# Patient Record
Sex: Female | Born: 1955 | Race: White | Hispanic: No | Smoking: Never smoker
Health system: Southern US, Community
[De-identification: ages and names within clinical notes are randomized; demographics above are authoritative.]

## PROBLEM LIST (undated history)

## (undated) DIAGNOSIS — F32A Depression, unspecified: Secondary | ICD-10-CM

## (undated) DIAGNOSIS — I10 Essential (primary) hypertension: Secondary | ICD-10-CM

## (undated) DIAGNOSIS — E079 Disorder of thyroid, unspecified: Secondary | ICD-10-CM

## (undated) DIAGNOSIS — IMO0002 Reserved for concepts with insufficient information to code with codable children: Secondary | ICD-10-CM

## (undated) DIAGNOSIS — T7840XA Allergy, unspecified, initial encounter: Secondary | ICD-10-CM

## (undated) DIAGNOSIS — F329 Major depressive disorder, single episode, unspecified: Secondary | ICD-10-CM

## (undated) DIAGNOSIS — F419 Anxiety disorder, unspecified: Secondary | ICD-10-CM

## (undated) DIAGNOSIS — E039 Hypothyroidism, unspecified: Secondary | ICD-10-CM

## (undated) DIAGNOSIS — K219 Gastro-esophageal reflux disease without esophagitis: Secondary | ICD-10-CM

## (undated) HISTORY — DX: Allergy, unspecified, initial encounter: T78.40XA

## (undated) HISTORY — DX: Depression, unspecified: F32.A

## (undated) HISTORY — DX: Major depressive disorder, single episode, unspecified: F32.9

## (undated) HISTORY — DX: Anxiety disorder, unspecified: F41.9

## (undated) HISTORY — PX: WRIST SURGERY: SHX841

## (undated) HISTORY — PX: TUBAL LIGATION: SHX77

## (undated) HISTORY — DX: Disorder of thyroid, unspecified: E07.9

## (undated) HISTORY — PX: ABDOMINAL HYSTERECTOMY: SHX81

---

## 2001-07-13 ENCOUNTER — Ambulatory Visit (HOSPITAL_COMMUNITY): Admission: RE | Admit: 2001-07-13 | Discharge: 2001-07-13 | Payer: Self-pay | Admitting: Family Medicine

## 2001-07-13 ENCOUNTER — Encounter: Payer: Self-pay | Admitting: Family Medicine

## 2002-02-04 ENCOUNTER — Emergency Department (HOSPITAL_COMMUNITY): Admission: EM | Admit: 2002-02-04 | Discharge: 2002-02-04 | Payer: Self-pay

## 2003-07-04 ENCOUNTER — Ambulatory Visit (HOSPITAL_COMMUNITY): Admission: RE | Admit: 2003-07-04 | Discharge: 2003-07-04 | Payer: Self-pay | Admitting: Family Medicine

## 2003-07-04 ENCOUNTER — Encounter: Payer: Self-pay | Admitting: Family Medicine

## 2003-11-26 ENCOUNTER — Emergency Department (HOSPITAL_COMMUNITY): Admission: EM | Admit: 2003-11-26 | Discharge: 2003-11-26 | Payer: Self-pay | Admitting: Emergency Medicine

## 2003-12-06 ENCOUNTER — Emergency Department (HOSPITAL_COMMUNITY): Admission: EM | Admit: 2003-12-06 | Discharge: 2003-12-06 | Payer: Self-pay | Admitting: Emergency Medicine

## 2004-07-22 ENCOUNTER — Ambulatory Visit (HOSPITAL_COMMUNITY): Admission: RE | Admit: 2004-07-22 | Discharge: 2004-07-22 | Payer: Self-pay | Admitting: Family Medicine

## 2004-10-04 ENCOUNTER — Emergency Department (HOSPITAL_COMMUNITY): Admission: EM | Admit: 2004-10-04 | Discharge: 2004-10-05 | Payer: Self-pay | Admitting: *Deleted

## 2004-11-11 ENCOUNTER — Ambulatory Visit (HOSPITAL_COMMUNITY): Admission: RE | Admit: 2004-11-11 | Discharge: 2004-11-11 | Payer: Self-pay | Admitting: Family Medicine

## 2005-02-03 ENCOUNTER — Other Ambulatory Visit: Admission: RE | Admit: 2005-02-03 | Discharge: 2005-02-03 | Payer: Self-pay | Admitting: Obstetrics and Gynecology

## 2005-02-19 ENCOUNTER — Ambulatory Visit (HOSPITAL_COMMUNITY): Admission: RE | Admit: 2005-02-19 | Discharge: 2005-02-19 | Payer: Self-pay | Admitting: Obstetrics and Gynecology

## 2005-09-15 ENCOUNTER — Ambulatory Visit (HOSPITAL_COMMUNITY): Admission: RE | Admit: 2005-09-15 | Discharge: 2005-09-15 | Payer: Self-pay | Admitting: Internal Medicine

## 2005-09-15 ENCOUNTER — Ambulatory Visit: Payer: Self-pay | Admitting: Internal Medicine

## 2006-04-03 ENCOUNTER — Emergency Department (HOSPITAL_COMMUNITY): Admission: EM | Admit: 2006-04-03 | Discharge: 2006-04-03 | Payer: Self-pay | Admitting: Emergency Medicine

## 2006-04-12 ENCOUNTER — Encounter (INDEPENDENT_AMBULATORY_CARE_PROVIDER_SITE_OTHER): Payer: Self-pay | Admitting: Specialist

## 2006-04-12 ENCOUNTER — Inpatient Hospital Stay (HOSPITAL_COMMUNITY): Admission: RE | Admit: 2006-04-12 | Discharge: 2006-04-13 | Payer: Self-pay | Admitting: Obstetrics and Gynecology

## 2006-09-13 ENCOUNTER — Ambulatory Visit (HOSPITAL_COMMUNITY): Admission: RE | Admit: 2006-09-13 | Discharge: 2006-09-13 | Payer: Self-pay | Admitting: Family Medicine

## 2007-02-15 ENCOUNTER — Encounter (HOSPITAL_COMMUNITY): Admission: RE | Admit: 2007-02-15 | Discharge: 2007-03-17 | Payer: Self-pay | Admitting: Family Medicine

## 2007-03-01 ENCOUNTER — Ambulatory Visit (HOSPITAL_COMMUNITY): Admission: RE | Admit: 2007-03-01 | Discharge: 2007-03-01 | Payer: Self-pay | Admitting: Family Medicine

## 2007-03-23 ENCOUNTER — Encounter (HOSPITAL_COMMUNITY): Admission: RE | Admit: 2007-03-23 | Discharge: 2007-04-22 | Payer: Self-pay | Admitting: Family Medicine

## 2007-05-05 ENCOUNTER — Ambulatory Visit (HOSPITAL_COMMUNITY): Admission: RE | Admit: 2007-05-05 | Discharge: 2007-05-05 | Payer: Self-pay | Admitting: Family Medicine

## 2007-11-30 ENCOUNTER — Ambulatory Visit (HOSPITAL_COMMUNITY): Admission: RE | Admit: 2007-11-30 | Discharge: 2007-11-30 | Payer: Self-pay | Admitting: Family Medicine

## 2007-12-28 ENCOUNTER — Emergency Department (HOSPITAL_COMMUNITY): Admission: EM | Admit: 2007-12-28 | Discharge: 2007-12-28 | Payer: Self-pay | Admitting: Emergency Medicine

## 2008-01-19 ENCOUNTER — Ambulatory Visit (HOSPITAL_COMMUNITY): Admission: RE | Admit: 2008-01-19 | Discharge: 2008-01-19 | Payer: Self-pay | Admitting: Family Medicine

## 2009-01-31 ENCOUNTER — Ambulatory Visit (HOSPITAL_COMMUNITY): Admission: RE | Admit: 2009-01-31 | Discharge: 2009-01-31 | Payer: Self-pay | Admitting: Obstetrics and Gynecology

## 2009-07-06 ENCOUNTER — Emergency Department (HOSPITAL_COMMUNITY): Admission: EM | Admit: 2009-07-06 | Discharge: 2009-07-07 | Payer: Self-pay | Admitting: Emergency Medicine

## 2009-08-15 ENCOUNTER — Ambulatory Visit (HOSPITAL_COMMUNITY): Admission: RE | Admit: 2009-08-15 | Discharge: 2009-08-15 | Payer: Self-pay | Admitting: Family Medicine

## 2009-11-14 ENCOUNTER — Ambulatory Visit (HOSPITAL_COMMUNITY): Admission: RE | Admit: 2009-11-14 | Discharge: 2009-11-14 | Payer: Self-pay | Admitting: Family Medicine

## 2010-03-04 ENCOUNTER — Emergency Department (HOSPITAL_COMMUNITY): Admission: EM | Admit: 2010-03-04 | Discharge: 2010-03-04 | Payer: Self-pay | Admitting: Emergency Medicine

## 2010-04-10 ENCOUNTER — Ambulatory Visit (HOSPITAL_COMMUNITY)
Admission: RE | Admit: 2010-04-10 | Discharge: 2010-04-10 | Payer: Self-pay | Source: Home / Self Care | Admitting: Family Medicine

## 2010-10-03 ENCOUNTER — Ambulatory Visit (HOSPITAL_COMMUNITY)
Admission: RE | Admit: 2010-10-03 | Discharge: 2010-10-03 | Payer: Self-pay | Source: Home / Self Care | Attending: Family Medicine | Admitting: Family Medicine

## 2010-10-19 ENCOUNTER — Encounter: Payer: Self-pay | Admitting: Family Medicine

## 2010-11-26 ENCOUNTER — Encounter: Payer: Self-pay | Admitting: Orthopedic Surgery

## 2010-12-10 ENCOUNTER — Ambulatory Visit: Payer: Self-pay | Admitting: Orthopedic Surgery

## 2010-12-15 LAB — POCT CARDIAC MARKERS
CKMB, poc: 1.6 ng/mL (ref 1.0–8.0)
Troponin i, poc: <0.05 ng/mL (ref 0.00–0.09)
Troponin i, poc: <0.05 ng/mL (ref 0.00–0.09)

## 2010-12-15 LAB — BASIC METABOLIC PANEL
BUN: 12 mg/dL (ref 6–23)
CO2: 26 mEq/L (ref 19–32)
Chloride: 103 mEq/L (ref 96–112)
Creatinine, Ser: 0.64 mg/dL (ref 0.4–1.2)
Potassium: 3 mEq/L — ABNORMAL LOW (ref 3.5–5.1)

## 2010-12-15 LAB — D-DIMER, QUANTITATIVE: D-Dimer, Quant: 0.42 ug/mL-FEU (ref 0.00–0.48)

## 2010-12-15 LAB — DIFFERENTIAL
Basophils Relative: 1 % (ref 0–1)
Eosinophils Absolute: 0.1 10*3/uL (ref 0.0–0.7)
Eosinophils Relative: 2 % (ref 0–5)
Lymphs Abs: 2.2 10*3/uL (ref 0.7–4.0)
Monocytes Relative: 7 % (ref 3–12)
Neutrophils Relative %: 64 % (ref 43–77)

## 2010-12-15 LAB — CBC
HCT: 38.1 % (ref 36.0–46.0)
MCHC: 34.2 g/dL (ref 30.0–36.0)
MCV: 86.1 fL (ref 78.0–100.0)
Platelets: 285 10*3/uL (ref 150–400)
WBC: 8.3 10*3/uL (ref 4.0–10.5)

## 2011-02-10 NOTE — Procedures (Signed)
NAMEMEGANNE, RITA NO.:  [id number]   MEDICAL RECORD NO.:  [id number]          PATIENT TYPE:  OUT   LOCATION:  DFTL                          FACILITY:  [facility]   PHYSICIAN:  Donna Bernard, M.D.DATE OF BIRTH:  05/04/1956   DATE OF PROCEDURE:  01/19/2008  DATE OF DISCHARGE:  01/19/2008                                  STRESS TEST   INDICATION FOR TEST:  This patient is a 55 year old white female with a  history of hypertension who has had some atypical chest discomfort.  Stress test was done at standard [facility] protocol.  Resting EKG revealed  normal sinus rhythm with no significant ST-T changes.  The patient  tolerated the tests relatively well.  After the first stage, the patient  had a slight shortness of breath.  There were no significant ST-segment  changes at this point.  By the completion of the third stage, the  patient was feeling short of breath.   INTERPRETATION:  The EKG is somewhat challenged by some artifact;  however, utilizing multiple EKGs, we did see enough of the ST-segment to  feel confident of our results.  The patient reached a maximum heart rate  of 150.  This surpassed with sub-max predicted heart rate of 142.  At  0.08 seconds passage from the J-point, there was only nonspecific ST  segments changes noted with depression, generally less than a  millimeter.  The ST-segment remained sharply ascending in these areas  too.   IMPRESSION:  Negative adequate stress test.   PLAN:  The patient is encouraged to get into some regular exercise and  followup in the office for other chronic concerns.      Donna Bernard, M.D.  Electronically Signed     WSL/MEDQ  D:  02/22/2008  T:  02/23/2008  Job:  [id number]

## 2011-02-13 NOTE — H&P (Signed)
Jessica Kirby               ACCOUNT NO.:  [id number]   MEDICAL RECORD NO.:  [id number]          PATIENT TYPE:  AMB   LOCATION:  DAY                           FACILITY:  [facility]   PHYSICIAN:  Tilda Burrow, M.D. DATE OF BIRTH:  1956/05/03   DATE OF ADMISSION:  DATE OF DISCHARGE:  LH                                HISTORY & PHYSICAL   ADMITTING DIAGNOSES:  1.  Heavy and prolonged menses.  2.  First degree uterine descensus, uterine retroversion.  3.  Requesting endometrial ablation.   HISTORY OF PRESENT ILLNESS:  This 55 year old female is still premenopausal  is admitted for endometrial ablation.  Jessica Kirby has been seen in our office.  She was referred courtesy of Eber Jones  _ of [facility] for  evaluation for heavy bleeding and anemia secondary to the heavy bleeding.  She had a uterus that on ultrasound measured 8.3 x 4.7 x 5.3-cm, with a 5-mm  endometrial stripe, no focal uterine lesions.  An endometrial biopsy was  performed that shows benign endometrial tissue, no hyperplasia or  malignancy.  We have seen her, evaluated her, and discussed treatment  options including the option of doing nothing, attempting hormone therapy,  as well as endometrial ablation.  The vaginal hysterectomy was also reviewed  and the patient's preferred method of therapy at this time is endometrial  ablation.  She had been using a CombiPatch in a continuous fashion in the  past without optimal success.  She has a documented hemoglobin of 10.1 while  on iron therapy, currently it is improved to 11.1.   PAST MEDICAL HISTORY:  Benign.   SURGICAL HISTORY:  Tubal ligation.   INJURIES:  Broken wrist, 1999.   ALLERGIES:  1.  SULFA DRUGS.  2.  HCTZ.  3.  FLUID PILL.  4.  Prudy Feeler.   MEDICATIONS:  CombiPatch taken in the past, Actonel, and calcium are  currently being used.   PHYSICAL EXAMINATION:  VITAL SIGNS:  Height 5 foot 2 inches, weight 170.  GENERAL:  Shows a healthy-appearing  Caucasian female, alert, oriented x 3.  HEENT:  Pupils equal, round, reactive.  Extraocular movements intact.  NECK:  Supple.  Trachea midline.  CARDIOVASCULAR:  Unremarkable.  ABDOMEN:  Soft without masses.  EXTERNAL GENITALIA:  Normal.  PELVIC:  Vaginal exam shows normal secretions.  Cervix:  Bulbous,  multiparous.  Uterus is retroverted.  Sounds to 8-cm on the endometrial  biopsy with heavy bleeding noted upon procedure.   IMPRESSION:  1.  Heavy and prolonged menses.  2.  First degree uterine descensus.   PLAN:  1.  Hysteroscopy.  2.  Dilatation and curettage.  3.  Endometrial ablation, Feb 19, 2005.      JVF/MEDQ  D:  02/18/2005  T:  02/18/2005  Job:  [id number]   cc:   Tilda Burrow, M.D.  Fax: [phone]

## 2011-02-13 NOTE — Op Note (Signed)
NAMEKRISLYN, Jessica Kirby               ACCOUNT NO.:  [id number]   MEDICAL RECORD NO.:  [id number]          PATIENT TYPE:  AMB   LOCATION:  DAY                           FACILITY:  [facility]   PHYSICIAN:  R. Roetta Sessions, M.D. DATE OF BIRTH:  [birth year]   DATE OF PROCEDURE:  09/15/2005  DATE OF DISCHARGE:                                 OPERATIVE REPORT   PROCEDURE:  Colonoscopy screening.   INDICATIONS FOR PROCEDURE:  The patient is a 55 year old lady referred out  of courtesy of Dr. Lubertha South for colorectal cancer screening. She is  devoid of any lower GI tract symptoms. She has never had her lower GI tract  imaged. There is no family history of colorectal cancer. Colonoscopy is now  being done as a standard screening maneuver. This approach has been  discussed with the patient at length. Potential risks, benefits, and  alternatives have been reviewed and questions answered. She is agreeable.  Please see documentation in the medical record.   PROCEDURE NOTE:  O2 saturation, blood pressure, pulse, and respirations were  monitored throughout the entire procedure. Conscious sedation with Versed 5  mg IV and Demerol 75 mg IV in divided doses.   INSTRUMENT:  Olympus video chip system.   FINDINGS:  Digital rectal exam revealed no abnormalities.   ENDOSCOPIC FINDINGS:  Prep was good.   Rectum:  Examination of the rectal mucosa including retroflexed view of the  anal verge revealed anal papilla and internal hemorrhoids only.   Colon:  Colonic mucosa was surveyed from the rectosigmoid junction through  the left, transverse, and right colon to the area of the appendiceal  orifice, ileocecal valve, and cecum. These structures were well seen and  photographed for the record. From this level, the scope was slowly  withdrawn, and all previously mentioned mucosal surfaces were again seen.  The colonic mucosa appeared normal. The patient tolerated the procedure well  and was reactive to  endoscopy.   IMPRESSION:  Normal colon. Internal hemorrhoids and anal papilla; otherwise  normal rectum.   RECOMMENDATIONS:  Repeat screening colonoscopy in 10 years.      Jonathon Bellows, M.D.  Electronically Signed     RMR/MEDQ  D:  09/15/2005  T:  09/16/2005  Job:  [id number]   cc:   Donna Bernard, M.D.  Fax: [phone]

## 2011-02-13 NOTE — Op Note (Signed)
Jessica Kirby, Jessica Kirby               ACCOUNT NO.:  [id number]   MEDICAL RECORD NO.:  [id number]          PATIENT TYPE:  AMB   LOCATION:  DAY                           FACILITY:  [facility]   PHYSICIAN:  Tilda Burrow, M.D. DATE OF BIRTH:  1955/11/16   DATE OF PROCEDURE:  02/19/2005  DATE OF DISCHARGE:                                 OPERATIVE REPORT   PREOPERATIVE DIAGNOSES:  1.  Menorrhagia.  2.  First degree uterine descensus.   POSTOPERATIVE DIAGNOSES:  1.  Menorrhagia.  2.  First degree uterine descensus.   OPERATION/PROCEDURE:  1.  Hysteroscopy.  2.  Dilatation and curettage.  3.  Endometrial ablation.   SURGEON:  Tilda Burrow, M.D.   ASSISTANT:  None.   ANESTHESIA:  General with laryngeal mask anesthesia.   COMPLICATIONS:  None.   FINDINGS:  Smooth uterine cavity without visible abnormalities.  Uterus  sounding to 9 cm.   DESCRIPTION OF PROCEDURE:  The patient was taken to the operating room,  prepped and draped in the for vaginal procedure.  Cervix was grasped with a  single-tooth tenaculum and uterus sounded to 9 cm.  Dilated to 23-French  which allowed direct introduction of the HTA hysteroscope.  The endometrial  cavity was visualized and photo was taken.  Both tubal ostia were  identified. Brief curettage was then performed and then we proceeded with  reinsertion of the HTA hysteroscope and identified the upper aspects of the  lower uterine segment.  The endometrial cavity was then ablated with a  thermal 10-minute process at 90 degrees C resulting in satisfactory thermal  changes.  Pre and post procedure photos were taken.  The procedure was  successfully ablated without any fluid loss.  The  patient then received a 20 mL Marcaine paracervical block, went to the  recovery room where IV Toradol 30 mg was administered.   She was sent home with Toradol tablets and Tylox. Follow up in two weeks in  our office.      JVF/MEDQ  D:  02/19/2005  T:  02/20/2005   Job:  [id number]

## 2011-02-13 NOTE — Discharge Summary (Signed)
NAMENALAYSIA, MANGANIELLO NO.:  [id number]   MEDICAL RECORD NO.:  [id number]          PATIENT TYPE:  INP   LOCATION:  A428                          FACILITY:  [facility]   PHYSICIAN:  Tilda Burrow, M.D. DATE OF BIRTH:  [birth year]   DATE OF ADMISSION:  04/12/2006  DATE OF DISCHARGE:  07/17/2007LH                                 DISCHARGE SUMMARY   ADMITTING DIAGNOSES:  1.  Menorrhagia.  2.  Uterine descensus.  3.  Pelvic pain.   DISCHARGE DIAGNOSES:  1.  Menorrhagia.  2.  Uterine descensus.  3.  Pelvic pain.  4.  Adenomyosis.  5.  Uterine fibrosis.  6.  Rectocele.   OPERATION PERFORMED:  1.  Laparoscopically assisted vaginal hysterectomy.  2.  Bilateral salpingo-oophorectomy.  3.  Posterior repair; surgeons were Tilda Burrow, M.D. and Bernerd Limbo.      Destefano, M.D.   SUMMARY:  This 55 year old female was admitted as described in the HPI with  indications specifically significant for the pelvic discomfort persistent  since her endometrial ablation performed Feb 19, 2005.  She had laxity of  the posterior wall status post obstetrical trauma and wished to have this  fixed as well.   The patient underwent LAVH, BSO and posterior repair with a 175-mL blood  loss on April 13, 2006.  Anesthesia time was 2.5 hours with operative time 1  hour 50 minutes.   Postoperatively the patient had an excellent first day in the hospital and  was stable for discharge at that point.  Postoperative hemoglobin was 11.5  and hematocrit 33.0 compared to an admitting hemoglobin of 13.4 and  hematocrit 38.  She did not require transfusions.   DISCHARGE INSTRUCTIONS:  The patient was discharged with some instructions  including no lifting or driving time two weeks.  Pelvic rest times one  month.   FOLLOW UP:  The patient is to follow up in five days and then four weeks at  [facility].      Tilda Burrow, M.D.  Electronically Signed     JVF/MEDQ  D:  04/29/2006   T:  04/30/2006  Job:  [id number]

## 2011-02-13 NOTE — H&P (Signed)
NAMECONNI, Jessica Kirby               ACCOUNT NO.:  [id number]   MEDICAL RECORD NO.:  [id number]          PATIENT TYPE:  AMB   LOCATION:  DAY                           FACILITY:  [facility]   PHYSICIAN:  Tilda Burrow, M.D. DATE OF BIRTH:  [birth year]   DATE OF ADMISSION:  DATE OF DISCHARGE:  LH                                HISTORY & PHYSICAL   PREOPERATIVE DIAGNOSES:  1.  Menorrhagia.  2.  Uterine descensus.  3.  Pelvic pain.   HISTORY OF PRESENT ILLNESS:  This 55 year old female was admitted for  hysterectomy and removal of tubes and ovaries with posterior vaginal repair  due to pelvic pain and discomfort that has persisted status post endometrial  ablation.  We performed endometrial ablation on Jessica Kirby 25, 2006.  The  uterus has remained tender and uncomfortable.  Ultrasound shows no pathology  and a thin endometrial stripe.  There is a mobile uterus with no suspected  pelvic adhesions.  There is a nonpurulent cervix.  First-degree uterine  descensus is present.  Nonetheless,  the patient has persistent discomfort  with uterine contact with vaginal probe reproducing the pain in the pelvis  in the left lower quadrant.  Plans are to remove tubes and ovaries due to  patient age and the adnexal discomfort.  She has no GI history of bowel  problems to suspect colon disease.  Additionally, she does have laxity of  the posterior wall status post obstetric trauma, and this will be repaired  at that time.   PAST MEDICAL HISTORY:  1.  Benign surgical history.  2.  Endometrial ablation in 2006.  3.  Tubal ligation in the past.  4.  Broken wrist in 1999.   ALLERGIES:  SULFA DRUGS, HCTZ, XANAX, ACTONEL.   MEDICATIONS:  1.  __________  2.  Calcium.  3.  Bupropion XL 300 mg daily.   PHYSICAL EXAMINATION:  GENERAL:  A healthy Caucasian female.  VITAL SIGNS:  Temperature 97.8, pulse 86, respirations 20, blood pressure  160/97.  HEENT:  Pupils are equal, round, and reactive.  Extraocular  movements  intact.  NECK:  Supple.  CARDIOVASCULAR:  Unremarkable.  ABDOMEN:  Nontender, without masses.  PELVIC:  External genitalia with multiparous uterus, retroverted with  previously noted thin endometrial stripe status  post ablation.  Uterine tenderness on contact is debilitating to patient and  persistent source of pelvic pain.  Adnexa is mildly tender on the left side  with uterine contact.  Rectocele present intermittently, symptomatic.   PLAN:  LAVH, BSO, and posterior repair on April 12, 2006.      Tilda Burrow, M.D.  Electronically Signed     JVF/MEDQ  D:  04/09/2006  T:  04/09/2006  Job:  [id number]

## 2011-02-13 NOTE — Op Note (Signed)
Jessica Kirby, MARINES NO.:  [id number]   MEDICAL RECORD NO.:  [id number]          PATIENT TYPE:  INP   LOCATION:  A428                          FACILITY:  [facility]   PHYSICIAN:  Tilda Burrow, M.D. DATE OF BIRTH:  1956-05-11   DATE OF PROCEDURE:  04/12/2006  DATE OF DISCHARGE:  04/13/2006                                 OPERATIVE REPORT   PREOPERATIVE DIAGNOSES:  1.  Menorrhagia.  2.  Uterus descensus.  3.  Pelvic pain.   POSTOPERATIVE DIAGNOSES:  1.  Menorrhagia.  2.  Uterus descensus.  3.  Pelvic pain.   PROCEDURES:  1.  Laparoscopically assisted vaginal hysterectomy.  2.  Bilateral salpingo-oophorectomy.  3.  Posterior repair.   SURGEON:  Dr. Emelda Fear.   ASSISTANT:  Dr. Leona Carry.   ANESTHESIA:  General.   SPECIMENS:  Uterus, rectocele, vaginal mucosa to lab.   ESTIMATED BLOOD LOSS:  175 cc.   DETAILS OF PROCEDURE:  Patient was taken to the operating room, prepped and  draped for combined abdominal and vaginal procedure with single toothed  tenaculum used to grasp the cervix long enough to put a Hulka tenaculum in  place.  Foley catheter was in place.  Legs were in low lithotomy yellow  fin leg supports.  Abdomen was prepped and draped.  Infraumbilical vertical  1 cm skin incision was made below the transverse suprapubic incision, and  right and left lower quadrant incisions as well.  Veress needles were used  through the umbilicus to achieve pneumoperitoneum.  Position confirmed with  water droplet test, and insufflated to 2 liters CO2.  Laparoscopic trocar  was introduced, allowing, under direct visualization, and 10-mm camera used  to visualize the abdomen, confirming no evidence of intra-abdominal trauma.  Suprapubic trocar was introduced under direct visualization, a 5-mm trocar.  Left and right adnexal trocars were 5-mm, place directly, being careful to  avoid the inferior epigastric vessels.  Attention was directed to the pelvis  with  the bowel elevated by placing the patient in Trendelenburg position and  manipulating the bowel carefully.  Uterus could be elevated and round  ligaments taken down on either side with harmonic scalpel.  The adnexal  structures on the right were elevated, taken down by clamping with the  harmonic scalpel and taking small bites under low power dissection to allow  for optimal coagulation of the pedicle.  The tube and ovary were released  easily and with minimal blood loss.  Bladder flap was then developed on the  anterior lower uterine segment, using harmonic scalpel as dissector and  transector of the bladder flap.  On the patient's left side a similar  technique was used.   Once we had removed all the broad ligament tissues down to the uterine  vessels, we used low power settings on the harmonic scalpel to coagulate the  ascending uterine arteries on either side of the uterus, but did not  transect them, using 5 second bursts of low wattage coagulation.  At this  time hemostasis was good and the abdominal portion of the procedure was  considered completed.  Posterior portions of the procedure then followed.  The surgeon moved to the  vaginal position, elevated the legs into a high lithotomy position, with Dr.  Leona Carry continuing as assistant.  Lateral retractor allowed access to the  vagina.  Posterior colpotomy incision was performed over a weighted  speculum, and the uterosacral ligaments on either side clamped, cut and  taken down.  The anterior cervical vaginal fornix was entered by Bovie  cautery and elevation of the bladder flap.  The lower cardinal ligaments  were then clamped, cut and suture ligated on either side.  Blunt dissection  was used to complete entry of the anterior vesicouterine peritoneal  reflection, and then we were able to grasp the upper cardinal ligaments  serially in small bites, clamping, cutting and suture ligating on either  side until the inferior  pedicles had been transected where they joined the  upper laparoscopic portion of the case.  Hemostasis was quite good.  Sponge  stick was used to identify pedicles as hemostatic.  Peritoneum was pulled  down over the vaginal cuff, tacked in place, and then two stitches pulling  the uterosacral ligament remnants to the lateral vaginal angle were  performed to improve vaginal apex support.  The remaining cuff was closed in  a transverse running fashion.  Postsurgical results were hemostatically  excellent and appeared to give good smooth uniform support.   POSTERIOR REPAIR:  Posterior perineal repair was performed by grasping the  perineal body at the anal sphincter, with two Allis clamps, then splitting  the vaginal mucosa one-half way up the posterior vagina and laterally  dissecting beneath the vaginal mucosa.  At this time a double gloved right  index finger was placed in the rectum to identify the rectal tissues, and  pararectal tissues grasped from above the rectocele, pulled into the  midline, grasping strong enough tissues on each side that perineal body was  dramatically improved in its reinforcement.  The rectal tissues were  actually sutured upward as part of this procedure.  The subsequent result  was a reinforced perineal body over the lower one-half of the vagina with  good tissue support and rectal diameter.  The suturing was performed while  maintaining the index finger in the rectum and allowing the assistant to tie  the sutures.  At this point the vaginal mucosa was trimmed slightly,  reapproximated in the midline, and hemostasis was sufficiently good that no  vaginal packing was considered necessary.  The patient went to the recovery  room in good condition.  Sponge and needle counts were correct.      Tilda Burrow, M.D.  Electronically Signed     JVF/MEDQ  D:  04/29/2006  T:  04/30/2006  Job:  [id number]

## 2011-04-23 ENCOUNTER — Other Ambulatory Visit: Payer: Self-pay | Admitting: Family Medicine

## 2011-04-23 ENCOUNTER — Ambulatory Visit (HOSPITAL_COMMUNITY)
Admission: RE | Admit: 2011-04-23 | Discharge: 2011-04-23 | Disposition: A | Discharge status: Home / Self Care | Payer: 59 | Source: Ambulatory Visit | Attending: Family Medicine | Admitting: Family Medicine

## 2011-04-23 DIAGNOSIS — R52 Pain, unspecified: Secondary | ICD-10-CM

## 2011-04-23 DIAGNOSIS — S9032XA Contusion of left foot, initial encounter: Secondary | ICD-10-CM

## 2011-04-23 DIAGNOSIS — M79609 Pain in unspecified limb: Secondary | ICD-10-CM | POA: Insufficient documentation

## 2011-04-23 DIAGNOSIS — X58XXXA Exposure to other specified factors, initial encounter: Secondary | ICD-10-CM | POA: Insufficient documentation

## 2011-04-23 DIAGNOSIS — S9030XA Contusion of unspecified foot, initial encounter: Secondary | ICD-10-CM | POA: Insufficient documentation

## 2011-04-23 DIAGNOSIS — S90129A Contusion of unspecified lesser toe(s) without damage to nail, initial encounter: Secondary | ICD-10-CM

## 2011-04-28 ENCOUNTER — Encounter (HOSPITAL_COMMUNITY): Payer: 59

## 2011-06-23 LAB — DIFFERENTIAL
Lymphocytes Relative: 33
Lymphs Abs: 2.8
Monocytes Relative: 7
Neutro Abs: 4.8
Neutrophils Relative %: 57

## 2011-06-23 LAB — CBC
Platelets: 277
RBC: 4.8
WBC: 8.5

## 2011-06-23 LAB — BASIC METABOLIC PANEL
BUN: 6
Creatinine, Ser: 0.75
GFR calc Af Amer: >60
GFR calc non Af Amer: >60

## 2011-06-23 LAB — POCT CARDIAC MARKERS
CKMB, poc: 1.5
Troponin i, poc: <0.05

## 2011-07-03 ENCOUNTER — Other Ambulatory Visit: Payer: Self-pay | Admitting: Family Medicine

## 2011-07-03 DIAGNOSIS — R51 Headache: Secondary | ICD-10-CM

## 2011-07-03 DIAGNOSIS — Z139 Encounter for screening, unspecified: Secondary | ICD-10-CM

## 2011-07-06 ENCOUNTER — Ambulatory Visit (HOSPITAL_COMMUNITY)
Admission: RE | Admit: 2011-07-06 | Discharge: 2011-07-06 | Disposition: A | Discharge status: Home / Self Care | Payer: 59 | Source: Ambulatory Visit | Attending: Family Medicine | Admitting: Family Medicine

## 2011-07-06 DIAGNOSIS — Z139 Encounter for screening, unspecified: Secondary | ICD-10-CM

## 2011-07-06 DIAGNOSIS — Z1231 Encounter for screening mammogram for malignant neoplasm of breast: Secondary | ICD-10-CM | POA: Insufficient documentation

## 2011-07-09 ENCOUNTER — Ambulatory Visit (HOSPITAL_COMMUNITY)
Admission: RE | Admit: 2011-07-09 | Discharge: 2011-07-09 | Disposition: A | Discharge status: Home / Self Care | Payer: 59 | Source: Ambulatory Visit | Attending: Family Medicine | Admitting: Family Medicine

## 2011-07-09 DIAGNOSIS — R51 Headache: Secondary | ICD-10-CM | POA: Insufficient documentation

## 2011-07-15 ENCOUNTER — Other Ambulatory Visit: Payer: Self-pay | Admitting: Family Medicine

## 2011-07-15 ENCOUNTER — Ambulatory Visit (HOSPITAL_COMMUNITY)
Admission: RE | Admit: 2011-07-15 | Discharge: 2011-07-15 | Disposition: A | Discharge status: Home / Self Care | Payer: 59 | Source: Ambulatory Visit | Attending: Family Medicine | Admitting: Family Medicine

## 2011-07-15 DIAGNOSIS — M5137 Other intervertebral disc degeneration, lumbosacral region: Secondary | ICD-10-CM | POA: Insufficient documentation

## 2011-07-15 DIAGNOSIS — M545 Low back pain, unspecified: Secondary | ICD-10-CM

## 2011-07-15 DIAGNOSIS — M51379 Other intervertebral disc degeneration, lumbosacral region without mention of lumbar back pain or lower extremity pain: Secondary | ICD-10-CM | POA: Insufficient documentation

## 2011-07-15 DIAGNOSIS — M25559 Pain in unspecified hip: Secondary | ICD-10-CM | POA: Insufficient documentation

## 2011-07-15 DIAGNOSIS — M25552 Pain in left hip: Secondary | ICD-10-CM

## 2011-08-03 ENCOUNTER — Other Ambulatory Visit: Payer: Self-pay | Admitting: Family Medicine

## 2011-08-03 DIAGNOSIS — M549 Dorsalgia, unspecified: Secondary | ICD-10-CM

## 2011-08-03 DIAGNOSIS — M25552 Pain in left hip: Secondary | ICD-10-CM

## 2011-08-10 ENCOUNTER — Ambulatory Visit (HOSPITAL_COMMUNITY)
Admission: RE | Admit: 2011-08-10 | Discharge: 2011-08-10 | Disposition: A | Discharge status: Home / Self Care | Payer: 59 | Source: Ambulatory Visit | Attending: Family Medicine | Admitting: Family Medicine

## 2011-08-10 DIAGNOSIS — M5126 Other intervertebral disc displacement, lumbar region: Secondary | ICD-10-CM | POA: Insufficient documentation

## 2011-08-10 DIAGNOSIS — M549 Dorsalgia, unspecified: Secondary | ICD-10-CM

## 2011-08-10 DIAGNOSIS — M5137 Other intervertebral disc degeneration, lumbosacral region: Secondary | ICD-10-CM | POA: Insufficient documentation

## 2011-08-10 DIAGNOSIS — M545 Low back pain, unspecified: Secondary | ICD-10-CM | POA: Insufficient documentation

## 2011-08-10 DIAGNOSIS — M25559 Pain in unspecified hip: Secondary | ICD-10-CM | POA: Insufficient documentation

## 2011-08-10 DIAGNOSIS — M25552 Pain in left hip: Secondary | ICD-10-CM

## 2011-08-10 DIAGNOSIS — M51379 Other intervertebral disc degeneration, lumbosacral region without mention of lumbar back pain or lower extremity pain: Secondary | ICD-10-CM | POA: Insufficient documentation

## 2011-09-10 ENCOUNTER — Encounter: Payer: Self-pay | Admitting: Emergency Medicine

## 2011-09-10 ENCOUNTER — Emergency Department (HOSPITAL_COMMUNITY)
Admission: EM | Admit: 2011-09-10 | Discharge: 2011-09-10 | Disposition: A | Discharge status: Home / Self Care | Payer: 59 | Attending: Emergency Medicine | Admitting: Emergency Medicine

## 2011-09-10 DIAGNOSIS — IMO0002 Reserved for concepts with insufficient information to code with codable children: Secondary | ICD-10-CM | POA: Insufficient documentation

## 2011-09-10 DIAGNOSIS — M810 Age-related osteoporosis without current pathological fracture: Secondary | ICD-10-CM | POA: Insufficient documentation

## 2011-09-10 DIAGNOSIS — Z9851 Tubal ligation status: Secondary | ICD-10-CM | POA: Insufficient documentation

## 2011-09-10 DIAGNOSIS — Z9079 Acquired absence of other genital organ(s): Secondary | ICD-10-CM | POA: Insufficient documentation

## 2011-09-10 DIAGNOSIS — I10 Essential (primary) hypertension: Secondary | ICD-10-CM | POA: Insufficient documentation

## 2011-09-10 DIAGNOSIS — R011 Cardiac murmur, unspecified: Secondary | ICD-10-CM | POA: Insufficient documentation

## 2011-09-10 DIAGNOSIS — J111 Influenza due to unidentified influenza virus with other respiratory manifestations: Secondary | ICD-10-CM | POA: Insufficient documentation

## 2011-09-10 HISTORY — DX: Reserved for concepts with insufficient information to code with codable children: IMO0002

## 2011-09-10 HISTORY — DX: Essential (primary) hypertension: I10

## 2011-09-10 MED ORDER — PROMETHAZINE-CODEINE 6.25-10 MG/5ML PO SYRP: 5.0000 mL | ORAL_SOLUTION | Freq: Four times a day (QID) | ORAL | Status: AC | PRN

## 2011-09-10 NOTE — ED Provider Notes (Signed)
Medical screening examination/treatment/procedure(s) were performed by non-physician practitioner and as supervising physician I was immediately available for consultation/collaboration.  Donnetta Hutching, MD 09/10/11 2258

## 2011-09-10 NOTE — ED Notes (Signed)
Pt c/o ha/chills/congestion/sore throat since last night.

## 2011-09-10 NOTE — ED Provider Notes (Signed)
History     CSN: [id number] Arrival date & time: 09/10/2011  4:07 PM   First MD Initiated Contact with Patient 09/10/11 1611      Chief Complaint  Patient presents with  . Headache  . Chills    (Consider location/radiation/quality/duration/timing/severity/associated sxs/prior treatment) Patient is a 55 y.o. female presenting with headaches. The history is provided by the patient.  Headache  This is a new problem. The current episode started yesterday. The problem occurs hourly. Associated with: chills, fever and sore throat. The pain is located in the frontal and temporal region. The quality of the pain is described as sharp. The pain is moderate. Associated symptoms include a fever and malaise/fatigue. Pertinent negatives include no palpitations and no shortness of breath. Associated symptoms comments: Sinus drainage. She has tried nothing for the symptoms. The treatment provided no relief.    Past Medical History  Diagnosis Date  . Hypertension   . Herniated disc   . Osteoporosis     Past Surgical History  Procedure Date  . Tubal ligation   . Wrist surgery   . Abdominal hysterectomy     No family history on file.  History  Substance Use Topics  . Smoking status: Never Smoker   . Smokeless tobacco: Not on file  . Alcohol Use: No    OB History    Grav Para Term Preterm Abortions TAB SAB Ect Mult Living                  Review of Systems  Constitutional: Positive for fever and malaise/fatigue. Negative for activity change.       All ROS Neg except as noted in HPI  HENT: Negative for nosebleeds and neck pain.   Eyes: Negative for photophobia and discharge.  Respiratory: Negative for cough, shortness of breath and wheezing.   Cardiovascular: Negative for chest pain and palpitations.  Gastrointestinal: Negative for abdominal pain and blood in stool.  Genitourinary: Negative for dysuria, frequency and hematuria.  Musculoskeletal: Negative for back pain and  arthralgias.  Skin: Negative.   Neurological: Positive for headaches. Negative for dizziness, seizures and speech difficulty.  Psychiatric/Behavioral: Negative for hallucinations and confusion.    Allergies  Sulfa antibiotics  Home Medications  No current outpatient prescriptions on file.  BP 156/79  Pulse 107  Temp(Src) 98.4 F (36.9 C) (Oral)  Resp 20  Ht 5\' 3"  (1.6 m)  Wt 211 lb (95.709 kg)  BMI 37.38 kg/m2  SpO2 97%  Physical Exam  Nursing note and vitals reviewed. Constitutional: She is oriented to person, place, and time. She appears well-developed and well-nourished.  Non-toxic appearance.  HENT:  Head: Normocephalic.  Right Ear: Tympanic membrane and external ear normal.  Left Ear: Tympanic membrane and external ear normal.       Mild to mod nasal congestion.  Eyes: EOM and lids are normal. Pupils are equal, round, and reactive to light.  Neck: Normal range of motion. Neck supple. Carotid bruit is not present.  Cardiovascular: Normal rate, regular rhythm, intact distal pulses and normal pulses.   Murmur heard. Pulmonary/Chest: Breath sounds normal. No respiratory distress.       Mild cough with deep breathing  Abdominal: Soft. Bowel sounds are normal. There is no tenderness. There is no guarding.  Musculoskeletal: Normal range of motion.  Lymphadenopathy:       Head (right side): No submandibular adenopathy present.       Head (left side): No submandibular adenopathy present.    She  has no cervical adenopathy.  Neurological: She is alert and oriented to person, place, and time. She has normal strength. No cranial nerve deficit or sensory deficit.  Skin: Skin is warm and dry.  Psychiatric: She has a normal mood and affect. Her speech is normal.    ED Course  Procedures (including critical care time)  Labs Reviewed - No data to display No results found.   Dx.  Influenza   MDM  I have reviewed nursing notes, vital signs, and all appropriate lab and  imaging results for this patient.        Kathie Dike, [place] 09/10/11 [phone]

## 2011-09-14 ENCOUNTER — Emergency Department (HOSPITAL_COMMUNITY): Payer: 59

## 2011-09-14 ENCOUNTER — Encounter (HOSPITAL_COMMUNITY): Payer: Self-pay

## 2011-09-14 ENCOUNTER — Emergency Department (HOSPITAL_COMMUNITY)
Admission: EM | Admit: 2011-09-14 | Discharge: 2011-09-14 | Disposition: A | Discharge status: Home / Self Care | Payer: 59 | Attending: Emergency Medicine | Admitting: Emergency Medicine

## 2011-09-14 DIAGNOSIS — M6289 Other specified disorders of muscle: Secondary | ICD-10-CM | POA: Insufficient documentation

## 2011-09-14 DIAGNOSIS — J40 Bronchitis, not specified as acute or chronic: Secondary | ICD-10-CM | POA: Insufficient documentation

## 2011-09-14 DIAGNOSIS — I10 Essential (primary) hypertension: Secondary | ICD-10-CM | POA: Insufficient documentation

## 2011-09-14 DIAGNOSIS — M810 Age-related osteoporosis without current pathological fracture: Secondary | ICD-10-CM | POA: Insufficient documentation

## 2011-09-14 DIAGNOSIS — R059 Cough, unspecified: Secondary | ICD-10-CM | POA: Insufficient documentation

## 2011-09-14 DIAGNOSIS — R0602 Shortness of breath: Secondary | ICD-10-CM | POA: Insufficient documentation

## 2011-09-14 DIAGNOSIS — R070 Pain in throat: Secondary | ICD-10-CM | POA: Insufficient documentation

## 2011-09-14 DIAGNOSIS — Z79899 Other long term (current) drug therapy: Secondary | ICD-10-CM | POA: Insufficient documentation

## 2011-09-14 DIAGNOSIS — J3489 Other specified disorders of nose and nasal sinuses: Secondary | ICD-10-CM | POA: Insufficient documentation

## 2011-09-14 DIAGNOSIS — R05 Cough: Secondary | ICD-10-CM | POA: Insufficient documentation

## 2011-09-14 MED ORDER — AEROCHAMBER Z-STAT PLUS/MEDIUM MISC
1.0000 | Freq: Once | Status: AC
  Administered 2011-09-14: 1

## 2011-09-14 MED ORDER — ALBUTEROL SULFATE HFA 108 (90 BASE) MCG/ACT IN AERS
2.0000 | INHALATION_SPRAY | RESPIRATORY_TRACT | Status: DC | PRN
  Administered 2011-09-14: 2 via RESPIRATORY_TRACT
  Filled 2011-09-14: qty 6.7

## 2011-09-14 NOTE — ED Provider Notes (Addendum)
History     CSN: [id number] Arrival date & time: 09/14/2011 12:53 AM   First MD Initiated Contact with Patient 09/14/11 [phone]      Chief Complaint  Patient presents with  . Cough  . Shortness of Breath  . Nasal Congestion    (Consider location/radiation/quality/duration/timing/severity/associated sxs/prior treatment) Patient is a 55 y.o. female presenting with shortness of breath. The history is provided by the patient.  Shortness of Breath  The current episode started 2 days ago. The problem has been gradually worsening. The problem is mild. The symptoms are relieved by nothing. Associated symptoms include rhinorrhea, sore throat, cough and shortness of breath. Pertinent negatives include no chest pain, no chest pressure, no fever and no wheezing. She has had no prior steroid use. Her past medical history does not include asthma, past wheezing or eczema. Urine output has been normal.   she was here several days ago, diagnosed with influenza. Subsequent to that she saw her PCP and was given an antibiotic prescription and a prednisone shot for "sinusitis". She does not have a history of asthma or recurrent bronchitis.  Past Medical History  Diagnosis Date  . Hypertension   . Herniated disc   . Osteoporosis     Past Surgical History  Procedure Date  . Tubal ligation   . Wrist surgery   . Abdominal hysterectomy     No family history on file.  History  Substance Use Topics  . Smoking status: Never Smoker   . Smokeless tobacco: Not on file  . Alcohol Use: No    OB History    Grav Para Term Preterm Abortions TAB SAB Ect Mult Living                  Review of Systems  Constitutional: Negative for fever.  HENT: Positive for sore throat and rhinorrhea.   Respiratory: Positive for cough and shortness of breath. Negative for wheezing.   Cardiovascular: Negative for chest pain.  All other systems reviewed and are negative.    Allergies  Sulfa antibiotics  Home  Medications   Current Outpatient Rx  Name Route Sig Dispense Refill  . CALCIUM CARB-CHOLECALCIFEROL 1000-800 MG-UNIT PO TABS Oral Take 1 tablet by mouth 2 (two) times daily.      . OMEGA-3 FATTY ACIDS 1000 MG PO CAPS Oral Take 1 g by mouth every morning.      Marland Kitchen POTASSIUM CHLORIDE CRYS CR 20 MEQ PO TBCR Oral Take 20 mEq by mouth every morning.      Marland Kitchen PROMETHAZINE-CODEINE 6.25-10 MG/5ML PO SYRP Oral Take 5 mLs by mouth every 6 (six) hours as needed for cough. 120 mL 0  . TRAMADOL HCL 50 MG PO TABS Oral Take 50-100 mg by mouth every 6 (six) hours as needed. For pain/Maximum dose= 8 tablets per day     . TRIAMTERENE-HCTZ 37.5-25 MG PO TABS Oral Take 1 tablet by mouth every morning.        BP 133/78  Pulse 72  Temp(Src) 97.9 F (36.6 C) (Oral)  Resp 18  Ht 5\' 3"  (1.6 m)  Wt 207 lb (93.895 kg)  BMI 36.67 kg/m2  SpO2 94%  Physical Exam  Nursing note and vitals reviewed. Constitutional: She is oriented to person, place, and time. She appears well-developed and well-nourished.  HENT:  Head: Normocephalic.  Eyes: Conjunctivae are normal. Pupils are equal, round, and reactive to light.  Neck: Normal range of motion. Neck supple.  Cardiovascular: Normal rate and regular rhythm.  Pulmonary/Chest: Effort normal.       Somewhat decreased expiratory air movement with cough on inspiration  Abdominal: Soft. Bowel sounds are normal.  Musculoskeletal: Normal range of motion.  Neurological: She is alert and oriented to person, place, and time. No cranial nerve deficit. She exhibits abnormal muscle tone. Coordination normal.  Skin: Skin is warm and dry.  Psychiatric: She has a normal mood and affect. Her behavior is normal. Thought content normal.    ED Course  Procedures (including critical care time)  Labs Reviewed - No data to display Dg Chest 2 View  09/14/2011  *RADIOLOGY REPORT*  Clinical Data: Shortness of breath, wheezing, congestion.  CHEST - 2 VIEW  Comparison: 03/04/2010  Findings:  Lungs are clear. No pleural effusion or pneumothorax. The cardiomediastinal contours are within normal limits. The visualized bones and soft tissues are without significant appreciable abnormality.  IMPRESSION: No acute cardiopulmonary process.  Original Report Authenticated By: Waneta Martins, M.D.     1. Bronchitis       MDM  Patient with apparent bronchitis, now, secondary to her sinusitis. She has a normal O2 saturation. She will be discharged with an albuterol inhaler.      Flint Melter, MD 09/14/11 1610  Flint Melter, MD 09/14/11 [phone]

## 2011-09-14 NOTE — ED Notes (Signed)
Pt reports being seen here on Thursday and was told she may have the flu.  Pt reports worsening since she's went home.  Pt reports cough, and some sob.  Pt reports "feeling like she couldn't breath" tonight while trying to sleep

## 2011-09-14 NOTE — ED Notes (Signed)
Pt reports being seen here on Thursday and being told that she may have the flu. Pt states that her cough has became worse since going home. Pt reports non productive cough. Breath sounds clear throughout. Pt reports pain in her stomach 8/10 when coughing. Pt reports having difficulty breathing while laying down. Pt denies any nausea or vomiting or other flu symptoms at this time.

## 2012-03-19 ENCOUNTER — Emergency Department (HOSPITAL_COMMUNITY)
Admission: EM | Admit: 2012-03-19 | Discharge: 2012-03-19 | Disposition: A | Discharge status: Home / Self Care | Payer: 59 | Attending: Emergency Medicine | Admitting: Emergency Medicine

## 2012-03-19 ENCOUNTER — Encounter (HOSPITAL_COMMUNITY): Payer: Self-pay

## 2012-03-19 DIAGNOSIS — Z882 Allergy status to sulfonamides status: Secondary | ICD-10-CM | POA: Insufficient documentation

## 2012-03-19 DIAGNOSIS — M810 Age-related osteoporosis without current pathological fracture: Secondary | ICD-10-CM | POA: Insufficient documentation

## 2012-03-19 DIAGNOSIS — R5381 Other malaise: Secondary | ICD-10-CM | POA: Insufficient documentation

## 2012-03-19 DIAGNOSIS — R51 Headache: Secondary | ICD-10-CM

## 2012-03-19 DIAGNOSIS — I10 Essential (primary) hypertension: Secondary | ICD-10-CM | POA: Insufficient documentation

## 2012-03-19 LAB — POCT I-STAT, CHEM 8
Calcium, Ion: 1.18 mmol/L (ref 1.12–1.32)
Chloride: 105 mEq/L (ref 96–112)
HCT: 41 % (ref 36.0–46.0)
Potassium: 3.5 mEq/L (ref 3.5–5.1)

## 2012-03-19 MED ORDER — SODIUM CHLORIDE 0.9 % IV SOLN
INTRAVENOUS | Status: DC
  Administered 2012-03-19: 15:00:00 via INTRAVENOUS

## 2012-03-19 MED ORDER — DIPHENHYDRAMINE HCL 50 MG/ML IJ SOLN
25.0000 mg | Freq: Once | INTRAMUSCULAR | Status: AC
  Administered 2012-03-19: 50 mg via INTRAVENOUS
  Filled 2012-03-19: qty 1

## 2012-03-19 MED ORDER — SODIUM CHLORIDE 0.9 % IV BOLUS (SEPSIS)
1000.0000 mL | Freq: Once | INTRAVENOUS | Status: AC
  Administered 2012-03-19: 1000 mL via INTRAVENOUS

## 2012-03-19 MED ORDER — METOCLOPRAMIDE HCL 5 MG/ML IJ SOLN
10.0000 mg | Freq: Once | INTRAMUSCULAR | Status: AC
  Administered 2012-03-19: 10 mg via INTRAVENOUS
  Filled 2012-03-19: qty 2

## 2012-03-19 MED ORDER — PROMETHAZINE HCL 25 MG RE SUPP: 25.0000 mg | Freq: Four times a day (QID) | RECTAL | Status: DC | PRN

## 2012-03-19 NOTE — Discharge Instructions (Signed)
Go home and rest. Drink plenty of fluids. Use the phenergan suppositories for nausea or headaches. Recheck if you feel worse.

## 2012-03-19 NOTE — ED Provider Notes (Cosign Needed)
History    This chart was scribed for Ward Givens, MD, MD by Smitty Pluck. The patient was seen in room APA03 and the patient's care was started at 2:26PM.   CSN: [id number]  Arrival date & time 03/19/12  1126   First MD Initiated Contact with Patient 03/19/12 1210      Chief Complaint  Patient presents with  . Headache    (Consider location/radiation/quality/duration/timing/severity/associated sxs/prior treatment) The history is provided by the patient.   Jessica Kirby is a 56 y.o. female who presents to the Emergency Department complaining of moderate headache onset this AM. Pt reports waking up with headache. Pt reports taking Wellbutrin for depression at 6AM. She took her potassium and BP medication. She states the pain is located at base of head posteriorly. She reports feeling weak at work. Pt reports taking ibuprofen with minor relief. She has hx of headaches and has had them in this location before. She reports that she had photophobia and sensitivity to sound. Denies nausea and vomiting. Pt reports that she has had trouble seeing certain things. Denies numbness and tingling in arm and legs.Pt states she feels weak and felt like she might pass out at work.  Pt denies drinking alcohol and smoking tobacco. Pt reports that she has been working extra hours lately and long work weeks. Pt states she is working 14 days with 1 day off for a long time.   PCP is Dr. Lubertha South   Past Medical History  Diagnosis Date  . Hypertension   . Herniated disc   . Osteoporosis   depression anxiety  Past Surgical History  Procedure Date  . Tubal ligation   . Wrist surgery   . Abdominal hysterectomy     No family history on file.  History  Substance Use Topics  . Smoking status: Never Smoker   . Smokeless tobacco: Not on file  . Alcohol Use: No  employed  OB History    Grav Para Term Preterm Abortions TAB SAB Ect Mult Living                  Review of Systems  All other  systems reviewed and are negative.   10 Systems reviewed and all are negative for acute change except as noted in the HPI.   Allergies  Sulfa antibiotics  Home Medications   Current Outpatient Rx  Name Route Sig Dispense Refill  . ALPRAZOLAM 0.5 MG PO TABS Oral Take 0.5 mg by mouth at bedtime as needed. FOR SLEEP AND ANXIETY    . BUPROPION HCL ER (XL) 300 MG PO TB24 Oral Take 300 mg by mouth daily.    Marland Kitchen LORATADINE 10 MG PO TABS Oral Take 10 mg by mouth daily as needed. FOR ALLERGIES    . POTASSIUM CHLORIDE CRYS ER 20 MEQ PO TBCR Oral Take 20 mEq by mouth every morning.      . TRIAMTERENE-HCTZ 37.5-25 MG PO TABS Oral Take 1 tablet by mouth every morning.        BP 148/72  Pulse 76  Temp 98.1 F (36.7 C) (Oral)  Resp 18  Ht 5\' 2"  (1.575 m)  Wt 207 lb (93.895 kg)  BMI 37.86 kg/m2  SpO2 100%  Vital signs normal    Physical Exam  Nursing note and vitals reviewed. Constitutional: She is oriented to person, place, and time. She appears well-developed and well-nourished. No distress.  HENT:  Head: Normocephalic and atraumatic.  Right Ear: External ear normal.  Left Ear: External ear normal.  Nose: Nose normal.  Mouth/Throat: Oropharynx is clear and moist.  Eyes: Conjunctivae and EOM are normal. Pupils are equal, round, and reactive to light.  Neck: Normal range of motion. Neck supple.  Cardiovascular: Normal rate, regular rhythm and normal heart sounds.   Pulmonary/Chest: Effort normal and breath sounds normal. No respiratory distress.  Neurological: She is alert and oriented to person, place, and time.  Skin: Skin is warm and dry.       Pt c/o two knots below her left ear, has two small red areas about the size of 2 mm each inf to her left ear c/w insect bite.  Pt denies any tick exposure  Psychiatric: Her behavior is normal.       smiles    ED Course  Procedures (including critical care time)   Medications  0.9 %  sodium chloride infusion (  Intravenous New  Bag/Given 03/19/12 1455)  sodium chloride 0.9 % bolus 1,000 mL (1000 mL Intravenous Given 03/19/12 1454)  metoCLOPramide (REGLAN) injection 10 mg (10 mg Intravenous Given 03/19/12 1455)  diphenhydrAMINE (BENADRYL) injection 25 mg (50 mg Intravenous Given 03/19/12 1454)    DIAGNOSTIC STUDIES: Oxygen Saturation is 97% on room air, normal by my interpretation.    COORDINATION OF CARE: 2:25PM EDP discusses pt ED treatment with pt.   2:45PM EDP ordered medication: 0.9% NaCl infusion, reglan 10 mg, benadryl 25 mg   16:20 states her headache is gone, feels ready to go home. Requests work note.   1. Headache     New Prescriptions   PROMETHAZINE (PHENERGAN) 25 MG SUPPOSITORY    Place 1 suppository (25 mg total) rectally every 6 (six) hours as needed for nausea (or headache).    Plan discharge  Devoria Albe, MD, FACEP   MDM   I personally performed the services described in this documentation, which was scribed in my presence. The recorded information has been reviewed and considered.  Devoria Albe, MD, Armando Gang        Ward Givens, MD 03/19/12 [phone]

## 2012-03-19 NOTE — ED Notes (Signed)
Pt states h/a began when she woke up this morning. Denies nausea and vomiting. Pt states she had muscle weakness and light headedness with nausea earlier but not at present time. NAD.

## 2012-03-19 NOTE — ED Notes (Signed)
Pt states she woke up with a headache  

## 2012-12-21 ENCOUNTER — Encounter: Payer: Self-pay | Admitting: *Deleted

## 2012-12-23 ENCOUNTER — Encounter: Payer: Self-pay | Admitting: Nurse Practitioner

## 2012-12-23 ENCOUNTER — Ambulatory Visit (INDEPENDENT_AMBULATORY_CARE_PROVIDER_SITE_OTHER): Payer: Self-pay | Admitting: Nurse Practitioner

## 2012-12-23 VITALS — BP 146/106 | HR 80 | Wt 208.4 lb

## 2012-12-23 DIAGNOSIS — J300 Vasomotor rhinitis: Secondary | ICD-10-CM

## 2012-12-23 DIAGNOSIS — J309 Allergic rhinitis, unspecified: Secondary | ICD-10-CM

## 2012-12-23 DIAGNOSIS — F341 Dysthymic disorder: Secondary | ICD-10-CM

## 2012-12-23 DIAGNOSIS — H9190 Unspecified hearing loss, unspecified ear: Secondary | ICD-10-CM

## 2012-12-23 DIAGNOSIS — H9193 Unspecified hearing loss, bilateral: Secondary | ICD-10-CM

## 2012-12-23 DIAGNOSIS — F418 Other specified anxiety disorders: Secondary | ICD-10-CM

## 2012-12-23 MED ORDER — METHYLPREDNISOLONE ACETATE 40 MG/ML IJ SUSP
40.0000 mg | Freq: Once | INTRAMUSCULAR | Status: AC
  Administered 2012-12-23: 40 mg via INTRAMUSCULAR

## 2012-12-23 NOTE — Patient Instructions (Addendum)
Recommend massage therapy, ice and/or heat applications.  Also consider chiropracter if insurance will cover. Stop Wellbutrin.  Wait at least 1-2 weeks. Then cut Celexa in half (10 mg) and take for 2 weeks then stop.

## 2012-12-24 ENCOUNTER — Encounter: Payer: Self-pay | Admitting: Nurse Practitioner

## 2012-12-24 DIAGNOSIS — I10 Essential (primary) hypertension: Secondary | ICD-10-CM | POA: Insufficient documentation

## 2012-12-24 DIAGNOSIS — E039 Hypothyroidism, unspecified: Secondary | ICD-10-CM | POA: Insufficient documentation

## 2012-12-24 DIAGNOSIS — F418 Other specified anxiety disorders: Secondary | ICD-10-CM | POA: Insufficient documentation

## 2012-12-24 DIAGNOSIS — M8580 Other specified disorders of bone density and structure, unspecified site: Secondary | ICD-10-CM | POA: Insufficient documentation

## 2012-12-24 DIAGNOSIS — H9190 Unspecified hearing loss, unspecified ear: Secondary | ICD-10-CM | POA: Insufficient documentation

## 2012-12-24 DIAGNOSIS — J300 Vasomotor rhinitis: Secondary | ICD-10-CM | POA: Insufficient documentation

## 2012-12-24 NOTE — Assessment & Plan Note (Signed)
Symptoms are stable. Given refill of Wellbutrin and Celexa. Recommend followup in a few months.

## 2012-12-24 NOTE — Assessment & Plan Note (Signed)
Continue loratadine as directed. Avoid use of decongestants due to elevated blood pressure. Because of report of difficulty hearing in recurrent nature of symptoms, refer to ENT specialist for further evaluation.

## 2012-12-24 NOTE — Progress Notes (Signed)
Subjective:  Presents complaints of trouble hearing that is been going on for several months. Has been told by friends and family that she speaking louder than usual. Some dryness and itching in ears. Pressure at times. No history of ventilation tubes. No history of ear injury. Works at a job loud noise but wears ear plugs. Occasional occipital area headache. No sinus headache at this time. Postnasal drainage. No sore throat. Occasional nonproductive cough. Mild swelling and the lymph nodes at times. No fever. No wheezing. History of recurrent rhinitis and sinusitis, last treated 09/27/12. Has seen Dr. Andrey Campanile, ENT specialist in [place] in the past. Was using a steroid nasal spray at that time which caused a sore in her nostril which had to be treated with antibiotics.  Objective:   BP 146/106  Pulse 80  Wt 208 lb 6.4 oz (94.53 kg)  BMI 38.11 kg/m2 NAD. Alert, oriented. TMs very retracted, no erythema. Pharynx nonerythematous with cloudy PND noted. Neck supple with mild soft nontender adenopathy. Lungs clear. Heart regular rate rhythm.

## 2013-01-11 ENCOUNTER — Other Ambulatory Visit: Payer: Self-pay | Admitting: Family Medicine

## 2013-01-11 DIAGNOSIS — Z139 Encounter for screening, unspecified: Secondary | ICD-10-CM

## 2013-01-12 ENCOUNTER — Ambulatory Visit (INDEPENDENT_AMBULATORY_CARE_PROVIDER_SITE_OTHER): Payer: 59 | Admitting: Otolaryngology

## 2013-01-16 ENCOUNTER — Ambulatory Visit (HOSPITAL_COMMUNITY)
Admission: RE | Admit: 2013-01-16 | Discharge: 2013-01-16 | Disposition: A | Discharge status: Home / Self Care | Payer: 59 | Source: Ambulatory Visit | Attending: Family Medicine | Admitting: Family Medicine

## 2013-01-16 DIAGNOSIS — Z139 Encounter for screening, unspecified: Secondary | ICD-10-CM

## 2013-01-16 DIAGNOSIS — Z1231 Encounter for screening mammogram for malignant neoplasm of breast: Secondary | ICD-10-CM | POA: Insufficient documentation

## 2013-01-27 ENCOUNTER — Other Ambulatory Visit: Payer: Self-pay | Admitting: Family Medicine

## 2013-01-27 ENCOUNTER — Other Ambulatory Visit: Payer: Self-pay | Admitting: Nurse Practitioner

## 2013-01-27 MED ORDER — PERMETHRIN 5 % EX CREA: TOPICAL_CREAM | CUTANEOUS | Status: DC

## 2013-01-27 NOTE — Telephone Encounter (Signed)
Was seen in office 06/30/13, needs refill

## 2013-02-23 ENCOUNTER — Ambulatory Visit (INDEPENDENT_AMBULATORY_CARE_PROVIDER_SITE_OTHER): Payer: 59 | Admitting: Otolaryngology

## 2013-02-23 DIAGNOSIS — H903 Sensorineural hearing loss, bilateral: Secondary | ICD-10-CM

## 2013-02-23 DIAGNOSIS — H6980 Other specified disorders of Eustachian tube, unspecified ear: Secondary | ICD-10-CM

## 2013-04-11 ENCOUNTER — Ambulatory Visit (INDEPENDENT_AMBULATORY_CARE_PROVIDER_SITE_OTHER): Payer: 59 | Admitting: Nurse Practitioner

## 2013-04-11 ENCOUNTER — Encounter: Payer: Self-pay | Admitting: Nurse Practitioner

## 2013-04-11 VITALS — BP 158/90 | HR 80 | Wt 213.4 lb

## 2013-04-11 DIAGNOSIS — K219 Gastro-esophageal reflux disease without esophagitis: Secondary | ICD-10-CM

## 2013-04-11 DIAGNOSIS — R7309 Other abnormal glucose: Secondary | ICD-10-CM

## 2013-04-11 DIAGNOSIS — E8940 Asymptomatic postprocedural ovarian failure: Secondary | ICD-10-CM

## 2013-04-11 DIAGNOSIS — E785 Hyperlipidemia, unspecified: Secondary | ICD-10-CM

## 2013-04-11 DIAGNOSIS — R739 Hyperglycemia, unspecified: Secondary | ICD-10-CM

## 2013-04-11 DIAGNOSIS — E039 Hypothyroidism, unspecified: Secondary | ICD-10-CM

## 2013-04-11 DIAGNOSIS — R5383 Other fatigue: Secondary | ICD-10-CM

## 2013-04-11 DIAGNOSIS — R5381 Other malaise: Secondary | ICD-10-CM

## 2013-04-11 DIAGNOSIS — E8941 Symptomatic postprocedural ovarian failure: Secondary | ICD-10-CM

## 2013-04-11 LAB — CBC WITH DIFFERENTIAL/PLATELET
Lymphocytes Relative: 31 % (ref 12–46)
Lymphs Abs: 2.3 10*3/uL (ref 0.7–4.0)
Neutro Abs: 4.3 10*3/uL (ref 1.7–7.7)
Neutrophils Relative %: 59 % (ref 43–77)
Platelets: 272 10*3/uL (ref 150–400)
RBC: 4.63 MIL/uL (ref 3.87–5.11)
WBC: 7.5 10*3/uL (ref 4.0–10.5)

## 2013-04-11 LAB — HEMOGLOBIN A1C: Hgb A1c MFr Bld: 5.4 % (ref <5.7)

## 2013-04-11 LAB — BASIC METABOLIC PANEL
BUN: 16 mg/dL (ref 6–23)
CO2: 27 mEq/L (ref 19–32)
Calcium: 9.1 mg/dL (ref 8.4–10.5)
Glucose, Bld: 89 mg/dL (ref 70–99)
Potassium: 3.8 mEq/L (ref 3.5–5.3)

## 2013-04-11 LAB — HEPATIC FUNCTION PANEL
ALT: 16 U/L (ref 0–35)
AST: 15 U/L (ref 0–37)
Bilirubin, Direct: 0.1 mg/dL (ref 0.0–0.3)
Indirect Bilirubin: 0.5 mg/dL (ref 0.0–0.9)

## 2013-04-11 LAB — TSH: TSH: 2.02 u[IU]/mL (ref 0.350–4.500)

## 2013-04-11 LAB — LIPID PANEL: Cholesterol: 189 mg/dL (ref 0–200)

## 2013-04-11 MED ORDER — RANITIDINE HCL 300 MG PO TABS: 300.0000 mg | ORAL_TABLET | Freq: Every day | ORAL | Status: DC

## 2013-04-11 NOTE — Assessment & Plan Note (Signed)
TSH pending. 

## 2013-04-11 NOTE — Patient Instructions (Addendum)
Sleeve gastrectomy Progesterone cream ([employer])   Gastroesophageal Reflux Disease, Adult Gastroesophageal reflux disease (GERD) happens when acid from your stomach flows up into the esophagus. When acid comes in contact with the esophagus, the acid causes soreness (inflammation) in the esophagus. Over time, GERD may create small holes (ulcers) in the lining of the esophagus. CAUSES   Increased body weight. This puts pressure on the stomach, making acid rise from the stomach into the esophagus.  Smoking. This increases acid production in the stomach.  Drinking alcohol. This causes decreased pressure in the lower esophageal sphincter (valve or ring of muscle between the esophagus and stomach), allowing acid from the stomach into the esophagus.  Late evening meals and a full stomach. This increases pressure and acid production in the stomach.  A malformed lower esophageal sphincter. Sometimes, no cause is found. SYMPTOMS   Burning pain in the lower part of the mid-chest behind the breastbone and in the mid-stomach area. This may occur twice a week or more often.  Trouble swallowing.  Sore throat.  Dry cough.  Asthma-like symptoms including chest tightness, shortness of breath, or wheezing. DIAGNOSIS  Your caregiver may be able to diagnose GERD based on your symptoms. In some cases, X-rays and other tests may be done to check for complications or to check the condition of your stomach and esophagus. TREATMENT  Your caregiver may recommend over-the-counter or prescription medicines to help decrease acid production. Ask your caregiver before starting or adding any new medicines.  HOME CARE INSTRUCTIONS   Change the factors that you can control. Ask your caregiver for guidance concerning weight loss, quitting smoking, and alcohol consumption.  Avoid foods and drinks that make your symptoms worse, such as:  Caffeine or alcoholic  drinks.  Chocolate.  Peppermint or mint flavorings.  Garlic and onions.  Spicy foods.  Citrus fruits, such as oranges, lemons, or limes.  Tomato-based foods such as sauce, chili, salsa, and pizza.  Fried and fatty foods.  Avoid lying down for the 3 hours prior to your bedtime or prior to taking a nap.  Eat small, frequent meals instead of large meals.  Wear loose-fitting clothing. Do not wear anything tight around your waist that causes pressure on your stomach.  Raise the head of your bed 6 to 8 inches with wood blocks to help you sleep. Extra pillows will not help.  Only take over-the-counter or prescription medicines for pain, discomfort, or fever as directed by your caregiver.  Do not take aspirin, ibuprofen, or other nonsteroidal anti-inflammatory drugs (NSAIDs). SEEK IMMEDIATE MEDICAL CARE IF:   You have pain in your arms, neck, jaw, teeth, or back.  Your pain increases or changes in intensity or duration.  You develop nausea, vomiting, or sweating (diaphoresis).  You develop shortness of breath, or you faint.  Your vomit is green, yellow, black, or looks like coffee grounds or blood.  Your stool is red, bloody, or black. These symptoms could be signs of other problems, such as heart disease, gastric bleeding, or esophageal bleeding. MAKE SURE YOU:   Understand these instructions.  Will watch your condition.  Will get help right away if you are not doing well or get worse. Document Released: 06/24/2005 Document Revised: 12/07/2011 Document Reviewed: 04/03/2011 [facility] Patient Information 2014 [place], [place].

## 2013-04-11 NOTE — Progress Notes (Signed)
Subjective:  Presents for Complaints of fatigue and weight gain. Limited activity. No major changes in her diet. No chest pain shortness of breath or edema. No palpitations. Compliant with medications. Also some slight burning and acid reflux lately more towards the upper end of the esophagus. Nausea but no vomiting. Has been drinking more caffeine lately. No change in her stress level.  Objective:   BP 158/90  Pulse 80  Wt 213 lb 6.4 oz (96.798 kg)  BMI 39.02 kg/m2 NAD. Alert, oriented. Lungs clear. Heart regular rate rhythm. Thyroid normal limit to palpation and nontender. Central obesity noted with large waist circumference. Abdomen soft nondistended nontender. FBS on lab work dated 07/05/12 was 102.   Assessment:Hyperglycemia - Plan: Hemoglobin A1c, Basic metabolic panel, Hemoglobin A1c, Basic metabolic panel  Other malaise and fatigue - Plan: CBC with Differential, Hepatic function panel, Basic metabolic panel, Vitamin D 25 hydroxy, CBC with Differential, Hepatic function panel, Basic metabolic panel, Vitamin D 25 hydroxy  Other and unspecified hyperlipidemia - Plan: Lipid panel, Lipid panel  GERD (gastroesophageal reflux disease)  Hypothyroidism - Plan: TSH, TSH  Postsurgical menopause    Plan: Further followup based on lab work. If sugar remains above 100, plan to add metformin to her regimen. Discussed importance of weight loss, encouraged patient to look into bariatric surgery. Start Zantac 300 mg 1 by mouth daily when necessary. Reviewed lifestyle measures affecting her reflux symptoms. Call back if worsens or persists.

## 2013-04-12 ENCOUNTER — Telehealth: Payer: Self-pay | Admitting: Family Medicine

## 2013-04-12 LAB — VITAMIN D 25 HYDROXY (VIT D DEFICIENCY, FRACTURES): Vit D, 25-Hydroxy: 24 ng/mL — ABNORMAL LOW (ref 30–89)

## 2013-04-12 NOTE — Telephone Encounter (Signed)
Pt wants you to call her on her mobile once her lab results are done thank you

## 2013-04-13 ENCOUNTER — Encounter: Payer: Self-pay | Admitting: *Deleted

## 2013-04-13 ENCOUNTER — Other Ambulatory Visit: Payer: Self-pay | Admitting: *Deleted

## 2013-04-13 MED ORDER — RANITIDINE HCL 300 MG PO TABS: 300.0000 mg | ORAL_TABLET | Freq: Every day | ORAL | Status: DC

## 2013-04-14 ENCOUNTER — Other Ambulatory Visit: Payer: Self-pay | Admitting: Nurse Practitioner

## 2013-04-14 ENCOUNTER — Telehealth: Payer: Self-pay | Admitting: Family Medicine

## 2013-04-14 ENCOUNTER — Telehealth: Payer: Self-pay

## 2013-04-14 MED ORDER — PROGESTERONE 4 % VA GEL: VAGINAL | Status: DC

## 2013-04-14 NOTE — Telephone Encounter (Signed)
[facility]

## 2013-04-14 NOTE — Telephone Encounter (Signed)
Ask patient to check with her insurance to see if they will cover Progesterone vaginal gel.  There are several brands of this.  If not, call back and I will order oral form

## 2013-04-14 NOTE — Telephone Encounter (Signed)
Patient states that she talked to pharmacist at [facility] and she said they told her we can go ahead and send in a rx for the progesterone vaginal gel and they believe they can get it covered through her insurance.

## 2013-04-14 NOTE — Telephone Encounter (Signed)
Patient was notified to check with her insurance to see if they will cover Progesterone vaginal gel. There are several brands of this. If not, call back and I will order oral form. Patient verbalized understanding.

## 2013-04-14 NOTE — Telephone Encounter (Signed)
Patient says insurance wont cover the cream progesterone, so she would like to try the pill form.

## 2013-04-17 ENCOUNTER — Telehealth: Payer: Self-pay | Admitting: Family Medicine

## 2013-04-17 NOTE — Telephone Encounter (Signed)
Left message on voicemail notifying patient rx was sent on 04/14/13 to [facility] according to system. Advised patient to contact [facility] to see if it was filled by them.

## 2013-04-17 NOTE — Telephone Encounter (Signed)
Patient says that her progesterone gel was supposed to be called in to [facility], but it was sent in to [facility]. She would like this corrected.

## 2013-04-27 ENCOUNTER — Ambulatory Visit (INDEPENDENT_AMBULATORY_CARE_PROVIDER_SITE_OTHER): Payer: 59 | Admitting: Nurse Practitioner

## 2013-04-27 ENCOUNTER — Encounter: Payer: Self-pay | Admitting: Nurse Practitioner

## 2013-04-27 VITALS — BP 126/96 | HR 80 | Wt 214.0 lb

## 2013-04-27 DIAGNOSIS — R7309 Other abnormal glucose: Secondary | ICD-10-CM

## 2013-04-27 DIAGNOSIS — R739 Hyperglycemia, unspecified: Secondary | ICD-10-CM

## 2013-04-27 DIAGNOSIS — R3915 Urgency of urination: Secondary | ICD-10-CM

## 2013-04-29 ENCOUNTER — Encounter: Payer: Self-pay | Admitting: Nurse Practitioner

## 2013-04-29 LAB — POCT UA - MICROSCOPIC ONLY
Bacteria, U Microscopic: 0
Crystals, Ur, HPF, POC: 0
Epithelial cells, urine per micros: 0
Mucus, UA: 0
RBC, urine, microscopic: 0

## 2013-04-29 NOTE — Progress Notes (Signed)
Subjective:  Presents for complaints of urinary frequency that is been going on for some time, worse over the past month. Stop her Maxzide this week thinking that this may have contributed to her symptoms. Getting up 2-3 times per night to urinate. No incontinence. No dysuria. Some urgency. No fever. No pelvic pain. No discharge. Same sexual partner. PMH includes hysterectomy and bilateral oophorectomy.  Objective:   BP 126/96  Pulse 80  Wt 214 lb (97.07 kg)  BMI 39.13 kg/m2 NAD. Alert, oriented. Lungs clear. Heart regular rate rhythm. No CVA area tenderness. Abdomen soft nondistended nontender. Random glucose normal. Urine microscopic negative. Hemoglobin A1c on 7/15 was 5.4.  Assessment:Urinary urgency - Plan: POCT urinalysis dipstick, POCT UA - Microscopic Only  Hyperglycemia - Plan: POCT glucose (manual entry)  Plan: Recommend stopping progesterone and Maxzide for one week. If no improvement in symptoms, resume meds. Recommended patient call the office to let us know if this intervention helps. Discussed use of medications for her urinary urgency. Strongly recommend preventive health physical at her next visit.

## 2013-05-01 LAB — POCT URINALYSIS DIPSTICK: Spec Grav, UA: 1.015

## 2013-05-16 ENCOUNTER — Telehealth: Payer: Self-pay | Admitting: Nurse Practitioner

## 2013-05-16 NOTE — Telephone Encounter (Signed)
Patient states her joints in her hand are swelling.  Informed me she needs an appointment after 4pm due to getting points at work.  States Jessica Kirby informed her she would be able to get an appointment after 4pm.  I need prior approval please

## 2013-05-16 NOTE — Telephone Encounter (Signed)
Ok with carolyn

## 2013-05-17 NOTE — Telephone Encounter (Signed)
Patient notified

## 2013-05-18 ENCOUNTER — Encounter: Payer: Self-pay | Admitting: Nurse Practitioner

## 2013-05-18 ENCOUNTER — Ambulatory Visit (INDEPENDENT_AMBULATORY_CARE_PROVIDER_SITE_OTHER): Payer: 59 | Admitting: Nurse Practitioner

## 2013-05-18 VITALS — BP 142/94 | Ht 62.0 in | Wt 213.6 lb

## 2013-05-18 DIAGNOSIS — M1990 Unspecified osteoarthritis, unspecified site: Secondary | ICD-10-CM

## 2013-05-18 DIAGNOSIS — M2550 Pain in unspecified joint: Secondary | ICD-10-CM

## 2013-05-18 DIAGNOSIS — R5381 Other malaise: Secondary | ICD-10-CM

## 2013-05-19 LAB — ANA: Anti Nuclear Antibody(ANA): NEGATIVE

## 2013-05-22 ENCOUNTER — Encounter: Payer: Self-pay | Admitting: Nurse Practitioner

## 2013-05-22 NOTE — Progress Notes (Signed)
Subjective:  Presents for complaints of swelling particularly at the distal joints of her fingers occurring off and on. Lately has noticed more erythema and tenderness. Fingers are stiff in the morning. Has some arthritis changes in other joints, but no erythema or warmth. No fever. No rash. Continued fatigue.  Objective:   BP 142/94  Ht 5\' 2"  (1.575 m)  Wt 213 lb 9.6 oz (96.888 kg)  BMI 39.06 kg/m2 NAD. Alert, oriented. Lungs clear. Heart regular rate rhythm. The distal joints on all of her fingers half nodularity with mild erythema warmth and tenderness. Mild early "swan neck deformity" noted on her fingers.  Assessment:Arthralgia - Plan: Antinuclear Antib (ANA), Rheumatoid Factor, Sed Rate (ESR)  Other malaise and fatigue - Plan: Antinuclear Antib (ANA), Rheumatoid Factor, Sed Rate (ESR)  Osteoarthritis  Plan: Further followup based on test results, patient to call back if new symptoms occur.

## 2013-06-26 ENCOUNTER — Telehealth: Payer: Self-pay | Admitting: Family Medicine

## 2013-06-26 ENCOUNTER — Ambulatory Visit (INDEPENDENT_AMBULATORY_CARE_PROVIDER_SITE_OTHER): Payer: 59 | Admitting: Nurse Practitioner

## 2013-06-26 ENCOUNTER — Encounter: Payer: Self-pay | Admitting: Nurse Practitioner

## 2013-06-26 VITALS — BP 168/98 | Ht 62.0 in | Wt 219.8 lb

## 2013-06-26 DIAGNOSIS — M8580 Other specified disorders of bone density and structure, unspecified site: Secondary | ICD-10-CM

## 2013-06-26 DIAGNOSIS — Z0000 Encounter for general adult medical examination without abnormal findings: Secondary | ICD-10-CM

## 2013-06-26 DIAGNOSIS — M899 Disorder of bone, unspecified: Secondary | ICD-10-CM

## 2013-06-26 DIAGNOSIS — Z01419 Encounter for gynecological examination (general) (routine) without abnormal findings: Secondary | ICD-10-CM

## 2013-06-26 MED ORDER — INDAPAMIDE 2.5 MG PO TABS: 2.5000 mg | ORAL_TABLET | ORAL | Status: DC

## 2013-06-26 NOTE — Telephone Encounter (Signed)
Jessica Kirby said that she did tell her she would see her at 4:20 because work is giving her a hard time about getting off to come in every 3 months.

## 2013-06-26 NOTE — Patient Instructions (Addendum)
Check BP outside office; goal is 140/90 or less Daily mulitvitamin for women over 50

## 2013-06-26 NOTE — Telephone Encounter (Signed)
When patient checked out today, I offered to schedule her 3 month appointment with Jessica Kirby and she told me that it would have to be after 4. I told her that we don't schedule appointments after 4 and she explained to me that Jessica Kirby told her that she can have follow ups from now on after 4 pm. When she calls back to make her appointment, are we supposed to schedule her for a 4:30? Please advise. I was not aware of [place] doing this for anyone.

## 2013-06-29 ENCOUNTER — Encounter: Payer: Self-pay | Admitting: Nurse Practitioner

## 2013-06-29 NOTE — Progress Notes (Signed)
  Subjective:    Patient ID: Jessica Kirby, female    DOB: Feb 06, 1956, 57 y.o.   MRN: [id number]  HPI presents for wellness exam. Has not had her blood pressure pills this morning. Has stopped her Dyazide, when she tries to take it causes a bad taste in her mouth for days. Overall healthy diet. Minimal activity. Gets regular eye and dental exams. Refuses flu vaccine. No new sexual partners.    Review of Systems  Constitutional: Negative for activity change, appetite change and fatigue.  HENT: Negative for hearing loss, ear pain, congestion, sore throat, rhinorrhea and dental problem.   Eyes: Negative for visual disturbance.  Respiratory: Negative for cough, chest tightness, shortness of breath and wheezing.   Cardiovascular: Negative for chest pain.  Gastrointestinal: Negative for nausea, vomiting, abdominal pain, diarrhea, constipation and blood in stool.  Genitourinary: Positive for frequency. Negative for dysuria, urgency, vaginal discharge, difficulty urinating and pelvic pain.  Psychiatric/Behavioral: Negative for sleep disturbance and dysphoric mood. The patient is not nervous/anxious.        Objective:   Physical Exam  Constitutional: She is oriented to person, place, and time. She appears well-developed. No distress.  HENT:  Right Ear: External ear normal.  Left Ear: External ear normal.  Mouth/Throat: Oropharynx is clear and moist.  Neck: Normal range of motion. Neck supple. No tracheal deviation present. No thyromegaly present.  Cardiovascular: Normal rate, regular rhythm and normal heart sounds.  Exam reveals no gallop.   No murmur heard. Pulmonary/Chest: Effort normal and breath sounds normal.  Abdominal: Soft. She exhibits no distension. There is no tenderness.  Genitourinary: Vagina normal and uterus normal. No vaginal discharge found.  Musculoskeletal: She exhibits no edema.  Lymphadenopathy:    She has no cervical adenopathy.  Neurological: She is alert and  oriented to person, place, and time.  Skin: Skin is warm and dry. No rash noted.  Psychiatric: She has a normal mood and affect. Her behavior is normal.   breast exam: No masses noted, excellent no adenopathy. External GU normal. Vagina no discharge. Pale and moist. No cystocele or rectocele noted. Rectal exam normal, no stool for Hemoccult.        Assessment & Plan:  Well woman exam  Routine general medical examination at a health care facility - Plan: POC Hemoccult Bld/Stl (3-Cd Home Screen)  osteopenia Stop Dyazide. Switched Lozol 2.5 mg daily. Call back if any further problems. Recommend patient check BP outside office and call back if remains elevated. Recommend daily multivitamin with extra calcium and vitamin D. Recommend repeat bone density, last one was done in 2010. Recheck in 3-4 months, next physical in one year.

## 2013-06-29 NOTE — Assessment & Plan Note (Signed)
Repeat bone density test ordered, last one was 2010.

## 2013-07-25 ENCOUNTER — Other Ambulatory Visit: Payer: Self-pay | Admitting: Nurse Practitioner

## 2013-08-18 ENCOUNTER — Other Ambulatory Visit: Payer: Self-pay | Admitting: Nurse Practitioner

## 2013-08-18 NOTE — Telephone Encounter (Signed)
Ok plus 5 monthly ref 

## 2013-08-26 ENCOUNTER — Other Ambulatory Visit: Payer: Self-pay | Admitting: Nurse Practitioner

## 2013-09-13 ENCOUNTER — Encounter: Payer: Self-pay | Admitting: Family Medicine

## 2013-09-13 ENCOUNTER — Ambulatory Visit (INDEPENDENT_AMBULATORY_CARE_PROVIDER_SITE_OTHER): Payer: 59 | Admitting: Family Medicine

## 2013-09-13 VITALS — BP 138/96 | Temp 98.3°F | Ht 62.0 in | Wt 196.4 lb

## 2013-09-13 DIAGNOSIS — I10 Essential (primary) hypertension: Secondary | ICD-10-CM

## 2013-09-13 DIAGNOSIS — J019 Acute sinusitis, unspecified: Secondary | ICD-10-CM

## 2013-09-13 MED ORDER — AMOXICILLIN-POT CLAVULANATE 875-125 MG PO TABS: 1.0000 | ORAL_TABLET | Freq: Two times a day (BID) | ORAL | Status: AC

## 2013-09-13 NOTE — Progress Notes (Signed)
   Subjective:    Patient ID: Jessica Kirby, female    DOB: 30-Apr-1956, 57 y.o.   MRN: [id number]  Sinusitis This is a new problem. The current episode started yesterday. There has been no fever. Associated symptoms include congestion, coughing, headaches and sinus pressure. Past treatments include oral decongestants. The treatment provided mild relief.    pmh benign  Review of Systems  HENT: Positive for congestion and sinus pressure.   Respiratory: Positive for cough.   Neurological: Positive for headaches.       Objective:   Physical Exam  Nursing note and vitals reviewed. Constitutional: She appears well-developed.  HENT:  Head: Normocephalic.  Nose: Nose normal.  Mouth/Throat: Oropharynx is clear and moist. No oropharyngeal exudate.  Neck: Neck supple.  Cardiovascular: Normal rate and normal heart sounds.   No murmur heard. Pulmonary/Chest: Effort normal and breath sounds normal. She has no wheezes.  Lymphadenopathy:    She has no cervical adenopathy.  Skin: Skin is warm and dry.          Assessment & Plan:  Sinusitis atx warnings discussed

## 2013-09-20 ENCOUNTER — Ambulatory Visit (INDEPENDENT_AMBULATORY_CARE_PROVIDER_SITE_OTHER): Payer: 59 | Admitting: Nurse Practitioner

## 2013-09-20 ENCOUNTER — Encounter: Payer: Self-pay | Admitting: Nurse Practitioner

## 2013-09-20 VITALS — BP 134/84 | Ht 62.0 in | Wt 196.0 lb

## 2013-09-20 DIAGNOSIS — K219 Gastro-esophageal reflux disease without esophagitis: Secondary | ICD-10-CM

## 2013-09-20 DIAGNOSIS — K5900 Constipation, unspecified: Secondary | ICD-10-CM

## 2013-09-20 DIAGNOSIS — I10 Essential (primary) hypertension: Secondary | ICD-10-CM

## 2013-09-20 NOTE — Patient Instructions (Signed)
High-Fiber Diet Fiber is found in fruits, vegetables, and grains. A high-fiber diet encourages the addition of more whole grains, legumes, fruits, and vegetables in your diet. The recommended amount of fiber for adult males is 38 g per day. For adult females, it is 25 g per day. Pregnant and lactating women should get 28 g of fiber per day. If you have a digestive or bowel problem, ask your caregiver for advice before adding high-fiber foods to your diet. Eat a variety of high-fiber foods instead of only a select few type of foods.  PURPOSE  To increase stool bulk.  To make bowel movements more regular to prevent constipation.  To lower cholesterol.  To prevent overeating. WHEN IS THIS DIET USED?  It may be used if you have constipation and hemorrhoids.  It may be used if you have uncomplicated diverticulosis (intestine condition) and irritable bowel syndrome.  It may be used if you need help with weight management.  It may be used if you want to add it to your diet as a protective measure against atherosclerosis, diabetes, and cancer. SOURCES OF FIBER  Whole-grain breads and cereals.  Fruits, such as apples, oranges, bananas, berries, prunes, and pears.  Vegetables, such as green peas, carrots, sweet potatoes, beets, broccoli, cabbage, spinach, and artichokes.  Legumes, such split peas, soy, lentils.  Almonds. FIBER CONTENT IN FOODS Starches and Grains / Dietary Fiber (g)  Cheerios, 1 cup / 3 g  Corn Flakes cereal, 1 cup / 0.7 g  Rice crispy treat cereal, 1 cup / 0.3 g  Instant oatmeal (cooked),  cup / 2 g  Frosted wheat cereal, 1 cup / 5.1 g  Brown, long-grain rice (cooked), 1 cup / 3.5 g  White, long-grain rice (cooked), 1 cup / 0.6 g  Enriched macaroni (cooked), 1 cup / 2.5 g Legumes / Dietary Fiber (g)  Baked beans (canned, plain, or vegetarian),  cup / 5.2 g  Kidney beans (canned),  cup / 6.8 g  Pinto beans (cooked),  cup / 5.5 g Breads and Crackers  / Dietary Fiber (g)  Plain or honey graham crackers, 2 squares / 0.7 g  Saltine crackers, 3 squares / 0.3 g  Plain, salted pretzels, 10 pieces / 1.8 g  Whole-wheat bread, 1 slice / 1.9 g  White bread, 1 slice / 0.7 g  Raisin bread, 1 slice / 1.2 g  Plain bagel, 3 oz / 2 g  Flour tortilla, 1 oz / 0.9 g  Corn tortilla, 1 small / 1.5 g  Hamburger or hotdog bun, 1 small / 0.9 g Fruits / Dietary Fiber (g)  Apple with skin, 1 medium / 4.4 g  Sweetened applesauce,  cup / 1.5 g  Banana,  medium / 1.5 g  Grapes, 10 grapes / 0.4 g  Orange, 1 small / 2.3 g  Raisin, 1.5 oz / 1.6 g  Melon, 1 cup / 1.4 g Vegetables / Dietary Fiber (g)  Green beans (canned),  cup / 1.3 g  Carrots (cooked),  cup / 2.3 g  Broccoli (cooked),  cup / 2.8 g  Peas (cooked),  cup / 4.4 g  Mashed potatoes,  cup / 1.6 g  Lettuce, 1 cup / 0.5 g  Corn (canned),  cup / 1.6 g  Tomato,  cup / 1.1 g Document Released: 09/14/2005 Document Revised: 03/15/2012 Document Reviewed: 12/17/2011 Exit[employer] Patient Information 2014 [employer]. Fiber Content in Foods Drinking plenty of fluids and consuming foods high in fiber can help   with constipation. See the list below for the fiber content of some common foods. Starches and Grains / Dietary Fiber (g)  Cheerios, 1 cup / 3 g  Kellogg's Corn Flakes, 1 cup / 0.7 g  Rice Krispies, 1  cup / 0.3 g  Quaker Oat Life Cereal,  cup / 2.1 g  Oatmeal, instant (cooked),  cup / 2 g  Kellogg's Frosted Mini Wheats, 1 cup / 5.1 g  Rice, brown, long-grain (cooked), 1 cup / 3.5 g  Rice, white, long-grain (cooked), 1 cup / 0.6 g  Macaroni, cooked, enriched, 1 cup / 2.5 g Legumes / Dietary Fiber (g)  Beans, baked, canned, plain or vegetarian,  cup / 5.2 g  Beans, kidney, canned,  cup / 6.8 g  Beans, pinto, dried (cooked),  cup / 7.7 g  Beans, pinto, canned,  cup / 5.5 g Breads and Crackers / Dietary Fiber (g)  Graham crackers, plain or  honey, 2 squares / 0.7 g  Saltine crackers, 3 squares / 0.3 g  Pretzels, plain, salted, 10 pieces / 1.8 g  Bread, whole-wheat, 1 slice / 1.9 g  Bread, white, 1 slice / 0.7 g  Bread, raisin, 1 slice / 1.2 g  Bagel, plain, 3 oz / 2 g  Tortilla, flour, 1 oz / 0.9 g  Tortilla, corn, 1 small / 1.5 g  Bun, hamburger or hotdog, 1 small / 0.9 g Fruits / Dietary Fiber (g)  Apple, raw with skin, 1 medium / 4.4 g  Applesauce, sweetened,  cup / 1.5 g  Banana,  medium / 1.5 g  Grapes, 10 grapes / 0.4 g  Orange, 1 small / 2.3 g  Raisin, 1.5 oz / 1.6 g  Melon, 1 cup / 1.4 g Vegetables / Dietary Fiber (g)  Green beans, canned,  cup / 1.3 g  Carrots (cooked),  cup / 2.3 g  Broccoli (cooked),  cup / 2.8 g  Peas, frozen (cooked),  cup / 4.4 g  Potatoes, mashed,  cup / 1.6 g  Lettuce, 1 cup / 0.5 g  Corn, canned,  cup / 1.6 g  Tomato,  cup / 1.1 g Document Released: 01/31/2007 Document Revised: 12/07/2011 Document Reviewed: 03/28/2007 Exit[employer] Patient Information 2014 [employer].  

## 2013-09-27 ENCOUNTER — Encounter: Payer: Self-pay | Admitting: Nurse Practitioner

## 2013-09-27 DIAGNOSIS — K219 Gastro-esophageal reflux disease without esophagitis: Secondary | ICD-10-CM | POA: Insufficient documentation

## 2013-09-27 NOTE — Assessment & Plan Note (Signed)
Plan: Continue current medications as directed. Encouraged high fiber diet with plenty of fluids. Reviewed symptomatic care for constipation Such as MiraLAX. Recheck in 6 months, call back sooner if any problems. 

## 2013-09-27 NOTE — Progress Notes (Signed)
Subjective:  Presents for followup on her blood pressure. Running 130/70 outside office. Was 120/73 at her dentist recently. Was seen for sinus symptoms last week, much improved. No chest pain shortness of breath or edema. Acid reflux is stable. Has lost about 28 pounds through TOPS. Does have some mild constipation at times. No blood in her stool. Colonoscopy is up-to-date.  Objective:   BP 134/84  Ht 5\' 2"  (1.575 m)  Wt 196 lb (88.905 kg)  BMI 35.84 kg/m2 NAD. Alert, oriented. Lungs clear. Heart regular rate rhythm. Lower extremities no edema. Abdomen soft nondistended nontender.  Assessment:Hypertension  GERD (gastroesophageal reflux disease)  Unspecified constipation  Plan: Continue current medications as directed. Encouraged high fiber diet with plenty of fluids. Reviewed symptomatic care for constipation Such as MiraLAX. Recheck in 6 months, call back sooner if any problems.

## 2013-09-27 NOTE — Assessment & Plan Note (Signed)
Plan: Continue current medications as directed. Encouraged high fiber diet with plenty of fluids. Reviewed symptomatic care for constipation Such as MiraLAX. Recheck in 6 months, call back sooner if any problems.

## 2013-11-09 ENCOUNTER — Telehealth: Payer: Self-pay | Admitting: Family Medicine

## 2013-11-09 NOTE — Telephone Encounter (Signed)
Pt called late yesterday, wants appointment for after 4:00 this week for dizzy spells that started Tuesday and was worse yesterday, checked BP 114/74, explained to patient that we are not allowed to schedule appointments after 4:00, pt states that she has a point system at work and that Eber JonesCarolyn has in the past approved for pt to have appointment after 4:00, again explained that we are not supposed to do this due to not being able to do for other patients in the same situation, but will ask  Please advise so we may notify pt [phone])

## 2013-11-09 NOTE — Telephone Encounter (Signed)
Pt took appt for 11/10/13 @ 3:00

## 2013-11-09 NOTE — Telephone Encounter (Signed)
Has appt for 3 pm Friday.

## 2013-11-10 ENCOUNTER — Encounter: Payer: Self-pay | Admitting: Nurse Practitioner

## 2013-11-10 ENCOUNTER — Ambulatory Visit (INDEPENDENT_AMBULATORY_CARE_PROVIDER_SITE_OTHER): Payer: 59 | Admitting: Nurse Practitioner

## 2013-11-10 VITALS — BP 116/76 | HR 93 | Temp 98.0°F | Ht 62.0 in | Wt 188.0 lb

## 2013-11-10 DIAGNOSIS — J0100 Acute maxillary sinusitis, unspecified: Secondary | ICD-10-CM

## 2013-11-10 DIAGNOSIS — H8110 Benign paroxysmal vertigo, unspecified ear: Secondary | ICD-10-CM

## 2013-11-10 DIAGNOSIS — I10 Essential (primary) hypertension: Secondary | ICD-10-CM

## 2013-11-10 MED ORDER — INDAPAMIDE 2.5 MG PO TABS: ORAL_TABLET | ORAL | Status: DC

## 2013-11-10 MED ORDER — METHYLPREDNISOLONE ACETATE 40 MG/ML IJ SUSP
40.0000 mg | Freq: Once | INTRAMUSCULAR | Status: AC
  Administered 2013-11-10: 40 mg via INTRAMUSCULAR

## 2013-11-10 MED ORDER — AMOXICILLIN-POT CLAVULANATE 875-125 MG PO TABS: 1.0000 | ORAL_TABLET | Freq: Two times a day (BID) | ORAL | Status: DC

## 2013-11-10 MED ORDER — LORATADINE 10 MG PO TABS: 10.0000 mg | ORAL_TABLET | Freq: Every day | ORAL | Status: DC | PRN

## 2013-11-10 NOTE — Patient Instructions (Addendum)
Loratadine 10 mg each day as needed Nasacort AQ as directed

## 2013-11-16 ENCOUNTER — Encounter: Payer: Self-pay | Admitting: Nurse Practitioner

## 2013-11-16 NOTE — Progress Notes (Signed)
Subjective:  Presents with complaints of off-and-on dizziness for the past few days. Maxillary area sinus pressure. Bilateral ear pressure. No fever. Cough mainly in the morning. Producing slight color at times. No numbness or weakness of the face arms or legs. No difficulty speaking or swallowing. Noticed some dizziness walking to her car from work one day recently. Has lost over 30 pounds on TOPS. Clear drainage from the left eye.  Objective:   BP 116/76  Pulse 93  Temp(Src) 98 F (36.7 C) (Oral)  Ht 5\' 2"  (1.575 m)  Wt 188 lb (85.276 kg)  BMI 34.38 kg/m2 NAD. Alert, oriented. TMs significant clear effusion, no erythema. Pharynx injected with PND noted. Neck supple with mild soft anterior adenopathy. Lungs clear. Heart regular rate rhythm. No murmur or gallop noted. BP on recheck sitting 114/74. Orthostatic BP and pulse stable. Pupils equal and reactive. Conjunctiva clear. Point-to-point localization normal limit. EOMs intact without nystagmus. Muscle strength 5+ bilateral upper extremities. Reflexes normal limit. Romberg negative.  Assessment: Maxillary sinusitis, acute - Plan: methylPREDNISolone acetate (DEPO-MEDROL) injection 40 mg  Benign paroxysmal positional vertigo  Hypertension  Plan: Meds ordered this encounter  Medications  . amoxicillin-clavulanate (AUGMENTIN) 875-125 MG per tablet    Sig: Take 1 tablet by mouth 2 (two) times daily.    Dispense:  20 tablet    Refill:  0    Order Specific Question:  Supervising Provider    Answer:  Merlyn AlbertLUKING, WILLIAM S [[id number]]  . loratadine (CLARITIN) 10 MG tablet    Sig: Take 1 tablet (10 mg total) by mouth daily as needed. FOR ALLERGIES    Dispense:  90 tablet    Refill:  1    Order Specific Question:  Supervising Provider    Answer:  Merlyn AlbertLUKING, WILLIAM S [[id number]]  . methylPREDNISolone acetate (DEPO-MEDROL) injection 40 mg    Sig:   . indapamide (LOZOL) 2.5 MG tablet    Sig: Decrease to 1/2 tab po qd    Dispense:  30 tablet    Refill:  5    Order Specific Question:  Supervising Provider    Answer:  Merlyn AlbertLUKING, WILLIAM S [[id number]]   feel that vertigo is most likely due to inner ear dysfunction related to her sinus infection or possible orthostatic hypotension. Was significant weight loss we'll back off on Lozol. Warning signs reviewed. Recommend OTC meclizine as directed. Call back if worsens or persists.

## 2013-12-22 ENCOUNTER — Other Ambulatory Visit: Payer: Self-pay | Admitting: Nurse Practitioner

## 2013-12-22 ENCOUNTER — Other Ambulatory Visit: Payer: Self-pay | Admitting: Family Medicine

## 2014-01-01 ENCOUNTER — Other Ambulatory Visit (HOSPITAL_COMMUNITY): Payer: Self-pay | Admitting: Hematology and Oncology

## 2014-01-31 ENCOUNTER — Telehealth: Payer: Self-pay | Admitting: Family Medicine

## 2014-01-31 NOTE — Telephone Encounter (Signed)
Pt requesting Jessica Kirby order labs for her, she's been on a diet and wants to make sure her levels are ok Would like Thyroid, Calcium, & Potassium checked, please call pt when orders are done

## 2014-02-01 ENCOUNTER — Telehealth: Payer: Self-pay | Admitting: Family Medicine

## 2014-02-01 NOTE — Telephone Encounter (Signed)
Patient is due for a medication check and said she was told by Eber Jonesarolyn that she could schedule her appointments after 4 pm, which is our last appointment for a chronic visit. I told her that I was not able to schedule her an appointment past 4pm and she asked that I send a message back to [place] asking if we could schedule her appointments past 4. I believe we had discussed this with Beth before and she said we couldn't do this because it was not fair to the other patients, but patient insisted we get [name]'s opinion.

## 2014-02-02 NOTE — Telephone Encounter (Signed)
Jessica MccreedyBarbara, this is the only patient that I have done this for. Her employer is being really strict about missed hours including leaving early for appt. I will speak to her about doing a FMLA to see if this will help. Please go ahead and schedule her for 4:20 slot. You can use another slot for "same day" if needed. Thanks! Eber Jonesarolyn

## 2014-02-05 ENCOUNTER — Other Ambulatory Visit: Payer: Self-pay | Admitting: Nurse Practitioner

## 2014-02-05 ENCOUNTER — Ambulatory Visit: Payer: 59 | Admitting: Orthopedic Surgery

## 2014-02-05 DIAGNOSIS — M8580 Other specified disorders of bone density and structure, unspecified site: Secondary | ICD-10-CM

## 2014-02-05 DIAGNOSIS — E039 Hypothyroidism, unspecified: Secondary | ICD-10-CM

## 2014-02-05 DIAGNOSIS — E876 Hypokalemia: Secondary | ICD-10-CM

## 2014-02-05 NOTE — Telephone Encounter (Signed)
Patient notified via VM that labs were ordered

## 2014-02-05 NOTE — Telephone Encounter (Signed)
Labs ordered.

## 2014-02-06 NOTE — Telephone Encounter (Signed)
appt scheduled as per your request.

## 2014-02-07 ENCOUNTER — Ambulatory Visit (INDEPENDENT_AMBULATORY_CARE_PROVIDER_SITE_OTHER): Payer: 59 | Admitting: Nurse Practitioner

## 2014-02-07 ENCOUNTER — Encounter: Payer: Self-pay | Admitting: Nurse Practitioner

## 2014-02-07 VITALS — BP 140/82 | Ht 62.0 in | Wt 183.0 lb

## 2014-02-07 DIAGNOSIS — F341 Dysthymic disorder: Secondary | ICD-10-CM

## 2014-02-07 DIAGNOSIS — F418 Other specified anxiety disorders: Secondary | ICD-10-CM

## 2014-02-07 DIAGNOSIS — I10 Essential (primary) hypertension: Secondary | ICD-10-CM

## 2014-02-07 DIAGNOSIS — E039 Hypothyroidism, unspecified: Secondary | ICD-10-CM

## 2014-02-08 ENCOUNTER — Encounter: Payer: Self-pay | Admitting: Nurse Practitioner

## 2014-02-08 ENCOUNTER — Telehealth: Payer: Self-pay | Admitting: Family Medicine

## 2014-02-08 LAB — TSH: TSH: 2.235 u[IU]/mL (ref 0.350–4.500)

## 2014-02-08 LAB — POTASSIUM: POTASSIUM: 3.9 meq/L (ref 3.5–5.3)

## 2014-02-08 LAB — CALCIUM: Calcium: 9.4 mg/dL (ref 8.4–10.5)

## 2014-02-08 NOTE — Telephone Encounter (Signed)
Labs were all normal. Sent a letter with results.

## 2014-02-08 NOTE — Telephone Encounter (Signed)
Pt would like you to call her with her lab results.  States that you told her yesterday that you might get to review it today since you're off tomorrow

## 2014-02-09 NOTE — Telephone Encounter (Signed)
Left VM stating labs were all normal. Sent a letter with results.

## 2014-02-15 ENCOUNTER — Ambulatory Visit: Payer: 59 | Admitting: Orthopedic Surgery

## 2014-02-19 ENCOUNTER — Encounter: Payer: Self-pay | Admitting: Nurse Practitioner

## 2014-02-19 NOTE — Progress Notes (Signed)
Subjective:  Presents for recheck. Doing well with diet and weight loss. Compliant with meds. No CP/ischemic type pain or SOB. Rare use of Klonopin for anxiety.   Objective:   BP 140/82  Ht 5\' 2"  (1.575 m)  Wt 183 lb (83.008 kg)  BMI 33.46 kg/m2 NAD. Alert, oriented. Cheerful affect. Thyroid: no masses or goiter noted. Lungs clear. Heart RRR. Lower extremities: no edema.    Assessment:  Problem List Items Addressed This Visit     Cardiovascular and Mediastinum   Hypertension - Primary     Endocrine   Hypothyroidism (Chronic)     Other   Depression with anxiety     Plan: encouraged patient to get labs done. Recommend continued weight loss and regular exercise.  Return in about 3 months (around 05/10/2014).

## 2014-03-29 ENCOUNTER — Other Ambulatory Visit: Payer: Self-pay | Admitting: Family Medicine

## 2014-04-02 ENCOUNTER — Ambulatory Visit: Payer: BC Managed Care – PPO | Admitting: Orthopedic Surgery

## 2014-04-24 ENCOUNTER — Ambulatory Visit (INDEPENDENT_AMBULATORY_CARE_PROVIDER_SITE_OTHER): Payer: BC Managed Care – PPO | Admitting: Family Medicine

## 2014-04-24 ENCOUNTER — Encounter: Payer: Self-pay | Admitting: Family Medicine

## 2014-04-24 VITALS — BP 130/82 | Ht 62.0 in | Wt 186.2 lb

## 2014-04-24 DIAGNOSIS — J329 Chronic sinusitis, unspecified: Secondary | ICD-10-CM

## 2014-04-24 MED ORDER — AMOXICILLIN-POT CLAVULANATE 875-125 MG PO TABS: 1.0000 | ORAL_TABLET | Freq: Two times a day (BID) | ORAL | Status: AC

## 2014-04-24 NOTE — Progress Notes (Signed)
   Subjective:    Patient ID: Jessica Kirby, female    DOB: 1956/02/09, 58 y.o.   MRN: [id number]  Sinus Problem This is a new problem. The current episode started in the past 7 days. Associated symptoms include chills, congestion, sinus pressure and sneezing. Past treatments include oral decongestants.   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  Low-grade fever and achy.   Review of Systems  Constitutional: Positive for chills.  HENT: Positive for congestion, sinus pressure and sneezing.        Objective:   Physical Exam  Alert moderate malaise. Hydration good. Vital stable. HEENT moderate his congestion frontal tenderness. Pharynx normal neck supple. Lungs clear. Heart regular in rhythm.      Assessment & Plan:  Impression 1 rhinosinusitis plan antibiotics prescribed. Symptomatic care discussed. Warning signs discussed. WSL

## 2014-04-26 DIAGNOSIS — Z0289 Encounter for other administrative examinations: Secondary | ICD-10-CM

## 2014-04-27 ENCOUNTER — Telehealth: Payer: Self-pay | Admitting: Family Medicine

## 2014-04-27 MED ORDER — LEVOFLOXACIN 500 MG PO TABS: 500.0000 mg | ORAL_TABLET | Freq: Every day | ORAL | Status: DC

## 2014-04-27 NOTE — Telephone Encounter (Signed)
Med sent to pharm. Pt notified.  

## 2014-04-27 NOTE — Telephone Encounter (Signed)
Patient said that her throat is scratchy and raw. She said the congestion is still there and tight with a dry cough. She was seen earlier this week for a sinus infection. She hasn't finished her antibiotic yet. She is concerned because she isn't getting better. Please advise.   [facility]

## 2014-04-27 NOTE — Telephone Encounter (Signed)
Change to lev 500 qd for ten d

## 2014-04-30 ENCOUNTER — Other Ambulatory Visit: Payer: Self-pay | Admitting: Family Medicine

## 2014-04-30 ENCOUNTER — Telehealth: Payer: Self-pay | Admitting: Family Medicine

## 2014-04-30 ENCOUNTER — Encounter: Payer: Self-pay | Admitting: Family Medicine

## 2014-04-30 NOTE — Telephone Encounter (Signed)
Work note done up front

## 2014-04-30 NOTE — Telephone Encounter (Signed)
ok 

## 2014-04-30 NOTE — Telephone Encounter (Signed)
Pt needs work note.  She left early Saturday 04/28/14, to return to work 05/01/14, note must state with no restrictions, pt states Dr. Brett CanalesSteve told her if she needed note to just call

## 2014-05-07 ENCOUNTER — Ambulatory Visit: Payer: 59 | Admitting: Nurse Practitioner

## 2014-05-09 ENCOUNTER — Encounter: Payer: Self-pay | Admitting: Family Medicine

## 2014-05-09 ENCOUNTER — Ambulatory Visit (INDEPENDENT_AMBULATORY_CARE_PROVIDER_SITE_OTHER): Payer: BC Managed Care – PPO | Admitting: Nurse Practitioner

## 2014-05-09 ENCOUNTER — Other Ambulatory Visit: Payer: Self-pay | Admitting: Family Medicine

## 2014-05-09 ENCOUNTER — Encounter: Payer: Self-pay | Admitting: Nurse Practitioner

## 2014-05-09 VITALS — BP 122/80 | Ht 62.0 in | Wt 182.1 lb

## 2014-05-09 DIAGNOSIS — E039 Hypothyroidism, unspecified: Secondary | ICD-10-CM

## 2014-05-09 DIAGNOSIS — I10 Essential (primary) hypertension: Secondary | ICD-10-CM

## 2014-05-09 DIAGNOSIS — Z79899 Other long term (current) drug therapy: Secondary | ICD-10-CM

## 2014-05-09 MED ORDER — INDAPAMIDE 2.5 MG PO TABS: ORAL_TABLET | ORAL | Status: DC

## 2014-05-11 ENCOUNTER — Encounter: Payer: Self-pay | Admitting: Nurse Practitioner

## 2014-05-11 NOTE — Progress Notes (Signed)
Subjective:  Presents for routine followup. Weight is stable. Low-sodium diet. Job is very active, minimal exercise. Has done fairly well with her diet. No chest pain shortness of breath. No orthopnea. Mild edema especially when she is on her feet a lot.  Objective:   BP 122/80  Ht 5\' 2"  (1.575 m)  Wt 182 lb 2 oz (82.611 kg)  BMI 33.30 kg/m2 NAD. Alert, oriented. Lungs clear. Heart regular rate rhythm. Trace pitting edema lower extremities.  Assessment:  Problem List Items Addressed This Visit     Cardiovascular and Mediastinum   Hypertension   Relevant Medications      indapamide (LOZOL) tablet   Other Relevant Orders      Lipid panel     Endocrine   Hypothyroidism - Primary (Chronic)   Relevant Orders      TSH    Other Visit Diagnoses   Encounter for long-term (current) use of other medications        Relevant Orders       Hepatic function panel       Basic metabolic panel       Plan:  Meds ordered this encounter  Medications  . indapamide (LOZOL) 2.5 MG tablet    Sig: 1/2 - 1 po qd for swelling    Dispense:  30 tablet    Refill:  5    Order Specific Question:  Supervising Provider    Answer:  Merlyn AlbertLUKING, WILLIAM S [[id number]]   Recommend followup at the end of September for routine lab work and physical, orders placed today. Call back sooner if any problems.

## 2014-06-19 ENCOUNTER — Other Ambulatory Visit: Payer: Self-pay | Admitting: Family Medicine

## 2014-06-19 DIAGNOSIS — Z139 Encounter for screening, unspecified: Secondary | ICD-10-CM

## 2014-06-25 ENCOUNTER — Ambulatory Visit (HOSPITAL_COMMUNITY)
Admission: RE | Admit: 2014-06-25 | Discharge: 2014-06-25 | Disposition: A | Discharge status: Home / Self Care | Payer: BC Managed Care – PPO | Source: Ambulatory Visit | Attending: Family Medicine | Admitting: Family Medicine

## 2014-06-25 DIAGNOSIS — Z1231 Encounter for screening mammogram for malignant neoplasm of breast: Secondary | ICD-10-CM | POA: Insufficient documentation

## 2014-06-25 DIAGNOSIS — Z139 Encounter for screening, unspecified: Secondary | ICD-10-CM

## 2014-06-25 LAB — BASIC METABOLIC PANEL
BUN: 11 mg/dL (ref 6–23)
CHLORIDE: 108 meq/L (ref 96–112)
CO2: 29 mEq/L (ref 19–32)
CREATININE: 0.76 mg/dL (ref 0.50–1.10)
Calcium: 9.2 mg/dL (ref 8.4–10.5)
Glucose, Bld: 95 mg/dL (ref 70–99)
Potassium: 4 mEq/L (ref 3.5–5.3)
Sodium: 146 mEq/L — ABNORMAL HIGH (ref 135–145)

## 2014-06-25 LAB — HEPATIC FUNCTION PANEL
ALK PHOS: 58 U/L (ref 39–117)
ALT: 12 U/L (ref 0–35)
AST: 16 U/L (ref 0–37)
Albumin: 4.1 g/dL (ref 3.5–5.2)
Bilirubin, Direct: 0.1 mg/dL (ref 0.0–0.3)
Indirect Bilirubin: 0.8 mg/dL (ref 0.2–1.2)
TOTAL PROTEIN: 6.3 g/dL (ref 6.0–8.3)
Total Bilirubin: 0.9 mg/dL (ref 0.2–1.2)

## 2014-06-25 LAB — LIPID PANEL
Cholesterol: 190 mg/dL (ref 0–200)
HDL: 49 mg/dL (ref >39)
LDL Cholesterol: 124 mg/dL — ABNORMAL HIGH (ref 0–99)
TRIGLYCERIDES: 84 mg/dL (ref <150)
Total CHOL/HDL Ratio: 3.9 Ratio
VLDL: 17 mg/dL (ref 0–40)

## 2014-06-25 LAB — TSH: TSH: 3.541 u[IU]/mL (ref 0.350–4.500)

## 2014-06-27 ENCOUNTER — Ambulatory Visit (INDEPENDENT_AMBULATORY_CARE_PROVIDER_SITE_OTHER): Payer: BC Managed Care – PPO | Admitting: Nurse Practitioner

## 2014-06-27 ENCOUNTER — Encounter: Payer: Self-pay | Admitting: Nurse Practitioner

## 2014-06-27 VITALS — BP 126/84 | Ht 62.0 in | Wt 186.0 lb

## 2014-06-27 DIAGNOSIS — Z0000 Encounter for general adult medical examination without abnormal findings: Secondary | ICD-10-CM

## 2014-06-27 DIAGNOSIS — Z13820 Encounter for screening for osteoporosis: Secondary | ICD-10-CM

## 2014-06-27 DIAGNOSIS — Z01419 Encounter for gynecological examination (general) (routine) without abnormal findings: Secondary | ICD-10-CM

## 2014-06-28 ENCOUNTER — Encounter: Payer: Self-pay | Admitting: Nurse Practitioner

## 2014-06-28 NOTE — Progress Notes (Signed)
   Subjective:    Patient ID: Jessica Kirby, female    DOB: 1956-07-31, 58 y.o.   MRN: [id number]  HPI presents for her wellness physical. Regular dental and vision exams. Regular walking program. Continues to work on weight loss. Married, same sexual partner.     Review of Systems  Constitutional: Negative for fever, activity change, appetite change and fatigue.  HENT: Positive for hearing loss. Negative for dental problem, ear pain, sinus pressure and sore throat.   Respiratory: Negative for cough, chest tightness, shortness of breath and wheezing.   Cardiovascular: Negative for chest pain.  Gastrointestinal: Negative for nausea, vomiting, abdominal pain, diarrhea, constipation and abdominal distention.  Genitourinary: Negative for dysuria, urgency, frequency, vaginal discharge, enuresis, difficulty urinating, genital sores and pelvic pain.       Objective:   Physical Exam  Vitals reviewed. Constitutional: She is oriented to person, place, and time. She appears well-developed. No distress.  HENT:  Right Ear: External ear normal.  Left Ear: External ear normal.  Mouth/Throat: Oropharynx is clear and moist.  Neck: Normal range of motion. Neck supple. No tracheal deviation present. No thyromegaly present.  Cardiovascular: Normal rate, regular rhythm and normal heart sounds.  Exam reveals no gallop.   No murmur heard. Pulmonary/Chest: Effort normal and breath sounds normal.  Abdominal: Soft. She exhibits no distension. There is no tenderness.  Genitourinary: Vagina normal. No vaginal discharge found.  External GU: No rashes or lesions. Vagina slightly pale and moist, no discharge. Rectal exam no masses noted, no stool for Hemoccult.  Musculoskeletal: She exhibits no edema.  Lymphadenopathy:    She has no cervical adenopathy.  Neurological: She is alert and oriented to person, place, and time.  Skin: Skin is warm and dry. No rash noted.  Psychiatric: She has a normal mood and  affect. Her behavior is normal.   Breast exam: No masses noted; axilla no adenopathy.        Assessment & Plan:  Well woman exam - Plan: POC Hemoccult Bld/Stl (3-Cd Home Screen), POC Hemoccult Bld/Stl (3-Cd Home Screen)  Screening for osteoporosis - Plan: DG Bone Density  Recommend healthy diet, continue regular activity, continued weight loss and daily vitamin D and calcium supplementation. Given prescription and information for Zostavax. Return in about 6 months (around 12/26/2014).

## 2014-07-02 ENCOUNTER — Other Ambulatory Visit: Payer: Self-pay | Admitting: Nurse Practitioner

## 2014-08-08 ENCOUNTER — Other Ambulatory Visit: Payer: Self-pay | Admitting: Family Medicine

## 2014-09-27 ENCOUNTER — Encounter: Payer: Self-pay | Admitting: Family Medicine

## 2014-09-27 ENCOUNTER — Ambulatory Visit (INDEPENDENT_AMBULATORY_CARE_PROVIDER_SITE_OTHER): Payer: BC Managed Care – PPO | Admitting: Family Medicine

## 2014-09-27 ENCOUNTER — Ambulatory Visit (HOSPITAL_COMMUNITY)
Admission: RE | Admit: 2014-09-27 | Discharge: 2014-09-27 | Disposition: A | Discharge status: Home / Self Care | Payer: BC Managed Care – PPO | Source: Ambulatory Visit | Attending: Family Medicine | Admitting: Family Medicine

## 2014-09-27 VITALS — BP 138/72 | Ht 62.0 in | Wt 193.0 lb

## 2014-09-27 DIAGNOSIS — R7989 Other specified abnormal findings of blood chemistry: Secondary | ICD-10-CM

## 2014-09-27 DIAGNOSIS — M79605 Pain in left leg: Secondary | ICD-10-CM | POA: Diagnosis not present

## 2014-09-27 DIAGNOSIS — R609 Edema, unspecified: Secondary | ICD-10-CM | POA: Diagnosis not present

## 2014-09-27 DIAGNOSIS — M25562 Pain in left knee: Secondary | ICD-10-CM

## 2014-09-27 DIAGNOSIS — M8580 Other specified disorders of bone density and structure, unspecified site: Secondary | ICD-10-CM

## 2014-09-27 DIAGNOSIS — R791 Abnormal coagulation profile: Secondary | ICD-10-CM

## 2014-09-27 LAB — D-DIMER, QUANTITATIVE: D-Dimer, Quant: 0.56 ug/mL-FEU — ABNORMAL HIGH (ref 0.00–0.48)

## 2014-09-27 MED ORDER — NAPROXEN 500 MG PO TABS: 500.0000 mg | ORAL_TABLET | Freq: Two times a day (BID) | ORAL | Status: DC

## 2014-09-27 NOTE — Addendum Note (Signed)
Addended byOneal Deputy: Chasidy Janak D on: 09/27/2014 01:24 PM   Modules accepted: Orders

## 2014-09-27 NOTE — Progress Notes (Signed)
   Subjective:    Patient ID: Melton AlarKathy S Troeger, female    DOB: 10/12/1955, 58 y.o.   MRN: [id number]  Leg Pain  The incident occurred more than 1 week ago. There was no injury mechanism. The pain is present in the left knee (left lower leg). Associated symptoms include an inability to bear weight. The symptoms are aggravated by weight bearing. She has tried ice and acetaminophen for the symptoms. The treatment provided no relief.   Patient states this started off approximate 2 weeks ago. She states several weeks ago she hit the back of her leg at home near her ankle but over the past couple weeks she is noted left lateral knee pain and calf pain along with some swelling tenderness and pain with walking   Review of Systems    patient denies any sharp chest pain shortness of breath. Objective:   Physical Exam Lungs are clear hearts regular She has tenderness behind the left knee she also has tenderness in the calf. Patient limps when she walks. She has about a quarter inch increase in circumference of the left calf compared to the right calf. She also has tenderness along the lateral collateral ligament region. Ligaments are stable.       Assessment & Plan:  Left leg pain discomfort concerning for the possibility of DVT versus ligamentous strain.  Ultrasound was ordered. Came back negative for DVT  Anti-inflammatories recommended twice daily along with stretching exercises follow-up with our office within the next 2 weeks. If progressive trouble may need orthopedic referral and physical therapy  Patient is also do bone density test so far has not been able to tolerate oral biphosphonate's may have to use intravenous

## 2014-10-03 ENCOUNTER — Other Ambulatory Visit: Payer: Self-pay | Admitting: *Deleted

## 2014-10-03 DIAGNOSIS — R7989 Other specified abnormal findings of blood chemistry: Secondary | ICD-10-CM

## 2014-10-03 DIAGNOSIS — M7989 Other specified soft tissue disorders: Secondary | ICD-10-CM

## 2014-10-04 ENCOUNTER — Ambulatory Visit (HOSPITAL_COMMUNITY)
Admission: RE | Admit: 2014-10-04 | Discharge: 2014-10-04 | Disposition: A | Discharge status: Home / Self Care | Payer: BLUE CROSS/BLUE SHIELD | Source: Ambulatory Visit | Attending: Family Medicine | Admitting: Family Medicine

## 2014-10-04 DIAGNOSIS — M7989 Other specified soft tissue disorders: Secondary | ICD-10-CM | POA: Insufficient documentation

## 2014-10-04 NOTE — Progress Notes (Signed)
Patient notified and verbalized understanding of test results. No further questions.  

## 2014-10-08 ENCOUNTER — Telehealth: Payer: Self-pay | Admitting: Family Medicine

## 2014-10-08 NOTE — Telephone Encounter (Signed)
This is a grey area, ive seen it doen by both the specialists and by the primary  Care docs. Since it is spousal we can do it and need general info from [facility] so you can fill out answeres to questions, of course be sure pt pays for this since sig work on your part

## 2014-10-08 NOTE — Telephone Encounter (Signed)
Patient wants to know if we are suppose to fill out her FMLA papers for her to go with spouse to his different specialist appointments. This started back 12/23/13 with spouse going to his cardiology appointment. She states her job told her to bring paper work here for primary doctor to fill out but if we didn't refer spouse to these didn't specialist are we responsible for filling out paperwork. Spouse has appointment to see  Dr.Hawkins but our our didn't refer him their another specialist did.What do I tell patient.

## 2014-10-11 ENCOUNTER — Other Ambulatory Visit: Payer: Self-pay | Admitting: Family Medicine

## 2014-10-12 NOTE — Telephone Encounter (Signed)
Ok plus three monthly ref 

## 2014-10-12 NOTE — Telephone Encounter (Signed)
Physical was on 06/27/14

## 2014-10-24 ENCOUNTER — Other Ambulatory Visit (HOSPITAL_COMMUNITY): Payer: BC Managed Care – PPO

## 2014-11-21 ENCOUNTER — Telehealth: Payer: Self-pay | Admitting: Family Medicine

## 2014-11-21 MED ORDER — AZITHROMYCIN 250 MG PO TABS: ORAL_TABLET | ORAL | Status: DC

## 2014-11-21 NOTE — Telephone Encounter (Signed)
[facility]

## 2014-11-21 NOTE — Telephone Encounter (Signed)
Pt called stating that she has congestion,cough,and a scratchy throat. Pt stated that husband just had a lung biopsy done and she is afraid  That she is going to give it to him. Pt is scared to come in to the office  To be seen as well. Pt wants to know if something can be called in.

## 2014-11-21 NOTE — Telephone Encounter (Signed)
zpk

## 2014-12-18 ENCOUNTER — Other Ambulatory Visit: Payer: Self-pay | Admitting: Family Medicine

## 2014-12-18 ENCOUNTER — Other Ambulatory Visit: Payer: Self-pay | Admitting: Nurse Practitioner

## 2014-12-26 ENCOUNTER — Ambulatory Visit: Payer: Self-pay | Admitting: Nurse Practitioner

## 2015-01-01 ENCOUNTER — Telehealth (INDEPENDENT_AMBULATORY_CARE_PROVIDER_SITE_OTHER): Payer: Self-pay | Admitting: *Deleted

## 2015-01-01 NOTE — Telephone Encounter (Signed)
error 

## 2015-01-08 ENCOUNTER — Telehealth: Payer: Self-pay | Admitting: Nurse Practitioner

## 2015-01-08 NOTE — Telephone Encounter (Signed)
Pt wants to know if she can go back on the xanax that the other meds Aren't working.

## 2015-01-08 NOTE — Telephone Encounter (Signed)
Pt states the Klonopin is not working as well as the Xanax. Pt would like to d/c the Klonopin and go back to the Xanax.

## 2015-01-08 NOTE — Telephone Encounter (Signed)
LMRC

## 2015-01-09 ENCOUNTER — Other Ambulatory Visit: Payer: Self-pay | Admitting: Nurse Practitioner

## 2015-01-09 MED ORDER — ALPRAZOLAM 0.5 MG PO TABS: 0.5000 mg | ORAL_TABLET | Freq: Every evening | ORAL | Status: DC | PRN

## 2015-02-03 ENCOUNTER — Other Ambulatory Visit: Payer: Self-pay | Admitting: Family Medicine

## 2015-02-14 ENCOUNTER — Ambulatory Visit (INDEPENDENT_AMBULATORY_CARE_PROVIDER_SITE_OTHER): Payer: BLUE CROSS/BLUE SHIELD | Admitting: Nurse Practitioner

## 2015-02-14 ENCOUNTER — Encounter: Payer: Self-pay | Admitting: Nurse Practitioner

## 2015-02-14 VITALS — BP 132/90 | Ht 62.0 in | Wt 203.2 lb

## 2015-02-14 DIAGNOSIS — M7662 Achilles tendinitis, left leg: Secondary | ICD-10-CM

## 2015-02-14 DIAGNOSIS — I10 Essential (primary) hypertension: Secondary | ICD-10-CM

## 2015-02-14 DIAGNOSIS — M7752 Other enthesopathy of left foot: Secondary | ICD-10-CM

## 2015-02-14 DIAGNOSIS — K219 Gastro-esophageal reflux disease without esophagitis: Secondary | ICD-10-CM | POA: Diagnosis not present

## 2015-02-14 MED ORDER — INDAPAMIDE 2.5 MG PO TABS: ORAL_TABLET | ORAL | Status: DC

## 2015-02-14 MED ORDER — NABUMETONE 500 MG PO TABS: 500.0000 mg | ORAL_TABLET | Freq: Two times a day (BID) | ORAL | Status: DC | PRN

## 2015-02-14 MED ORDER — LOSARTAN POTASSIUM 50 MG PO TABS: 50.0000 mg | ORAL_TABLET | Freq: Every day | ORAL | Status: DC

## 2015-02-14 MED ORDER — POTASSIUM CHLORIDE CRYS ER 10 MEQ PO TBCR: EXTENDED_RELEASE_TABLET | ORAL | Status: DC

## 2015-02-14 MED ORDER — RANITIDINE HCL 300 MG PO TABS: 300.0000 mg | ORAL_TABLET | Freq: Every day | ORAL | Status: DC

## 2015-02-14 NOTE — Patient Instructions (Signed)

## 2015-02-15 ENCOUNTER — Encounter: Payer: Self-pay | Admitting: Nurse Practitioner

## 2015-02-15 NOTE — Progress Notes (Signed)
Subjective:  Presents for complaints of left ankle pain that is been going on for about a year worse over the past week. No specific history of injury. Pain is in the back of the left heel. Between her job and her husband's illness has not been able to be seen for this. Needs refill on Zantac, this has worked well for her reflux in the past. No chest pain/ischemic type pain or shortness of breath. Minimal edema.  Objective:   BP 132/90 mmHg  Ht 5\' 2"  (1.575 m)  Wt 203 lb 3.2 oz (92.171 kg)  BMI 37.16 kg/m2 NAD. Alert, oriented. Lungs clear. Heart RRR. Abdomen soft, mild epigastric area tenderness. Lower extremities no edema. Tenderness to palpation along left achilles tendon into calcaneal bursa. Tenderness with ROM of ankle; no joint laxity.  Assessment:  Problem List Items Addressed This Visit      Cardiovascular and Mediastinum   Hypertension - Primary   Relevant Medications   losartan (COZAAR) 50 MG tablet   indapamide (LOZOL) 2.5 MG tablet     Digestive   GERD (gastroesophageal reflux disease)   Relevant Medications   ranitidine (ZANTAC) 300 MG tablet    Other Visit Diagnoses    Achilles tendinitis of left lower extremity        Calcaneal bursitis (heel), left          Plan:  Meds ordered this encounter  Medications  . ranitidine (ZANTAC) 300 MG tablet    Sig: Take 1 tablet (300 mg total) by mouth at bedtime.    Dispense:  90 tablet    Refill:  1    Order Specific Question:  Supervising Provider    Answer:  Merlyn AlbertLUKING, WILLIAM S [[id number]]  . losartan (COZAAR) 50 MG tablet    Sig: Take 1 tablet (50 mg total) by mouth daily. for blood pressure    Dispense:  30 tablet    Refill:  5    Order Specific Question:  Supervising Provider    Answer:  Merlyn AlbertLUKING, WILLIAM S [[id number]]  . potassium chloride (K-DUR,KLOR-CON) 10 MEQ tablet    Sig: TAKE 1 TABLET BY MOUTH EVERY DAY FOR POTASSIUM    Dispense:  90 tablet    Refill:  1    Order Specific Question:  Supervising Provider    Answer:   Merlyn AlbertLUKING, WILLIAM S [[id number]]  . indapamide (LOZOL) 2.5 MG tablet    Sig: TAKE 1/2 TO 1 TABLET BY MOUTH EVERY DAY FOR SWELLING    Dispense:  30 tablet    Refill:  5    *NEEDS OFFICE VISIT*    Order Specific Question:  Supervising Provider    Answer:  Merlyn AlbertLUKING, WILLIAM S [[id number]]  . nabumetone (RELAFEN) 500 MG tablet    Sig: Take 1 tablet (500 mg total) by mouth 2 (two) times daily as needed.    Dispense:  30 tablet    Refill:  0    Order Specific Question:  Supervising Provider    Answer:  Merlyn AlbertLUKING, WILLIAM S [[id number]]   Restart Zantac. Defers podiatry referral. Given Rx for velcro ankle brace. Call back in 2-3 weeks if symptoms persist. Return in about 6 months (around 08/17/2015).

## 2015-02-18 ENCOUNTER — Encounter: Payer: Self-pay | Admitting: Nurse Practitioner

## 2015-02-19 ENCOUNTER — Telehealth: Payer: Self-pay | Admitting: Family Medicine

## 2015-02-19 MED ORDER — DICLOFENAC SODIUM 75 MG PO TBEC: 75.0000 mg | DELAYED_RELEASE_TABLET | Freq: Two times a day (BID) | ORAL | Status: DC

## 2015-02-19 MED ORDER — OMEPRAZOLE 20 MG PO CPDR: 20.0000 mg | DELAYED_RELEASE_CAPSULE | Freq: Every day | ORAL | Status: DC

## 2015-02-19 NOTE — Telephone Encounter (Signed)
Left message on voicemail notifying patient that medications have been sent to pharmacy.

## 2015-02-19 NOTE — Telephone Encounter (Signed)
Was given Nabumetone 500 mg 1 tablet po BID prn.

## 2015-02-19 NOTE — Telephone Encounter (Signed)
voltaren 75 bid 28 plus add omeprazole 20 daily for thirty d to current zantac

## 2015-02-19 NOTE — Telephone Encounter (Signed)
Patient was seen 5/19 by Eber Jonesarolyn and given something for foot pain and the medication is burning her stomach.[facility] [place]

## 2015-02-27 ENCOUNTER — Ambulatory Visit (INDEPENDENT_AMBULATORY_CARE_PROVIDER_SITE_OTHER): Payer: BLUE CROSS/BLUE SHIELD | Admitting: Family Medicine

## 2015-02-27 VITALS — BP 144/86 | Temp 98.1°F | Ht 62.0 in | Wt 206.0 lb

## 2015-02-27 DIAGNOSIS — L0291 Cutaneous abscess, unspecified: Secondary | ICD-10-CM | POA: Diagnosis not present

## 2015-02-27 DIAGNOSIS — L739 Follicular disorder, unspecified: Secondary | ICD-10-CM | POA: Diagnosis not present

## 2015-02-27 NOTE — Progress Notes (Signed)
   Subjective:    Patient ID: Jessica Kirby, female    DOB: Feb 02, 1956, [age] y.o.   MRN: [id number]  HPI Boil on right inner thigh. Taking doxy. I spoke with the patient on Memorial Day she was having this problem been going on for approximately 1-2 days we called in doxycycline.  Scratched spot on left breast. Pt states area looks infected.  There is no fluctuance or drainage from this no underlying mass or tenderness  Review of Systems     Objective:   Physical Exam She has an excoriated area on the left breast that was related to where she scratched off a pimple localized infection nothing severe approximate 2 mm x 4 mm  Upper thigh on the inner portion there is 0.5 cm x 4 mm nodule that appears to be a small abscess that is not fluctuant at this point.       Assessment & Plan:  This patient was taught to 10 told that if this area does not improve by the time she finishes her doxycycline she will need an additional round called and she will notify us she was also told that if she has ongoing trouble despite a segment d[place]ide when she would need to follow-up in 2-3 weeks warning signs were discussed in detail. 15 minutes spent with patient.

## 2015-03-06 ENCOUNTER — Telehealth: Payer: Self-pay | Admitting: Family Medicine

## 2015-03-06 ENCOUNTER — Other Ambulatory Visit: Payer: Self-pay | Admitting: *Deleted

## 2015-03-06 MED ORDER — DOXYCYCLINE HYCLATE 100 MG PO CAPS: 100.0000 mg | ORAL_CAPSULE | Freq: Two times a day (BID) | ORAL | Status: DC

## 2015-03-06 NOTE — Telephone Encounter (Signed)
Pt called requesting a refill on her antibiotic.   [facility]

## 2015-03-06 NOTE — Telephone Encounter (Signed)
[facility] last filled 5/30 doxy 100mg  one bid for 10 days.

## 2015-03-06 NOTE — Telephone Encounter (Signed)
Ok ref times one 

## 2015-03-06 NOTE — Telephone Encounter (Signed)
Please read assessment and plan from 02/27/15 office visit (per Dr. Lorin PicketScott).

## 2015-03-06 NOTE — Telephone Encounter (Signed)
Med sent to pharm. Pt notified. On voicemail.  

## 2015-03-07 ENCOUNTER — Ambulatory Visit (HOSPITAL_COMMUNITY)
Admission: RE | Admit: 2015-03-07 | Discharge: 2015-03-07 | Disposition: A | Discharge status: Home / Self Care | Payer: BLUE CROSS/BLUE SHIELD | Source: Ambulatory Visit | Attending: Nurse Practitioner | Admitting: Nurse Practitioner

## 2015-03-07 ENCOUNTER — Telehealth: Payer: Self-pay | Admitting: Nurse Practitioner

## 2015-03-07 DIAGNOSIS — M79672 Pain in left foot: Secondary | ICD-10-CM | POA: Diagnosis not present

## 2015-03-07 DIAGNOSIS — M25572 Pain in left ankle and joints of left foot: Secondary | ICD-10-CM

## 2015-03-07 NOTE — Telephone Encounter (Signed)
X-Ray orders placed in [facility]. Patient notified.

## 2015-03-07 NOTE — Telephone Encounter (Signed)
Yes. Xray of left ankle/heel.

## 2015-03-07 NOTE — Telephone Encounter (Signed)
Pt seen for her heel pain 6/1 She had tried the meds but not feeling any relief She wants to know if you think she needs an xray?  Please advise

## 2015-03-11 ENCOUNTER — Other Ambulatory Visit: Payer: Self-pay

## 2015-03-11 DIAGNOSIS — T148XXA Other injury of unspecified body region, initial encounter: Secondary | ICD-10-CM

## 2015-03-21 ENCOUNTER — Encounter: Payer: Self-pay | Admitting: Orthopedic Surgery

## 2015-03-21 ENCOUNTER — Ambulatory Visit (INDEPENDENT_AMBULATORY_CARE_PROVIDER_SITE_OTHER): Payer: BLUE CROSS/BLUE SHIELD | Admitting: Orthopedic Surgery

## 2015-03-21 VITALS — BP 136/84 | Ht 62.0 in | Wt 206.0 lb

## 2015-03-21 DIAGNOSIS — M7662 Achilles tendinitis, left leg: Secondary | ICD-10-CM | POA: Diagnosis not present

## 2015-03-21 NOTE — Patient Instructions (Signed)
Start PT 2 x a week for 6 weeks

## 2015-03-21 NOTE — Progress Notes (Signed)
Patient ID: Jessica Kirby, female   DOB: 10-08-1955, 59 y.o.   MRN: [id number] Patient ID: Jessica Kirby, female   DOB: 1955/10/10, 59 y.o.   MRN: [id number] New    Chief Complaint  Patient presents with  . Ankle Pain    Left ankle pain, referred by Dr. Lubertha South.      Jessica Kirby is a 59 y.o. female.   HPI This 59 year old female presents with pain in her left Achilles tendon. Her symptoms started on November 15 when she begins. Although back got better later she developed pain in the same area with swelling painful dorsiflexion of the foot with radiation of pain into the calf which is become constant and is worse after activity and is unrelieved by naproxen, nabumetone and diclofenac. Walking makes the symptoms worse and the pain is 6 out of 10  Review of systems seasonal allergies joint pain stiffness joints swollen joints no back pain no numbness or tingling. She does have some mild depression and some ankle leg edema. Review of Systems See hpi  Past Medical History  Diagnosis Date  . Herniated disc   . Thyroid disease     Hypothyroidism  . Anxiety     Insomnia  . Depression     Insomnia  . Allergy   . Hypertension   . Osteoporosis     Past Surgical History  Procedure Laterality Date  . Tubal ligation    . Wrist surgery    . Abdominal hysterectomy      Family History  Problem Relation Age of Onset  . Diabetes Father     Social History History  Substance Use Topics  . Smoking status: Passive Smoke Exposure - Never Smoker  . Smokeless tobacco: Never Used  . Alcohol Use: No    Allergies  Allergen Reactions  . Fosamax [Alendronate Sodium] Other (See Comments)    GI Headache  . Trazodone And Nefazodone   . Vioxx [Rofecoxib]     Per patient  . Sulfa Antibiotics Hives    Current Outpatient Prescriptions  Medication Sig Dispense Refill  . ALPRAZolam (XANAX) 0.5 MG tablet Take 1 tablet (0.5 mg total) by mouth at bedtime as needed for anxiety. 30  tablet 2  . diclofenac (VOLTAREN) 75 MG EC tablet Take 1 tablet (75 mg total) by mouth 2 (two) times daily. 28 tablet 0  . doxycycline (VIBRAMYCIN) 100 MG capsule Take 1 capsule (100 mg total) by mouth 2 (two) times daily. 20 capsule 0  . indapamide (LOZOL) 2.5 MG tablet TAKE 1/2 TO 1 TABLET BY MOUTH EVERY DAY FOR SWELLING 30 tablet 5  . loratadine (CLARITIN) 10 MG tablet Take 1 tablet (10 mg total) by mouth daily as needed. FOR ALLERGIES 90 tablet 1  . losartan (COZAAR) 50 MG tablet Take 1 tablet (50 mg total) by mouth daily. for blood pressure 30 tablet 5  . potassium chloride (K-DUR,KLOR-CON) 10 MEQ tablet TAKE 1 TABLET BY MOUTH EVERY DAY FOR POTASSIUM 90 tablet 1  . ranitidine (ZANTAC) 300 MG tablet Take 1 tablet (300 mg total) by mouth at bedtime. 90 tablet 1   No current facility-administered medications for this visit.       Physical Exam Blood pressure 136/84, height  (1.575 m), weight 206 lb (93.441 kg). Physical Exam The patient is well developed well nourished and well groomed. Orientation to person place and time is normal  Mood is pleasant. Ambulatory status antalgic gait left leg  The Achilles tendon  is swollen there is a pump bump which is tender there is no nodularity in the tendon she has some mild calf tenderness she has painful but normal dorsiflexion of the foot or ankle is stable she has normal plantar flexion strength skin is intact over read over the pump bump sensation is normal pulses are normal  Opposite foot normal  Data Reviewed X-rays were ordered and those x-rays were ordered by her primary care physician. I have read the x-ray and interpreted the x-ray as calcification in the Achilles tendon consistent with Achilles tendinosis, calcification the plantar aspect of the calcaneal tuberosity area but no symptoms  Ankle x-rays show what is an apparent cyst in the medial aspect of the medial malleolus and is not consistent with fracture with no clinical  tenderness in that area  Assessment Encounter Diagnosis  Name Primary?  . Achilles tendinitis of left lower extremity Yes    Plan Recommend physical therapy, Cam Walker for 6 weeks. Continue anti-inflammatory medication. Come back 6 weeks. Physical therapy twice a week for 6 weeks work 6 weeks

## 2015-03-22 ENCOUNTER — Ambulatory Visit (HOSPITAL_COMMUNITY): Payer: BLUE CROSS/BLUE SHIELD | Attending: Orthopedic Surgery

## 2015-03-22 ENCOUNTER — Encounter (HOSPITAL_COMMUNITY): Payer: Self-pay

## 2015-03-22 DIAGNOSIS — R531 Weakness: Secondary | ICD-10-CM

## 2015-03-22 DIAGNOSIS — M25672 Stiffness of left ankle, not elsewhere classified: Secondary | ICD-10-CM | POA: Insufficient documentation

## 2015-03-22 DIAGNOSIS — M25572 Pain in left ankle and joints of left foot: Secondary | ICD-10-CM

## 2015-03-22 DIAGNOSIS — R2681 Unsteadiness on feet: Secondary | ICD-10-CM | POA: Diagnosis not present

## 2015-03-22 DIAGNOSIS — M7662 Achilles tendinitis, left leg: Secondary | ICD-10-CM

## 2015-03-23 NOTE — Patient Instructions (Signed)
Ankle Pumps   Lie with knees supported on bolster or sit. Straighten one knee. Keep knee straight; alternately press out and pull up heel. Emphasize movement of heel. Repeat _15_ times within a pain-free range.  Copyright  VHI. All rights reserved.

## 2015-03-23 NOTE — Therapy (Signed)
[facility] [address] [place], [place], [address] Phone: [phone]   Fax:  [phone]  Physical Therapy Evaluation  Patient Details  Name: Jessica Kirby MRN: [id number] Date of Birth: [birth year] Referring Provider:  Vickki Hearing, MD  Encounter Date: 03/22/2015      PT End of Session - 03/23/15 0640    Visit Number 1   Number of Visits 12   Date for PT Re-Evaluation 05/03/15   Authorization Type [employer]   Authorization Time Period 03/22/15-05/03/15   Authorization - Visit Number 1   Authorization - Number of Visits 12   PT Start Time 1018   PT Stop Time 1109   PT Time Calculation (min) 51 min   Activity Tolerance Patient limited by pain   Behavior During Therapy [facility] for tasks assessed/performed      Past Medical History  Diagnosis Date  . Herniated disc   . Thyroid disease     Hypothyroidism  . Anxiety     Insomnia  . Depression     Insomnia  . Allergy   . Hypertension   . Osteoporosis     Past Surgical History  Procedure Laterality Date  . Tubal ligation    . Wrist surgery    . Abdominal hysterectomy      There were no vitals filed for this visit.  Visit Diagnosis:  Achilles tendinitis of left lower extremity - Plan: PT plan of care cert/re-cert  Pain in joint, ankle and foot, left - Plan: PT plan of care cert/re-cert  Ankle stiff, left - Plan: PT plan of care cert/re-cert  Weakness generalized - Plan: PT plan of care cert/re-cert  Unsteadiness on feet - Plan: PT plan of care cert/re-cert      Subjective Assessment - 03/22/15 1027    Subjective Pt reports she hit a bed rail c with L lower leg back in Nov 2015, which was without compication for some time. Gradually worsening pain over time, with resultant conservative measures and medications. Progressive functional activity tolerance at work and home led to further followup, at which point physicican diagnosed pt c achilles tendinopathy. Pt has had  poor assistance from meds, secondary to poor GI tolerance of Naproxen, and is now on Diclofenac and icing regularly.    Limitations Standing   How long can you stand comfortably? 3 minutes    How long can you walk comfortably? 3 minutes    Diagnostic tests xrays    Patient Stated Goals decrease pain, walk normally again, return work    Currently in Pain? Yes   Pain Score 4    Pain Location Ankle   Pain Orientation Left   Pain Descriptors / Indicators Aching;Burning;Other (Comment)  stinging    Pain Type Chronic pain   Pain Relieving Factors icing            [facility] PT Assessment - 03/23/15 0617    Assessment   Medical Diagnosis Achilles Tendinopathy   Onset Date/Surgical Date 07/29/14   Prior Therapy NSAIDs, Ice   Precautions   Precautions None   Precaution Comments --   Required Braces or Orthoses Other Brace/Splint   Other Brace/Splint Pt given orthopedic boot c toe adn heel rocker c limited instructions from physician.    Restrictions   Weight Bearing Restrictions No   Prior Function   Level of Independence Independent   Vocation Full time employment;On disability  written out of work for 6 weeks   Cognition  Overall Cognitive Status Within Functional Limits for tasks assessed   Observation/Other Assessments   Focus on Therapeutic Outcomes (FOTO)  40   Observation/Other Assessments-Edema    Edema --  mild edema near calcaneal attachment of achilles   AROM   Overall AROM Comments L ankle limitations due to pain   Right Hip Extension 40   Right Hip Flexion 120   Left Hip Extension 25   Left Hip Flexion 130   Right Ankle Dorsiflexion 5   Right Ankle Plantar Flexion 50   Left Ankle Dorsiflexion -5   Left Ankle Plantar Flexion 42   Strength   Right Hip Flexion 5/5   Right Hip Extension 4+/5   Right Hip External Rotation  5   Right Hip Internal Rotation  5   Right Hip ABduction 5/5   Left Hip Flexion 4/5   Left Hip Extension 3+/5   Left Hip External Rotation   5   Left Hip Internal Rotation  5   Left Hip ABduction 5/5   Right Knee Flexion 5/5   Right Knee Extension 5/5   Left Knee Flexion 4/5   Left Knee Extension 5/5   Right Ankle Dorsiflexion 5/5   Left Ankle Dorsiflexion 4+/5   Left Ankle Plantar Flexion --  poor tolerance to testing   Flexibility   Soft Tissue Assessment /Muscle Length yes  L lateral distal soleus spasm/ tenderness                   [facility] Adult PT Treatment/Exercise - 03/23/15 0001    Manual Therapy   Soft tissue mobilization 5 min Cross-frictional massage   Lateral attachement of achilles at calcaneus   Myofascial Release L distal lateral soleus   myofascial release + mild stretch   Ankle Exercises: Seated   Ankle Circles/Pumps AROM;Left;15 reps;Limitations  Pai   Ankle Circles/Pumps Limitations in pain-free range only                PT Education - 03/23/15 9147    Education provided Yes   Education Details Gave details on following points: 1. utilize ortho boot for >household distance amb only, if non-provocative. 2. acquisition of 0.5" heel life for L shoe to impose plantar flexion, c intent to donn as a means of pain management throughout day. 3. Attempt all tolerable ambulation in normal footwear. 4. ice frequently throughout day, up to every 2-3 hours for no more than 15 minutes per bout.    Person(s) Educated Patient   Methods Explanation   Comprehension Verbalized understanding;Returned demonstration          PT Short Term Goals - 03/23/15 8295    PT SHORT TERM GOAL #1   Title Pt will demonstrate ability to stand in place for >10 minutes without increase in pain to improve ability to perform ADL/IADL.    Baseline Less than 5 minutes   Time 3   Period Weeks   Status New   PT SHORT TERM GOAL #2   Title Pt will demonstrate ability to walk for >10 minutes without increase in pain to improve ability to perform ADL/IADL.    Baseline Less than 5 minutes   Time 3   Period Weeks    Status New   PT SHORT TERM GOAL #3   Title Pt will demonstrate HEP indep and good compliance with performance of all activities at least 5 of 7 days per week without exacerbation of pain.    Baseline unfamiliar with HEP.  Time 2   Period Weeks   Status New           PT Long Term Goals - 03/23/15 2130    PT LONG TERM GOAL #1   Title Pt will demonstrate ability to stand in place for >60 minutes without increase in pain to improve ability to perform ADL/IADL.    Baseline Less than 5 minutes    Time 6   Period Weeks   Status New   PT LONG TERM GOAL #2   Title Pt will demonstrate ability to walk for >60 minutes without increase in pain to improve ability to perform ADL/IADL.    Baseline Less than 5   Time 6   Period Weeks   Status New   PT LONG TERM GOAL #3   Title Pt will demonstrate return of PLOF in L ankle ROM as evidenced by equal bilateral goniometric assessment.    Baseline limited ROM.    Time 6   Period Weeks   Status New               Plan - 03/23/15 8657    Clinical Impression Statement Pt presenting today with tenderness to palpation and chronic pain at the junction of the L achilles tendon and the lateral calcaneus, with muscle tightness and pain into the distal half of the left lateral soleus. Addional involvement mild swelling in the medial adn lateral ankle is noted, which like accounts for poor tolerance to weight bearing and intermittent parestheis with mobility activities. Involvement of the plantar fascia tissue as well as the gastrocnemius, and peroniu longus and brevis are all negative. Pt presenting with impairments of pain, swelling, decreased ROM, decreased activity tolerance, and weakness, contributing to inability to perform work, ADL, and IADL at baseline function. Pt will benefit from skilled PT intervention to resolve the above impairments and to restore to PLOF.    Pt will benefit from skilled therapeutic intervention in order to improve on the  following deficits Decreased strength;Increased muscle spasms;Improper body mechanics;Difficulty walking;Impaired flexibility;Decreased activity tolerance;Obesity;Pain;Increased edema   Rehab Potential Good   Clinical Impairments Affecting Rehab Potential Achilles tendinopathy has best prognosis when localized near POA as in this case. Prognosis is less than ideal with obesity, hypothyroidism, and females aged 81-70.    PT Frequency 2x / week   PT Duration 6 weeks   PT Treatment/Interventions Gait training;Stair training;Therapeutic activities;Cryotherapy;Therapeutic exercise;Balance training;Passive range of motion;Patient/family education   PT Next Visit Plan Add in ankle circles, 4-way TB, gentle stretching protcol for home.    PT Home Exercise Plan Ankle pumps, modified activity.    Consulted and Agree with Plan of Care Patient         Problem List Patient Active Problem List   Diagnosis Date Noted  . GERD (gastroesophageal reflux disease) 09/27/2013  . Hypothyroidism 12/24/2012  . 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 Hearing decreased 12/24/2012  . Depression with anxiety 12/24/2012  . Hypertension   . Osteopenia     Alfredo Spong C 03/23/2015, 7:07 AM  7:07 AM  Rosamaria Lints, PT, DPT [place] License # [id number]       [facility] [address] [place], [place], [address] Phone: [phone]   Fax:  [phone]

## 2015-03-25 ENCOUNTER — Ambulatory Visit (HOSPITAL_COMMUNITY): Payer: BLUE CROSS/BLUE SHIELD | Admitting: Physical Therapy

## 2015-03-25 DIAGNOSIS — M25672 Stiffness of left ankle, not elsewhere classified: Secondary | ICD-10-CM

## 2015-03-25 DIAGNOSIS — R2681 Unsteadiness on feet: Secondary | ICD-10-CM

## 2015-03-25 DIAGNOSIS — M25572 Pain in left ankle and joints of left foot: Secondary | ICD-10-CM | POA: Diagnosis not present

## 2015-03-25 DIAGNOSIS — M7662 Achilles tendinitis, left leg: Secondary | ICD-10-CM

## 2015-03-25 DIAGNOSIS — R531 Weakness: Secondary | ICD-10-CM

## 2015-03-25 NOTE — Therapy (Signed)
[facility] [address] [place], [place], [address] Phone: [phone]   Fax:  [phone]  Physical Therapy Treatment  Patient Details  Name: Jessica Kirby MRN: [id number] Date of Birth: 1956/06/25 Referring Provider:  Vickki Hearing, MD  Encounter Date: 03/25/2015      PT End of Session - 03/25/15 1501    Visit Number 2   Number of Visits 12   Date for PT Re-Evaluation 05/03/15   Authorization Type BCBS   Authorization Time Period 03/22/15-05/03/15   Authorization - Visit Number 2   Authorization - Number of Visits 12   PT Start Time 1350   PT Stop Time 1435   PT Time Calculation (min) 45 min   Activity Tolerance Patient limited by pain;Patient tolerated treatment well   Behavior During Therapy [facility] for tasks assessed/performed      Past Medical History  Diagnosis Date  . Herniated disc   . Thyroid disease     Hypothyroidism  . Anxiety     Insomnia  . Depression     Insomnia  . Allergy   . Hypertension   . Osteoporosis     Past Surgical History  Procedure Laterality Date  . Tubal ligation    . Wrist surgery    . Abdominal hysterectomy      There were no vitals filed for this visit.  Visit Diagnosis:  Achilles tendinitis of left lower extremity  Pain in joint, ankle and foot, left  Ankle stiff, left  Weakness generalized  Unsteadiness on feet      Subjective Assessment - 03/25/15 1459    Subjective Pt states she 's been keeping her boot on but feels it is no better yet. Currently 6/10 pain in Rt ankle   Currently in Pain? Yes   Pain Score 4    Pain Location Ankle   Pain Orientation Left;Posterior   Pain Descriptors / Indicators Aching;Burning                         [facility] Adult PT Treatment/Exercise - 03/25/15 0001    Modalities   Modalities Cryotherapy   Cryotherapy   Number Minutes Cryotherapy 5 Minutes   Cryotherapy Location Ankle   Type of Cryotherapy Ice massage   Manual Therapy   Manual therapy comments prone   Soft tissue mobilization 5 min Cross-frictional massage    Myofascial Release L distal lateral soleus    Ankle Exercises: Stretches   Plantar Fascia Stretch 3 reps;30 seconds   Gastroc Stretch 3 reps;30 seconds   Ankle Exercises: Seated   ABC's 1 rep   Ankle Circles/Pumps AROM;Left;15 reps;Limitations   Ankle Circles/Pumps Limitations in pain-free range only                  PT Short Term Goals - 03/23/15 2500    PT SHORT TERM GOAL #1   Title Pt will demonstrate ability to stand in place for >10 minutes without increase in pain to improve ability to perform ADL/IADL.    Baseline Less than 5 minutes   Time 3   Period Weeks   Status New   PT SHORT TERM GOAL #2   Title Pt will demonstrate ability to walk for >10 minutes without increase in pain to improve ability to perform ADL/IADL.    Baseline Less than 5 minutes   Time 3   Period Weeks   Status New   PT SHORT TERM GOAL #3  Title Pt will demonstrate HEP indep and good compliance with performance of all activities at least 5 of 7 days per week without exacerbation of pain.    Baseline unfamiliar with HEP.    Time 2   Period Weeks   Status New           PT Long Term Goals - 03/23/15 4098    PT LONG TERM GOAL #1   Title Pt will demonstrate ability to stand in place for >60 minutes without increase in pain to improve ability to perform ADL/IADL.    Baseline Less than 5 minutes    Time 6   Period Weeks   Status New   PT LONG TERM GOAL #2   Title Pt will demonstrate ability to walk for >60 minutes without increase in pain to improve ability to perform ADL/IADL.    Baseline Less than 5   Time 6   Period Weeks   Status New   PT LONG TERM GOAL #3   Title Pt will demonstrate return of PLOF in L ankle ROM as evidenced by equal bilateral goniometric assessment.    Baseline limited ROM.    Time 6   Period Weeks   Status New               Plan - 03/25/15  1507    Clinical Impression Statement continued focus on LE strengthening and stability of ankle.  Added ankle alphabet and plantar fascia stretch to POC and given written instructions for HEP.  PT instructed to bring regular shoe with her to therapy.  Added ice massage today at end of session to help decrease inflammation and pain in achilles tendon.     Clinical Impairments Affecting Rehab Potential Achilles tendinopathy has best prognosis when localized near POA as in this case. Prognosis is less than ideal with obesity, hypothyroidism, and females aged 22-70.    PT Treatment/Interventions Gait training;Stair training;Therapeutic activities;Therapeutic exercise;Balance training;Passive range of motion;Patient/family education   PT Next Visit Plan Begin 4-way Theraband and progress ankle stability.    Consulted and Agree with Plan of Care Patient        Problem List Patient Active Problem List   Diagnosis Date Noted  . GERD (gastroesophageal reflux disease) 09/27/2013  . Hypothyroidism 12/24/2012  . 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 Hearing decreased 12/24/2012  . Depression with anxiety 12/24/2012  . Hypertension   . Osteopenia     Lurena Nida, PTA/CLT [id number]  03/25/2015, 6:26 PM  [facility] [address] [place], [place], [address] Phone: [phone]   Fax:  [phone]

## 2015-03-25 NOTE — Patient Instructions (Signed)
Ankle Alphabet   Using left ankle and foot only, trace the letters of the alphabet. Perform A to Z. Repeat __2__ times per set. Do __2__ sessions per day.  http://orth.exer.us/17   Copyright  VHI. All rights reserved.  Plantar Fascia Stretch   Standing with only ball of left foot on stair, push heel down until stretch is felt through arch of foot. Hold __30__ seconds. Relax. Repeat _3___ times per set.  Do __2__ sessions per day.  http://orth.exer.us/23   Copyright  VHI. All rights reserved.

## 2015-03-27 ENCOUNTER — Ambulatory Visit (HOSPITAL_COMMUNITY): Payer: BLUE CROSS/BLUE SHIELD | Admitting: Physical Therapy

## 2015-03-27 DIAGNOSIS — R531 Weakness: Secondary | ICD-10-CM

## 2015-03-27 DIAGNOSIS — M25572 Pain in left ankle and joints of left foot: Secondary | ICD-10-CM

## 2015-03-27 DIAGNOSIS — M25672 Stiffness of left ankle, not elsewhere classified: Secondary | ICD-10-CM

## 2015-03-27 DIAGNOSIS — M7662 Achilles tendinitis, left leg: Secondary | ICD-10-CM

## 2015-03-27 NOTE — Patient Instructions (Signed)
Plantar Flexion: Resisted   Anchor behind, tubing around left foot, press down. Repeat _15_ times per set. Do __1__ sets per session. Do _1-2___ sessions per day.  http://orth.exer.us/10   Copyright  VHI. All rights reserved.  Dorsiflexion: Resisted   Facing anchor, tubing around left foot, pull toward face.  Repeat __15__ times per set. Do __1__ sets per session. Do _1-2___ sessions per day.  http://orth.exer.us/8   Copyright  VHI. All rights reserved.  Inversion: Resisted   Cross legs with right leg underneath, foot in tubing loop. Hold tubing around other foot to resist and TURN FOOT IN. Repeat __15__ times per set. Do _1___ sets per session. Do __1-2__ sessions per day.  http://orth.exer.us/12   Copyright  VHI. All rights reserved.  Eversion: Resisted   With right foot in tubing loop, hold tubing around other foot to resist and TURN FOOT OUT. Repeat _15___ times per set. Do _1___ sets per session. Do _1-2___ sessions per day.  http://orth.exer.us/14   Copyright  VHI. All rights reserved.

## 2015-03-27 NOTE — Therapy (Signed)
[facility] [address] , [place], [address] Phone: [phone]   Fax:  [phone]  Physical Therapy Treatment  Patient Details  Name: Jessica Kirby MRN: [id number] Date of Birth: December 08, 1955 Referring Provider:  Vickki HearingHarrison, Stanley E, MD  Encounter Date: 03/27/2015      PT End of Session - 03/27/15 1300    Visit Number 3   Number of Visits 12   Date for PT Re-Evaluation 05/03/15   Authorization Type BCBS   Authorization Time Period 03/22/15-05/03/15   Authorization - Visit Number 3   Authorization - Number of Visits 12   PT Start Time 0801   PT Stop Time 0844   PT Time Calculation (min) 43 min   Activity Tolerance Patient tolerated treatment well   Behavior During Therapy [facility] for tasks assessed/performed      Past Medical History  Diagnosis Date  . Herniated disc   . Thyroid disease     Hypothyroidism  . Anxiety     Insomnia  . Depression     Insomnia  . Allergy   . Hypertension   . Osteoporosis     Past Surgical History  Procedure Laterality Date  . Tubal ligation    . Wrist surgery    . Abdominal hysterectomy      There were no vitals filed for this visit.  Visit Diagnosis:  Achilles tendinitis of left lower extremity  Pain in joint, ankle and foot, left  Ankle stiff, left  Weakness generalized      Subjective Assessment - 03/27/15 0802    Subjective Pt reports that her ankle feels stiff today, and it has been feeling stiff at night as well. She rates pain as a 5/10 in her L ankle.   Currently in Pain? Yes   Pain Score 5    Pain Location Ankle   Pain Orientation Left;Posterior               [facility] Adult PT Treatment/Exercise - 03/27/15 0001    Modalities   Modalities Cryotherapy   Cryotherapy   Number Minutes Cryotherapy 5 Minutes   Cryotherapy Location Ankle   Type of Cryotherapy Ice massage   Manual Therapy   Manual therapy comments prone   Soft tissue mobilization 5 min  Cross-frictional massage    Myofascial Release L distal lateral soleus    Ankle Exercises: Stretches   Plantar Fascia Stretch 3 reps;30 seconds   Gastroc Stretch 3 reps;30 seconds   Ankle Exercises: Seated   ABC's 2 reps   Ankle Circles/Pumps 15 reps  2x15 reps   Ankle Circles/Pumps Limitations in pain-free range only   BAPS Level 2;10 reps   Ankle Exercises: Supine   T-Band 4 way ankle strengthening with red tband x 15 each   Ankle Exercises: Standing   SLS 3 reps x 5 seconds                PT Education - 03/27/15 1259    Education provided Yes   Education Details Progression of HEP   Person(s) Educated Patient   Methods Explanation;Handout   Comprehension Verbalized understanding;Returned demonstration          PT Short Term Goals - 03/23/15 0652    PT SHORT TERM GOAL #1   Title Pt will demonstrate ability to stand in place for >10 minutes without increase in pain to improve ability to perform ADL/IADL.    Baseline Less than 5 minutes   Time 3  Period Weeks   Status New   PT SHORT TERM GOAL #2   Title Pt will demonstrate ability to walk for >10 minutes without increase in pain to improve ability to perform ADL/IADL.    Baseline Less than 5 minutes   Time 3   Period Weeks   Status New   PT SHORT TERM GOAL #3   Title Pt will demonstrate HEP indep and good compliance with performance of all activities at least 5 of 7 days per week without exacerbation of pain.    Baseline unfamiliar with HEP.    Time 2   Period Weeks   Status New           PT Long Term Goals - 03/23/15 4098    PT LONG TERM GOAL #1   Title Pt will demonstrate ability to stand in place for >60 minutes without increase in pain to improve ability to perform ADL/IADL.    Baseline Less than 5 minutes    Time 6   Period Weeks   Status New   PT LONG TERM GOAL #2   Title Pt will demonstrate ability to walk for >60 minutes without increase in pain to improve ability to perform ADL/IADL.     Baseline Less than 5   Time 6   Period Weeks   Status New   PT LONG TERM GOAL #3   Title Pt will demonstrate return of PLOF in L ankle ROM as evidenced by equal bilateral goniometric assessment.    Baseline limited ROM.    Time 6   Period Weeks   Status New               Plan - 03/27/15 1300    Clinical Impression Statement Pt experienced difficulty with completing full rotation on BAPS board during clockwise and counterclockwise movements. She also required verbal and visual cueing to perform plantar fascia stretching properly.   PT Next Visit Plan Review 4-way theraband, continue with BAPS board        Problem List Patient Active Problem List   Diagnosis Date Noted  . GERD (gastroesophageal reflux disease) 09/27/2013  . Hypothyroidism 12/24/2012  . 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 Hearing decreased 12/24/2012  . Depression with anxiety 12/24/2012  . Hypertension   . Osteopenia     Leona Singleton, PT, DPT [phone] 03/27/2015, 1:02 PM  [facility] [address] [place], [place], [address] Phone: [phone]   Fax:  [phone]

## 2015-04-04 ENCOUNTER — Ambulatory Visit (HOSPITAL_COMMUNITY): Payer: BLUE CROSS/BLUE SHIELD | Attending: Orthopedic Surgery

## 2015-04-04 DIAGNOSIS — R531 Weakness: Secondary | ICD-10-CM

## 2015-04-04 DIAGNOSIS — M7662 Achilles tendinitis, left leg: Secondary | ICD-10-CM | POA: Insufficient documentation

## 2015-04-04 DIAGNOSIS — M25672 Stiffness of left ankle, not elsewhere classified: Secondary | ICD-10-CM | POA: Diagnosis present

## 2015-04-04 DIAGNOSIS — R2681 Unsteadiness on feet: Secondary | ICD-10-CM | POA: Diagnosis present

## 2015-04-04 DIAGNOSIS — M25572 Pain in left ankle and joints of left foot: Secondary | ICD-10-CM | POA: Diagnosis present

## 2015-04-04 NOTE — Therapy (Signed)
[facility] [address] [place], [place], [address] Phone: [phone]   Fax:  [phone]  Physical Therapy Treatment  Patient Details  Name: Jessica Kirby MRN: [id number] Date of Birth: 1955-11-08 Referring Provider:  Vickki Hearing, MD  Encounter Date: 04/04/2015      PT End of Session - 04/04/15 0854    Visit Number 4   Number of Visits 12   Date for PT Re-Evaluation 05/03/15   Authorization Type BCBS   Authorization Time Period 03/22/15-05/03/15   Authorization - Visit Number 4   Authorization - Number of Visits 12   PT Start Time [phone]   PT Stop Time 0930   PT Time Calculation (min) 44 min   Activity Tolerance Patient tolerated treatment well   Behavior During Therapy [facility] for tasks assessed/performed      Past Medical History  Diagnosis Date  . Herniated disc   . Thyroid disease     Hypothyroidism  . Anxiety     Insomnia  . Depression     Insomnia  . Allergy   . Hypertension   . Osteoporosis     Past Surgical History  Procedure Laterality Date  . Tubal ligation    . Wrist surgery    . Abdominal hysterectomy      There were no vitals filed for this visit.  Visit Diagnosis:  Achilles tendinitis of left lower extremity  Pain in joint, ankle and foot, left  Ankle stiff, left  Weakness generalized  Unsteadiness on feet      Subjective Assessment - 04/04/15 0850    Subjective Pt stated ankle feels stiff today, current pain scale 4/10.  Pt reports compliance with HEP 2x daily and iceing for pain control.     Currently in Pain? Yes   Pain Score 4    Pain Location Ankle   Pain Orientation Left;Posterior             [facility] Adult PT Treatment/Exercise - 04/04/15 0001    Exercises   Exercises Ankle   Cryotherapy   Number Minutes Cryotherapy 5 Minutes   Cryotherapy Location Ankle   Type of Cryotherapy Ice massage   Manual Therapy   Manual therapy comments prone   Soft tissue mobilization 8  min Cross-frictional massage    Myofascial Release L distal lateral soleus    Ankle Exercises: Standing   SLS Lt 16" max of 3   Other Standing Ankle Exercises 3D ankle excursion 10 x   Ankle Exercises: Stretches   Plantar Fascia Stretch 3 reps;30 seconds   Slant Board Stretch 3 reps;30 seconds   Ankle Exercises: Seated   Ankle Circles/Pumps 15 reps   Ankle Circles/Pumps Limitations in pain-free range only   BAPS Level 2;10 reps   Ankle Exercises: Supine   T-Band 4 way ankle strengthening with green tband x 15 each             PT Short Term Goals - 04/04/15 0855    PT SHORT TERM GOAL #1   Title Pt will demonstrate ability to stand in place for >10 minutes without increase in pain to improve ability to perform ADL/IADL.    Status On-going   PT SHORT TERM GOAL #2   Title Pt will demonstrate ability to walk for >10 minutes without increase in pain to improve ability to perform ADL/IADL.    Status On-going   PT SHORT TERM GOAL #3   Title Pt will demonstrate HEP indep and  good compliance with performance of all activities at least 5 of 7 days per week without exacerbation of pain.    Status On-going           PT Long Term Goals - 04/04/15 0856    PT LONG TERM GOAL #1   Title Pt will demonstrate ability to stand in place for >60 minutes without increase in pain to improve ability to perform ADL/IADL.    PT LONG TERM GOAL #2   Title Pt will demonstrate ability to walk for >60 minutes without increase in pain to improve ability to perform ADL/IADL.    PT LONG TERM GOAL #3   Title Pt will demonstrate return of PLOF in L ankle ROM as evidenced by equal bilateral goniometric assessment.                Plan - 04/04/15 0954    Clinical Impression Statement Added 3D ankle excursion in frontal and sagital planes to improve ankle mobility to reduce stiffness.  Reviewed theraband exercises with min cueing for form, pt given green theraband following reports of breaking red one.   Pt with difficulty with full rotation clockwise and counterclockwise movements on BAPS board, therapist facilitation to reduce compensation with knee movements to increase ankle ROM.  Ended session with manual soft tissue mobilization to distal achilles and ice massage for pain control.  Pt stated pain scale reduced to 2/10.   PT Next Visit Plan Continue with current PT POC to improve ankle mobility and strengthening, continue L2 seated BAPS board.        Problem List Patient Active Problem List   Diagnosis Date Noted  . GERD (gastroesophageal reflux disease) 09/27/2013  . Hypothyroidism 12/24/2012  . 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 Hearing decreased 12/24/2012  . Depression with anxiety 12/24/2012  . Hypertension   . Osteopenia    Becky SaxCasey Kirby, LPTA; CBIS [phone]  Juel BurrowCockerham, Jessica Jo 04/04/2015, 10:10 AM  [facility] [address] [place], [place], [address] Phone: [phone]   Fax:  [phone]

## 2015-04-05 ENCOUNTER — Ambulatory Visit (HOSPITAL_COMMUNITY): Payer: BLUE CROSS/BLUE SHIELD

## 2015-04-05 DIAGNOSIS — R2681 Unsteadiness on feet: Secondary | ICD-10-CM

## 2015-04-05 DIAGNOSIS — M7662 Achilles tendinitis, left leg: Secondary | ICD-10-CM

## 2015-04-05 DIAGNOSIS — R531 Weakness: Secondary | ICD-10-CM

## 2015-04-05 DIAGNOSIS — M25672 Stiffness of left ankle, not elsewhere classified: Secondary | ICD-10-CM

## 2015-04-05 DIAGNOSIS — M25572 Pain in left ankle and joints of left foot: Secondary | ICD-10-CM

## 2015-04-05 NOTE — Therapy (Signed)
[facility] [address] [place], [place], [address] Phone: [phone]   Fax:  [phone]  Physical Therapy Treatment  Patient Details  Name: Jessica Kirby MRN: [id number] Date of Birth: 28-Aug-1956 Referring Provider:  Merlyn AlbertLuking, William S, MD  Encounter Date: 04/05/2015      PT End of Session - 04/05/15 1542    Visit Number 5   Number of Visits 12   Date for PT Re-Evaluation 05/03/15   Authorization Type BCBS   Authorization Time Period 03/22/15-05/03/15   Authorization - Visit Number 5   Authorization - Number of Visits 12   PT Start Time 1516   PT Stop Time 1558   PT Time Calculation (min) 42 min   Activity Tolerance Patient tolerated treatment well   Behavior During Therapy [facility] for tasks assessed/performed      Past Medical History  Diagnosis Date  . Herniated disc   . Thyroid disease     Hypothyroidism  . Anxiety     Insomnia  . Depression     Insomnia  . Allergy   . Hypertension   . Osteoporosis     Past Surgical History  Procedure Laterality Date  . Tubal ligation    . Wrist surgery    . Abdominal hysterectomy      There were no vitals filed for this visit.  Visit Diagnosis:  Achilles tendinitis of left lower extremity  Pain in joint, ankle and foot, left  Ankle stiff, left  Weakness generalized  Unsteadiness on feet      Subjective Assessment - 04/05/15 1524    Subjective Ankle is feeling better maybe a 1-2/10 pain scale, likes the green theraband.  Continues compliance with HEP 2x daily.   Currently in Pain? Yes   Pain Score 2    Pain Location Ankle   Pain Orientation Left;Posterior             [facility] Adult PT Treatment/Exercise - 04/05/15 0001    Exercises   Exercises Ankle   Modalities   Modalities Cryotherapy   Cryotherapy   Number Minutes Cryotherapy 6 Minutes   Cryotherapy Location Ankle   Type of Cryotherapy Ice massage   Manual Therapy   Manual therapy comments prone   Soft  tissue mobilization 8 min Cross-frictional massage    Myofascial Release L distal lateral soleus    Ankle Exercises: Stretches   Plantar Fascia Stretch 3 reps;30 seconds   Slant Board Stretch 3 reps;30 seconds   Ankle Exercises: Standing   SLS LT 11" max of 3   Rocker Board 2 minutes   Rocker Board Limitations R/L and A/P   Other Standing Ankle Exercises 3D ankle excursion 10 x   Ankle Exercises: Seated   Ankle Circles/Pumps 15 reps   Ankle Circles/Pumps Limitations in pain-free range only   BAPS Level 2;10 reps   Ankle Exercises: Supine   T-Band HEP             PT Short Term Goals - 04/04/15 0855    PT SHORT TERM GOAL #1   Title Pt will demonstrate ability to stand in place for >10 minutes without increase in pain to improve ability to perform ADL/IADL.    Status On-going   PT SHORT TERM GOAL #2   Title Pt will demonstrate ability to walk for >10 minutes without increase in pain to improve ability to perform ADL/IADL.    Status On-going   PT SHORT TERM GOAL #3  Title Pt will demonstrate HEP indep and good compliance with performance of all activities at least 5 of 7 days per week without exacerbation of pain.    Status On-going           PT Long Term Goals - 04/04/15 0856    PT LONG TERM GOAL #1   Title Pt will demonstrate ability to stand in place for >60 minutes without increase in pain to improve ability to perform ADL/IADL.    PT LONG TERM GOAL #2   Title Pt will demonstrate ability to walk for >60 minutes without increase in pain to improve ability to perform ADL/IADL.    PT LONG TERM GOAL #3   Title Pt will demonstrate return of PLOF in L ankle ROM as evidenced by equal bilateral goniometric assessment.                Plan - 04/05/15 1559    Clinical Impression Statement Continued ankle stretches and mobility exercises to improve ankle ROM.  Added rockerboard to improve weight distribution with gait with min facilitation required.  Pt improving  techniques and mobilty wtih seated BAPS board this session, less cueing required with ability to complete full rotation both direciions.  Ended with manual to achillies and gastroc/soleus completex to reduce  tighteness and ice massage for pain control.  Pt stated very minimal pain scale at end of session.   PT Next Visit Plan Continue with current PT POC to improve ankle mobility and strengthening, continue L2 seated BAPS board, may be able to increase to L3 next session.          Problem List Patient Active Problem List   Diagnosis Date Noted  . GERD (gastroesophageal reflux disease) 09/27/2013  . Hypothyroidism 12/24/2012  . 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 Hearing decreased 12/24/2012  . Depression with anxiety 12/24/2012  . Hypertension   . Osteopenia    Becky Sax, LPTA; CBIS [phone]  Juel Burrow 04/05/2015, 5:32 PM  [facility] [address] [place], [place], [address] Phone: [phone]   Fax:  [phone]

## 2015-04-08 ENCOUNTER — Ambulatory Visit (HOSPITAL_COMMUNITY): Payer: BLUE CROSS/BLUE SHIELD | Admitting: Physical Therapy

## 2015-04-08 DIAGNOSIS — M7662 Achilles tendinitis, left leg: Secondary | ICD-10-CM | POA: Diagnosis not present

## 2015-04-08 DIAGNOSIS — R531 Weakness: Secondary | ICD-10-CM

## 2015-04-08 DIAGNOSIS — M25572 Pain in left ankle and joints of left foot: Secondary | ICD-10-CM

## 2015-04-08 DIAGNOSIS — M25672 Stiffness of left ankle, not elsewhere classified: Secondary | ICD-10-CM

## 2015-04-08 NOTE — Therapy (Signed)
[facility] [address] [place], [place], [address] Phone: [phone]   Fax:  [phone]  Physical Therapy Treatment  Patient Details  Name: Jessica Kirby MRN: [id number] Date of Birth: 1956-04-16 Referring Provider:  Vickki Hearing, MD  Encounter Date: 04/08/2015      PT End of Session - 04/08/15 1156    Visit Number 6   Number of Visits 12   Date for PT Re-Evaluation 05/03/15   Authorization Type BCBS   Authorization Time Period 03/22/15-05/03/15   Authorization - Visit Number 6   Authorization - Number of Visits 12   PT Start Time 1016   PT Stop Time 1058   PT Time Calculation (min) 42 min   Activity Tolerance Patient tolerated treatment well   Behavior During Therapy [facility] for tasks assessed/performed      Past Medical History  Diagnosis Date  . Herniated disc   . Thyroid disease     Hypothyroidism  . Anxiety     Insomnia  . Depression     Insomnia  . Allergy   . Hypertension   . Osteoporosis     Past Surgical History  Procedure Laterality Date  . Tubal ligation    . Wrist surgery    . Abdominal hysterectomy      There were no vitals filed for this visit.  Visit Diagnosis:  Achilles tendinitis of left lower extremity  Pain in joint, ankle and foot, left  Weakness generalized  Ankle stiff, left      Subjective Assessment - 04/08/15 1016    Subjective Pt denies having any pain today, but she is experiencing some stiffness today.    Currently in Pain? No/denies   Pain Score 0-No pain            [facility] Adult PT Treatment/Exercise - 04/08/15 0001    Cryotherapy   Number Minutes Cryotherapy 7 Minutes   Cryotherapy Location Ankle   Type of Cryotherapy Ice massage   Manual Therapy   Manual therapy comments prone   Soft tissue mobilization 10 minute cross friction masage to achilles tendon, soft tissue mobilization to distal gastroc/soleus    Myofascial Release L distal lateral soleus    Ankle  Exercises: Stretches   Plantar Fascia Stretch 3 reps;30 seconds   Slant Board Stretch 3 reps;30 seconds   Ankle Exercises: Standing   SLS 14" max L x 3 reps   Rocker Board 2 minutes   Rocker Board Limitations R/L and A/P   Heel Raises 10 reps   Ankle Exercises: Seated   ABC's 2 reps  verbal cueing to isolate ankle motion   BAPS Level 2;10 reps                PT Education - 04/08/15 1156    Education provided No          PT Short Term Goals - 04/04/15 0855    PT SHORT TERM GOAL #1   Title Pt will demonstrate ability to stand in place for >10 minutes without increase in pain to improve ability to perform ADL/IADL.    Status On-going   PT SHORT TERM GOAL #2   Title Pt will demonstrate ability to walk for >10 minutes without increase in pain to improve ability to perform ADL/IADL.    Status On-going   PT SHORT TERM GOAL #3   Title Pt will demonstrate HEP indep and good compliance with performance of all activities at least 5 of  7 days per week without exacerbation of pain.    Status On-going           PT Long Term Goals - 04/04/15 0856    PT LONG TERM GOAL #1   Title Pt will demonstrate ability to stand in place for >60 minutes without increase in pain to improve ability to perform ADL/IADL.    PT LONG TERM GOAL #2   Title Pt will demonstrate ability to walk for >60 minutes without increase in pain to improve ability to perform ADL/IADL.    PT LONG TERM GOAL #3   Title Pt will demonstrate return of PLOF in L ankle ROM as evidenced by equal bilateral goniometric assessment.                Plan - 04/08/15 1156    Clinical Impression Statement Pt demonstrated soft tissue restriction in distal gastroc/soleus complex that improved with manual therapy. She was able to tolerate the addition of heel raises without c/o increased pain, and was able to complete level 2 BAPS board with minimal verbal cueing during clockwise/counterclockwise motion.  Pt reported that  her foot and ankle felt better post treatment.    PT Next Visit Plan Progress to level 3 BAPS        Problem List Patient Active Problem List   Diagnosis Date Noted  . GERD (gastroesophageal reflux disease) 09/27/2013  . Hypothyroidism 12/24/2012  . 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 Hearing decreased 12/24/2012  . Depression with anxiety 12/24/2012  . Hypertension   . Osteopenia     Leona SingletonLauren Virginia Curl, PT, DPT [phone] 04/08/2015, 12:00 PM  [facility] [address] [place], [place], [address] Phone: [phone]   Fax:  [phone]

## 2015-04-10 ENCOUNTER — Ambulatory Visit (HOSPITAL_COMMUNITY): Payer: BLUE CROSS/BLUE SHIELD

## 2015-04-10 DIAGNOSIS — M25672 Stiffness of left ankle, not elsewhere classified: Secondary | ICD-10-CM

## 2015-04-10 DIAGNOSIS — R2681 Unsteadiness on feet: Secondary | ICD-10-CM

## 2015-04-10 DIAGNOSIS — R531 Weakness: Secondary | ICD-10-CM

## 2015-04-10 DIAGNOSIS — M7662 Achilles tendinitis, left leg: Secondary | ICD-10-CM

## 2015-04-10 DIAGNOSIS — M25572 Pain in left ankle and joints of left foot: Secondary | ICD-10-CM

## 2015-04-10 NOTE — Therapy (Signed)
[facility] [address] [place], [place], [address] Phone: [phone]   Fax:  [phone]  Physical Therapy Treatment  Patient Details  Name: Jessica Kirby MRN: [id number] Date of Birth: 1956-05-03 Referring Provider:  Vickki Hearing, MD  Encounter Date: 04/10/2015      PT End of Session - 04/10/15 1026    Visit Number 7   Number of Visits 12   Date for PT Re-Evaluation 05/03/15   Authorization Type BCBS   Authorization Time Period 03/22/15-05/03/15   Authorization - Visit Number 7   Authorization - Number of Visits 12   PT Start Time 1016   PT Stop Time 1058   PT Time Calculation (min) 42 min   Activity Tolerance Patient tolerated treatment well   Behavior During Therapy [facility] for tasks assessed/performed      Past Medical History  Diagnosis Date  . Herniated disc   . Thyroid disease     Hypothyroidism  . Anxiety     Insomnia  . Depression     Insomnia  . Allergy   . Hypertension   . Osteoporosis     Past Surgical History  Procedure Laterality Date  . Tubal ligation    . Wrist surgery    . Abdominal hysterectomy      There were no vitals filed for this visit.  Visit Diagnosis:  Achilles tendinitis of left lower extremity  Pain in joint, ankle and foot, left  Weakness generalized  Ankle stiff, left  Unsteadiness on feet      Subjective Assessment - 04/10/15 1019    Subjective Pt stated main problem today is ankle stiffness, continues to keep it moving to assist   Currently in Pain? Yes   Pain Score 3    Pain Location Ankle   Pain Orientation Left;Posterior   Pain Descriptors / Indicators Aching;Burning;Tightness                         [facility] Adult PT Treatment/Exercise - 04/10/15 0001    Exercises   Exercises Ankle   Cryotherapy   Number Minutes Cryotherapy 6 Minutes   Cryotherapy Location Ankle   Type of Cryotherapy Ice massage   Manual Therapy   Manual therapy comments  prone   Soft tissue mobilization 10 minute cross friction masage to achilles tendon, soft tissue mobilization to distal gastroc/soleus    Myofascial Release L distal lateral soleus    Ankle Exercises: Stretches   Plantar Fascia Stretch 3 reps;30 seconds   Slant Board Stretch 3 reps;30 seconds   Ankle Exercises: Standing   SLS 14" max L x 3 reps   Rocker Board 2 minutes   Rocker Board Limitations R/L and A/P   Heel Raises 10 reps   Other Standing Ankle Exercises 3D ankle excursion 10 x   Ankle Exercises: Seated   ABC's 2 reps  prone to improve isolation with ankle movements   Towel Crunch 2 reps   BAPS Sitting;Level 3;10 reps   BAPS Limitations all directions                  PT Short Term Goals - 04/10/15 1027    PT SHORT TERM GOAL #1   Title Pt will demonstrate ability to stand in place for >10 minutes without increase in pain to improve ability to perform ADL/IADL.    Status On-going   PT SHORT TERM GOAL #2   Title Pt will demonstrate  ability to walk for >10 minutes without increase in pain to improve ability to perform ADL/IADL.    Status On-going   PT SHORT TERM GOAL #3   Title Pt will demonstrate HEP indep and good compliance with performance of all activities at least 5 of 7 days per week without exacerbation of pain.    Baseline 04/10/2015: Reports compliance with HEP 2x daily   Status Achieved           PT Long Term Goals - 04/10/15 1027    PT LONG TERM GOAL #1   Title Pt will demonstrate ability to stand in place for >60 minutes without increase in pain to improve ability to perform ADL/IADL.    PT LONG TERM GOAL #2   Title Pt will demonstrate ability to walk for >60 minutes without increase in pain to improve ability to perform ADL/IADL.    PT LONG TERM GOAL #3   Title Pt will demonstrate return of PLOF in L ankle ROM as evidenced by equal bilateral goniometric assessment.                Plan - 04/10/15 1054    Clinical Impression Statement  Progressed to L3 BAPS board to improve ankle coordination and increase ROM.  Ended session with manual to reduce soft tissue restrictions in distal gastroc/soleus complex.  Pt reported pain reduced and decreased stiffness at end of session.   PT Next Visit Plan Continue stretches and ankle mobility/ROM exercises, progress functional strenghtening within pain free exercises.          Problem List Patient Active Problem List   Diagnosis Date Noted  . GERD (gastroesophageal reflux disease) 09/27/2013  . Hypothyroidism 12/24/2012  . 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 Hearing decreased 12/24/2012  . Depression with anxiety 12/24/2012  . Hypertension   . Osteopenia    Becky Saxasey Virgina Deakins, LPTA; CBIS [phone]  Juel BurrowCockerham, Adorian Gwynne Jo 04/10/2015, 11:02 AM  [facility] [address] , [place], [address] Phone: [phone]   Fax:  [phone]

## 2015-04-12 ENCOUNTER — Other Ambulatory Visit: Payer: Self-pay | Admitting: Family Medicine

## 2015-04-12 ENCOUNTER — Telehealth: Payer: Self-pay | Admitting: Family Medicine

## 2015-04-12 MED ORDER — DICLOFENAC SODIUM 75 MG PO TBEC: 75.0000 mg | DELAYED_RELEASE_TABLET | Freq: Two times a day (BID) | ORAL | Status: DC

## 2015-04-12 NOTE — Telephone Encounter (Signed)
May refill times 2 

## 2015-04-12 NOTE — Telephone Encounter (Signed)
[facility] (refill was sent to pharmacy).

## 2015-04-12 NOTE — Telephone Encounter (Signed)
Last seen 02/27/15 (sick)

## 2015-04-12 NOTE — Telephone Encounter (Signed)
Nurse's-I believe that this was just filled if so then DC this particular request

## 2015-04-12 NOTE — Telephone Encounter (Signed)
Pt is needing a refill on Diclofenac.    [facility]

## 2015-04-15 ENCOUNTER — Ambulatory Visit (HOSPITAL_COMMUNITY): Payer: BLUE CROSS/BLUE SHIELD | Admitting: Physical Therapy

## 2015-04-15 DIAGNOSIS — R531 Weakness: Secondary | ICD-10-CM

## 2015-04-15 DIAGNOSIS — M7662 Achilles tendinitis, left leg: Secondary | ICD-10-CM

## 2015-04-15 DIAGNOSIS — R2681 Unsteadiness on feet: Secondary | ICD-10-CM

## 2015-04-15 DIAGNOSIS — M25672 Stiffness of left ankle, not elsewhere classified: Secondary | ICD-10-CM

## 2015-04-15 DIAGNOSIS — M25572 Pain in left ankle and joints of left foot: Secondary | ICD-10-CM

## 2015-04-15 NOTE — Therapy (Signed)
[facility] [address] [place], [place], [address] Phone: [phone]   Fax:  [phone]  Physical Therapy Treatment  Patient Details  Name: Jessica Kirby MRN: [id number] Date of Birth: 1956-05-05 Referring Provider:  Vickki HearingHarrison, Stanley E, MD  Encounter Date: 04/15/2015      PT End of Session - 04/15/15 1116    Visit Number 8   Number of Visits 12   Date for PT Re-Evaluation 05/03/15   Authorization Type BCBS   Authorization Time Period 03/22/15-05/03/15   Authorization - Visit Number 8   Authorization - Number of Visits 12   PT Start Time 1018   PT Stop Time 1100   PT Time Calculation (min) 42 min   Activity Tolerance Patient tolerated treatment well   Behavior [facility] for tasks assessed/performed      Past Medical History  Diagnosis Date  . Herniated disc   . Thyroid disease     Hypothyroidism  . Anxiety     Insomnia  . Depression     Insomnia  . Allergy   . Hypertension   . Osteoporosis     Past Surgical History  Procedure Laterality Date  . Tubal ligation    . Wrist surgery    . Abdominal hysterectomy      There were no vitals filed for this visit.  Visit Diagnosis:  Achilles tendinitis of left lower extremity  Pain in joint, ankle and foot, left  Weakness generalized  Ankle stiff, left  Unsteadiness on feet      Subjective Assessment - 04/15/15 1020    Subjective Patient reports that she is chiefly experiencing ankle stiffness today, had a good weekend    Currently in Pain? No/denies                         [facility] - 04/15/15 0001    Ambulation/Gait   Gait Comments Focus on heel to toe gait pattern and natural gait sequencing    Manual Therapy   Soft tissue mobilization soft tissue mobilization of tibilais posterior and achilles tendon    Ankle Exercises: Stretches   Slant Board Stretch 3 reps;30 seconds   Other Stretch Hamstring stretch  12 inch box 3x30 seconds each leg; piriformis stretch 3x30 seconds each leg seated    Ankle Exercises: Seated   Other Seated Ankle Exercises 3D ankle excursions, ankle circles, ankle alphabet 1x10   Ankle Exercises: Standing   BAPS Standing;Level 2;10 reps   SLS 5 reps each side   Heel Raises 10 reps   Other Standing Ankle Exercises bilateral heel raise with lower with L only x5    Other Standing Ankle Exercises Rockerboard x20 AP and lateral; star reaches 1x5 on floor                 PT Education - 04/15/15 1116    Education provided Yes   Education Details possible benefits of contrast baths to reduce edema    Person(s) Educated Patient   Methods Explanation   Comprehension Verbalized understanding          PT Short Term Goals - 04/10/15 1027    PT SHORT TERM GOAL #1   Title Pt will demonstrate ability to stand in place for >10 minutes without increase in pain to improve ability to perform ADL/IADL.    Status On-going   PT SHORT TERM GOAL #2   Title Pt will demonstrate  ability to walk for >10 minutes without increase in pain to improve ability to perform ADL/IADL.    Status On-going   PT SHORT TERM GOAL #3   Title Pt will demonstrate HEP indep and good compliance with performance of all activities at least 5 of 7 days per week without exacerbation of pain.    Baseline 04/10/2015: Reports compliance with HEP 2x daily   Status Achieved           PT Long Term Goals - 04/10/15 1027    PT LONG TERM GOAL #1   Title Pt will demonstrate ability to stand in place for >60 minutes without increase in pain to improve ability to perform ADL/IADL.    PT LONG TERM GOAL #2   Title Pt will demonstrate ability to walk for >60 minutes without increase in pain to improve ability to perform ADL/IADL.    PT LONG TERM GOAL #3   Title Pt will demonstrate return of PLOF in L ankle ROM as evidenced by equal bilateral goniometric assessment.                Plan - 04/15/15 1117     Clinical Impression Statement Continued with functional stretches and ankle mobility. Introduced more standing exercises today as well as more SLS tasks with patient reports of ankle weakness and muscle strain throughout, however no increased pain today. Recommend gentle approach towards progressing standing activties. Finished session with gait training and manual to ankle with no pain and reduced stiffness at end of session.    Pt will benefit from skilled therapeutic intervention in order to improve on the following deficits Decreased strength;Increased muscle spasms;Improper body mechanics;Difficulty walking;Impaired flexibility;Decreased activity tolerance;Obesity;Pain;Increased edema   Rehab Potential Good   Clinical Impairments Affecting Rehab Potential Achilles tendinopathy has best prognosis when localized near POA as in this case. Prognosis is less than ideal with obesity, hypothyroidism, and females aged 78-70.    PT Frequency 2x / week   PT Duration 6 weeks   PT Treatment/Interventions Gait training;Stair training;Therapeutic activities;Therapeutic exercise;Balance training;Passive range of motion;Patient/family education   PT Next Visit Plan Continue stretches and ankle mobility/ROM exercises, progress functional strenghtening within pain free exercises.     PT Home Exercise Plan Ankle pumps, modified activity.    Consulted and Agree with Plan of Care Patient        Problem List Patient Active Problem List   Diagnosis Date Noted  . GERD (gastroesophageal reflux disease) 09/27/2013  . Hypothyroidism 12/24/2012  . 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 Hearing decreased 12/24/2012  . Depression with anxiety 12/24/2012  . Hypertension   . Osteopenia     Nedra Hai PT, DPT [phone]  [facility] [address] [place], [place], [address] Phone: [phone]   Fax:  [phone]

## 2015-04-17 ENCOUNTER — Ambulatory Visit (HOSPITAL_COMMUNITY): Payer: BLUE CROSS/BLUE SHIELD | Admitting: Physical Therapy

## 2015-04-17 DIAGNOSIS — M25672 Stiffness of left ankle, not elsewhere classified: Secondary | ICD-10-CM

## 2015-04-17 DIAGNOSIS — M7662 Achilles tendinitis, left leg: Secondary | ICD-10-CM

## 2015-04-17 DIAGNOSIS — R531 Weakness: Secondary | ICD-10-CM

## 2015-04-17 DIAGNOSIS — M25572 Pain in left ankle and joints of left foot: Secondary | ICD-10-CM

## 2015-04-17 NOTE — Therapy (Signed)
[facility] [address] [place], [place], [address] Phone: [phone]   Fax:  [phone]  Physical Therapy Treatment  Patient Details  Name: Jessica Kirby MRN: [id number] Date of Birth: 03/22/1956 Referring Provider:  Vickki HearingHarrison, Stanley E, MD  Encounter Date: 04/17/2015      PT End of Session - 04/17/15 1100    Visit Number 9   Number of Visits 12   Date for PT Re-Evaluation 05/03/15   Authorization Type BCBS   Authorization Time Period 03/22/15-05/03/15   Authorization - Visit Number 9   Authorization - Number of Visits 12   PT Start Time 1022   PT Stop Time 1100   PT Time Calculation (min) 38 min   Activity Tolerance Patient tolerated treatment well      Past Medical History  Diagnosis Date  . Herniated disc   . Thyroid disease     Hypothyroidism  . Anxiety     Insomnia  . Depression     Insomnia  . Allergy   . Hypertension   . Osteoporosis     Past Surgical History  Procedure Laterality Date  . Tubal ligation    . Wrist surgery    . Abdominal hysterectomy      There were no vitals filed for this visit.  Visit Diagnosis:  Achilles tendinitis of left lower extremity  Pain in joint, ankle and foot, left  Weakness generalized  Ankle stiff, left      Subjective Assessment - 04/17/15 1023    Subjective Pt states she is a little stiff today    Pain Score 3    Pain Location Heel   Pain Descriptors / Indicators Aching;Burning            [facility] PT Assessment - 04/17/15 0001    Strength   Left Ankle Inversion 5/5   Left Ankle Eversion 5/5                     [facility] Adult PT Treatment/Exercise - 04/17/15 0001    Manual Therapy   Manual Therapy Soft tissue mobilization   Soft tissue mobilization soft tissue mobilization of tibilais posterior and achilles tendon    Myofascial Release L distal lateral soleus    Ankle Exercises: Stretches   Plantar Fascia Stretch 3 reps;30 seconds   Gastroc Stretch 3 reps;30 seconds   Slant Board Stretch 4 reps;30 seconds   Ankle Exercises: Seated   Toe Raise 10 reps   BAPS Standing;Level 3   Ankle Exercises: Standing   SLS 5 reps    Heel Walk (Round Trip) 2 RT                   PT Short Term Goals - 04/10/15 1027    PT SHORT TERM GOAL #1   Title Pt will demonstrate ability to stand in place for >10 minutes without increase in pain to improve ability to perform ADL/IADL.    Status On-going   PT SHORT TERM GOAL #2   Title Pt will demonstrate ability to walk for >10 minutes without increase in pain to improve ability to perform ADL/IADL.    Status On-going   PT SHORT TERM GOAL #3   Title Pt will demonstrate HEP indep and good compliance with performance of all activities at least 5 of 7 days per week without exacerbation of pain.    Baseline 04/10/2015: Reports compliance with HEP 2x daily   Status Achieved  PT Long Term Goals - 04/10/15 1027    PT LONG TERM GOAL #1   Title Pt will demonstrate ability to stand in place for >60 minutes without increase in pain to improve ability to perform ADL/IADL.    PT LONG TERM GOAL #2   Title Pt will demonstrate ability to walk for >60 minutes without increase in pain to improve ability to perform ADL/IADL.    PT LONG TERM GOAL #3   Title Pt will demonstrate return of PLOF in L ankle ROM as evidenced by equal bilateral goniometric assessment.                Plan - 04/17/15 1101    Clinical Impression Statement Pt has noted increased tension and edema at gastroc/soleus mm/tendonous junction.  Pt introduced to heel walking without discomfort.  Pt needed verbal cuing to complete plantar and gastroc stretches correctly.     PT Next Visit Plan Continue stretches and ankle mobility/ROM exercises, progress functional strenghtening within pain free exercises.          Problem List Patient Active Problem List   Diagnosis Date Noted  . GERD (gastroesophageal  reflux disease) 09/27/2013  . Hypothyroidism 12/24/2012  . 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 Hearing decreased 12/24/2012  . Depression with anxiety 12/24/2012  . Hypertension   . Osteopenia     Virgina Organ, PT CLT [phone] 04/17/2015, 11:04 AM  [facility] [address] [place], [place], [address] Phone: [phone]   Fax:  [phone]

## 2015-04-22 ENCOUNTER — Ambulatory Visit (HOSPITAL_COMMUNITY): Payer: BLUE CROSS/BLUE SHIELD | Admitting: Physical Therapy

## 2015-04-22 DIAGNOSIS — M25672 Stiffness of left ankle, not elsewhere classified: Secondary | ICD-10-CM

## 2015-04-22 DIAGNOSIS — M7662 Achilles tendinitis, left leg: Secondary | ICD-10-CM | POA: Diagnosis not present

## 2015-04-22 DIAGNOSIS — R2681 Unsteadiness on feet: Secondary | ICD-10-CM

## 2015-04-22 DIAGNOSIS — M25572 Pain in left ankle and joints of left foot: Secondary | ICD-10-CM

## 2015-04-22 DIAGNOSIS — R531 Weakness: Secondary | ICD-10-CM

## 2015-04-22 NOTE — Therapy (Signed)
[facility] [address] [place], [place], [address] Phone: [phone]   Fax:  [phone]  Physical Therapy Treatment  Patient Details  Name: Jessica Kirby MRN: [id number] Date of Birth: 06/20/1956 Referring Provider:  Vickki Hearing, MD  Encounter Date: 04/22/2015      PT End of Session - 04/22/15 1129    Visit Number 10   Number of Visits 12   Date for PT Re-Evaluation 05/03/15   Authorization Type BCBS   Authorization Time Period 03/22/15-05/03/15   Authorization - Visit Number 10   Authorization - Number of Visits 12   PT Start Time 1019   PT Stop Time 1058   PT Time Calculation (min) 39 min   Activity Tolerance Patient tolerated treatment well   Behavior During Therapy [facility] for tasks assessed/performed      Past Medical History  Diagnosis Date  . Herniated disc   . Thyroid disease     Hypothyroidism  . Anxiety     Insomnia  . Depression     Insomnia  . Allergy   . Hypertension   . Osteoporosis     Past Surgical History  Procedure Laterality Date  . Tubal ligation    . Wrist surgery    . Abdominal hysterectomy      There were no vitals filed for this visit.  Visit Diagnosis:  Achilles tendinitis of left lower extremity  Pain in joint, ankle and foot, left  Weakness generalized  Ankle stiff, left  Unsteadiness on feet      Subjective Assessment - 04/22/15 1023    Subjective Patient continues to report her ankle is primarily just stiff recently, no reail pain usually and she is trying to stay out of the boot as much as she can.    Currently in Pain? No/denies                         [facility] Adult PT Treatment/Exercise - 04/22/15 0001    Manual Therapy   Manual Therapy Soft tissue mobilization   Manual therapy comments prone   Soft tissue mobilization soft tissue mobilization of tibilais posterior and achilles tendon    Ankle Exercises: Stretches   Gastroc Stretch 3 reps;30  seconds   Other Stretch Hamstrings and piriformis stretches 3x30 seconds each leg    Ankle Exercises: Seated   Other Seated Ankle Exercises 3D ankle excursions, ankle circles, ankle alphabet 1x15   Ankle Exercises: Standing   Heel Raises 10 reps;Other (comment)  eccentric lower with L only    Heel Walk (Round Trip) 2 RT    Other Standing Ankle Exercises Rockerboard x10 AP and lateral with L foot ; star reaches 1x10 on floor                 PT Education - 04/22/15 1129    Education provided Yes   Education Details educated that heel lifts wiill likely not resolve heel pain    Person(s) Educated Patient   Methods Explanation   Comprehension Verbalized understanding          PT Short Term Goals - 04/10/15 1027    PT SHORT TERM GOAL #1   Title Pt will demonstrate ability to stand in place for >10 minutes without increase in pain to improve ability to perform ADL/IADL.    Status On-going   PT SHORT TERM GOAL #2   Title Pt will demonstrate ability to walk for >10  minutes without increase in pain to improve ability to perform ADL/IADL.    Status On-going   PT SHORT TERM GOAL #3   Title Pt will demonstrate HEP indep and good compliance with performance of all activities at least 5 of 7 days per week without exacerbation of pain.    Baseline 04/10/2015: Reports compliance with HEP 2x daily   Status Achieved           PT Long Term Goals - 04/10/15 1027    PT LONG TERM GOAL #1   Title Pt will demonstrate ability to stand in place for >60 minutes without increase in pain to improve ability to perform ADL/IADL.    PT LONG TERM GOAL #2   Title Pt will demonstrate ability to walk for >60 minutes without increase in pain to improve ability to perform ADL/IADL.    PT LONG TERM GOAL #3   Title Pt will demonstrate return of PLOF in L ankle ROM as evidenced by equal bilateral goniometric assessment.                Plan - 04/22/15 1130    Clinical Impression Statement  Patient continues to report increased stiffness in ankle however does not experience a lot of pain right now. Continued focus on ankle and LE stretches, single leg stability and control, and continued manual to ankle to assist in reducing stiffness and pain.    Pt will benefit from skilled therapeutic intervention in order to improve on the following deficits Decreased strength;Increased muscle spasms;Improper body mechanics;Difficulty walking;Impaired flexibility;Decreased activity tolerance;Obesity;Pain;Increased edema   Rehab Potential Good   Clinical Impairments Affecting Rehab Potential Achilles tendinopathy has best prognosis when localized near POA as in this case. Prognosis is less than ideal with obesity, hypothyroidism, and females aged 11-70.    PT Frequency 2x / week   PT Duration 6 weeks   PT Next Visit Plan Continue stretches and ankle mobility/ROM exercises, progress functional strenghtening within pain free exercises.     PT Home Exercise Plan Ankle pumps, modified activity.    Consulted and Agree with Plan of Care Patient        Problem List Patient Active Problem List   Diagnosis Date Noted  . GERD (gastroesophageal reflux disease) 09/27/2013  . Hypothyroidism 12/24/2012  . 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 Hearing decreased 12/24/2012  . Depression with anxiety 12/24/2012  . Hypertension   . Osteopenia     Nedra Hai PT, DPT [phone]  [facility] [address] [place], [place], [address] Phone: [phone]   Fax:  [phone]

## 2015-04-24 ENCOUNTER — Ambulatory Visit (HOSPITAL_COMMUNITY): Payer: BLUE CROSS/BLUE SHIELD

## 2015-04-24 DIAGNOSIS — M7662 Achilles tendinitis, left leg: Secondary | ICD-10-CM | POA: Diagnosis not present

## 2015-04-24 DIAGNOSIS — M25672 Stiffness of left ankle, not elsewhere classified: Secondary | ICD-10-CM

## 2015-04-24 DIAGNOSIS — M25572 Pain in left ankle and joints of left foot: Secondary | ICD-10-CM

## 2015-04-24 DIAGNOSIS — R531 Weakness: Secondary | ICD-10-CM

## 2015-04-24 DIAGNOSIS — R2681 Unsteadiness on feet: Secondary | ICD-10-CM

## 2015-04-24 NOTE — Therapy (Signed)
[facility] [address] [place], [place], [address] Phone: [phone]   Fax:  [phone]  Physical Therapy Treatment  Patient Details  Name: Jessica Kirby MRN: [id number] Date of Birth: 20-Jul-1956 Referring Provider:  Vickki Hearing, MD  Encounter Date: 04/24/2015      PT End of Session - 04/24/15 1027    Visit Number 11   Number of Visits 12   Date for PT Re-Evaluation 05/03/15   Authorization Type BCBS   Authorization Time Period 03/22/15-05/03/15   Authorization - Visit Number 11   Authorization - Number of Visits 12   PT Start Time 1020   PT Stop Time 1105   PT Time Calculation (min) 45 min   Activity Tolerance Patient tolerated treatment well   Behavior During Therapy [facility] for tasks assessed/performed      Past Medical History  Diagnosis Date  . Herniated disc   . Thyroid disease     Hypothyroidism  . Anxiety     Insomnia  . Depression     Insomnia  . Allergy   . Hypertension   . Osteoporosis     Past Surgical History  Procedure Laterality Date  . Tubal ligation    . Wrist surgery    . Abdominal hysterectomy      There were no vitals filed for this visit.  Visit Diagnosis:  Achilles tendinitis of left lower extremity  Pain in joint, ankle and foot, left  Weakness generalized  Ankle stiff, left  Unsteadiness on feet      Subjective Assessment - 04/24/15 1022    Subjective Pt stated heel has been feeling good the last couple of days, reports compliance with HEP couples times daily and has been working towards weening out of boot   Currently in Pain? No/denies           [facility] Adult PT Treatment/Exercise - 04/24/15 0001    Exercises   Exercises Ankle   Cryotherapy   Number Minutes Cryotherapy 5 Minutes   Cryotherapy Location Ankle   Type of Cryotherapy Ice massage   Manual Therapy   Manual Therapy Soft tissue mobilization   Manual therapy comments prone   Soft tissue mobilization soft  tissue mobilization of tibilais posterior and achilles tendon    Ankle Exercises: Stretches   Plantar Fascia Stretch 3 reps;30 seconds   Slant Board Stretch 3 reps;30 seconds   Other Stretch Hamstrings and piriformis stretches 3x30 seconds each leg    Ankle Exercises: Standing   BAPS Standing;Level 2;10 reps   BAPS Limitations A/P, R/L, CW, CCW   SLS BLE SLS 32" max of 3   Rocker Board 2 minutes   Rocker Board Limitations R/L and A/P   Heel Raises 10 reps;Other (comment)  eccentric lowering Lt LE only   Heel Walk (Round Trip) 2 RT    Other Standing Ankle Exercises 3D ankle excursion   Other Standing Ankle Exercises Functional squats 10x with therapist facilitation, BLE forward lunges 10x                  PT Short Term Goals - 04/24/15 1030    PT SHORT TERM GOAL #1   Title Pt will demonstrate ability to stand in place for >10 minutes without increase in pain to improve ability to perform ADL/IADL.    Status On-going   PT SHORT TERM GOAL #2   Title Pt will demonstrate ability to walk for >10 minutes without increase in pain  to improve ability to perform ADL/IADL.    Status On-going   PT SHORT TERM GOAL #3   Title Pt will demonstrate HEP indep and good compliance with performance of all activities at least 5 of 7 days per week without exacerbation of pain.    Status Achieved           PT Long Term Goals - 04/24/15 1030    PT LONG TERM GOAL #1   Title Pt will demonstrate ability to stand in place for >60 minutes without increase in pain to improve ability to perform ADL/IADL.    Status On-going   PT LONG TERM GOAL #2   Title Pt will demonstrate ability to walk for >60 minutes without increase in pain to improve ability to perform ADL/IADL.    Status On-going   PT LONG TERM GOAL #3   Title Pt will demonstrate return of PLOF in L ankle ROM as evidenced by equal bilateral goniometric assessment.    Status On-going               Plan - 04/24/15 1048     Clinical Impression Statement Session focus on improving ankle AROM and functional strengthening within pain free range.  Continued single leg stabiltiy and control exercises with cueing to reduce hip compensation to increased ankle ROM.  Added squats and lunges following reports of RTW on 05/03/2015, therapist facilitation for proper weight bearing to reduce stress anterior knee.  Ended with manual techniques to ankle to reduce tightness and any tenderness.  No reports of pain through session.     PT Next Visit Plan Continue stretches and ankle mobility/ROM exercises, progress functional strenghtening within pain free exercises.   Reaassess next session, begin functional strenghtening exercises with squats, lunges and stair for RTW.  Give advance HEP.        Problem List Patient Active Problem List   Diagnosis Date Noted  . GERD (gastroesophageal reflux disease) 09/27/2013  . Hypothyroidism 12/24/2012  . 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 Hearing decreased 12/24/2012  . Depression with anxiety 12/24/2012  . Hypertension   . Osteopenia    Becky Sax, LPTA; CBIS [phone]  Juel Burrow 04/24/2015, 11:23 AM  [facility] [address] [place], [place], [address] Phone: [phone]   Fax:  [phone]

## 2015-05-01 ENCOUNTER — Ambulatory Visit (HOSPITAL_COMMUNITY): Payer: BLUE CROSS/BLUE SHIELD | Attending: Orthopedic Surgery | Admitting: Physical Therapy

## 2015-05-01 DIAGNOSIS — M25572 Pain in left ankle and joints of left foot: Secondary | ICD-10-CM | POA: Diagnosis present

## 2015-05-01 DIAGNOSIS — R531 Weakness: Secondary | ICD-10-CM | POA: Diagnosis present

## 2015-05-01 DIAGNOSIS — M7662 Achilles tendinitis, left leg: Secondary | ICD-10-CM | POA: Diagnosis not present

## 2015-05-01 DIAGNOSIS — M25672 Stiffness of left ankle, not elsewhere classified: Secondary | ICD-10-CM

## 2015-05-01 DIAGNOSIS — R2681 Unsteadiness on feet: Secondary | ICD-10-CM | POA: Diagnosis present

## 2015-05-01 NOTE — Patient Instructions (Signed)
Ankle Alphabet   Using left ankle and foot only, trace the letters of the alphabet. Perform A to Z. Repeat _2___ times per set. Do _1___ sets per session. Do __2__ sessions per day.  http://orth.exer.us/17   Copyright  VHI. All rights reserved.   Ankle Circles   Slowly rotate right foot and ankle clockwise then counterclockwise. Gradually increase range of motion. Avoid pain. Circle __15__ times each direction per set. Do __2__ sets per session. Do _2___ sessions per day.  http://orth.exer.us/31   Copyright  VHI. All rights reserved.     Heel Raises  Rise up onto toes, then return. Do 15-20 repetitions with both feet, then 5 reps on each leg by itself. As it gets easier to do this exercise on one leg without pain, you can increase the reps.     HEEL WALK  Raise up your toes and walk on your heels.   Walk 10 feet backwards and then 10 feet forwards, 3 times twice a day.

## 2015-05-01 NOTE — Therapy (Signed)
[facility] [place], [place], [address] Phone: [phone]   Fax:  [phone]  Physical Therapy Treatment (Discharge Assessment)  Patient Details  Name: Jessica Kirby MRN: [id number] Date of Birth: 1956/07/05 Referring Provider:  Carole Civil, MD  Encounter Date: 05/01/2015      PT End of Session - 05/01/15 1736    Visit Number 12   Number of Visits 12   Authorization Type BCBS   Authorization Time Period 03/22/15-05/03/15   Authorization - Visit Number 12   Authorization - Number of Visits 12   PT Start Time 7824   PT Stop Time 1227   PT Time Calculation (min) 40 min   Activity Tolerance Patient tolerated treatment well   Behavior During Therapy [facility] for tasks assessed/performed      Past Medical History  Diagnosis Date  . Herniated disc   . Thyroid disease     Hypothyroidism  . Anxiety     Insomnia  . Depression     Insomnia  . Allergy   . Hypertension   . Osteoporosis     Past Surgical History  Procedure Laterality Date  . Tubal ligation    . Wrist surgery    . Abdominal hysterectomy      There were no vitals filed for this visit.  Visit Diagnosis:  Achilles tendinitis of left lower extremity  Pain in joint, ankle and foot, left  Weakness generalized  Ankle stiff, left  Unsteadiness on feet      Subjective Assessment - 05/01/15 1149    Subjective Patient reports that she is doing well, no pain this morning and has still been soaking and icing her ankle.    How long can you stand comfortably? 8/3- no limits   How long can you walk comfortably? 8/3- no limits    Patient Stated Goals decrease pain, walk normally again, return work    Currently in Pain? No/denies            [facility] PT Assessment - 05/01/15 0001    Observation/Other Assessments   Focus on Therapeutic Outcomes (FOTO)  23% limited    AROM   Right Ankle Dorsiflexion 14   Right Ankle Plantar Flexion 52   Left Ankle  Dorsiflexion 17   Left Ankle Plantar Flexion 45   Strength   Right Hip Flexion 4+/5   Right Hip Extension 4+/5   Right Hip ABduction 4+/5   Left Hip Flexion 4+/5   Left Hip Extension 4+/5   Left Hip ABduction 5/5   Right Knee Flexion 5/5   Right Knee Extension 5/5   Left Knee Flexion 4+/5   Left Knee Extension 4+/5   Right Ankle Dorsiflexion 5/5   Right Ankle Plantar Flexion 4-/5   Right Ankle Inversion 5/5   Right Ankle Eversion 5/5   Left Ankle Dorsiflexion 5/5   Left Ankle Plantar Flexion 3-/5   Left Ankle Inversion 5/5   Left Ankle Eversion 5/5                     [facility] Adult PT Treatment/Exercise - 05/01/15 0001    Ankle Exercises: Stretches   Slant Board Stretch 3 reps;30 seconds   Other Stretch Hamstrings and piriformis stretches 3x30 seconds each leg                 PT Education - 05/01/15 1735    Education provided Yes   Education Details DC today, discharge  HEP    Person(s) Educated Patient   Methods Explanation;Handout   Comprehension Verbalized understanding          PT Short Term Goals - 05/01/15 1214    PT SHORT TERM GOAL #1   Title Pt will demonstrate ability to stand in place for >10 minutes without increase in pain to improve ability to perform ADL/IADL.    Time 3   Period Weeks   Status Achieved   PT SHORT TERM GOAL #2   Title Pt will demonstrate ability to walk for >10 minutes without increase in pain to improve ability to perform ADL/IADL.    Time 3   Period Weeks   Status Achieved   PT SHORT TERM GOAL #3   Title Pt will demonstrate HEP indep and good compliance with performance of all activities at least 5 of 7 days per week without exacerbation of pain.    Time 2   Period Weeks   Status Achieved           PT Long Term Goals - 05/01/15 1215    PT LONG TERM GOAL #1   Title Pt will demonstrate ability to stand in place for >60 minutes without increase in pain to improve ability to perform ADL/IADL.    Time 6    Period Weeks   Status Achieved   PT LONG TERM GOAL #2   Title Pt will demonstrate ability to walk for >60 minutes without increase in pain to improve ability to perform ADL/IADL.    Time 6   Period Weeks   Status Achieved   PT LONG TERM GOAL #3   Title Pt will demonstrate return of PLOF in L ankle ROM as evidenced by equal bilateral goniometric assessment.    Time 6   Period Weeks   Status Partially Met               Plan - 05/01/15 1736    Clinical Impression Statement Re-assessment performed today. Patient shows significant improvement and is confident in her ability to manage her condition and perform appropriate HEP at home. At this time the patient does demonstrate some ankle stiffness and plantar flexor weakness but  is otherwise able to perform all functional tasks and activiteis without pain or difficulty. Gave patient a waiver for the [facility] and encouraged regular physical activity. Patient is no longer in need of skilled PT services, DC to home program today.    Pt will benefit from skilled therapeutic intervention in order to improve on the following deficits Decreased strength;Increased muscle spasms;Improper body mechanics;Difficulty walking;Impaired flexibility;Decreased activity tolerance;Obesity;Pain;Increased edema   Rehab Potential Good   Clinical Impairments Affecting Rehab Potential Achilles tendinopathy has best prognosis when localized near POA as in this case. Prognosis is less than ideal with obesity, hypothyroidism, and females aged 48-70.    PT Frequency 2x / week   PT Duration 6 weeks   PT Treatment/Interventions Gait training;Stair training;Therapeutic activities;Therapeutic exercise;Balance training;Passive range of motion;Patient/family education   PT Next Visit Plan DC today    PT Home Exercise Plan Ankle pumps, modified activity.    Consulted and Agree with Plan of Care Patient        Problem List Patient Active Problem List   Diagnosis Date Noted   . GERD (gastroesophageal reflux disease) 09/27/2013  . Hypothyroidism 12/24/2012  . 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 Hearing decreased 12/24/2012  . Depression with anxiety 12/24/2012  . Hypertension   . Osteopenia    PHYSICAL THERAPY DISCHARGE  SUMMARY  Visits from Start of Care: 12  Current functional level related to goals / functional outcomes: Patient is able to perform all functional tasks she needs and wants to do with just some ankle stiffness, pain free    Remaining deficits: Ankle stiffness, plantar flexor weakness    Education / Equipment: HEP for home management, [employer], importance of regular activity  Plan: Patient agrees to discharge.  Patient goals were met. Patient is being discharged due to meeting the stated rehab goals.  ?????       Deniece Ree PT, DPT [place] [address] [place], [place], [address] Phone: [phone]   Fax:  [phone]

## 2015-05-02 ENCOUNTER — Ambulatory Visit (INDEPENDENT_AMBULATORY_CARE_PROVIDER_SITE_OTHER): Payer: BLUE CROSS/BLUE SHIELD | Admitting: Orthopedic Surgery

## 2015-05-02 ENCOUNTER — Encounter: Payer: Self-pay | Admitting: Orthopedic Surgery

## 2015-05-02 VITALS — BP 144/80 | Ht 62.0 in | Wt 206.0 lb

## 2015-05-02 DIAGNOSIS — M7662 Achilles tendinitis, left leg: Secondary | ICD-10-CM | POA: Diagnosis not present

## 2015-05-02 NOTE — Progress Notes (Signed)
Patient ID: Jessica Kirby, female   DOB: 11-12-1955, 59 y.o.   MRN: [id number]  Follow up visit  Chief Complaint  Patient presents with  . Follow-up    6 week follow up left achilles s/p therapy    BP 144/80 mmHg  Ht  (1.575 m)  Wt 206 lb (93.441 kg)  BMI 37.67 kg/m2  Encounter Diagnosis  Name Primary?  . Achilles tendinitis of left lower extremity Yes    The patient has made excellent progress in a [employer] and with physical therapy. She is doing well she is ready to return to work she has regained motion decreased swelling no tenderness  Follow-up as needed all [place] if the symptoms return may return to work full duty no restrictions

## 2015-05-02 NOTE — Patient Instructions (Addendum)
Return to work note with no restrictions 05/03/15

## 2015-05-06 DIAGNOSIS — Z029 Encounter for administrative examinations, unspecified: Secondary | ICD-10-CM

## 2015-05-09 ENCOUNTER — Ambulatory Visit (INDEPENDENT_AMBULATORY_CARE_PROVIDER_SITE_OTHER): Payer: BLUE CROSS/BLUE SHIELD | Admitting: Family Medicine

## 2015-05-09 ENCOUNTER — Encounter: Payer: Self-pay | Admitting: Family Medicine

## 2015-05-09 VITALS — BP 134/84 | Temp 98.0°F | Ht 62.0 in | Wt 215.0 lb

## 2015-05-09 DIAGNOSIS — B9689 Other specified bacterial agents as the cause of diseases classified elsewhere: Secondary | ICD-10-CM

## 2015-05-09 DIAGNOSIS — R062 Wheezing: Secondary | ICD-10-CM | POA: Diagnosis not present

## 2015-05-09 DIAGNOSIS — J019 Acute sinusitis, unspecified: Secondary | ICD-10-CM | POA: Diagnosis not present

## 2015-05-09 MED ORDER — ALBUTEROL SULFATE HFA 108 (90 BASE) MCG/ACT IN AERS: 2.0000 | INHALATION_SPRAY | Freq: Four times a day (QID) | RESPIRATORY_TRACT | Status: DC | PRN

## 2015-05-09 MED ORDER — DICLOFENAC SODIUM 75 MG PO TBEC: 75.0000 mg | DELAYED_RELEASE_TABLET | Freq: Two times a day (BID) | ORAL | Status: DC

## 2015-05-09 MED ORDER — AMOXICILLIN-POT CLAVULANATE 875-125 MG PO TABS: 1.0000 | ORAL_TABLET | Freq: Two times a day (BID) | ORAL | Status: DC

## 2015-05-09 NOTE — Progress Notes (Signed)
   Subjective:    Patient ID: Jessica Kirby, female    DOB: [birth year], 59 y.o.   MRN: [id number]  Cough This is a new problem. The current episode started in the past 7 days. The cough is productive of sputum. Associated symptoms include chest pain, headaches, rhinorrhea and wheezing. Pertinent negatives include no ear pain, fever or shortness of breath. Associated symptoms comments: Back Pain. Treatments tried: Claritin. The treatment provided no relief.   Patient states no other concerns this visit.   Review of Systems  Constitutional: Negative for fever and activity change.  HENT: Positive for congestion and rhinorrhea. Negative for ear pain.   Eyes: Negative for discharge.  Respiratory: Positive for cough and wheezing. Negative for shortness of breath.   Cardiovascular: Positive for chest pain.  Neurological: Positive for headaches.       Objective:   Physical Exam  Constitutional: She appears well-developed.  HENT:  Head: Normocephalic.  Nose: Nose normal.  Mouth/Throat: Oropharynx is clear and moist. No oropharyngeal exudate.  Neck: Neck supple.  Cardiovascular: Normal rate and normal heart sounds.   No murmur heard. Pulmonary/Chest: Effort normal and breath sounds normal. She has no wheezes.  Lymphadenopathy:    She has no cervical adenopathy.  Skin: Skin is warm and dry.  Nursing note and vitals reviewed.   Patient with significant sinus tenderness There are some scattered wheezes     Assessment & Plan:  Viral syndrome with secondary sinusitis antibiotics prescribed warning signs discussed no sign of pneumonia no need for x-ray  Use albuterol for wheezes if progressive pulmonary issues then will need prednisone call back if problems  Patient under significant stress husband has lung cancer

## 2015-05-24 ENCOUNTER — Telehealth: Payer: Self-pay | Admitting: Family Medicine

## 2015-05-24 MED ORDER — CEFPROZIL 500 MG PO TABS: ORAL_TABLET | ORAL | Status: DC

## 2015-05-24 NOTE — Telephone Encounter (Signed)
[facility] 05/24/15 

## 2015-05-24 NOTE — Telephone Encounter (Signed)
Spoke with patient and informed her per Dr.Scott-Probable persistence of sinus infection although cough with sinus infection can often take a few weeks to go away even after the infection is gone. Cefzil 500 mg twice a day 10 days if ongoing troubles I recommend follow-up. Patient verbalized understanding.

## 2015-05-24 NOTE — Telephone Encounter (Signed)
Pt still has a hacking cough. Pt has finished the augmentin she was prescribed but cant seem to get rid of the cough. Pt would like something called in for it.   [facility]

## 2015-05-24 NOTE — Telephone Encounter (Signed)
Probable persistence of sinus infection although cough with sinus infection can often take a few weeks to go away even after the infection is gone. Cefzil 500 mg twice a day 10 days if ongoing troubles I recommend follow-up

## 2015-06-11 ENCOUNTER — Telehealth: Payer: Self-pay | Admitting: Family Medicine

## 2015-06-11 NOTE — Telephone Encounter (Signed)
LMRC

## 2015-06-11 NOTE — Telephone Encounter (Signed)
Please get more information. Did the cough start after taking this med? Any other factors that could contribute to cough? Other symptoms such as fever, chest pain, SOB?

## 2015-06-11 NOTE — Telephone Encounter (Signed)
Patient is unsure if this started after taking the medication. She is having a dry cough, no fever, slight shortness of breath and slight chest pain.

## 2015-06-11 NOTE — Telephone Encounter (Signed)
Message for Jessica Kirby) patient states that blood pressure medication losartan 50 mg is making her cough. So she call the pharmacist and they told her that one of the side affects was coughing. She wanted appointment after 4pm with you and we didn't have one so she schedule one Oct. 6 when she out on vacation. She wants to know if she should continue her medication until she seen.  She states her is so bad it makes her chest hurt.

## 2015-06-13 ENCOUNTER — Other Ambulatory Visit: Payer: Self-pay | Admitting: Nurse Practitioner

## 2015-06-13 MED ORDER — AMLODIPINE BESYLATE 5 MG PO TABS: 5.0000 mg | ORAL_TABLET | Freq: Every day | ORAL | Status: DC

## 2015-06-13 NOTE — Telephone Encounter (Signed)
LMRC

## 2015-06-13 NOTE — Telephone Encounter (Signed)
Although Losartan may cause cough, this sounds like something more is going on. I recommend office visit here or urgent care for evaluation. Stop Losartan. I will send in new bp med. Let me know if any problems.

## 2015-06-13 NOTE — Telephone Encounter (Signed)
Notified patient although Losartan may cause cough, this sounds like something more is going on. Eber Jones recommend office visit here or urgent care for evaluation. Stop Losartan. Eber Jones will send in new bp med. Let me know if any problems. Patient verbalized understanding.

## 2015-06-20 ENCOUNTER — Telehealth: Payer: Self-pay | Admitting: Family Medicine

## 2015-06-20 DIAGNOSIS — R05 Cough: Secondary | ICD-10-CM

## 2015-06-20 DIAGNOSIS — R059 Cough, unspecified: Secondary | ICD-10-CM

## 2015-06-20 NOTE — Telephone Encounter (Signed)
BP med was changed, but still has a cough, with morning wheezing and tightness.  Bothersome when she tries to speak throughout the day Suggestions or NTBS? Appt 07/04/15 @ 11:00 or NTBS before then

## 2015-06-20 NOTE — Telephone Encounter (Signed)
#  1: is she taking Zantac? How is her reflux doing? If she is still having symptoms, I recommend stopping Zantac and switching to a different med.  #2: if reflux is under control, recommend pre and post pulmonary function test and a chest xray if we can fit this around her schedule. We will review this at her visit if not before.

## 2015-06-21 NOTE — Telephone Encounter (Signed)
Called patient and informed her that order for chest xray and pre and post pulmonary function test was ordered and that she can have xray done at anytime and we be contacted with appointment for pulmonary function test. Patient verbalized understanding.

## 2015-06-21 NOTE — Telephone Encounter (Signed)
[facility] 06/21/15 

## 2015-06-21 NOTE — Telephone Encounter (Signed)
Spoke with patient and patient stated that she is taking Zantac and her acid reflux is under control. I informed her per Nathaneil Canary that she recommends Pre post function test and chest Xray. Patient verbalized understanding and chest x-ray and PFT ordered in epic.

## 2015-06-25 ENCOUNTER — Other Ambulatory Visit: Payer: Self-pay | Admitting: Nurse Practitioner

## 2015-06-25 DIAGNOSIS — Z1231 Encounter for screening mammogram for malignant neoplasm of breast: Secondary | ICD-10-CM

## 2015-07-01 ENCOUNTER — Ambulatory Visit (HOSPITAL_COMMUNITY)
Admission: RE | Admit: 2015-07-01 | Discharge: 2015-07-01 | Disposition: A | Discharge status: Home / Self Care | Payer: BLUE CROSS/BLUE SHIELD | Source: Ambulatory Visit | Attending: Nurse Practitioner | Admitting: Nurse Practitioner

## 2015-07-01 ENCOUNTER — Encounter (HOSPITAL_COMMUNITY): Payer: Self-pay

## 2015-07-01 DIAGNOSIS — R062 Wheezing: Secondary | ICD-10-CM | POA: Diagnosis not present

## 2015-07-01 DIAGNOSIS — Z1231 Encounter for screening mammogram for malignant neoplasm of breast: Secondary | ICD-10-CM | POA: Insufficient documentation

## 2015-07-01 DIAGNOSIS — R05 Cough: Secondary | ICD-10-CM | POA: Diagnosis not present

## 2015-07-02 ENCOUNTER — Other Ambulatory Visit: Payer: Self-pay | Admitting: *Deleted

## 2015-07-02 DIAGNOSIS — R9389 Abnormal findings on diagnostic imaging of other specified body structures: Secondary | ICD-10-CM

## 2015-07-03 ENCOUNTER — Ambulatory Visit (HOSPITAL_COMMUNITY)
Admission: RE | Admit: 2015-07-03 | Discharge: 2015-07-03 | Disposition: A | Discharge status: Home / Self Care | Payer: BLUE CROSS/BLUE SHIELD | Source: Ambulatory Visit | Attending: Nurse Practitioner | Admitting: Nurse Practitioner

## 2015-07-03 DIAGNOSIS — R05 Cough: Secondary | ICD-10-CM | POA: Diagnosis present

## 2015-07-03 LAB — PULMONARY FUNCTION TEST
FEF 25-75 PRE: 2.24 L/s
FEF 25-75 Post: 2.43 L/sec
FEF2575-%Change-Post: 8 %
FEF2575-%PRED-POST: 111 %
FEF2575-%PRED-PRE: 102 %
FEV1-%CHANGE-POST: 0 %
FEV1-%PRED-POST: 79 %
FEV1-%Pred-Pre: 79 %
FEV1-POST: 1.86 L
FEV1-PRE: 1.85 L
FEV1FVC-%Change-Post: -2 %
FEV1FVC-%PRED-PRE: 110 %
FEV6-%CHANGE-POST: 3 %
FEV6-%PRED-POST: 76 %
FEV6-%Pred-Pre: 73 %
FEV6-POST: 2.24 L
FEV6-Pre: 2.16 L
FEV6FVC-%PRED-POST: 104 %
FEV6FVC-%PRED-PRE: 104 %
FVC-%CHANGE-POST: 3 %
FVC-%Pred-Post: 73 %
FVC-%Pred-Pre: 71 %
FVC-Post: 2.24 L
FVC-Pre: 2.16 L
PRE FEV6/FVC RATIO: 100 %
Post FEV1/FVC ratio: 83 %
Post FEV6/FVC ratio: 100 %
Pre FEV1/FVC ratio: 86 %

## 2015-07-03 MED ORDER — ALBUTEROL SULFATE (2.5 MG/3ML) 0.083% IN NEBU
2.5000 mg | INHALATION_SOLUTION | Freq: Once | RESPIRATORY_TRACT | Status: AC
  Administered 2015-07-03: 2.5 mg via RESPIRATORY_TRACT

## 2015-07-04 ENCOUNTER — Encounter: Payer: Self-pay | Admitting: Nurse Practitioner

## 2015-07-04 ENCOUNTER — Ambulatory Visit (INDEPENDENT_AMBULATORY_CARE_PROVIDER_SITE_OTHER): Payer: BLUE CROSS/BLUE SHIELD | Admitting: Nurse Practitioner

## 2015-07-04 VITALS — BP 144/90 | Ht 62.0 in | Wt 213.0 lb

## 2015-07-04 DIAGNOSIS — I10 Essential (primary) hypertension: Secondary | ICD-10-CM

## 2015-07-04 DIAGNOSIS — J300 Vasomotor rhinitis: Secondary | ICD-10-CM

## 2015-07-04 DIAGNOSIS — K219 Gastro-esophageal reflux disease without esophagitis: Secondary | ICD-10-CM

## 2015-07-04 DIAGNOSIS — R053 Chronic cough: Secondary | ICD-10-CM | POA: Insufficient documentation

## 2015-07-04 DIAGNOSIS — R05 Cough: Secondary | ICD-10-CM | POA: Diagnosis not present

## 2015-07-04 MED ORDER — RANITIDINE HCL 300 MG PO TABS: 300.0000 mg | ORAL_TABLET | Freq: Every day | ORAL | Status: DC

## 2015-07-04 NOTE — Progress Notes (Signed)
Subjective:  Presents for recheck. Cough is slightly improved. Mainly notices it when she is lying down and first thing in the morning. Slightly productive at times. No fever. Is scheduled for CT scan of her chest tomorrow-see recent x-ray report. Having some reflux symptoms, better with Zantac. Exposed to powder on the floors at work which she states is probably flour. Wears a mask at work. Minimal caffeine intake. No alcohol or tobacco use. Has failed her hearing screen in her right ear. Occasional sinus pressure. Some hoarseness, no sore throat. Has seen slight improvement in the cough since stopping lisinopril.  Objective:   BP 144/90 mmHg  Ht  (1.575 m)  Wt 213 lb (96.616 kg)  BMI 38.95 kg/m2 NAD. Alert, oriented. TMs clear effusion more on the right, no erythema. Pharynx mildly injected with clear PND noted. Neck supple with mild soft anterior adenopathy. Lungs clear. Occasional nonproductive cough noted with deep breath. Heart regular rate rhythm. Abdomen soft nondistended with mild epigastric area discomfort.  Assessment:  Problem List Items Addressed This Visit      Cardiovascular and Mediastinum   Hypertension - Primary     Respiratory   Vasomotor rhinitis     Digestive   GERD (gastroesophageal reflux disease)   Relevant Medications   ranitidine (ZANTAC) 300 MG tablet     Other   Persistent cough     Plan:  Meds ordered this encounter  Medications  . ranitidine (ZANTAC) 300 MG tablet    Sig: Take 1 tablet (300 mg total) by mouth at bedtime.    Dispense:  90 tablet    Refill:  1    Order Specific Question:  Supervising Provider    Answer:  Merlyn Albert [[id number]]   Further follow-up based on CT scan report and final report from pulmonary function tests. Continue amlodipine as directed. Refilled Zantac, encouraged to take this every day. Identified 3 potential sources of her cough including rhinitis, GERD and exposure to air intense at work. Will be getting flu  vaccine and lab work through her employer, to get a copy to our office. Return in about 4 months (around 11/04/2015) for recheck.

## 2015-07-05 ENCOUNTER — Ambulatory Visit (HOSPITAL_COMMUNITY)
Admission: RE | Admit: 2015-07-05 | Discharge: 2015-07-05 | Disposition: A | Discharge status: Home / Self Care | Payer: BLUE CROSS/BLUE SHIELD | Source: Ambulatory Visit | Attending: Nurse Practitioner | Admitting: Nurse Practitioner

## 2015-07-05 DIAGNOSIS — E279 Disorder of adrenal gland, unspecified: Secondary | ICD-10-CM | POA: Diagnosis not present

## 2015-07-05 DIAGNOSIS — R918 Other nonspecific abnormal finding of lung field: Secondary | ICD-10-CM | POA: Diagnosis not present

## 2015-07-05 DIAGNOSIS — R05 Cough: Secondary | ICD-10-CM | POA: Insufficient documentation

## 2015-07-05 LAB — POCT I-STAT CREATININE: CREATININE: 0.8 mg/dL (ref 0.44–1.00)

## 2015-07-05 MED ORDER — IOHEXOL 300 MG/ML  SOLN: 100.0000 mL | Freq: Once | INTRAMUSCULAR | Status: DC | PRN

## 2015-07-05 MED ORDER — IOHEXOL 300 MG/ML  SOLN
75.0000 mL | Freq: Once | INTRAMUSCULAR | Status: AC | PRN
  Administered 2015-07-05: 100 mL via INTRAVENOUS

## 2015-07-10 ENCOUNTER — Telehealth: Payer: Self-pay | Admitting: Family Medicine

## 2015-07-10 NOTE — Telephone Encounter (Signed)
Pt called wanting to know the results to her pulmonary function test.

## 2015-07-10 NOTE — Telephone Encounter (Signed)
Ordered by [name] 

## 2015-07-12 NOTE — Telephone Encounter (Signed)
See report

## 2015-07-19 ENCOUNTER — Emergency Department (HOSPITAL_COMMUNITY): Payer: BLUE CROSS/BLUE SHIELD

## 2015-07-19 ENCOUNTER — Observation Stay (HOSPITAL_COMMUNITY)
Admission: EM | Admit: 2015-07-19 | Discharge: 2015-07-20 | Disposition: A | Discharge status: Home / Self Care | Payer: BLUE CROSS/BLUE SHIELD | Attending: Internal Medicine | Admitting: Internal Medicine

## 2015-07-19 ENCOUNTER — Encounter (HOSPITAL_COMMUNITY): Payer: Self-pay

## 2015-07-19 DIAGNOSIS — R079 Chest pain, unspecified: Secondary | ICD-10-CM | POA: Diagnosis not present

## 2015-07-19 DIAGNOSIS — M810 Age-related osteoporosis without current pathological fracture: Secondary | ICD-10-CM | POA: Insufficient documentation

## 2015-07-19 DIAGNOSIS — Z79899 Other long term (current) drug therapy: Secondary | ICD-10-CM | POA: Diagnosis not present

## 2015-07-19 DIAGNOSIS — E079 Disorder of thyroid, unspecified: Secondary | ICD-10-CM | POA: Insufficient documentation

## 2015-07-19 DIAGNOSIS — I10 Essential (primary) hypertension: Secondary | ICD-10-CM | POA: Diagnosis present

## 2015-07-19 DIAGNOSIS — E876 Hypokalemia: Secondary | ICD-10-CM | POA: Diagnosis present

## 2015-07-19 DIAGNOSIS — F329 Major depressive disorder, single episode, unspecified: Secondary | ICD-10-CM | POA: Insufficient documentation

## 2015-07-19 DIAGNOSIS — K219 Gastro-esophageal reflux disease without esophagitis: Secondary | ICD-10-CM | POA: Insufficient documentation

## 2015-07-19 DIAGNOSIS — F419 Anxiety disorder, unspecified: Secondary | ICD-10-CM | POA: Insufficient documentation

## 2015-07-19 LAB — CBC WITH DIFFERENTIAL/PLATELET
BASOS ABS: 0.1 10*3/uL (ref 0.0–0.1)
BASOS PCT: 1 %
EOS ABS: 0.3 10*3/uL (ref 0.0–0.7)
EOS PCT: 4 %
HCT: 36.8 % (ref 36.0–46.0)
HEMOGLOBIN: 12.2 g/dL (ref 12.0–15.0)
LYMPHS PCT: 38 %
Lymphs Abs: 3.5 10*3/uL (ref 0.7–4.0)
MCH: 28.5 pg (ref 26.0–34.0)
MCHC: 33.2 g/dL (ref 30.0–36.0)
MCV: 86 fL (ref 78.0–100.0)
MONO ABS: 0.6 10*3/uL (ref 0.1–1.0)
Monocytes Relative: 7 %
Neutro Abs: 4.6 10*3/uL (ref 1.7–7.7)
Neutrophils Relative %: 50 %
Platelets: 254 10*3/uL (ref 150–400)
RBC: 4.28 MIL/uL (ref 3.87–5.11)
RDW: 12.3 % (ref 11.5–15.5)
WBC: 9.1 10*3/uL (ref 4.0–10.5)

## 2015-07-19 NOTE — ED Notes (Signed)
Pt reports mid sternal chest pressure that started approx 1 hour ago.  Pt states she had just got up from a nap and felt dizzy and sweaty.

## 2015-07-20 DIAGNOSIS — K219 Gastro-esophageal reflux disease without esophagitis: Secondary | ICD-10-CM | POA: Insufficient documentation

## 2015-07-20 DIAGNOSIS — E876 Hypokalemia: Secondary | ICD-10-CM | POA: Diagnosis not present

## 2015-07-20 DIAGNOSIS — R079 Chest pain, unspecified: Secondary | ICD-10-CM | POA: Diagnosis present

## 2015-07-20 DIAGNOSIS — I10 Essential (primary) hypertension: Secondary | ICD-10-CM

## 2015-07-20 LAB — COMPREHENSIVE METABOLIC PANEL
ALT: 24 U/L (ref 14–54)
AST: 22 U/L (ref 15–41)
Albumin: 3.6 g/dL (ref 3.5–5.0)
Alkaline Phosphatase: 56 U/L (ref 38–126)
Anion gap: 9 (ref 5–15)
BUN: 10 mg/dL (ref 6–20)
CHLORIDE: 108 mmol/L (ref 101–111)
CO2: 25 mmol/L (ref 22–32)
CREATININE: 0.67 mg/dL (ref 0.44–1.00)
Calcium: 8.6 mg/dL — ABNORMAL LOW (ref 8.9–10.3)
GFR calc non Af Amer: >60 mL/min (ref >60)
Glucose, Bld: 143 mg/dL — ABNORMAL HIGH (ref 65–99)
POTASSIUM: 3.2 mmol/L — AB (ref 3.5–5.1)
SODIUM: 142 mmol/L (ref 135–145)
Total Bilirubin: 0.8 mg/dL (ref 0.3–1.2)
Total Protein: 6.4 g/dL — ABNORMAL LOW (ref 6.5–8.1)

## 2015-07-20 LAB — TROPONIN I
Troponin I: <0.03 ng/mL (ref <0.031)
Troponin I: <0.03 ng/mL (ref <0.031)
Troponin I: <0.03 ng/mL (ref <0.031)

## 2015-07-20 LAB — CBC
HCT: 36.1 % (ref 36.0–46.0)
Hemoglobin: 12.1 g/dL (ref 12.0–15.0)
MCH: 28.8 pg (ref 26.0–34.0)
MCHC: 33.5 g/dL (ref 30.0–36.0)
MCV: 86 fL (ref 78.0–100.0)
PLATELETS: 253 10*3/uL (ref 150–400)
RBC: 4.2 MIL/uL (ref 3.87–5.11)
RDW: 12.3 % (ref 11.5–15.5)
WBC: 7.8 10*3/uL (ref 4.0–10.5)

## 2015-07-20 LAB — BASIC METABOLIC PANEL
ANION GAP: 9 (ref 5–15)
BUN: 13 mg/dL (ref 6–20)
CALCIUM: 8.3 mg/dL — AB (ref 8.9–10.3)
CO2: 28 mmol/L (ref 22–32)
Chloride: 104 mmol/L (ref 101–111)
Creatinine, Ser: 0.72 mg/dL (ref 0.44–1.00)
GFR calc Af Amer: >60 mL/min (ref >60)
GLUCOSE: 140 mg/dL — AB (ref 65–99)
Potassium: 2.6 mmol/L — CL (ref 3.5–5.1)
SODIUM: 141 mmol/L (ref 135–145)

## 2015-07-20 LAB — MAGNESIUM: MAGNESIUM: 2 mg/dL (ref 1.7–2.4)

## 2015-07-20 MED ORDER — INDAPAMIDE 2.5 MG PO TABS
2.5000 mg | ORAL_TABLET | Freq: Every day | ORAL | Status: DC
Start: 2015-07-20 — End: 2015-07-20
  Administered 2015-07-20: 2.5 mg via ORAL
  Filled 2015-07-20 (×3): qty 1

## 2015-07-20 MED ORDER — POTASSIUM CHLORIDE CRYS ER 20 MEQ PO TBCR: 40.0000 meq | EXTENDED_RELEASE_TABLET | Freq: Every day | ORAL | Status: DC

## 2015-07-20 MED ORDER — PANTOPRAZOLE SODIUM 40 MG PO TBEC: 40.0000 mg | DELAYED_RELEASE_TABLET | Freq: Every day | ORAL | Status: DC

## 2015-07-20 MED ORDER — POTASSIUM CHLORIDE 10 MEQ/100ML IV SOLN
10.0000 meq | INTRAVENOUS | Status: AC
  Administered 2015-07-20 (×4): 10 meq via INTRAVENOUS
  Filled 2015-07-20 (×4): qty 100

## 2015-07-20 MED ORDER — LORATADINE 10 MG PO TABS
10.0000 mg | ORAL_TABLET | Freq: Every day | ORAL | Status: DC
  Administered 2015-07-20: 10 mg via ORAL
  Filled 2015-07-20 (×2): qty 1

## 2015-07-20 MED ORDER — PANTOPRAZOLE SODIUM 40 MG IV SOLR
40.0000 mg | INTRAVENOUS | Status: DC
  Administered 2015-07-20: 40 mg via INTRAVENOUS
  Filled 2015-07-20: qty 40

## 2015-07-20 MED ORDER — FAMOTIDINE 20 MG PO TABS
20.0000 mg | ORAL_TABLET | Freq: Every day | ORAL | Status: DC
  Filled 2015-07-20: qty 1

## 2015-07-20 MED ORDER — SODIUM CHLORIDE 0.9 % IV SOLN
INTRAVENOUS | Status: DC
  Administered 2015-07-20: 02:00:00 via INTRAVENOUS

## 2015-07-20 MED ORDER — AMLODIPINE BESYLATE 5 MG PO TABS
5.0000 mg | ORAL_TABLET | Freq: Every day | ORAL | Status: DC
  Administered 2015-07-20: 5 mg via ORAL
  Filled 2015-07-20 (×2): qty 1

## 2015-07-20 MED ORDER — ENOXAPARIN SODIUM 40 MG/0.4ML ~~LOC~~ SOLN
40.0000 mg | SUBCUTANEOUS | Status: DC
  Administered 2015-07-20: 40 mg via SUBCUTANEOUS
  Filled 2015-07-20 (×2): qty 0.4

## 2015-07-20 MED ORDER — INDAPAMIDE 2.5 MG PO TABS
2.5000 mg | ORAL_TABLET | Freq: Every day | ORAL | Status: DC
Start: 2015-07-20 — End: 2015-11-04

## 2015-07-20 MED ORDER — ALPRAZOLAM 0.5 MG PO TABS: 0.5000 mg | ORAL_TABLET | Freq: Every evening | ORAL | Status: DC | PRN

## 2015-07-20 MED ORDER — SODIUM CHLORIDE 0.9 % IV SOLN
INTRAVENOUS | Status: AC
  Administered 2015-07-20: 04:00:00 via INTRAVENOUS

## 2015-07-20 MED ORDER — POTASSIUM CHLORIDE CRYS ER 20 MEQ PO TBCR
40.0000 meq | EXTENDED_RELEASE_TABLET | Freq: Every day | ORAL | Status: DC
  Administered 2015-07-20: 40 meq via ORAL
  Filled 2015-07-20: qty 2

## 2015-07-20 MED ORDER — HYDROCODONE-ACETAMINOPHEN 5-325 MG PO TABS: 1.0000 | ORAL_TABLET | ORAL | Status: DC | PRN

## 2015-07-20 MED ORDER — ASPIRIN 81 MG PO CHEW
324.0000 mg | CHEWABLE_TABLET | Freq: Once | ORAL | Status: AC
  Administered 2015-07-20: 324 mg via ORAL
  Filled 2015-07-20: qty 4

## 2015-07-20 NOTE — H&P (Signed)
PCP:   Lubertha SouthSteve Luking, MD   Chief Complaint:  Chest pain  HPI: 59 year old female who   has a past medical history of Herniated disc; Thyroid disease; Anxiety; Depression; Allergy; Hypertension; and Osteoporosis. Today presents to the hospital with chest pain. Patient says that she woke up with some chest tightness and dizziness around 10 PM, she did not have shortness of breath, did not pass out. Denies nausea vomiting or diarrhea. Patient takes indapamide for hypertension which she started taking a month ago. Patient was supposed to fill the prescription for potassium which she did not. She does not have history of CAD. The chest pain resolved. Cardiac enzymes in the ED is negative. Labs revealed hypokalemia with potassium 2.6. Which is being replaced in the ED.  Allergies:   Allergies  Allergen Reactions  . Fosamax [Alendronate Sodium] Other (See Comments)    GI Headache  . Trazodone And Nefazodone   . Vioxx [Rofecoxib]     Per patient  . Losartan Cough  . Sulfa Antibiotics Hives      Past Medical History  Diagnosis Date  . Herniated disc   . Thyroid disease     Hypothyroidism  . Anxiety     Insomnia  . Depression     Insomnia  . Allergy   . Hypertension   . Osteoporosis     Past Surgical History  Procedure Laterality Date  . Tubal ligation    . Wrist surgery    . Abdominal hysterectomy      Prior to Admission medications   Medication Sig Start Date End Date Taking? Authorizing Provider  albuterol (PROVENTIL HFA;VENTOLIN HFA) 108 (90 BASE) MCG/ACT inhaler Inhale 2 puffs into the lungs every 6 (six) hours as needed for wheezing. 05/09/15  Yes Babs SciaraScott A Luking, MD  ALPRAZolam (XANAX) 0.5 MG tablet Take 1 tablet (0.5 mg total) by mouth at bedtime as needed for anxiety. 01/09/15  Yes Campbell Richesarolyn C Hoskins, NP  amLODipine (NORVASC) 5 MG tablet Take 1 tablet (5 mg total) by mouth daily. 06/13/15  Yes Campbell Richesarolyn C Hoskins, NP  diclofenac (VOLTAREN) 75 MG EC tablet Take 1 tablet  (75 mg total) by mouth 2 (two) times daily. 05/09/15  Yes Babs SciaraScott A Luking, MD  indapamide (LOZOL) 2.5 MG tablet TAKE 1/2 TO 1 TABLET BY MOUTH EVERY DAY FOR SWELLING 02/14/15  Yes Campbell Richesarolyn C Hoskins, NP  loratadine (CLARITIN) 10 MG tablet Take 1 tablet (10 mg total) by mouth daily as needed. FOR ALLERGIES 11/10/13  Yes Campbell Richesarolyn C Hoskins, NP  potassium chloride (K-DUR,KLOR-CON) 10 MEQ tablet TAKE 1 TABLET BY MOUTH EVERY DAY FOR POTASSIUM 02/14/15  Yes Campbell Richesarolyn C Hoskins, NP  ranitidine (ZANTAC) 300 MG tablet Take 1 tablet (300 mg total) by mouth at bedtime. 07/04/15  Yes Campbell Richesarolyn C Hoskins, NP  amoxicillin-clavulanate (AUGMENTIN) 875-125 MG per tablet Take 1 tablet by mouth 2 (two) times daily. Patient not taking: Reported on 07/04/2015 05/09/15   Babs SciaraScott A Luking, MD  cefPROZIL (CEFZIL) 500 MG tablet Take 1 tablet twice a day for 10 days. Patient not taking: Reported on 07/04/2015 05/24/15   Babs SciaraScott A Luking, MD  doxycycline (VIBRAMYCIN) 100 MG capsule Take 1 capsule (100 mg total) by mouth 2 (two) times daily. Patient not taking: Reported on 05/02/2015 03/06/15   Merlyn AlbertWilliam S Luking, MD    Social History:  reports that she has been passively smoking.  She has never used smokeless tobacco. She reports that she does not drink alcohol or use illicit drugs.  Family History  Problem Relation Age of Onset  . Diabetes Father     Ceasar Mons Weights   07/19/15 2258  Weight: 97.07 kg (214 lb)    All the positives are listed in BOLD  Review of Systems:  HEENT: Headache, blurred vision, runny nose, sore throat Neck: Hypothyroidism, hyperthyroidism,,lymphadenopathy Chest : Shortness of breath, history of COPD, Asthma Heart : Chest pain, history of coronary arterey disease GI:  Nausea, vomiting, diarrhea, constipation, GERD GU: Dysuria, urgency, frequency of urination, hematuria Neuro: Stroke, seizures, syncope Psych: Depression, anxiety, hallucinations   Physical Exam: Blood pressure 133/77, pulse 74, temperature 97.6  F (36.4 C), temperature source Oral, resp. rate 16, height  (1.575 m), weight 97.07 kg (214 lb), SpO2 93 %. Constitutional:   Patient is a well-developed and well-nourished  Female in no acute distress and cooperative with exam. Head: Normocephalic and atraumatic Mouth: Mucus membranes moist Eyes: PERRL, EOMI, conjunctivae normal Neck: Supple, No Thyromegaly Cardiovascular: RRR, S1 normal, S2 normal Pulmonary/Chest: CTAB, no wheezes, rales, or rhonchi Abdominal: Soft. Non-tender, non-distended, bowel sounds are normal, no masses, organomegaly, or guarding present.  Neurological: A&O x3, Strength is normal and symmetric bilaterally, cranial nerve II-XII are grossly intact, no focal motor deficit, sensory intact to light touch bilaterally.  Extremities : No Cyanosis, Clubbing or Edema  Labs on Admission:  Basic Metabolic Panel:  Recent Labs Lab 07/19/15 2315  NA 141  K 2.6*  CL 104  CO2 28  GLUCOSE 140*  BUN 13  CREATININE 0.72  CALCIUM 8.3*   CBC:  Recent Labs Lab 07/19/15 2315  WBC 9.1  NEUTROABS 4.6  HGB 12.2  HCT 36.8  MCV 86.0  PLT 254   Cardiac Enzymes:  Recent Labs Lab 07/19/15 2315  TROPONINI <0.03    No results for input(s): GLUCAP in the last 168 hours.  Radiological Exams on Admission: Dg Chest 2 View  07/19/2015  CLINICAL DATA:  Acute onset of mid sternal chest pressure. Dizziness and diaphoresis. Initial encounter. EXAM: CHEST  2 VIEW COMPARISON:  Chest radiograph performed 07/01/2015, and CT of the chest performed 07/05/2015 FINDINGS: The lungs are well-aerated and clear. There is no evidence of focal opacification, pleural effusion or pneumothorax. The heart is borderline normal in size. No acute osseous abnormalities are seen. IMPRESSION: No acute cardiopulmonary process seen. Electronically Signed   By: Roanna Raider M.D.   On: 07/19/2015 23:59    EKG: Independently reviewed. Normal sinus rhythm   Assessment/Plan Active Problems:    Hypertension   Chest pain   Hypokalemia  Chest pain Will admit the patient to rule out ACS. Will obtain serial cardiac enzymes. At this time patient is chest pain-free, will continue with Vicodin when necessary for pain.   Hypokalemia Replace potassium and check BMP in a.m.   Hypertension Continue indapamide and amlodipine  DVT prophylaxis   Lovenox   Code status: Full code  Family discussion: No family at bedside   Time Spent on Admission: 55 min  Quaneisha Hanisch S [facility] Pager: [phone] 07/20/2015, 1:09 AM  If 7PM-7AM, please contact night-coverage  [email or web address]  Password [email or web address]

## 2015-07-20 NOTE — Discharge Instructions (Signed)

## 2015-07-20 NOTE — Discharge Summary (Signed)
Physician Discharge Summary  Jessica Kirby WUJ:[id number] DOB: 05-02-1956 DOA: 07/19/2015  PCP: Lubertha SouthSteve Luking, MD  Admit date: 07/19/2015 Discharge date: 07/20/2015  Time spent: 35 minutes  Recommendations for Outpatient Follow-up:  1. Repeat BMET to follow electrolytes and renal function 2. Reassess BP and adjust further as needed  Discharge Diagnoses:  Active Problems:   Hypertension   Chest pain   Hypokalemia anxiety Obesity  GERD Allergic rhinitis   Discharge Condition: stable and improved. No CP at discharge. Patient will be discharge home and has instructions to follow with PCP in 10 days.  Diet recommendation: low calorie diet and heart healthy   Filed Weights   07/19/15 2258  Weight: 97.07 kg (214 lb)    History of present illness:  [age] year old female who  has a past medical history of Herniated disc; Thyroid disease; Anxiety; Depression; Allergy; Hypertension; and Osteoporosis. Today presents to the hospital with chest pain. Patient says that she woke up with some chest tightness and dizziness around 10 PM, she did not have shortness of breath, did not pass out. Denies nausea vomiting or diarrhea. Patient takes indapamide for hypertension which she started taking a month ago. Patient was supposed to fill the prescription for potassium which she did not. She does not have history of CAD.  Hospital Course:  1-CP: non cardiac in origin. Appears to be secondary to GERD -patient without ischemic changes on EKG -neg troponin X 3 -no abnormalities on telemetry  -good response to PPI and has remained pain free  2-GERD:  -will discharge on protonix 40 mg daily and zantac QHS for better control -discussed lifestyle changes with patient and importance of weight loss  3-obesity: -Body mass index is 39.13 kg/(m^2). -low calorie diet and increase exercise discussed with patient  4-HTN: fair control -advise to continue current antihypertensive regimen -advise to follow  low sodium diet   5- hypokalemia: due to diuretic use -normal Mg level -patient potassium repleted and daily supplementation increased to 40meq daily  6-allergic rhinitis: continue use of allegra and nasacort  7-anxiety/depression: stable -will continue home medications regimen   Procedures:  See below for x-ray reports   Consultations:  None   Discharge Exam: Filed Vitals:   07/20/15 0544  BP: 148/77  Pulse: 74  Temp: 97.6 F (36.4 C)  Resp: 20    General: afebrile, no CP, no SOB. Patient stable and asking to be discharge  Cardiovascular: S1 and S2, no rubs, no gallops, no JVD Respiratory: CTA bilaterally Abd: soft, NT, ND, positive BS  Discharge Instructions   Discharge Instructions    Diet - low sodium heart healthy    Complete by:  As directed      Discharge instructions    Complete by:  As directed   Take medications as prescribed Follow a low calorie and heart healthy diet Follow instructions about lifestyle changes for reflux Keep yourself well hydrated Arrange follow up with PCP in 10 days          Current Discharge Medication List    START taking these medications   Details  pantoprazole (PROTONIX) 40 MG tablet Take 1 tablet (40 mg total) by mouth daily. Qty: 40 tablet, Refills: 1      CONTINUE these medications which have CHANGED   Details  indapamide (LOZOL) 2.5 MG tablet Take 1 tablet (2.5 mg total) by mouth daily. TAKE 1/2 TO 1 TABLET BY MOUTH EVERY DAY FOR SWELLING    potassium chloride SA (K-DUR,KLOR-CON) 20 MEQ tablet Take  2 tablets (40 mEq total) by mouth daily. Qty: 60 tablet, Refills: 1      CONTINUE these medications which have NOT CHANGED   Details  albuterol (PROVENTIL HFA;VENTOLIN HFA) 108 (90 BASE) MCG/ACT inhaler Inhale 2 puffs into the lungs every 6 (six) hours as needed for wheezing. Qty: 1 Inhaler, Refills: 2    ALPRAZolam (XANAX) 0.5 MG tablet Take 1 tablet (0.5 mg total) by mouth at bedtime as needed for  anxiety. Qty: 30 tablet, Refills: 2    amLODipine (NORVASC) 5 MG tablet Take 1 tablet (5 mg total) by mouth daily. Qty: 30 tablet, Refills: 2    diclofenac (VOLTAREN) 75 MG EC tablet Take 1 tablet (75 mg total) by mouth 2 (two) times daily. Qty: 28 tablet, Refills: 2    fexofenadine (ALLEGRA) 180 MG tablet Take 180 mg by mouth daily as needed for allergies or rhinitis.    ranitidine (ZANTAC) 300 MG tablet Take 1 tablet (300 mg total) by mouth at bedtime. Qty: 90 tablet, Refills: 1    Triamcinolone Acetonide (NASACORT ALLERGY 24HR NA) Place 1 spray into the nose daily as needed (allergies).      STOP taking these medications     amoxicillin-clavulanate (AUGMENTIN) 875-125 MG per tablet      cefPROZIL (CEFZIL) 500 MG tablet      doxycycline (VIBRAMYCIN) 100 MG capsule      loratadine (CLARITIN) 10 MG tablet        Allergies  Allergen Reactions  . Fosamax [Alendronate Sodium] Other (See Comments)    GI Headache  . Vioxx [Rofecoxib]     Per patient  . Losartan Cough  . Sulfa Antibiotics Hives   Follow-up Information    Follow up with [name], Lubertha Southule an appointment as soon as possible for a visit in 10 days.   Specialty:  Family Medicine   Contact information:   [address] [place] [address] [phone]       The results of significant diagnostics from this hospitalization (including imaging, microbiology, ancillary and laboratory) are listed below for reference.    Significant Diagnostic Studies: Dg Chest 2 View  07/19/2015  CLINICAL DATA:  Acute onset of mid sternal chest pressure. Dizziness and diaphoresis. Initial encounter. EXAM: CHEST  2 VIEW COMPARISON:  Chest radiograph performed 07/01/2015, and CT of the chest performed 07/05/2015 FINDINGS: The lungs are well-aerated and clear. There is no evidence of focal opacification, pleural effusion or pneumothorax. The heart is borderline normal in size. No acute osseous abnormalities are  seen. IMPRESSION: No acute cardiopulmonary process seen. Electronically Signed   By: Roanna Raider M.D.   On: 07/19/2015 23:59   Dg Chest 2 View  07/01/2015  CLINICAL DATA:  Cough, wheezing for a few months EXAM: CHEST  2 VIEW COMPARISON:  09/14/2011 FINDINGS: Heart is normal size. Right lung is clear. Somewhat nodular opacity projecting lateral to the left heart border. Cannot exclude pulmonary nodule. No effusions. No acute bony abnormality. IMPRESSION: Nodular density projecting adjacent to the left heart border in the left lower lung. Cannot exclude pulmonary nodule. Recommend further evaluation with chest CT with IV contrast. Electronically Signed   By: Charlett Nose M.D.   On: 07/01/2015 09:29   Ct Chest W Contrast  07/05/2015  CLINICAL DATA:  Cough over the last 2 months. Abnormal chest x-ray with left lower lobe density. Further evaluation. EXAM: CT CHEST WITH CONTRAST TECHNIQUE: Multidetector CT imaging of the chest was performed during intravenous contrast administration.  CONTRAST:  OMNIPAQUE IOHEXOL 300 MG/ML  SOLN COMPARISON:  07/01/2015.  09/14/2011. FINDINGS: Heart size is normal. Mediastinal structures are normal. No mediastinal mass or lymphadenopathy. No pleural or pericardial fluid. The lungs are entirely clear. No abnormal density is seen on the left where density was questioned at radiography. This must have represented overlapping shadows. Ordinary minimal degenerative changes affect the thoracic spine. Scans in the upper abdomen show a well-circumscribed rounded 2.2 cm mass of the left adrenal gland. Density measurements are approximately 30-35 Hounsfield units. IMPRESSION: Normal appearance of the chest. No left lung lesion. The chest radiographic findings were secondary to overlapping shadows. 2.2 cm well-circumscribed left adrenal mass. Density characteristics of 30-35 Hounsfield units. 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  Because of the low suspicion, I cannot recommend a need for follow-up. If absolute certainty is desired, formal adrenal nodule washout CT could be performed. Electronically Signed   By: Paulina Fusi M.D.   On: 07/05/2015 14:22   Mm Digital Screening Bilateral  07/01/2015  CLINICAL DATA:  Screening. EXAM: DIGITAL SCREENING BILATERAL MAMMOGRAM WITH CAD COMPARISON:  Previous exam(s). ACR Breast Density Category b: There are scattered areas of fibroglandular density. FINDINGS: There are no findings suspicious for malignancy. Images were processed with CAD. IMPRESSION: No mammographic evidence of malignancy. A result letter of this screening mammogram will be mailed directly to the patient. RECOMMENDATION: Screening mammogram in one year. (Code:SM-B-01Y) BI-RADS CATEGORY  1: Negative. Electronically Signed   By: Dalphine Handing M.D.   On: 07/01/2015 18:28   Labs: Basic Metabolic Panel:  Recent Labs Lab 07/19/15 2315 07/20/15 0855  NA 141 142  K 2.6* 3.2*  CL 104 108  CO2 28 25  GLUCOSE 140* 143*  BUN 13 10  CREATININE 0.72 0.67  CALCIUM 8.3* 8.6*  MG  --  2.0   Liver Function Tests:  Recent Labs Lab 07/20/15 0855  AST 22  ALT 24  ALKPHOS 56  BILITOT 0.8  PROT 6.4*  ALBUMIN 3.6   CBC:  Recent Labs Lab 07/19/15 2315 07/20/15 0855  WBC 9.1 7.8  NEUTROABS 4.6  --   HGB 12.2 12.1  HCT 36.8 36.1  MCV 86.0 86.0  PLT 254 253   Cardiac Enzymes:  Recent Labs Lab 07/19/15 2315 07/20/15 0217 07/20/15 0855  TROPONINI <0.03 <0.03 <0.03    Signed:  Vassie Loll  [facility] 07/20/2015, 10:34 AM

## 2015-07-20 NOTE — ED Notes (Signed)
CRITICAL VALUE ALERT  Critical value received:  Potassium 2.6  Date of notification:  10/21  Time of notification:  1217  Critical value read back:Yes.    Nurse who received alert:  Florene Route Hershel Corkery  MD notified :  K. Ward

## 2015-07-20 NOTE — Progress Notes (Signed)
Discharged PT per MD order and protocol. Reviewed discharge teaching and handouts given. Pt verbalized understanding and left with all belongings. VSS. IV catheter D/C. Skyllar Notarianni J Everett, RN  

## 2015-07-20 NOTE — ED Provider Notes (Signed)
TIME SEEN: 12:10 AM  CHIEF COMPLAINT: Chest pain, weakness  HPI: Pt is a 59 y.o. female with history of hypertension, hypothyroidism who presents to the emergency department with complaints of an episode of central chest tightness that started at 10 PM while at rest. She states she had associated soreness of breath, nausea, dizziness and diaphoresis. Also reports she felt very clammy and weak. Pain did not radiate. No aggravating or relieving factors. States pain has completely resolved without intervention. Husband drove her to the hospital. She is in a family history of a mother who had coronary artery disease and died of a heart attack in her 68s. She has not had any provocative testing in many years. No prior history of cardiac disease. No history of PE or DVT. No palpitations. She states she's concerned that her potassium may be low she states she has felt very weak and feels that this is similar to when she has had hypokalemia before. Reports that she thinks she is on diuretics - hydrochlorothiazide. It is unclear if she is taking potassium at home. Denies recent vomiting or diarrhea. No fever or cough.  ROS: See HPI Constitutional: no fever  Eyes: no drainage  ENT: no runny nose   Cardiovascular:   chest pain  Resp:  SOB  GI: no vomiting GU: no dysuria Integumentary: no rash  Allergy: no hives  Musculoskeletal: no leg swelling  Neurological: no slurred speech ROS otherwise negative  PAST MEDICAL HISTORY/PAST SURGICAL HISTORY:  Past Medical History  Diagnosis Date  . Herniated disc   . Thyroid disease     Hypothyroidism  . Anxiety     Insomnia  . Depression     Insomnia  . Allergy   . Hypertension   . Osteoporosis     MEDICATIONS:  Prior to Admission medications   Medication Sig Start Date End Date Taking? Authorizing Provider  albuterol (PROVENTIL HFA;VENTOLIN HFA) 108 (90 BASE) MCG/ACT inhaler Inhale 2 puffs into the lungs every 6 (six) hours as needed for wheezing.  05/09/15  Yes Babs Sciara, MD  ALPRAZolam (XANAX) 0.5 MG tablet Take 1 tablet (0.5 mg total) by mouth at bedtime as needed for anxiety. 01/09/15  Yes Campbell Riches, NP  amLODipine (NORVASC) 5 MG tablet Take 1 tablet (5 mg total) by mouth daily. 06/13/15  Yes Campbell Riches, NP  diclofenac (VOLTAREN) 75 MG EC tablet Take 1 tablet (75 mg total) by mouth 2 (two) times daily. 05/09/15  Yes Babs Sciara, MD  indapamide (LOZOL) 2.5 MG tablet TAKE 1/2 TO 1 TABLET BY MOUTH EVERY DAY FOR SWELLING 02/14/15  Yes Campbell Riches, NP  loratadine (CLARITIN) 10 MG tablet Take 1 tablet (10 mg total) by mouth daily as needed. FOR ALLERGIES 11/10/13  Yes Campbell Riches, NP  potassium chloride (K-DUR,KLOR-CON) 10 MEQ tablet TAKE 1 TABLET BY MOUTH EVERY DAY FOR POTASSIUM 02/14/15  Yes Campbell Riches, NP  ranitidine (ZANTAC) 300 MG tablet Take 1 tablet (300 mg total) by mouth at bedtime. 07/04/15  Yes Campbell Riches, NP  amoxicillin-clavulanate (AUGMENTIN) 875-125 MG per tablet Take 1 tablet by mouth 2 (two) times daily. Patient not taking: Reported on 07/04/2015 05/09/15   Babs Sciara, MD  cefPROZIL (CEFZIL) 500 MG tablet Take 1 tablet twice a day for 10 days. Patient not taking: Reported on 07/04/2015 05/24/15   Babs Sciara, MD  doxycycline (VIBRAMYCIN) 100 MG capsule Take 1 capsule (100 mg total) by mouth 2 (two) times  daily. Patient not taking: Reported on 05/02/2015 03/06/15   Merlyn AlbertWilliam S Luking, MD    ALLERGIES:  Allergies  Allergen Reactions  . Fosamax [Alendronate Sodium] Other (See Comments)    GI Headache  . Trazodone And Nefazodone   . Vioxx [Rofecoxib]     Per patient  . Losartan Cough  . Sulfa Antibiotics Hives    SOCIAL HISTORY:  Social History  Substance Use Topics  . Smoking status: Passive Smoke Exposure - Never Smoker  . Smokeless tobacco: Never Used  . Alcohol Use: No    FAMILY HISTORY: Family History  Problem Relation Age of Onset  . Diabetes Father     EXAM: BP  144/92 mmHg  Pulse 74  Temp(Src) 97.6 F (36.4 C) (Oral)  Resp 16  Ht 5\' 2"  (1.575 m)  Wt 214 lb (97.07 kg)  BMI 39.13 kg/m2  SpO2 93% CONSTITUTIONAL: Alert and oriented and responds appropriately to questions. Well-appearing; well-nourished HEAD: Normocephalic EYES: Conjunctivae clear, PERRL ENT: normal nose; no rhinorrhea; moist mucous membranes; pharynx without lesions noted NECK: Supple, no meningismus, no LAD  CARD: RRR; S1 and S2 appreciated; no murmurs, no clicks, no rubs, no gallops RESP: Normal chest excursion without splinting or tachypnea; breath sounds clear and equal bilaterally; no wheezes, no rhonchi, no rales, no hypoxia or respiratory distress, speaking full sentences ABD/GI: Normal bowel sounds; non-distended; soft, non-tender, no rebound, no guarding, no peritoneal signs BACK:  The back appears normal and is non-tender to palpation, there is no CVA tenderness EXT: Normal ROM in all joints; non-tender to palpation; no edema; normal capillary refill; no cyanosis, no calf tenderness or swelling    SKIN: Normal color for age and race; warm NEURO: Moves all extremities equally, sensation to light touch intact diffusely, cranial nerves II through XII intact PSYCH: The patient's mood and manner are appropriate. Grooming and personal hygiene are appropriate.  MEDICAL DECISION MAKING: Patient here with episode of chest pain. She does have risk factors for ACS including age, family history and history of hypertension. No recent provocative testing. EKG shows no ischemic changes. First troponin is negative. She does have a low potassium of 2.6 with no interval changes. Given her low potassium and concerning story for chest pain, will admit for replacement of her potassium and chest pain rule out. Discussed with Dr. Sharl MaLama with hospitalist service who agrees with admission to telemetry, observation. Patient is currently asymptomatic other than feeling weak. No current chest pain. Patient  and husband at bedside comfortable with this plan.     EKG Interpretation  Date/Time:  Friday July 19 2015 23:05:03 EDT Ventricular Rate:  76 PR Interval:  189 QRS Duration: 108 QT Interval:  426 QTC Calculation: 479 R Axis:   59 Text Interpretation:  Sinus rhythm No significant change since last tracing Confirmed by Barnie Sopko,  DO, Griselda Tosh [phone]([id number]) on 07/20/2015 12:14:15 AM        Layla MawKristen N Jaimey Franchini, DO 07/20/15 [id number]

## 2015-07-25 ENCOUNTER — Encounter: Payer: Self-pay | Admitting: Nurse Practitioner

## 2015-07-25 ENCOUNTER — Ambulatory Visit (INDEPENDENT_AMBULATORY_CARE_PROVIDER_SITE_OTHER): Payer: BLUE CROSS/BLUE SHIELD | Admitting: Nurse Practitioner

## 2015-07-25 VITALS — BP 130/80 | Ht 62.0 in | Wt 214.5 lb

## 2015-07-25 DIAGNOSIS — K219 Gastro-esophageal reflux disease without esophagitis: Secondary | ICD-10-CM | POA: Diagnosis not present

## 2015-07-25 DIAGNOSIS — E876 Hypokalemia: Secondary | ICD-10-CM

## 2015-07-26 ENCOUNTER — Encounter: Payer: Self-pay | Admitting: Nurse Practitioner

## 2015-07-26 NOTE — Progress Notes (Signed)
Subjective:  Presents following admission to the hospital on 10/21 4 hypokalemia. Had sudden onset generalized fatigue and muscle weakness.Patient had her blood work done through her employer on 10/13 and her potassium was close to normal at 3.4. Has a history of hypokalemia, was not taken her potassium. No changes in medications or new OTC supplements during the week her potassium dropped. The only thing that changed was urinary frequency. Is currently on K. Dur 40 mEq daily. Also reflux was worse while she was in the hospital, the doctor told her to continue Zantac in the evening and start pantoprazole in the morning. Has seen some improvement.  Objective:   BP 130/80 mmHg  Ht 5\' 2"  (1.575 m)  Wt 214 lb 8 oz (97.297 kg)  BMI 39.22 kg/m2 NAD. Alert, oriented.lungs clear. Heart regular rate rhythm. Abdominal exam no epigastric area tenderness noted.hemoglobin A1c on labs dated 10/13 was 6.0.significant central obesity noted.  Assessment:  Problem List Items Addressed This Visit      Digestive   GERD (gastroesophageal reflux disease)     Other   Hypokalemia - Primary   Relevant Orders   Potassium     Plan: continue potassium supplement daily as directed. Recheck potassium in 2-3 weeks. Hold on Zantac. Continue pantoprazole daily at bedtime. Encourage weight loss particularly around the abdominal area. Return in about 6 months (around 01/23/2016) for recheck.

## 2015-08-02 LAB — POTASSIUM: POTASSIUM: 3.8 mmol/L (ref 3.5–5.2)

## 2015-08-13 ENCOUNTER — Telehealth: Payer: Self-pay | Admitting: Internal Medicine

## 2015-08-13 NOTE — Telephone Encounter (Signed)
Recall for tcs °

## 2015-08-13 NOTE — Telephone Encounter (Signed)
Letter mailed to pt.  

## 2015-08-20 ENCOUNTER — Encounter: Payer: Self-pay | Admitting: Family Medicine

## 2015-08-20 ENCOUNTER — Ambulatory Visit (INDEPENDENT_AMBULATORY_CARE_PROVIDER_SITE_OTHER): Payer: BLUE CROSS/BLUE SHIELD | Admitting: Nurse Practitioner

## 2015-08-20 ENCOUNTER — Encounter: Payer: Self-pay | Admitting: Nurse Practitioner

## 2015-08-20 VITALS — BP 122/80 | Temp 98.2°F | Ht 62.0 in | Wt 208.0 lb

## 2015-08-20 DIAGNOSIS — K529 Noninfective gastroenteritis and colitis, unspecified: Secondary | ICD-10-CM

## 2015-08-20 MED ORDER — ONDANSETRON 8 MG PO TBDP: 8.0000 mg | ORAL_TABLET | Freq: Three times a day (TID) | ORAL | Status: DC | PRN

## 2015-08-20 NOTE — Progress Notes (Signed)
Subjective:  Presents for complaints of vomiting and diarrhea that began yesterday evening. Occurred multiple times. Diarrhea has stopped. Last vomiting episode was over 8 hours ago. Has been taking small amounts of water and Sprite. Has urinated some. No dysuria. No fever. Mild soreness in the abdominal area. No known contacts.  Objective:   BP 122/80 mmHg  Temp(Src) 98.2 F (36.8 C) (Oral)  Ht 5\' 2"  (1.575 m)  Wt 208 lb (94.348 kg)  BMI 38.03 kg/m2 NAD. Alert, oriented. Lungs clear. Heart regular rate rhythm. Abdomen obese soft nondistended with active bowel sounds 4; mild epigastric area tenderness noted, otherwise benign.  Assessment: Gastroenteritis, acute  Plan:  Meds ordered this encounter  Medications  . ondansetron (ZOFRAN-ODT) 8 MG disintegrating tablet    Sig: Take 1 tablet (8 mg total) by mouth every 8 (eight) hours as needed for nausea or vomiting.    Dispense:  30 tablet    Refill:  0    Order Specific Question:  Supervising Provider    Answer:  Riccardo DubinLUKING, WILLIAM S [[id number]]   Encourage patient to drink Gatorade to replenish her potassium since she has a tendency to become hypokalemic. Increase clear fluid intake. Gradually resume regular diet. Warning signs reviewed. Call back if symptoms worsen or persist.

## 2015-09-20 ENCOUNTER — Other Ambulatory Visit: Payer: Self-pay | Admitting: Nurse Practitioner

## 2015-09-20 ENCOUNTER — Telehealth: Payer: Self-pay | Admitting: Family Medicine

## 2015-09-20 MED ORDER — POTASSIUM CHLORIDE CRYS ER 20 MEQ PO TBCR
40.0000 meq | EXTENDED_RELEASE_TABLET | Freq: Every day | ORAL | Status: DC
Start: 2015-09-20 — End: 2015-11-04

## 2015-09-20 NOTE — Telephone Encounter (Signed)
(  Message for [facility]) patient needs new prescription for potassium chloride 20 mg,she takes 2 daily called into [facility] [place]. She states took last pills this morning.

## 2015-09-21 ENCOUNTER — Other Ambulatory Visit: Payer: Self-pay | Admitting: Nurse Practitioner

## 2015-10-09 ENCOUNTER — Telehealth: Payer: Self-pay | Admitting: Nurse Practitioner

## 2015-10-09 NOTE — Telephone Encounter (Signed)
Jessica Kirby  Pt seen 11/22 for sinus issues, was not given anything at the time Is calling today to say she has not gotten any better at this point an  Wants to know if you will call in something  [facility]  Advised its been longer than a month an may need to be seen

## 2015-10-10 ENCOUNTER — Other Ambulatory Visit: Payer: Self-pay | Admitting: Nurse Practitioner

## 2015-10-10 MED ORDER — AMOXICILLIN-POT CLAVULANATE 875-125 MG PO TABS: 1.0000 | ORAL_TABLET | Freq: Two times a day (BID) | ORAL | Status: DC

## 2015-10-10 NOTE — Telephone Encounter (Signed)
Patient notified

## 2015-10-10 NOTE — Telephone Encounter (Signed)
Sent in antibiotics. Please remind patient to use allergy meds daily. Part of this is the crazy weather! Call back if persists.

## 2015-11-04 ENCOUNTER — Ambulatory Visit (INDEPENDENT_AMBULATORY_CARE_PROVIDER_SITE_OTHER): Payer: BLUE CROSS/BLUE SHIELD | Admitting: Nurse Practitioner

## 2015-11-04 ENCOUNTER — Encounter: Payer: Self-pay | Admitting: Nurse Practitioner

## 2015-11-04 VITALS — BP 138/86 | Temp 98.1°F | Ht 62.0 in | Wt 208.0 lb

## 2015-11-04 DIAGNOSIS — K219 Gastro-esophageal reflux disease without esophagitis: Secondary | ICD-10-CM | POA: Diagnosis not present

## 2015-11-04 DIAGNOSIS — I10 Essential (primary) hypertension: Secondary | ICD-10-CM

## 2015-11-04 DIAGNOSIS — F419 Anxiety disorder, unspecified: Secondary | ICD-10-CM

## 2015-11-04 DIAGNOSIS — M8580 Other specified disorders of bone density and structure, unspecified site: Secondary | ICD-10-CM

## 2015-11-04 DIAGNOSIS — E876 Hypokalemia: Secondary | ICD-10-CM

## 2015-11-04 DIAGNOSIS — M19042 Primary osteoarthritis, left hand: Secondary | ICD-10-CM | POA: Diagnosis not present

## 2015-11-04 DIAGNOSIS — M19041 Primary osteoarthritis, right hand: Secondary | ICD-10-CM

## 2015-11-04 DIAGNOSIS — E039 Hypothyroidism, unspecified: Secondary | ICD-10-CM

## 2015-11-04 DIAGNOSIS — J029 Acute pharyngitis, unspecified: Secondary | ICD-10-CM | POA: Diagnosis not present

## 2015-11-04 DIAGNOSIS — Z13220 Encounter for screening for lipoid disorders: Secondary | ICD-10-CM | POA: Diagnosis not present

## 2015-11-04 MED ORDER — AMLODIPINE BESYLATE 5 MG PO TABS: ORAL_TABLET | ORAL | Status: DC

## 2015-11-04 MED ORDER — POTASSIUM CHLORIDE CRYS ER 20 MEQ PO TBCR: 20.0000 meq | EXTENDED_RELEASE_TABLET | Freq: Every day | ORAL | Status: DC

## 2015-11-04 MED ORDER — PANTOPRAZOLE SODIUM 40 MG PO TBEC: 40.0000 mg | DELAYED_RELEASE_TABLET | Freq: Every day | ORAL | Status: DC

## 2015-11-04 MED ORDER — ALPRAZOLAM 0.5 MG PO TABS: 0.5000 mg | ORAL_TABLET | Freq: Every evening | ORAL | Status: DC | PRN

## 2015-11-04 MED ORDER — INDAPAMIDE 2.5 MG PO TABS: ORAL_TABLET | ORAL | Status: DC

## 2015-11-04 NOTE — Patient Instructions (Signed)
paraffin wax treatments Glucosamine chondroitin

## 2015-11-05 LAB — STREP A DNA PROBE: STREP GP A DIRECT, DNA PROBE: NEGATIVE

## 2015-11-06 ENCOUNTER — Encounter: Payer: Self-pay | Admitting: Nurse Practitioner

## 2015-11-06 NOTE — Progress Notes (Signed)
Subjective:  Presents for routine follow-up. Continues to be under tremendous amounts of stress. Working 56 hours per week or more, mostly 7 days per week. Averaging 2 days off per month. Takes occasional Xanax only as needed for extreme stress. No chest pain/ischemic type pain or shortness of breath. Having some generalized cramping and pain in both lower legs. Occasional generalized headache. Some fatigue. In addition her husband has been very sick and patient is helping with his care. History of hypokalemia. Compliant with medications-see list. Struggles with diet due to very busy work schedule. Having a mild sore throat for the past couple of days. No fever. Mild head congestion. Minimal cough and runny nose.  Objective:   BP 138/86 mmHg  Temp(Src) 98.1 F (36.7 C) (Oral)  Ht  (1.575 m)  Wt 208 lb (94.348 kg)  BMI 38.03 kg/m2 NAD. Alert, oriented. Calm affect. TMs mild clear effusion, no erythema. Pharynx minimally erythematous, RST negative. Neck supple with mild soft anterior adenopathy. Lungs clear. Heart regular rate rhythm. Abdomen soft nondistended with minimal epigastric area tenderness. Of note patient has severe nodularity in the fingers of both hands worse on the right with slight curvature of the fingers.  Assessment:  Problem List Items Addressed This Visit      Cardiovascular and Mediastinum   Hypertension - Primary   Relevant Medications   amLODipine (NORVASC) 5 MG tablet   indapamide (LOZOL) 2.5 MG tablet   Other Relevant Orders   Lipid panel   TSH   VITAMIN D 25 Hydroxy (Vit-D Deficiency, Fractures)   Potassium     Digestive   GERD (gastroesophageal reflux disease)   Relevant Medications   pantoprazole (PROTONIX) 40 MG tablet   Other Relevant Orders   Lipid panel   TSH   VITAMIN D 25 Hydroxy (Vit-D Deficiency, Fractures)   Potassium     Endocrine   Hypothyroidism (Chronic)   Relevant Orders   Lipid panel   TSH   VITAMIN D 25 Hydroxy (Vit-D Deficiency,  Fractures)   Potassium     Musculoskeletal and Integument   Osteopenia   Relevant Orders   Lipid panel   TSH   VITAMIN D 25 Hydroxy (Vit-D Deficiency, Fractures)   Potassium   Primary osteoarthritis of both hands   Relevant Orders   Lipid panel   TSH   VITAMIN D 25 Hydroxy (Vit-D Deficiency, Fractures)   Potassium     Other   Anxiety   Relevant Medications   ALPRAZolam (XANAX) 0.5 MG tablet   Other Relevant Orders   Lipid panel   TSH   VITAMIN D 25 Hydroxy (Vit-D Deficiency, Fractures)   Potassium   Hypokalemia   Relevant Orders   Lipid panel   TSH   VITAMIN D 25 Hydroxy (Vit-D Deficiency, Fractures)   Potassium    Other Visit Diagnoses    Acute pharyngitis, unspecified etiology        Relevant Orders    POCT rapid strep A    Strep A DNA probe (Completed)    Screening for lipid disorders         \  Discussed importance of stress reduction. Recommend patient take daily vitamin D supplement. Continue current medications. Lab work and throat culture pending. Return in about 6 months (around 05/03/2016) for physical.

## 2015-11-18 ENCOUNTER — Telehealth: Payer: Self-pay | Admitting: Nurse Practitioner

## 2015-11-18 LAB — VITAMIN D 25 HYDROXY (VIT D DEFICIENCY, FRACTURES): VIT D 25 HYDROXY: 19.5 ng/mL — AB (ref 30.0–100.0)

## 2015-11-18 LAB — TSH: TSH: 2.25 u[IU]/mL (ref 0.450–4.500)

## 2015-11-18 LAB — POTASSIUM: Potassium: 3.4 mmol/L — ABNORMAL LOW (ref 3.5–5.2)

## 2015-11-18 LAB — LIPID PANEL
CHOLESTEROL TOTAL: 204 mg/dL — AB (ref 100–199)
Chol/HDL Ratio: 4.2 ratio units (ref 0.0–4.4)
HDL: 49 mg/dL (ref >39)
LDL Calculated: 130 mg/dL — ABNORMAL HIGH (ref 0–99)
Triglycerides: 126 mg/dL (ref 0–149)
VLDL CHOLESTEROL CAL: 25 mg/dL (ref 5–40)

## 2015-11-18 NOTE — Telephone Encounter (Signed)
Patient calling to get results to recent lab work. °

## 2015-11-19 ENCOUNTER — Other Ambulatory Visit: Payer: Self-pay | Admitting: Nurse Practitioner

## 2015-11-19 MED ORDER — POTASSIUM CHLORIDE CRYS ER 20 MEQ PO TBCR: 40.0000 meq | EXTENDED_RELEASE_TABLET | Freq: Every day | ORAL | Status: DC

## 2015-11-19 MED ORDER — VITAMIN D (ERGOCALCIFEROL) 1.25 MG (50000 UNIT) PO CAPS: 50000.0000 [IU] | ORAL_CAPSULE | ORAL | Status: DC

## 2015-11-19 NOTE — Telephone Encounter (Signed)
See result note on labs.

## 2015-11-29 ENCOUNTER — Other Ambulatory Visit: Payer: Self-pay | Admitting: Nurse Practitioner

## 2015-11-29 ENCOUNTER — Telehealth: Payer: Self-pay | Admitting: Nurse Practitioner

## 2015-11-29 MED ORDER — AZITHROMYCIN 250 MG PO TABS: ORAL_TABLET | ORAL | Status: DC

## 2015-11-29 NOTE — Telephone Encounter (Signed)
[facility] (need to get more information)

## 2015-11-29 NOTE — Telephone Encounter (Signed)
Patient states that she is having a scratchy throat, congestion is pale in color, drainage, left eye burning. No fever, sob or wheezing.

## 2015-11-29 NOTE — Telephone Encounter (Signed)
Pt is wanting to know if something can be called in for a scratchy throat and stuffed up nose.      [facility]

## 2015-11-29 NOTE — Telephone Encounter (Signed)
Antibiotic sent in. Office visit next week if no better.

## 2015-11-29 NOTE — Telephone Encounter (Signed)
Left message on voicemail notifying patient  

## 2015-12-02 ENCOUNTER — Telehealth: Payer: Self-pay | Admitting: Nurse Practitioner

## 2015-12-02 NOTE — Telephone Encounter (Signed)
error 

## 2015-12-03 ENCOUNTER — Ambulatory Visit (INDEPENDENT_AMBULATORY_CARE_PROVIDER_SITE_OTHER): Payer: BLUE CROSS/BLUE SHIELD | Admitting: Family Medicine

## 2015-12-03 ENCOUNTER — Encounter: Payer: Self-pay | Admitting: Family Medicine

## 2015-12-03 VITALS — Temp 98.4°F | Ht 62.0 in | Wt 207.1 lb

## 2015-12-03 DIAGNOSIS — B9689 Other specified bacterial agents as the cause of diseases classified elsewhere: Secondary | ICD-10-CM

## 2015-12-03 DIAGNOSIS — J019 Acute sinusitis, unspecified: Secondary | ICD-10-CM

## 2015-12-03 MED ORDER — LEVOFLOXACIN 500 MG PO TABS: 500.0000 mg | ORAL_TABLET | Freq: Every day | ORAL | Status: DC

## 2015-12-03 NOTE — Progress Notes (Signed)
   Subjective:    Patient ID: Jessica Kirby, female    DOB: 1955/11/10, 60 y.o.   MRN: [id number]  Sinusitis This is a new problem. The current episode started in the past 7 days. The problem is unchanged. There has been no fever. Associated symptoms include coughing, ear pain and headaches. (Wheezing) Treatments tried: zpak. The treatment provided no relief.   PMH benign   Review of Systems  HENT: Positive for ear pain.   Respiratory: Positive for cough.   Neurological: Positive for headaches.       Objective:   Physical Exam Lungs clear hearts regular HEENT is benign sinus moderate tenderness       Assessment & Plan:  Viral syndrome secondary rhinosinusitis Antibodies prescribed warning signs discussed follow-up if problems

## 2015-12-05 ENCOUNTER — Encounter: Payer: Self-pay | Admitting: Family Medicine

## 2015-12-05 ENCOUNTER — Telehealth: Payer: Self-pay | Admitting: Family Medicine

## 2015-12-05 MED ORDER — BENZONATATE 100 MG PO CAPS: 100.0000 mg | ORAL_CAPSULE | Freq: Three times a day (TID) | ORAL | Status: DC | PRN

## 2015-12-05 NOTE — Telephone Encounter (Signed)
Discussed with patient. Patient would like to try [employer]. Rx sent electronically to pharmacy. Patient notified and would like to pick up work note when ready.

## 2015-12-05 NOTE — Telephone Encounter (Signed)
[facility] 12/05/15 

## 2015-12-05 NOTE — Telephone Encounter (Signed)
Work excuse done

## 2015-12-05 NOTE — Telephone Encounter (Signed)
Left message to return call 

## 2015-12-05 NOTE — Telephone Encounter (Signed)
Patient was seen by doctor Lorin PicketScott on 3/7 and given Z-pak and now has a real bad cough and wanting something called into [facility] [place].

## 2015-12-05 NOTE — Telephone Encounter (Signed)
She may have a work note as requested return to work on the 13th. As for the cough there are only 2 choices other then what is available over-the-counter. Tessalon 100 mg 1 3 times a day when necessary cough, #21, 1 refill or Hycodan 1 teaspoon every 6 hours when necessary caution drowsiness not for long-term use 4 ounces no refill-if her illness gets worse she may need to be rechecked

## 2015-12-05 NOTE — Telephone Encounter (Signed)
Spoke with patient to get more information. Patient stated that she is still experiencing hacking productive cough, and hoarse voice. States cough gets worst with movement and at bed time. Patient would like something else called in for cough. Also patient is trying to get FMLA so she would like a work note extended out for her to return to work on 12/09/15. Please advise?

## 2016-01-01 ENCOUNTER — Encounter (HOSPITAL_COMMUNITY): Payer: Self-pay

## 2016-01-27 ENCOUNTER — Telehealth: Payer: Self-pay | Admitting: Internal Medicine

## 2016-01-27 NOTE — Telephone Encounter (Signed)
Pt received triage letter from November and would like to schedule her colonoscopy the week of July 14th, She gets off work at [employer]4pm and can be reached on her cell [phone]

## 2016-01-30 ENCOUNTER — Telehealth: Payer: Self-pay

## 2016-01-30 NOTE — Telephone Encounter (Signed)
Pt is aware I do not have the July schedule yet. She will call me back in early June.

## 2016-01-30 NOTE — Telephone Encounter (Signed)
See separate note. Pt wants July appt.

## 2016-01-30 NOTE — Telephone Encounter (Signed)
Patient received letter to schedule tcs   Please call after 4pm [phone]

## 2016-02-25 ENCOUNTER — Telehealth: Payer: Self-pay | Admitting: Nurse Practitioner

## 2016-02-25 NOTE — Telephone Encounter (Signed)
Pt wants to know if Jessica Kirby will re run her Labs to check her Potassium and Vit D levels? States she has been taking her supplements and feels its better.   Please advise

## 2016-02-27 ENCOUNTER — Other Ambulatory Visit: Payer: Self-pay | Admitting: Nurse Practitioner

## 2016-02-27 DIAGNOSIS — E559 Vitamin D deficiency, unspecified: Secondary | ICD-10-CM

## 2016-02-27 DIAGNOSIS — E876 Hypokalemia: Secondary | ICD-10-CM

## 2016-02-27 NOTE — Telephone Encounter (Signed)
Done. Please notify patient.

## 2016-02-27 NOTE — Telephone Encounter (Signed)
[facility] 02/27/16 

## 2016-02-27 NOTE — Telephone Encounter (Signed)
Spoke with patient and informed her per Nathaneil Canaryarolyn Hoskins,NP- labs ordered. Patient verbalized understanding.

## 2016-03-02 ENCOUNTER — Telehealth: Payer: Self-pay

## 2016-03-02 NOTE — Telephone Encounter (Signed)
Pt received letter from [facility] in November and she wanted to wait to schedule her colonoscopy until July. I told her that DS does her triages between 130-430. Pt said that she doesn't get off work until 4 and she would call [facility] when she got home this afternoon. I told patient that I would let [facility] be aware that she had called.

## 2016-03-03 ENCOUNTER — Telehealth: Payer: Self-pay

## 2016-03-03 LAB — POTASSIUM: POTASSIUM: 3.2 mmol/L — AB (ref 3.5–5.2)

## 2016-03-03 LAB — VITAMIN D 25 HYDROXY (VIT D DEFICIENCY, FRACTURES): Vit D, 25-Hydroxy: 41.6 ng/mL (ref 30.0–100.0)

## 2016-03-03 NOTE — Telephone Encounter (Signed)
I recevied a VM from pt. I called and told her that I will be here until 5:00 today. Please call me back when she can and I will try to speak with her.

## 2016-03-04 NOTE — Telephone Encounter (Signed)
Triaged pt for 04/07/2016. Will need to update triage.

## 2016-03-05 ENCOUNTER — Other Ambulatory Visit: Payer: Self-pay | Admitting: Nurse Practitioner

## 2016-03-05 MED ORDER — POTASSIUM CHLORIDE CRYS ER 20 MEQ PO TBCR: EXTENDED_RELEASE_TABLET | ORAL | Status: DC

## 2016-03-16 NOTE — Telephone Encounter (Addendum)
Gastroenterology Pre-Procedure Review  Request Date: 03/03/2016 Requesting Physician:   PATIENT REVIEW QUESTIONS: The patient responded to the following health history questions as indicated:    1. Diabetes Melitis: no 2. Joint replacements in the past 12 months: no 3. Major health problems in the past 3 months: no 4. Has an artificial valve or MVP: no 5. Has a defibrillator: no 6. Has been advised in past to take antibiotics in advance of a procedure like teeth cleaning: no 7. Family history of colon cancer: no  8. Alcohol Use: no 9. History of sleep apnea: no     MEDICATIONS & ALLERGIES:    Patient reports the following regarding taking any blood thinners:   Plavix? no Aspirin? no Coumadin? no  Patient confirms/reports the following medications:  Current Outpatient Prescriptions  Medication Sig Dispense Refill  . albuterol (PROVENTIL HFA;VENTOLIN HFA) 108 (90 BASE) MCG/ACT inhaler Inhale 2 puffs into the lungs every 6 (six) hours as needed for wheezing. 1 Inhaler 2  . ALPRAZolam (XANAX) 0.5 MG tablet Take 1 tablet (0.5 mg total) by mouth at bedtime as needed for anxiety. (Patient taking differently: Take 0.5 mg by mouth at bedtime as needed for anxiety. Pt said she takes the Xanax very seldom now) 30 tablet 2  . amLODipine (NORVASC) 5 MG tablet TAKE 1 TABLET(5 MG) BY MOUTH DAILY 30 tablet 5  . diclofenac (VOLTAREN) 75 MG EC tablet Take 1 tablet (75 mg total) by mouth 2 (two) times daily. (Patient taking differently: Take 75 mg by mouth 2 (two) times daily as needed (arthritis). ) 28 tablet 2  . fexofenadine (ALLEGRA) 180 MG tablet Take 180 mg by mouth daily as needed for allergies or rhinitis.    . indapamide (LOZOL) 2.5 MG tablet TAKE 1/2 TO 1 TABLET BY MOUTH EVERY DAY FOR SWELLING 30 tablet 5  . NON FORMULARY Vitamin D  OTC  1000 IU   daily    . pantoprazole (PROTONIX) 40 MG tablet Take 1 tablet (40 mg total) by mouth daily. Prn acid reflux 30 tablet 5  . potassium chloride SA  (K-DUR,KLOR-CON) 20 MEQ tablet Take 3 tablets (total 60 meq) per day. 90 tablet 5   No current facility-administered medications for this visit.    Patient confirms/reports the following allergies:  Allergies  Allergen Reactions  . Fosamax [Alendronate Sodium] Other (See Comments)    GI Headache  . Vioxx [Rofecoxib]     Per patient  . Losartan Cough  . Sulfa Antibiotics Hives    No orders of the defined types were placed in this encounter.    AUTHORIZATION INFORMATION Primary Insurance:  ID #:   Group #:  Pre-Cert / Auth required:  Pre-Cert / Auth #:   Secondary Insurance:   ID #:   Group #:  Pre-Cert / Auth required: Pre-Cert / Auth #:   SCHEDULE INFORMATION: Procedure has been scheduled as follows:  Date:  04/07/2016                Time:  8:30 AM Location: [facility] Stay  This Gastroenterology Pre-Precedure Review Form is being routed to the following provider(s): R. Roetta SessionsMichael Rourk, MD

## 2016-03-16 NOTE — Telephone Encounter (Signed)
OK to schedule

## 2016-03-18 MED ORDER — PEG 3350-KCL-NA BICARB-NACL 420 G PO SOLR: 4000.0000 mL | ORAL | Status: DC

## 2016-03-18 NOTE — Telephone Encounter (Signed)
Rx sent to the pharmacy and instructions mailed to pt.  

## 2016-03-26 NOTE — Telephone Encounter (Signed)
NO PA NEEDED REF# [id number]

## 2016-03-30 NOTE — Telephone Encounter (Signed)
Patient called to confirm her appt for her tcs on next week.  Routing to [place] to make sure patient is on the schedule for Endo

## 2016-04-01 ENCOUNTER — Other Ambulatory Visit: Payer: Self-pay

## 2016-04-01 DIAGNOSIS — Z1211 Encounter for screening for malignant neoplasm of colon: Secondary | ICD-10-CM

## 2016-04-01 NOTE — Telephone Encounter (Signed)
Thanks Cambal[place]ille, confirmed.

## 2016-04-02 ENCOUNTER — Telehealth: Payer: Self-pay | Admitting: Nurse Practitioner

## 2016-04-02 NOTE — Telephone Encounter (Signed)
Patient states she takes potassium every day.  She is getting ready to do a colonoscopy 04/07/16 and the prep for that has potassium in it.  She wants to know if her taking her daily dose of potassium and this prep will be too much of a dose of potassium for her? Please advise.

## 2016-04-02 NOTE — Telephone Encounter (Signed)
This is very unlikely to be a problem as low as your potassium usually runs. I would check with specialist doing colonoscopy about how much potassium is in your prep.

## 2016-04-02 NOTE — Telephone Encounter (Signed)
Discussed with patient. Patient advised This is very unlikely to be a problem as low as your potassium usually runs. Jessica JonesCarolyn  would check with specialist doing colonoscopy about how much potassium is in your prep. Patient verbalized understanding.

## 2016-04-02 NOTE — Telephone Encounter (Signed)
Left message to return call 

## 2016-04-07 ENCOUNTER — Encounter (HOSPITAL_COMMUNITY): Payer: Self-pay | Admitting: *Deleted

## 2016-04-07 ENCOUNTER — Encounter (HOSPITAL_COMMUNITY)
Admission: RE | Disposition: A | Discharge status: Home / Self Care | Payer: Self-pay | Source: Ambulatory Visit | Attending: Internal Medicine

## 2016-04-07 ENCOUNTER — Ambulatory Visit (HOSPITAL_COMMUNITY)
Admission: RE | Admit: 2016-04-07 | Discharge: 2016-04-07 | Disposition: A | Discharge status: Home / Self Care | Payer: BLUE CROSS/BLUE SHIELD | Source: Ambulatory Visit | Attending: Internal Medicine | Admitting: Internal Medicine

## 2016-04-07 DIAGNOSIS — F329 Major depressive disorder, single episode, unspecified: Secondary | ICD-10-CM | POA: Diagnosis not present

## 2016-04-07 DIAGNOSIS — F419 Anxiety disorder, unspecified: Secondary | ICD-10-CM | POA: Diagnosis not present

## 2016-04-07 DIAGNOSIS — M810 Age-related osteoporosis without current pathological fracture: Secondary | ICD-10-CM | POA: Insufficient documentation

## 2016-04-07 DIAGNOSIS — Z79899 Other long term (current) drug therapy: Secondary | ICD-10-CM | POA: Diagnosis not present

## 2016-04-07 DIAGNOSIS — Z833 Family history of diabetes mellitus: Secondary | ICD-10-CM | POA: Insufficient documentation

## 2016-04-07 DIAGNOSIS — E039 Hypothyroidism, unspecified: Secondary | ICD-10-CM | POA: Diagnosis not present

## 2016-04-07 DIAGNOSIS — Z882 Allergy status to sulfonamides status: Secondary | ICD-10-CM | POA: Insufficient documentation

## 2016-04-07 DIAGNOSIS — K5730 Diverticulosis of large intestine without perforation or abscess without bleeding: Secondary | ICD-10-CM | POA: Insufficient documentation

## 2016-04-07 DIAGNOSIS — Z7722 Contact with and (suspected) exposure to environmental tobacco smoke (acute) (chronic): Secondary | ICD-10-CM | POA: Insufficient documentation

## 2016-04-07 DIAGNOSIS — I10 Essential (primary) hypertension: Secondary | ICD-10-CM | POA: Insufficient documentation

## 2016-04-07 DIAGNOSIS — G4700 Insomnia, unspecified: Secondary | ICD-10-CM | POA: Insufficient documentation

## 2016-04-07 DIAGNOSIS — Z888 Allergy status to other drugs, medicaments and biological substances status: Secondary | ICD-10-CM | POA: Insufficient documentation

## 2016-04-07 DIAGNOSIS — Z90710 Acquired absence of both cervix and uterus: Secondary | ICD-10-CM | POA: Diagnosis not present

## 2016-04-07 DIAGNOSIS — Z1211 Encounter for screening for malignant neoplasm of colon: Secondary | ICD-10-CM | POA: Diagnosis present

## 2016-04-07 DIAGNOSIS — K219 Gastro-esophageal reflux disease without esophagitis: Secondary | ICD-10-CM | POA: Insufficient documentation

## 2016-04-07 HISTORY — DX: Hypothyroidism, unspecified: E03.9

## 2016-04-07 HISTORY — DX: Gastro-esophageal reflux disease without esophagitis: K21.9

## 2016-04-07 HISTORY — PX: COLONOSCOPY: SHX5424

## 2016-04-07 SURGERY — COLONOSCOPY
Anesthesia: Moderate Sedation

## 2016-04-07 MED ORDER — MIDAZOLAM HCL 5 MG/5ML IJ SOLN
INTRAMUSCULAR | Status: DC | PRN
  Administered 2016-04-07: 2 mg via INTRAVENOUS
  Administered 2016-04-07: 1 mg via INTRAVENOUS
  Administered 2016-04-07: 2 mg via INTRAVENOUS
  Administered 2016-04-07 (×2): 1 mg via INTRAVENOUS

## 2016-04-07 MED ORDER — ONDANSETRON HCL 4 MG/2ML IJ SOLN
INTRAMUSCULAR | Status: DC | PRN
  Administered 2016-04-07: 4 mg via INTRAVENOUS

## 2016-04-07 MED ORDER — SODIUM CHLORIDE 0.9 % IV SOLN
INTRAVENOUS | Status: DC
  Administered 2016-04-07: 08:00:00 via INTRAVENOUS

## 2016-04-07 MED ORDER — MEPERIDINE HCL 100 MG/ML IJ SOLN
INTRAMUSCULAR | Status: AC
  Filled 2016-04-07: qty 2

## 2016-04-07 MED ORDER — STERILE WATER FOR IRRIGATION IR SOLN
Status: DC | PRN
  Administered 2016-04-07: 08:00:00

## 2016-04-07 MED ORDER — MEPERIDINE HCL 100 MG/ML IJ SOLN
INTRAMUSCULAR | Status: DC | PRN
  Administered 2016-04-07: 50 mg via INTRAVENOUS
  Administered 2016-04-07: 25 mg via INTRAVENOUS
  Administered 2016-04-07: 50 mg via INTRAVENOUS

## 2016-04-07 MED ORDER — ONDANSETRON HCL 4 MG/2ML IJ SOLN
INTRAMUSCULAR | Status: AC
  Filled 2016-04-07: qty 2

## 2016-04-07 MED ORDER — MIDAZOLAM HCL 5 MG/5ML IJ SOLN
INTRAMUSCULAR | Status: AC
  Filled 2016-04-07: qty 10

## 2016-04-07 NOTE — H&P (Signed)
 @   Primary Care Physician:  Lubertha South, MD Primary Gastroenterologist:  Dr. Jena Gauss  Pre-Procedure History & Physical: HPI:  Jessica Kirby is a 60 y.o. female is here for a screening colonoscopy.   No bowel symptoms. Negative colonoscopy a little over 10 years ago per report. No family history of colon cancer.  Past Medical History  Diagnosis Date  . Herniated disc   . Thyroid disease     Hypothyroidism  . Anxiety     Insomnia  . Depression     Insomnia  . Allergy   . Hypertension   . Osteoporosis   . GERD (gastroesophageal reflux disease)   . Hypothyroidism     not medicated at this time    Past Surgical History  Procedure Laterality Date  . Tubal ligation    . Wrist surgery    . Abdominal hysterectomy      Prior to Admission medications   Medication Sig Start Date End Date Taking? Authorizing Provider  albuterol (PROVENTIL HFA;VENTOLIN HFA) 108 (90 BASE) MCG/ACT inhaler Inhale 2 puffs into the lungs every 6 (six) hours as needed for wheezing. 05/09/15  Yes Babs Sciara, MD  amLODipine (NORVASC) 5 MG tablet TAKE 1 TABLET(5 MG) BY MOUTH DAILY 11/04/15  Yes Campbell Riches, NP  indapamide (LOZOL) 2.5 MG tablet TAKE 1/2 TO 1 TABLET BY MOUTH EVERY DAY FOR SWELLING 11/04/15  Yes Campbell Riches, NP  NON FORMULARY Vitamin D  OTC  1000 IU   daily   Yes Historical Provider, MD  pantoprazole (PROTONIX) 40 MG tablet Take 1 tablet (40 mg total) by mouth daily. Prn acid reflux 11/04/15  Yes Campbell Riches, NP  polyethylene glycol-electrolytes (TRILYTE) 420 g solution Take 4,000 mLs by mouth as directed. 03/18/16  Yes Corbin Ade, MD  potassium chloride SA (K-DUR,KLOR-CON) 20 MEQ tablet Take 3 tablets (total 60 meq) per day. Patient taking differently: Take 2 tablets (total 40 meq) per day. 03/05/16  Yes Campbell Riches, NP  ranitidine (ZANTAC) 300 MG tablet Take 300 mg by mouth daily.  03/05/16  Yes Historical Provider, MD  ALPRAZolam Prudy Feeler) 0.5 MG tablet Take 1 tablet  (0.5 mg total) by mouth at bedtime as needed for anxiety. Patient taking differently: Take 0.5 mg by mouth at bedtime as needed for anxiety. Pt said she takes the Xanax very seldom now 11/04/15   Campbell Riches, NP  diclofenac (VOLTAREN) 75 MG EC tablet Take 1 tablet (75 mg total) by mouth 2 (two) times daily. Patient taking differently: Take 75 mg by mouth 2 (two) times daily as needed (arthritis).  05/09/15   Babs Sciara, MD    Allergies as of 04/01/2016 - Review Complete 03/03/2016  Allergen Reaction Noted  . Fosamax [alendronate sodium] Other (See Comments) 12/21/2012  . Vioxx [rofecoxib]  12/21/2012  . Losartan Cough 06/13/2015  . Sulfa antibiotics Hives 09/10/2011    Family History  Problem Relation Age of Onset  . Diabetes Father   . Colon cancer Neg Hx     Social History   Social History  . Marital Status: Married    Spouse Name: N/A  . Number of Children: N/A  . Years of Education: N/A   Occupational History  . Not on file.   Social History Main Topics  . Smoking status: Passive Smoke Exposure - Never Smoker  . Smokeless tobacco: Never Used  . Alcohol Use: No  . Drug Use: No  . Sexual Activity: Yes  Other Topics Concern  . Not on file   Social History Narrative    Review of Systems: See HPI, otherwise negative ROS  Physical Exam: BP 119/75 mmHg  Pulse 79  Temp(Src) 98.8 F (37.1 C) (Oral)  Resp 16  Ht 5\' 2"  (1.575 m)  Wt 207 lb (93.895 kg)  BMI 37.85 kg/m2  SpO2 95% General:   Alert,  Well-developed, well-nourished, pleasant and cooperative in NAD Head:  Normocephalic and atraumatic. Eyes:  Sclera clear, no icterus.   Conjunctiva pink. Neck:  Supple; no masses or thyromegaly. Lungs:  Clear throughout to auscultation.   No wheezes, crackles, or rhonchi. No acute distress. Heart:  Regular rate and rhythm; no murmurs, clicks, rubs,  or gallops. Abdomen:  Soft, nontender and nondistended. No masses, hepatosplenomegaly or hernias noted. Normal  bowel sounds, without guarding, and without rebound.    Impression/Plan: [name] is now here to undergo a screening colonoscopy.  Average risk screening examination.  Risks, benefits, limitations, imponderables and alternatives regarding colonoscopy have been reviewed with the patient. Questions have been answered. All parties agreeable.     Notice:  This dictation was prepared with Dragon dictation along with smaller phrase technology. Any transcriptional errors that result from this process are unintentional and may not be corrected upon review.

## 2016-04-07 NOTE — Op Note (Signed)
[facility] Patient Name: Jessica JuneKathy Cumberland Procedure Date: [facility]7/07/2016 8:15 AM MRN: [id number] Date of Birth: 02/01/1956 Attending MD: Gennette Pacobert Michael Montanna Mcbain , MD CSN: [id number] Age: 60 Admit Type: Outpatient Procedure:                Colonoscopy - average risk screening Indications:              Screening for colorectal malignant neoplasm Providers:                Gennette Pacobert Michael Shalon Salado, MD, Nena PolioLisa Moore, RN, Tammy                            Vaught, RN, Lollie Marrowaylor B. Lemons, Technician Referring MD:              Medicines:                Midazolam 7 mg IV, Meperidine 125 mg IV Complications:            No immediate complications. Estimated Blood Loss:     Estimated blood loss: none. Procedure:                Pre-Anesthesia Assessment:                           - Prior to the procedure, a History and Physical                            was performed, and patient medications and                            allergies were reviewed. The patient's tolerance of                            previous anesthesia was also reviewed. The risks                            and benefits of the procedure and the sedation                            options and risks were discussed with the patient.                            All questions were answered, and informed consent                            was obtained. Prior Anticoagulants: The patient has                            taken no previous anticoagulant or antiplatelet                            agents. ASA Grade Assessment: II - A patient with                            mild systemic disease. After reviewing the risks  and benefits, the patient was deemed in                            satisfactory condition to undergo the procedure.                           After obtaining informed consent, the colonoscope                            was passed under direct vision. Throughout the                            procedure, the  patient's blood pressure, pulse, and                            oxygen saturations were monitored continuously. The                            [id number]) was introduced through the anus                            and advanced to the the cecum, identified by                            appendiceal orifice and ileocecal valve. The                            colonoscopy was performed with ease. The patient                            tolerated the procedure well. The quality of the                            bowel preparation was adequate. The ileocecal                            valve, appendiceal orifice, and rectum were                            photographed. The entire colon was well visualized. Scope In: 8:32:58 AM Scope Out: 8:47:18 AM Scope Withdrawal Time: 0 hours 6 minutes 47 seconds  Total Procedure Duration: 0 hours 14 minutes 20 seconds  Findings:      The perianal and digital rectal examinations were normal.      Multiple medium-mouthed diverticula were found in the sigmoid colon and       descending colon. Otherwise, the remainder of the colonic mucosa       appeared normal. Rectal mucosa appeared normal including retroflexion. Impression:               - Diverticulosis in the sigmoid colon and in the                            descending colon.                           -  No specimens collected. Moderate Sedation:      Moderate (conscious) sedation was administered by the endoscopy nurse       and supervised by the endoscopist. The following parameters were       monitored: oxygen saturation, heart rate, blood pressure, respiratory       rate, EKG, adequacy of pulmonary ventilation, and response to care.       Total physician intraservice time was 24 minutes. Recommendation:           - Patient has a contact number available for                            emergencies. The signs and symptoms of potential                            delayed complications were discussed  with the                            patient. Return to normal activities tomorrow.                            Written discharge instructions were provided to the                            patient.                           - Advance diet as tolerated.                           - Continue present medications.                           - Repeat colonoscopy in 10 years for screening                            purposes.                           - Return to GI office PRN. Procedure Code(s):        --- Professional ---                           [phone], Colonoscopy, flexible; diagnostic, including                            collection of specimen(s) by brushing or washing,                            when performed (separate procedure)                           99152, Moderate sedation services provided by the                            same physician or other qualified health care  professional performing the diagnostic or                            therapeutic service that the sedation supports,                            requiring the presence of an independent trained                            observer to assist in the monitoring of the                            patient's level of consciousness and physiological                            status; initial 15 minutes of intraservice time,                            patient age 42 years or older                           [phone], Moderate sedation services; each additional                            15 minutes intraservice time Diagnosis Code(s):        --- Professional ---                           Z12.11, Encounter for screening for malignant                            neoplasm of colon                           K57.30, Diverticulosis of large intestine without                            perforation or abscess without bleeding CPT copyright 2016 American Medical Association. All rights reserved. The codes documented  in this report are preliminary and upon coder review may  be revised to meet current compliance requirements. Gerrit Friends. Eleaner Dibartolo, MD Gennette Pac, MD 04/07/2016 8:55:22 AM This report has been signed electronically. Number of Addenda: 0

## 2016-04-07 NOTE — Discharge Instructions (Signed)
°  Colonoscopy Discharge Instructions  Read the instructions outlined below and refer to this sheet in the next few weeks. These discharge instructions provide you with general information on caring for yourself after you leave the hospital. Your doctor may also give you specific instructions. While your treatment has been planned according to the most current medical practices available, unavoidable complications occasionally occur. If you have any problems or questions after discharge, call Dr. Jena Gaussourk at [phone]. ACTIVITY  You may resume your regular activity, but move at a slower pace for the next 24 hours.   Take frequent rest periods for the next 24 hours.   Walking will help get rid of the air and reduce the bloated feeling in your belly (abdomen).   No driving for 24 hours (because of the medicine (anesthesia) used during the test).    Do not sign any important legal documents or operate any machinery for 24 hours (because of the anesthesia used during the test).  NUTRITION  Drink plenty of fluids.   You may resume your normal diet as instructed by your doctor.   Begin with a light meal and progress to your normal diet. Heavy or fried foods are harder to digest and may make you feel sick to your stomach (nauseated).   Avoid alcoholic beverages for 24 hours or as instructed.  MEDICATIONS  You may resume your normal medications unless your doctor tells you otherwise.  WHAT YOU CAN EXPECT TODAY  Some feelings of bloating in the abdomen.   Passage of more gas than usual.   Spotting of blood in your stool or on the toilet paper.  IF YOU HAD POLYPS REMOVED DURING THE COLONOSCOPY:  No aspirin products for 7 days or as instructed.   No alcohol for 7 days or as instructed.   Eat a soft diet for the next 24 hours.  FINDING OUT THE RESULTS OF YOUR TEST Not all test results are available during your visit. If your test results are not back during the visit, make an appointment  with your caregiver to find out the results. Do not assume everything is normal if you have not heard from your caregiver or the medical facility. It is important for you to follow up on all of your test results.  SEEK IMMEDIATE MEDICAL ATTENTION IF:  You have more than a spotting of blood in your stool.   Your belly is swollen (abdominal distention).   You are nauseated or vomiting.   You have a temperature over 101.   You have abdominal pain or discomfort that is severe or gets worse throughout the day.    Diverticulosis information provided  Repeat screening colonoscopy in 10 years

## 2016-04-09 ENCOUNTER — Telehealth: Payer: Self-pay | Admitting: Family Medicine

## 2016-04-09 NOTE — Telephone Encounter (Signed)
Discussed with pt. Pt verbalized understanding.  °

## 2016-04-09 NOTE — Telephone Encounter (Signed)
Likely temporary fluid retention, may take an extra fluid pill ea morn next three d then go back to reg dose, if persists rec ov

## 2016-04-09 NOTE — Telephone Encounter (Signed)
Having bilateral swelling in both legs and feet from calf down. No pain. No shortness of breath. Taking indapamide 2.5mg  one daily. Started 2 days ago

## 2016-04-09 NOTE — Telephone Encounter (Signed)
Pt is stating that since she had a colonoscopy on 7/11 that her ankles  And feet are swelling. If she stands for any great length of time she  Gets leg swelling. She is taking her fluid pills and wonders if this is  Normal, should she be concerned or is should she increase her fluid Pill by 1 more a day?   Please advise  [place]

## 2016-04-10 ENCOUNTER — Encounter (HOSPITAL_COMMUNITY): Payer: Self-pay | Admitting: Internal Medicine

## 2016-04-28 ENCOUNTER — Encounter: Payer: BLUE CROSS/BLUE SHIELD | Admitting: Nurse Practitioner

## 2016-05-31 ENCOUNTER — Other Ambulatory Visit: Payer: Self-pay | Admitting: Nurse Practitioner

## 2016-06-15 ENCOUNTER — Ambulatory Visit (INDEPENDENT_AMBULATORY_CARE_PROVIDER_SITE_OTHER): Payer: BLUE CROSS/BLUE SHIELD | Admitting: Family Medicine

## 2016-06-15 ENCOUNTER — Ambulatory Visit (HOSPITAL_COMMUNITY)
Admission: RE | Admit: 2016-06-15 | Discharge: 2016-06-15 | Disposition: A | Discharge status: Home / Self Care | Payer: BLUE CROSS/BLUE SHIELD | Source: Ambulatory Visit | Attending: Family Medicine | Admitting: Family Medicine

## 2016-06-15 ENCOUNTER — Encounter: Payer: Self-pay | Admitting: Family Medicine

## 2016-06-15 VITALS — BP 128/84 | Ht 62.0 in | Wt 214.8 lb

## 2016-06-15 DIAGNOSIS — M25561 Pain in right knee: Secondary | ICD-10-CM

## 2016-06-15 MED ORDER — ETODOLAC 400 MG PO TABS: 400.0000 mg | ORAL_TABLET | Freq: Two times a day (BID) | ORAL | 0 refills | Status: DC

## 2016-06-15 NOTE — Progress Notes (Signed)
   Subjective:    Patient ID: Jessica Kirby, female    DOB: 09/05/1956, 60 y.o.   MRN: [id number]  HPI Patient arrives with c/o right knee pain for 3 weeks - gave out on her yesterday when trying to get on boat. Recalls no sudden injury Seven d per wk , working 56 hrs per wk,    Has noted some swelling. Mild in nature. No history of remote injury  Tried a few over-the-counter tablets without much help Osteoporosis in the family   Review of Systems    No headache, no major weight loss or weight gain, no chest pain no back pain abdominal pain no change in bowel habits complete ROS otherwise negative  Objective:   Physical Exam Alert vital stable HEENT normal lungs clear heart rare rhythm right knee slight crepitations some medial joint line tenderness no joint laxity.       Assessment & Plan:  Impression knee strain potential medial meniscus injury plan knee x-ray. Local measures discussed anti-inflammatory medicine persists return in will workup further

## 2016-06-26 ENCOUNTER — Telehealth: Payer: Self-pay | Admitting: Family Medicine

## 2016-06-26 DIAGNOSIS — M25561 Pain in right knee: Secondary | ICD-10-CM

## 2016-06-26 NOTE — Telephone Encounter (Signed)
Left message return call 06/26/16 (referral ordered in epic

## 2016-06-26 NOTE — Telephone Encounter (Signed)
Patient seen for right knee pain on 06/15/16.  She said that her leg is steadily getting worse and would like referral to orthopedic.

## 2016-06-26 NOTE — Telephone Encounter (Signed)
Lets do, let pt know we will work on, actually I though  I got  arequest on this a while bk

## 2016-06-29 NOTE — Telephone Encounter (Signed)
Pt.notified

## 2016-07-01 ENCOUNTER — Encounter: Payer: Self-pay | Admitting: Nurse Practitioner

## 2016-07-01 ENCOUNTER — Ambulatory Visit (INDEPENDENT_AMBULATORY_CARE_PROVIDER_SITE_OTHER): Payer: BLUE CROSS/BLUE SHIELD | Admitting: Nurse Practitioner

## 2016-07-01 ENCOUNTER — Other Ambulatory Visit: Payer: Self-pay | Admitting: Nurse Practitioner

## 2016-07-01 VITALS — BP 130/82 | Ht 63.0 in | Wt 214.0 lb

## 2016-07-01 DIAGNOSIS — Z1231 Encounter for screening mammogram for malignant neoplasm of breast: Secondary | ICD-10-CM

## 2016-07-01 DIAGNOSIS — Z780 Asymptomatic menopausal state: Secondary | ICD-10-CM | POA: Diagnosis not present

## 2016-07-01 DIAGNOSIS — E876 Hypokalemia: Secondary | ICD-10-CM

## 2016-07-01 DIAGNOSIS — Z01419 Encounter for gynecological examination (general) (routine) without abnormal findings: Secondary | ICD-10-CM | POA: Diagnosis not present

## 2016-07-01 NOTE — Progress Notes (Addendum)
   Subjective:    Patient ID: Jessica Kirby, female    DOB: 07/26/1956, 60 y.o.   MRN: [id number]  HPI Presents for her wellness exam. Very active job. According to patient, has had an abd hyst with BSO. Same sexual partner. Regular vision and dental exams.     Review of Systems  Constitutional: Negative for activity change, appetite change and fatigue.  HENT: Negative for dental problem, ear pain, sinus pressure and sore throat.   Respiratory: Negative for cough, chest tightness, shortness of breath and wheezing.   Cardiovascular: Negative for chest pain.  Gastrointestinal: Negative for abdominal distention, abdominal pain, blood in stool, constipation, diarrhea, nausea and vomiting.  Genitourinary: Negative for difficulty urinating, dysuria, enuresis, frequency, genital sores, pelvic pain, urgency and vaginal discharge.       Objective:   Physical Exam  Constitutional: She is oriented to person, place, and time. She appears well-developed. No distress.  HENT:  Right Ear: External ear normal.  Left Ear: External ear normal.  Mouth/Throat: Oropharynx is clear and moist.  Neck: Normal range of motion. Neck supple. No tracheal deviation present. No thyromegaly present.  Cardiovascular: Normal rate, regular rhythm and normal heart sounds.  Exam reveals no gallop.   No murmur heard. Pulmonary/Chest: Effort normal and breath sounds normal.  Abdominal: Soft. She exhibits no distension. There is no tenderness.  Genitourinary: Vagina normal. No vaginal discharge found.  Genitourinary Comments: External GU: no rashes or lesions; vagina no discharge; no CMT; bimanual exam: no tenderness or masses; limited due to abd girth. Rectal exam: no masses or stool for hemoccult.   Musculoskeletal: She exhibits no edema.  Lymphadenopathy:    She has no cervical adenopathy.  Neurological: She is alert and oriented to person, place, and time.  Skin: Skin is warm and dry. No rash noted.  Psychiatric:  She has a normal mood and affect. Her behavior is normal.  Vitals reviewed. breast exam: no masses noted; axillae no adenopathy.         Assessment & Plan:   Problem List Items Addressed This Visit      Other   Hypokalemia   Relevant Orders   Potassium    Other Visit Diagnoses    Well woman exam    -  Primary   Post-menopausal       Relevant Orders   DG Bone Density   Visit for screening mammogram       Relevant Orders   MM DIGITAL SCREENING BILATERAL     Given Rx for Zostavax. Also recommend Dt vaccine. Plans to get flu vaccine through work. Recommend daily vitamin D and calcium.  Return in about 6 months (around 12/30/2016) for office visit with routine labs.

## 2016-07-02 ENCOUNTER — Ambulatory Visit (INDEPENDENT_AMBULATORY_CARE_PROVIDER_SITE_OTHER): Payer: BLUE CROSS/BLUE SHIELD | Admitting: Orthopaedic Surgery

## 2016-07-02 DIAGNOSIS — M7051 Other bursitis of knee, right knee: Secondary | ICD-10-CM | POA: Diagnosis not present

## 2016-07-03 ENCOUNTER — Ambulatory Visit (HOSPITAL_COMMUNITY)
Admission: RE | Admit: 2016-07-03 | Discharge: 2016-07-03 | Disposition: A | Discharge status: Home / Self Care | Payer: BLUE CROSS/BLUE SHIELD | Source: Ambulatory Visit | Attending: Family Medicine | Admitting: Family Medicine

## 2016-07-03 DIAGNOSIS — M8580 Other specified disorders of bone density and structure, unspecified site: Secondary | ICD-10-CM | POA: Diagnosis not present

## 2016-07-03 DIAGNOSIS — Z780 Asymptomatic menopausal state: Secondary | ICD-10-CM | POA: Diagnosis not present

## 2016-07-04 LAB — POTASSIUM: POTASSIUM: 4 mmol/L (ref 3.5–5.2)

## 2016-07-06 ENCOUNTER — Ambulatory Visit (HOSPITAL_COMMUNITY): Payer: BLUE CROSS/BLUE SHIELD

## 2016-07-06 ENCOUNTER — Ambulatory Visit (HOSPITAL_COMMUNITY)
Admission: RE | Admit: 2016-07-06 | Discharge: 2016-07-06 | Disposition: A | Discharge status: Home / Self Care | Payer: BLUE CROSS/BLUE SHIELD | Source: Ambulatory Visit | Attending: Family Medicine | Admitting: Family Medicine

## 2016-07-06 DIAGNOSIS — Z1231 Encounter for screening mammogram for malignant neoplasm of breast: Secondary | ICD-10-CM | POA: Insufficient documentation

## 2016-07-07 ENCOUNTER — Other Ambulatory Visit (HOSPITAL_COMMUNITY): Payer: BLUE CROSS/BLUE SHIELD

## 2016-07-12 ENCOUNTER — Encounter: Payer: Self-pay | Admitting: Family Medicine

## 2016-07-15 ENCOUNTER — Telehealth: Payer: Self-pay | Admitting: Family Medicine

## 2016-07-15 NOTE — Telephone Encounter (Signed)
Sorry her labs did not drop into my results basket. Continue 40 meq per day of potassium for now. Level is back to normal.

## 2016-07-15 NOTE — Telephone Encounter (Signed)
Left message return call 07/15/16 

## 2016-07-15 NOTE — Telephone Encounter (Signed)
Patient notified continue 40 meq per day of potassium for now. Level is back to normal. Patient verbalized understanding.

## 2016-07-15 NOTE — Telephone Encounter (Signed)
Patient would like to discuss her recent lab results with the nurse.

## 2016-07-15 NOTE — Telephone Encounter (Signed)
Patient calling to get the results to her recent lab work and recommendations.

## 2016-08-05 ENCOUNTER — Other Ambulatory Visit: Payer: Self-pay | Admitting: Nurse Practitioner

## 2016-08-14 ENCOUNTER — Other Ambulatory Visit: Payer: Self-pay | Admitting: Nurse Practitioner

## 2016-08-18 ENCOUNTER — Ambulatory Visit (INDEPENDENT_AMBULATORY_CARE_PROVIDER_SITE_OTHER): Payer: BLUE CROSS/BLUE SHIELD | Admitting: Nurse Practitioner

## 2016-08-18 ENCOUNTER — Encounter: Payer: Self-pay | Admitting: Nurse Practitioner

## 2016-08-18 VITALS — BP 132/88 | Temp 97.8°F | Wt 215.2 lb

## 2016-08-18 DIAGNOSIS — B9689 Other specified bacterial agents as the cause of diseases classified elsewhere: Secondary | ICD-10-CM

## 2016-08-18 DIAGNOSIS — J019 Acute sinusitis, unspecified: Secondary | ICD-10-CM | POA: Diagnosis not present

## 2016-08-18 DIAGNOSIS — J0100 Acute maxillary sinusitis, unspecified: Secondary | ICD-10-CM | POA: Diagnosis not present

## 2016-08-18 MED ORDER — LEVOFLOXACIN 500 MG PO TABS: 500.0000 mg | ORAL_TABLET | Freq: Every day | ORAL | 0 refills | Status: DC

## 2016-08-20 ENCOUNTER — Encounter: Payer: Self-pay | Admitting: Nurse Practitioner

## 2016-08-20 NOTE — Progress Notes (Signed)
Subjective:  Presents for c/o possible sinus infection that began about a week ago. No fever. Irritated, scratchy throat. Left sided maxillary headache and pressure. Cough mainly in the am with slight color. No wheezing. Left sided ear pain.   Objective:   BP 132/88   Temp 97.8 F (36.6 C)   Wt 215 lb 3.2 oz (97.6 kg)   BMI 38.12 kg/m  NAD. Alert, oriented. TMs retracted bilat; more on the left; no erythema. Pharynx very erythematous with PND noted. Neck supple with mild anterior adenopathy. Lungs clear. Heart RRR   Assessment: Acute bacterial rhinosinusitis  Acute non-recurrent maxillary sinusitis  Plan:  Meds ordered this encounter  Medications  . levofloxacin (LEVAQUIN) 500 MG tablet    Sig: Take 1 tablet (500 mg total) by mouth daily.    Dispense:  10 tablet    Refill:  0    Order Specific Question:   Supervising Provider    Answer:   Merlyn AlbertLUKING, WILLIAM S [[id number]]   Continue OTC meds as directed. Defer steroids at this time. Call back if worsens or persists.

## 2016-10-24 ENCOUNTER — Other Ambulatory Visit: Payer: Self-pay | Admitting: Nurse Practitioner

## 2016-10-24 ENCOUNTER — Other Ambulatory Visit: Payer: Self-pay | Admitting: Family Medicine

## 2016-12-10 ENCOUNTER — Telehealth: Payer: Self-pay | Admitting: Family Medicine

## 2016-12-10 ENCOUNTER — Other Ambulatory Visit: Payer: Self-pay | Admitting: Nurse Practitioner

## 2016-12-10 DIAGNOSIS — Z13220 Encounter for screening for lipoid disorders: Secondary | ICD-10-CM

## 2016-12-10 DIAGNOSIS — E039 Hypothyroidism, unspecified: Secondary | ICD-10-CM

## 2016-12-10 DIAGNOSIS — R5383 Other fatigue: Secondary | ICD-10-CM

## 2016-12-10 DIAGNOSIS — I10 Essential (primary) hypertension: Secondary | ICD-10-CM

## 2016-12-10 DIAGNOSIS — E876 Hypokalemia: Secondary | ICD-10-CM

## 2016-12-10 NOTE — Telephone Encounter (Signed)
Labs ordered. Print out at nurses desk.

## 2016-12-10 NOTE — Telephone Encounter (Signed)
Spoke with patient and informed her per Nathaneil Canaryarolyn Hoskins,NP- Labs ordered for your upcoming appointment. Advised patient to fast. Patient verbalized understanding.

## 2016-12-10 NOTE — Telephone Encounter (Signed)
Left message return call 12/10/2016

## 2016-12-10 NOTE — Telephone Encounter (Signed)
Patient is requesting labs to be ordered for her appointment on 01/14/17 with Eber Jonesarolyn.

## 2016-12-15 ENCOUNTER — Encounter: Payer: Self-pay | Admitting: Family Medicine

## 2016-12-15 ENCOUNTER — Ambulatory Visit (INDEPENDENT_AMBULATORY_CARE_PROVIDER_SITE_OTHER): Payer: BLUE CROSS/BLUE SHIELD | Admitting: Family Medicine

## 2016-12-15 VITALS — BP 128/88 | Temp 97.8°F | Ht 62.0 in | Wt 219.1 lb

## 2016-12-15 DIAGNOSIS — J019 Acute sinusitis, unspecified: Secondary | ICD-10-CM

## 2016-12-15 DIAGNOSIS — B9689 Other specified bacterial agents as the cause of diseases classified elsewhere: Secondary | ICD-10-CM | POA: Diagnosis not present

## 2016-12-15 MED ORDER — AMOXICILLIN-POT CLAVULANATE 875-125 MG PO TABS: 1.0000 | ORAL_TABLET | Freq: Two times a day (BID) | ORAL | 0 refills | Status: DC

## 2016-12-15 NOTE — Progress Notes (Signed)
   Subjective:    Patient ID: Jessica AlarKathy S Crochet, female    DOB: 01/01/1956, 61 y.o.   MRN: [id number]  Sinusitis  This is a new problem. The current episode started in the past 7 days. Associated symptoms include congestion, ear pain, headaches and a sore throat. (Runny nose, ) Treatments tried: Nasacort, Xyzal    Patient states no other concerns this visit. PMH benign  Review of Systems  HENT: Positive for congestion, ear pain and sore throat.   Neurological: Positive for headaches.   Denies wheezing difficulty breathing    Objective:   Physical Exam Lungs clear heart regular HEENT benign moderate sinus tenderness       Assessment & Plan:  Viral syndrome Secondary rhinosinusitis Antibiotics prescribed warnings discussed Follow-up if problems

## 2016-12-24 ENCOUNTER — Encounter: Payer: Self-pay | Admitting: Nurse Practitioner

## 2016-12-24 ENCOUNTER — Ambulatory Visit (INDEPENDENT_AMBULATORY_CARE_PROVIDER_SITE_OTHER): Payer: BLUE CROSS/BLUE SHIELD | Admitting: Nurse Practitioner

## 2016-12-24 VITALS — BP 128/82 | Temp 98.0°F | Ht 62.0 in | Wt 220.2 lb

## 2016-12-24 DIAGNOSIS — J0100 Acute maxillary sinusitis, unspecified: Secondary | ICD-10-CM

## 2016-12-24 MED ORDER — PREDNISONE 20 MG PO TABS: ORAL_TABLET | ORAL | 0 refills | Status: DC

## 2016-12-24 MED ORDER — METHYLPREDNISOLONE ACETATE 40 MG/ML IJ SUSP
40.0000 mg | Freq: Once | INTRAMUSCULAR | Status: AC
  Administered 2016-12-24: 40 mg via INTRAMUSCULAR

## 2016-12-25 ENCOUNTER — Encounter: Payer: Self-pay | Admitting: Nurse Practitioner

## 2016-12-25 NOTE — Progress Notes (Signed)
Subjective:  Presents for c/o bilat ear pain and pressure. No fever. No sore throat. Maxillary area headache and pressure. Mild cough mainly in am; some color to sputum. Began about a week ago. Completing a course of Augmentin. Using Nasacort.    Objective:   BP 128/82   Temp 98 F (36.7 C) (Oral)   Ht  (1.575 m)   Wt 220 lb 3.2 oz (99.9 kg)   BMI 40.28 kg/m  NAD. Alert, oriented. TMs retracted, no erythema. Pharynx injected with PND noted. Neck supple with mild adenopathy. Lungs clear. Heart RRR.   Assessment:  Acute non-recurrent maxillary sinusitis - Plan: methylPREDNISolone acetate (DEPO-MEDROL) injection 40 mg    Plan:   Meds ordered this encounter  Medications  . DISCONTD: predniSONE (DELTASONE) 20 MG tablet    Sig: 3 po qd x 3 d then 2 po qd x 3 d then 1 po qd x 2 d    Dispense:  17 tablet    Refill:  0    Order Specific Question:   Supervising Provider    Answer:   Merlyn Albert [[id number]]  . methylPREDNISolone acetate (DEPO-MEDROL) injection 40 mg  . predniSONE (DELTASONE) 20 MG tablet    Sig: 3 po qd x 3 d then 2 po qd x 3 d then 1 po qd x 2 d    Dispense:  17 tablet    Refill:  0    Hold until patient calls for medication. Thanks.    Order Specific Question:   Supervising Provider    Answer:   Merlyn Albert [[id number]]   Complete Augmentin as directed. If no improvement in next 48 hours, start Prednisone. Call back if worsens or persists.

## 2017-01-07 LAB — LIPID PANEL
Chol/HDL Ratio: 4 ratio (ref 0.0–4.4)
Cholesterol, Total: 206 mg/dL — ABNORMAL HIGH (ref 100–199)
HDL: 52 mg/dL (ref >39)
LDL Calculated: 131 mg/dL — ABNORMAL HIGH (ref 0–99)
TRIGLYCERIDES: 117 mg/dL (ref 0–149)
VLDL Cholesterol Cal: 23 mg/dL (ref 5–40)

## 2017-01-07 LAB — CBC WITH DIFFERENTIAL/PLATELET
BASOS ABS: 0.1 10*3/uL (ref 0.0–0.2)
BASOS: 1 %
EOS (ABSOLUTE): 0.3 10*3/uL (ref 0.0–0.4)
Eos: 3 %
Hematocrit: 42.1 % (ref 34.0–46.6)
Hemoglobin: 13.9 g/dL (ref 11.1–15.9)
IMMATURE GRANULOCYTES: 0 %
Immature Grans (Abs): 0 10*3/uL (ref 0.0–0.1)
Lymphocytes Absolute: 3.2 10*3/uL — ABNORMAL HIGH (ref 0.7–3.1)
Lymphs: 34 %
MCH: 27.9 pg (ref 26.6–33.0)
MCHC: 33 g/dL (ref 31.5–35.7)
MCV: 84 fL (ref 79–97)
MONOS ABS: 0.5 10*3/uL (ref 0.1–0.9)
Monocytes: 5 %
NEUTROS PCT: 57 %
Neutrophils Absolute: 5.3 10*3/uL (ref 1.4–7.0)
PLATELETS: 318 10*3/uL (ref 150–379)
RBC: 4.99 x10E6/uL (ref 3.77–5.28)
RDW: 12.8 % (ref 12.3–15.4)
WBC: 9.3 10*3/uL (ref 3.4–10.8)

## 2017-01-07 LAB — TSH: TSH: 3.86 u[IU]/mL (ref 0.450–4.500)

## 2017-01-07 LAB — BASIC METABOLIC PANEL
BUN/Creatinine Ratio: 19 (ref 12–28)
BUN: 13 mg/dL (ref 8–27)
CALCIUM: 9.2 mg/dL (ref 8.7–10.3)
CHLORIDE: 106 mmol/L (ref 96–106)
CO2: 28 mmol/L (ref 18–29)
Creatinine, Ser: 0.7 mg/dL (ref 0.57–1.00)
GFR calc Af Amer: 107 mL/min/{1.73_m2} (ref >59)
GFR, EST NON AFRICAN AMERICAN: 93 mL/min/{1.73_m2} (ref >59)
GLUCOSE: 100 mg/dL — AB (ref 65–99)
Potassium: 4.4 mmol/L (ref 3.5–5.2)
SODIUM: 146 mmol/L — AB (ref 134–144)

## 2017-01-07 LAB — HEPATIC FUNCTION PANEL
ALT: 15 IU/L (ref 0–32)
AST: 16 IU/L (ref 0–40)
Albumin: 4.3 g/dL (ref 3.6–4.8)
Alkaline Phosphatase: 84 IU/L (ref 39–117)
BILIRUBIN TOTAL: 0.6 mg/dL (ref 0.0–1.2)
Bilirubin, Direct: 0.12 mg/dL (ref 0.00–0.40)
TOTAL PROTEIN: 6.7 g/dL (ref 6.0–8.5)

## 2017-01-11 ENCOUNTER — Telehealth: Payer: Self-pay | Admitting: Nurse Practitioner

## 2017-01-11 NOTE — Telephone Encounter (Signed)
Patient wanted to know if Jessica Kirby had her bloodwork results yet.  Said she has been feeling kinda "funny" and was wondering if her potassium was off.  She is at work and said to call her cell and leave her a message and she would call back.

## 2017-01-14 ENCOUNTER — Encounter: Payer: Self-pay | Admitting: Nurse Practitioner

## 2017-01-14 ENCOUNTER — Ambulatory Visit (INDEPENDENT_AMBULATORY_CARE_PROVIDER_SITE_OTHER): Payer: BLUE CROSS/BLUE SHIELD | Admitting: Nurse Practitioner

## 2017-01-14 VITALS — BP 136/82 | Temp 98.2°F | Ht 62.0 in | Wt 217.0 lb

## 2017-01-14 DIAGNOSIS — R0609 Other forms of dyspnea: Secondary | ICD-10-CM

## 2017-01-14 DIAGNOSIS — I10 Essential (primary) hypertension: Secondary | ICD-10-CM | POA: Diagnosis not present

## 2017-01-14 DIAGNOSIS — F419 Anxiety disorder, unspecified: Secondary | ICD-10-CM

## 2017-01-14 DIAGNOSIS — K219 Gastro-esophageal reflux disease without esophagitis: Secondary | ICD-10-CM

## 2017-01-14 DIAGNOSIS — R079 Chest pain, unspecified: Secondary | ICD-10-CM

## 2017-01-14 DIAGNOSIS — E876 Hypokalemia: Secondary | ICD-10-CM | POA: Diagnosis not present

## 2017-01-14 DIAGNOSIS — R0600 Dyspnea, unspecified: Secondary | ICD-10-CM

## 2017-01-14 MED ORDER — POTASSIUM CHLORIDE CRYS ER 20 MEQ PO TBCR: 60.0000 meq | EXTENDED_RELEASE_TABLET | Freq: Every day | ORAL | 5 refills | Status: DC

## 2017-01-14 MED ORDER — INDAPAMIDE 2.5 MG PO TABS: ORAL_TABLET | ORAL | 5 refills | Status: DC

## 2017-01-14 MED ORDER — ALPRAZOLAM 0.5 MG PO TABS: 0.5000 mg | ORAL_TABLET | Freq: Every evening | ORAL | 2 refills | Status: DC | PRN

## 2017-01-14 MED ORDER — AMLODIPINE BESYLATE 5 MG PO TABS: ORAL_TABLET | ORAL | 5 refills | Status: DC

## 2017-01-14 NOTE — Telephone Encounter (Signed)
Results discussed with patient. Patient advised Blood sugar is borderline at 100. Very important to lose a little weight and cut back on sugar and simple carbs. Potassium is good. No anemia. Liver and kidney function normal. Cholesterol has changed very little. Recommend daily ASA 81 mg coated per day. Continue current meds. Recheck if her symptoms persist.  Patient verbalized understanding and stated she has a follow up office visit today with Eber Jones to discuss.

## 2017-01-14 NOTE — Telephone Encounter (Signed)
Left message to return call 

## 2017-01-14 NOTE — Telephone Encounter (Signed)
Please see lab report and comments.

## 2017-01-15 ENCOUNTER — Encounter: Payer: Self-pay | Admitting: Nurse Practitioner

## 2017-01-15 NOTE — Progress Notes (Signed)
Subjective:  Presents for recheck on HTN. Occasional mild chest brief chest discomfort lasting less than 5 minutes. Describes as a "cramping" sensation. Unassociated with activity. Has noticed increased dyspnea on exertion, more than usual. Some edema of LE but no more than usual. History of recurrent hypokalemia. Taking 3 potassium pills per day. Takes Xanax at bedtime for sleep. No unusual cough or orthopnea. Non smoker. Has noticed increased GERD lately. Does not take Zantac on a regular basis. No abdominal pain. No N/V.     Objective:   BP 136/82   Temp 98.2 F (36.8 C)   Ht  (1.575 m)   Wt 217 lb (98.4 kg)   BMI 39.69 kg/m  NAD. Alert, oriented. Lungs clear. Heart RRR. No murmur or gallop noted. Carotids no bruits or thrills. EKG normal. Lower extremities 1+ pitting edema. Abdomen soft, obese, non distended, non tender.  Reviewed labs with patient from 01/06/17.   Assessment:   Problem List Items Addressed This Visit      Cardiovascular and Mediastinum   Hypertension - Primary   Relevant Medications   amLODipine (NORVASC) 5 MG tablet   indapamide (LOZOL) 2.5 MG tablet     Digestive   GERD (gastroesophageal reflux disease)     Other   Anxiety   Relevant Medications   ALPRAZolam (XANAX) 0.5 MG tablet   Chest pain   Hypokalemia    Other Visit Diagnoses    Dyspnea on exertion           Plan:   Meds ordered this encounter  Medications  . ALPRAZolam (XANAX) 0.5 MG tablet    Sig: Take 1 tablet (0.5 mg total) by mouth at bedtime as needed for anxiety.    Dispense:  30 tablet    Refill:  2    [facility]    Order Specific Question:   Supervising Provider    Answer:   Merlyn Albert [[id number]]  . amLODipine (NORVASC) 5 MG tablet    Sig: TAKE 1 TABLET(5 MG) BY MOUTH DAILY    Dispense:  30 tablet    Refill:  5    Order Specific Question:   Supervising Provider    Answer:   Merlyn Albert [[id number]]  . indapamide (LOZOL) 2.5 MG tablet    Sig: TAKE 1/2 TO 1  TABLET BY MOUTH EVERY DAY FOR SWELLING    Dispense:  30 tablet    Refill:  5    Order Specific Question:   Supervising Provider    Answer:   Merlyn Albert [[id number]]  . potassium chloride SA (K-DUR,KLOR-CON) 20 MEQ tablet    Sig: Take 3 tablets (60 mEq total) by mouth daily.    Dispense:  90 tablet    Refill:  5    Order Specific Question:   Supervising Provider    Answer:   Riccardo Dubin   Will refer to cardiology for further evaluation. Recommend low dose EC ASA 81 mg qd. Discussed importance of weight loss. Restart Zantac daily for the next few weeks. Warning signs reviewed. Call back or go to ED sooner if any problems.  Return in about 6 months (around 07/16/2017) for recheck.

## 2017-01-18 ENCOUNTER — Encounter: Payer: Self-pay | Admitting: Family Medicine

## 2017-03-15 ENCOUNTER — Other Ambulatory Visit: Payer: Self-pay | Admitting: *Deleted

## 2017-03-15 ENCOUNTER — Telehealth: Payer: Self-pay | Admitting: Family Medicine

## 2017-03-15 MED ORDER — AMOXICILLIN-POT CLAVULANATE 875-125 MG PO TABS: 1.0000 | ORAL_TABLET | Freq: Two times a day (BID) | ORAL | 0 refills | Status: DC

## 2017-03-15 NOTE — Telephone Encounter (Signed)
Patient is experiencing cough, scratchy throat and colored phlegm that started over the weekend.  She is dealing with her husbands medical issues at the moment so she is unable to come in the office.  She is wanting to know if we can send in Rx for antibiotic.  [facility]

## 2017-03-15 NOTE — Telephone Encounter (Signed)
Aug 875 bid ten d 

## 2017-03-15 NOTE — Telephone Encounter (Signed)
Pt notified on voicemail that rx sent to pharm.

## 2017-03-15 NOTE — Telephone Encounter (Signed)
No sob, no fever,

## 2017-03-30 ENCOUNTER — Other Ambulatory Visit: Payer: Self-pay | Admitting: Nurse Practitioner

## 2017-05-06 ENCOUNTER — Encounter: Payer: Self-pay | Admitting: Nurse Practitioner

## 2017-05-06 ENCOUNTER — Ambulatory Visit (INDEPENDENT_AMBULATORY_CARE_PROVIDER_SITE_OTHER): Payer: BLUE CROSS/BLUE SHIELD | Admitting: Nurse Practitioner

## 2017-05-06 VITALS — BP 162/88 | Ht 62.0 in | Wt 217.0 lb

## 2017-05-06 DIAGNOSIS — S92504A Nondisplaced unspecified fracture of right lesser toe(s), initial encounter for closed fracture: Secondary | ICD-10-CM | POA: Diagnosis not present

## 2017-05-06 MED ORDER — MELOXICAM 15 MG PO TABS: 15.0000 mg | ORAL_TABLET | Freq: Every day | ORAL | 0 refills | Status: DC

## 2017-05-06 NOTE — Progress Notes (Signed)
Subjective:  Presents for c/o possible fracture of right great toe after accidentally hitting it on a wooden box yesterday. Compliant with BP meds.   Objective:   BP (!) 162/88   Ht 5\' 2"  (1.575 m)   Wt 217 lb 0.2 oz (98.4 kg)   BMI 39.69 kg/m  NAD. Alert, oriented. Faint edema and ecchymosis noted small toe right foot. Tender to palpation. No pain or edema in the foot.   Assessment:  Closed nondisplaced fracture of phalanx of lesser toe of right foot, unspecified phalanx, initial encounter     Plan:   Meds ordered this encounter  Medications  . meloxicam (MOBIC) 15 MG tablet    Sig: Take 1 tablet (15 mg total) by mouth daily. Prn foot pain    Dispense:  30 tablet    Refill:  0    Order Specific Question:   Supervising Provider    Answer:   Merlyn AlbertLUKING, WILLIAM S [[id number]]   Buddy tape to next toe. Take Mobic with food. Stop if any reflux. Monitor BP outside of the office and send results. Discussed lifestyle factors affecting BP.  Return if symptoms worsen or fail to improve.

## 2017-05-06 NOTE — Patient Instructions (Signed)
Toe Fracture A toe fracture is a break in one of the toe bones (phalanges). What are the causes? This condition may be caused by:  Dropping a heavy object on your toe.  Stubbing your toe.  Overusing your toe or doing repetitive exercise.  Twisting or stretching your toe out of place.  What increases the risk? This condition is more likely to develop in people who:  Play contact sports.  Have a bone disease.  Have a low calcium level.  What are the signs or symptoms? The main symptoms of this condition are swelling and pain in the toe. The pain may get worse with standing or walking. Other symptoms include:  Bruising.  Stiffness.  Numbness.  A change in the way the toe looks.  Broken bones that poke through the skin.  Blood beneath the toenail.  How is this diagnosed? This condition is diagnosed with a physical exam. You may also have X-rays. How is this treated? Treatment for this condition depends on the type of fracture and its severity. Treatment may involve:  Taping the broken toe to a toe that is next to it (buddy taping). This is the most common treatment for fractures in which the bone has not moved out of place (nondisplaced fracture).  Wearing a shoe that has a wide, rigid sole to protect the toe and to limit its movement.  Wearing a walking cast.  Having a procedure to move the toe back into place.  Surgery. This may be needed: ? If there are many pieces of broken bone that are out of place (displaced). ? If the toe joint breaks. ? If the bone breaks through the skin.  Physical therapy. This is done to help regain movement and strength in the toe.  You may need follow-up X-rays to make sure that the bone is healing well and staying in position. Follow these instructions at home: If you have a cast:  Do not stick anything inside the cast to scratch your skin. Doing that increases your risk of infection.  Check the skin around the cast every day.  Report any concerns to your health care provider. You may put lotion on dry skin around the edges of the cast. Do not apply lotion to the skin underneath the cast.  Do not put pressure on any part of the cast until it is fully hardened. This may take several hours.  Keep the cast clean and dry. Bathing  Do not take baths, swim, or use a hot tub until your health care provider approves. Ask your health care provider if you can take showers. You may only be allowed to take sponge baths for bathing.  If your health care provider approves bathing and showering, cover the cast or bandage (dressing) with a watertight plastic bag to protect it from water. Do not let the cast or dressing get wet. Managing pain, stiffness, and swelling  If you do not have a cast, apply ice to the injured area, if directed. ? Put ice in a plastic bag. ? Place a towel between your skin and the bag. ? Leave the ice on for 20 minutes, 2-3 times per day.  Move your toes often to avoid stiffness and to lessen swelling.  Raise (elevate) the injured area above the level of your heart while you are sitting or lying down. Driving  Do not drive or operate heavy machinery while taking pain medicine.  Do not drive while wearing a cast on a foot that you   use for driving. Activity  Return to your normal activities as directed by your health care provider. Ask your health care provider what activities are safe for you.  Perform exercises daily as directed by your health care provider or physical therapist. Safety  Do not use the injured limb to support your body weight until your health care provider says that you can. Use crutches or other assistive devices as directed by your health care provider. General instructions  If your toe was treated with buddy taping, follow your health care provider's instructions for changing the gauze and tape. Change it more often: ? The gauze and tape get wet. If this happens, dry the  space between the toes. ? The gauze and tape are too tight and cause your toe to become pale or numb.  Wear a protective shoe as directed by your health care provider. If you were not given a protective shoe, wear sturdy, supportive shoes. Your shoes should not pinch your toes and should not fit tightly against your toes.  Do not use any tobacco products, including cigarettes, chewing tobacco, or e-cigarettes. Tobacco can delay bone healing. If you need help quitting, ask your health care provider.  Take medicines only as directed by your health care provider.  Keep all follow-up visits as directed by your health care provider. This is important. Contact a health care provider if:  You have a fever.  Your pain medicine is not helping.  Your toe is cold.  Your toe is numb.  You still have pain after one week of rest and treatment.  You still have pain after your health care provider has said that you can start walking again.  You have pain, tingling, or numbness in your foot that is not going away. Get help right away if:  You have severe pain.  You have redness or inflammation in your toe that is getting worse.  You have pain or numbness in your toe that is getting worse.  Your toe turns blue. This information is not intended to replace advice given to you by your health care provider. Make sure you discuss any questions you have with your health care provider. Document Released: 09/11/2000 Document Revised: 05/18/2016 Document Reviewed: 07/11/2014 Elsevier Interactive Patient Education  2018 [employer]  

## 2017-06-28 ENCOUNTER — Other Ambulatory Visit: Payer: Self-pay | Admitting: Family Medicine

## 2017-06-28 DIAGNOSIS — Z1231 Encounter for screening mammogram for malignant neoplasm of breast: Secondary | ICD-10-CM

## 2017-07-07 ENCOUNTER — Ambulatory Visit: Payer: BLUE CROSS/BLUE SHIELD | Admitting: Nurse Practitioner

## 2017-07-12 ENCOUNTER — Encounter: Payer: Self-pay | Admitting: Nurse Practitioner

## 2017-07-12 ENCOUNTER — Ambulatory Visit (INDEPENDENT_AMBULATORY_CARE_PROVIDER_SITE_OTHER): Payer: BLUE CROSS/BLUE SHIELD | Admitting: Nurse Practitioner

## 2017-07-12 VITALS — BP 132/90 | Ht 62.0 in | Wt 215.0 lb

## 2017-07-12 DIAGNOSIS — K219 Gastro-esophageal reflux disease without esophagitis: Secondary | ICD-10-CM

## 2017-07-12 DIAGNOSIS — F419 Anxiety disorder, unspecified: Secondary | ICD-10-CM | POA: Diagnosis not present

## 2017-07-12 DIAGNOSIS — E876 Hypokalemia: Secondary | ICD-10-CM | POA: Diagnosis not present

## 2017-07-12 DIAGNOSIS — I10 Essential (primary) hypertension: Secondary | ICD-10-CM | POA: Diagnosis not present

## 2017-07-12 MED ORDER — RANITIDINE HCL 150 MG PO CAPS: 150.0000 mg | ORAL_CAPSULE | Freq: Two times a day (BID) | ORAL | 5 refills | Status: DC

## 2017-07-12 MED ORDER — ALPRAZOLAM 0.5 MG PO TABS: 0.5000 mg | ORAL_TABLET | Freq: Every evening | ORAL | 2 refills | Status: DC | PRN

## 2017-07-12 NOTE — Progress Notes (Signed)
Subjective:  Presents for routine follow-up on her blood pressure. 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 This has greatly helped her anxiety level. Has not taken her blood pressure pill this morning. No chest pain/ischemic type pain or shortness of breath. No TIA symptoms. Anxiety stable on occasional Xanax. Has a history of hypokalemia, taking daily potassium as directed. Taking Zantac 300 mg at nighttime, would like to try to separate this into 2 doses to help more during the day.  Objective:   BP 132/90 Comment: has not had BP med this am  Ht  (1.575 m)   Wt 215 lb (97.5 kg)   BMI 39.32 kg/m  NAD. Alert, oriented. Lungs clear. Heart regular rate rhythm. Abdomen soft nondistended nontender. Lower extremities trace-1+ pitting edema. Thoughts logical coherent and relevant. Much calmer affect than previous visit.  Assessment:   Problem List Items Addressed This Visit      Cardiovascular and Mediastinum   Hypertension - Primary     Digestive   GERD (gastroesophageal reflux disease)   Relevant Medications   ranitidine (ZANTAC) 150 MG capsule     Other   Anxiety   Relevant Medications   ALPRAZolam (XANAX) 0.5 MG tablet   Hypokalemia   Relevant Orders   Potassium       Plan:   Meds ordered this encounter  Medications  . ALPRAZolam (XANAX) 0.5 MG tablet    Sig: Take 1 tablet (0.5 mg total) by mouth at bedtime as needed for anxiety.    Dispense:  30 tablet    Refill:  2    [facility]    Order Specific Question:   Supervising Provider    Answer:   Merlyn Albert [[id number]]  . ranitidine (ZANTAC) 150 MG capsule    Sig: Take 1 capsule (150 mg total) by mouth 2 (two) times daily.    Dispense:  60 capsule    Refill:  5    Order Specific Question:   Supervising Provider    Answer:   Merlyn Albert [[id number]]   Continue current medications as directed. Switch to Zantac 150 mg twice a day dosing.  Reminded patient about preventive health physical. Has her mammogram scheduled for 10/19. Otherwise routine follow-up in 6 months. Routine labs due at that time.

## 2017-07-13 ENCOUNTER — Telehealth: Payer: Self-pay | Admitting: Family Medicine

## 2017-07-13 LAB — POTASSIUM: Potassium: 4 mmol/L (ref 3.5–5.2)

## 2017-07-13 NOTE — Telephone Encounter (Signed)
Normal at four, if pt has questions re this send to carolyn

## 2017-07-13 NOTE — Telephone Encounter (Signed)
See labs. Jessica Kirby answered today. And shannon notiifed. Pt.

## 2017-07-13 NOTE — Telephone Encounter (Signed)
Requesting results to potassium labs.

## 2017-07-16 ENCOUNTER — Ambulatory Visit (HOSPITAL_COMMUNITY)
Admission: RE | Admit: 2017-07-16 | Discharge: 2017-07-16 | Disposition: A | Discharge status: Home / Self Care | Payer: BLUE CROSS/BLUE SHIELD | Source: Ambulatory Visit | Attending: Family Medicine | Admitting: Family Medicine

## 2017-07-16 DIAGNOSIS — Z1231 Encounter for screening mammogram for malignant neoplasm of breast: Secondary | ICD-10-CM | POA: Insufficient documentation

## 2017-08-13 ENCOUNTER — Other Ambulatory Visit: Payer: Self-pay | Admitting: Nurse Practitioner

## 2017-08-16 ENCOUNTER — Ambulatory Visit (INDEPENDENT_AMBULATORY_CARE_PROVIDER_SITE_OTHER): Payer: BLUE CROSS/BLUE SHIELD | Admitting: Nurse Practitioner

## 2017-08-16 ENCOUNTER — Encounter: Payer: Self-pay | Admitting: Nurse Practitioner

## 2017-08-16 VITALS — BP 154/88 | Ht 62.0 in | Wt 218.0 lb

## 2017-08-16 DIAGNOSIS — Z0000 Encounter for general adult medical examination without abnormal findings: Secondary | ICD-10-CM

## 2017-08-19 ENCOUNTER — Encounter: Payer: Self-pay | Admitting: Nurse Practitioner

## 2017-08-19 NOTE — Progress Notes (Addendum)
   Subjective:    Patient ID: Jessica Kirby, female    DOB: Oct 10, 1955, 61 y.o.   MRN: [id number]  HPI presents for her wellness exam.  Has had a total hysterectomy.  Same sexual partner.  Regular vision and dental exams.  No regular activity.  Compliant with blood pressure medications.    Review of Systems  Constitutional: Negative for activity change, appetite change and fatigue.  HENT: Negative for dental problem, ear pain, sinus pressure and sore throat.   Respiratory: Negative for cough, chest tightness, shortness of breath and wheezing.   Cardiovascular: Negative for chest pain and leg swelling.  Gastrointestinal: Negative for abdominal distention, abdominal pain, blood in stool, constipation, diarrhea, nausea and vomiting.  Genitourinary: Negative for difficulty urinating, dysuria, enuresis, frequency, genital sores, pelvic pain, urgency and vaginal discharge.   Depression screen [facility] 2/9 08/16/2017  Decreased Interest 0  Down, Depressed, Hopeless 0  PHQ - 2 Score 0  Altered sleeping 0  Tired, decreased energy 1  Change in appetite 1  Feeling bad or failure about yourself  0  Trouble concentrating 0  Moving slowly or fidgety/restless 0  Suicidal thoughts 0  PHQ-9 Score 2  Difficult doing work/chores Not difficult at all        Objective:   Physical Exam  Constitutional: She is oriented to person, place, and time. She appears well-developed. No distress.  HENT:  Right Ear: External ear normal.  Left Ear: External ear normal.  Mouth/Throat: Oropharynx is clear and moist.  Neck: Normal range of motion. Neck supple. No tracheal deviation present. No thyromegaly present.  Cardiovascular: Normal rate, regular rhythm and normal heart sounds. Exam reveals no gallop.  No murmur heard. Pulmonary/Chest: Effort normal and breath sounds normal. Right breast exhibits no inverted nipple, no mass, no skin change and no tenderness. Left breast exhibits no inverted nipple, no mass, no  skin change and no tenderness. Breasts are symmetrical.  Axillae no adenopathy.  Abdominal: Soft. She exhibits no distension. There is no tenderness.  Genitourinary: Vagina normal. No vaginal discharge found.  Genitourinary Comments: External GU: No rashes or lesions.  Vagina no discharge.  Bimanual exam no tenderness or masses.  Musculoskeletal: She exhibits no edema.  Lymphadenopathy:    She has no cervical adenopathy.  Neurological: She is alert and oriented to person, place, and time.  Skin: Skin is warm and dry. No rash noted.  Psychiatric: She has a normal mood and affect. Her behavior is normal.  Vitals reviewed.         Assessment & Plan:  Annual physical exam  Encouraged regular activity healthy diet and weight loss.  Recommend daily vitamin D and calcium supplementation.  Monitor BP outside of the office and call back if it remains above 140/90. Return in about 6 months (around 02/13/2018) for routine follow up with labs.

## 2017-08-23 ENCOUNTER — Other Ambulatory Visit: Payer: Self-pay | Admitting: Nurse Practitioner

## 2017-09-22 ENCOUNTER — Emergency Department (HOSPITAL_COMMUNITY)
Admission: EM | Admit: 2017-09-22 | Discharge: 2017-09-22 | Disposition: A | Discharge status: Home / Self Care | Payer: BLUE CROSS/BLUE SHIELD | Attending: Emergency Medicine | Admitting: Emergency Medicine

## 2017-09-22 ENCOUNTER — Other Ambulatory Visit: Payer: Self-pay

## 2017-09-22 ENCOUNTER — Encounter (HOSPITAL_COMMUNITY): Payer: Self-pay

## 2017-09-22 DIAGNOSIS — Z79899 Other long term (current) drug therapy: Secondary | ICD-10-CM | POA: Insufficient documentation

## 2017-09-22 DIAGNOSIS — E039 Hypothyroidism, unspecified: Secondary | ICD-10-CM | POA: Diagnosis not present

## 2017-09-22 DIAGNOSIS — I10 Essential (primary) hypertension: Secondary | ICD-10-CM | POA: Diagnosis not present

## 2017-09-22 DIAGNOSIS — Z7982 Long term (current) use of aspirin: Secondary | ICD-10-CM | POA: Diagnosis not present

## 2017-09-22 DIAGNOSIS — Z7722 Contact with and (suspected) exposure to environmental tobacco smoke (acute) (chronic): Secondary | ICD-10-CM | POA: Insufficient documentation

## 2017-09-22 MED ORDER — ACETAMINOPHEN 500 MG PO TABS
1000.0000 mg | ORAL_TABLET | Freq: Once | ORAL | Status: AC
Start: 2017-09-22 — End: 2017-09-22
  Administered 2017-09-22: 1000 mg via ORAL
  Filled 2017-09-22: qty 2

## 2017-09-22 MED ORDER — AMLODIPINE BESYLATE 10 MG PO TABS: 10.0000 mg | ORAL_TABLET | Freq: Every day | ORAL | 0 refills | Status: DC

## 2017-09-22 NOTE — ED Provider Notes (Signed)
[facility]Y [facility]PARTMENT Provider Note   CSN: [id number] Arrival date & time: 09/22/17  2139     History   Chief Complaint Chief Complaint  Patient presents with  . Hypertension    HPI Jessica Kirby is a 61 y.o. female.  Patient presents with headache and elevated blood pressure.  Her blood pressure was 149/90 at home.  Here in the emergency department, BP was 165/85.  On second check it was 146/73.  She took nothing for headache.  Her blood pressure has been running on the high side and her doctor discussed increasing her blood pressure medicine which currently is Norvasc 5 mg daily.  No neurological deficits, chest pain, dyspnea, stiff neck.      Past Medical History:  Diagnosis Date  . Allergy   . Anxiety    Insomnia  . Depression    Insomnia  . GERD (gastroesophageal reflux disease)   . Herniated disc   . Hypertension   . Hypothyroidism    not medicated at this time  . Osteoporosis   . Thyroid disease    Hypothyroidism    Patient Active Problem List   Diagnosis Date Noted  . Diverticulosis of colon without hemorrhage   . Anxiety 11/04/2015  . Primary osteoarthritis of both hands 11/04/2015  . Chest pain 07/20/2015  . Hypokalemia 07/20/2015  . Pain in the chest   . Esophageal reflux   . Morbid obesity due to excess calories (HCC)   . Persistent cough 07/04/2015  . GERD (gastroesophageal reflux disease) 09/27/2013  . Hypothyroidism 12/24/2012  . 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 Hearing decreased 12/24/2012  . Depression with anxiety 12/24/2012  . Hypertension   . Osteopenia     Past Surgical History:  Procedure Laterality Date  . ABDOMINAL HYSTERECTOMY    . COLONOSCOPY N/A 04/07/2016   Procedure: COLONOSCOPY;  Surgeon: Corbin Adeobert M Rourk, MD;  Location: [facility];  Service: Endoscopy;  Laterality: N/A;  8:30 AM  . TUBAL LIGATION    . WRIST SURGERY      OB History    No data available       Home Medications    Prior to  Admission medications   Medication Sig Start Date End Date Taking? Authorizing Provider  ALPRAZolam Prudy Feeler(XANAX) 0.5 MG tablet Take 1 tablet (0.5 mg total) by mouth at bedtime as needed for anxiety. 07/12/17  Yes Campbell RichesHoskins, [name] C, NP  aspirin EC 81 MG tablet Take 81 mg by mouth 2 (two) times a week.   Yes [provider]  indapamide (LOZOL) 2.5 MG tablet TAKE 1/2 TO 1 TABLET BY MOUTH EVERY DAY FOR SWELLING 01/14/17  Yes Campbell RichesHoskins, [name] C, NP  potassium chloride SA (K-DUR,KLOR-CON) 20 MEQ tablet TAKE 3 TABLETS(60 MEQ) BY MOUTH DAILY 08/23/17  Yes Campbell RichesHoskins, [name] C, NP  ranitidine (ZANTAC) 150 MG capsule Take 1 capsule (150 mg total) by mouth 2 (two) times daily. Patient taking differently: Take 150 mg by mouth daily as needed for heartburn.  07/12/17  Yes Campbell RichesHoskins, [name] C, NP  triamcinolone (NASACORT ALLERGY 24HR) 55 MCG/ACT AERO nasal inhaler Place 2 sprays into the nose every morning.   Yes [provider]  amLODipine (NORVASC) 10 MG tablet Take 1 tablet (10 mg total) by mouth daily. 09/22/17   Donnetta Hutchingook, Ryin Schillo, MD    Family History Family History  Problem Relation Age of Onset  . Diabetes Father   . Colon cancer Neg Hx     Social History Social History  Tobacco Use  . Smoking status: Passive Smoke Exposure - Never Smoker  . Smokeless tobacco: Never Used  Substance Use Topics  . Alcohol use: No    Alcohol/week: 0.0 oz  . Drug use: No     Allergies   Fosamax [alendronate sodium]; Vioxx [rofecoxib]; Losartan; and Sulfa antibiotics   Review of Systems Review of Systems  All other systems reviewed and are negative.    Physical Exam Updated Vital Signs BP 118/74   Pulse 73   Temp 98 F (36.7 C) (Oral)   Resp 15   Ht 5\' 2"  (1.575 m)   Wt 98.9 kg (218 lb)   SpO2 95%   BMI 39.87 kg/m   Physical Exam  Constitutional: She is oriented to person, place, and time. She appears well-developed and well-nourished.  HENT:  Head: Normocephalic and atraumatic.    Eyes: Conjunctivae are normal.  Neck: Neck supple.  Cardiovascular: Normal rate and regular rhythm.  Pulmonary/Chest: Effort normal and breath sounds normal.  Abdominal: Soft. Bowel sounds are normal.  Musculoskeletal: Normal range of motion.  Neurological: She is alert and oriented to person, place, and time.  Skin: Skin is warm and dry.  Psychiatric: She has a normal mood and affect. Her behavior is normal.  Nursing note and vitals reviewed.    ED Treatments / Results  Labs (all labs ordered are listed, but only abnormal results are displayed) Labs Reviewed - No data to display  EKG  EKG Interpretation None       Radiology No results found.  Procedures Procedures (including critical care time)  Medications Ordered in ED Medications  acetaminophen (TYLENOL) tablet 1,000 mg (1,000 mg Oral Given 09/22/17 2302)     Initial Impression / Assessment and Plan / ED Course  I have reviewed the triage vital signs and the nursing notes.  Pertinent labs & imaging results that were available during my care of the patient were reviewed by me and considered in my medical decision making (see chart for details).     Patient is hemodynamically stable.  Will increase blood pressure medicine to Norvasc 10 mg daily.  Patient has primary care follow-up.  Final Clinical Impressions(s) / ED Diagnoses   Final diagnoses:  Hypertension, unspecified type    ED Discharge Orders        Ordered    amLODipine (NORVASC) 10 MG tablet  Daily     09/22/17 2303       Donnetta Hutchingook, Oscar Forman, MD 09/22/17 2308

## 2017-09-22 NOTE — ED Triage Notes (Signed)
Patient states that her blood pressure is up and she has a headache.  Started today.

## 2017-09-22 NOTE — Discharge Instructions (Signed)
Increase Norvasc to 10 mg daily.  Prescription given.  Tylenol and/or ibuprofen for headache.  Follow-up with your primary care doctor.

## 2017-09-23 ENCOUNTER — Other Ambulatory Visit: Payer: Self-pay | Admitting: Nurse Practitioner

## 2017-10-21 ENCOUNTER — Ambulatory Visit (INDEPENDENT_AMBULATORY_CARE_PROVIDER_SITE_OTHER): Payer: BLUE CROSS/BLUE SHIELD | Admitting: Family Medicine

## 2017-10-21 ENCOUNTER — Encounter: Payer: Self-pay | Admitting: Family Medicine

## 2017-10-21 VITALS — BP 132/82 | Temp 98.5°F | Ht 62.0 in | Wt 227.8 lb

## 2017-10-21 DIAGNOSIS — J019 Acute sinusitis, unspecified: Secondary | ICD-10-CM

## 2017-10-21 DIAGNOSIS — B9689 Other specified bacterial agents as the cause of diseases classified elsewhere: Secondary | ICD-10-CM

## 2017-10-21 MED ORDER — AMOXICILLIN-POT CLAVULANATE 875-125 MG PO TABS: 1.0000 | ORAL_TABLET | Freq: Two times a day (BID) | ORAL | 0 refills | Status: DC

## 2017-10-21 MED ORDER — HYDROCODONE-HOMATROPINE 5-1.5 MG/5ML PO SYRP: 5.0000 mL | ORAL_SOLUTION | Freq: Four times a day (QID) | ORAL | 0 refills | Status: DC | PRN

## 2017-10-21 NOTE — Progress Notes (Signed)
   Subjective:    Patient ID: Jessica Kirby, female    DOB: [birth year], 62 y.o.   MRN: [id number]  Sinus Problem  This is a new problem. The current episode started in the past 7 days. Associated symptoms include congestion, coughing and headaches. Pertinent negatives include no ear pain or shortness of breath. Treatments tried: otc meds.   Viral-like illness several days now with sinus pressure pain discomfort denies high fever chills wheezing difficulty breathing   Review of Systems  Constitutional: Negative for activity change and fever.  HENT: Positive for congestion and rhinorrhea. Negative for ear pain.   Eyes: Negative for discharge.  Respiratory: Positive for cough. Negative for shortness of breath and wheezing.   Cardiovascular: Negative for chest pain.  Neurological: Positive for headaches.       Objective:   Physical Exam  Constitutional: She appears well-developed.  HENT:  Head: Normocephalic.  Right Ear: External ear normal.  Left Ear: External ear normal.  Nose: Nose normal.  Mouth/Throat: Oropharynx is clear and moist. No oropharyngeal exudate.  Eyes: Right eye exhibits no discharge. Left eye exhibits no discharge.  Neck: Neck supple. No tracheal deviation present.  Cardiovascular: Normal rate and normal heart sounds.  No murmur heard. Pulmonary/Chest: Effort normal and breath sounds normal. She has no wheezes. She has no rales.  Lymphadenopathy:    She has no cervical adenopathy.  Skin: Skin is warm and dry.  Nursing note and vitals reviewed.         Assessment & Plan:  Viral syndrome Patient was seen today for upper respiratory illness. It is felt that the patient is dealing with sinusitis. Antibiotics were prescribed today. Importance of compliance with medication was discussed. Symptoms should gradually resolve over the course of the next several days. If high fevers, progressive illness, difficulty breathing, worsening condition or failure for  symptoms to improve over the next several days then the patient is to follow-up. If any emergent conditions the patient is to follow-up in the emergency department otherwise to follow-up in the office. Antibiotics prescribed warnings discussed

## 2017-10-25 ENCOUNTER — Encounter: Payer: Self-pay | Admitting: Nurse Practitioner

## 2017-10-25 ENCOUNTER — Ambulatory Visit: Payer: BLUE CROSS/BLUE SHIELD | Admitting: Nurse Practitioner

## 2017-10-25 VITALS — BP 130/84 | Ht 62.0 in | Wt 225.2 lb

## 2017-10-25 DIAGNOSIS — M5442 Lumbago with sciatica, left side: Secondary | ICD-10-CM

## 2017-10-25 DIAGNOSIS — I10 Essential (primary) hypertension: Secondary | ICD-10-CM

## 2017-10-25 MED ORDER — GABAPENTIN 300 MG PO CAPS: ORAL_CAPSULE | ORAL | 2 refills | Status: DC

## 2017-10-28 ENCOUNTER — Encounter: Payer: Self-pay | Admitting: Nurse Practitioner

## 2017-10-28 NOTE — Progress Notes (Signed)
Subjective: Presents for complaints of left-sided low back pain which radiates down the leg occasionally to the foot.  Has having a great deal of difficulty going up and down steps.  Has to manually lift her leg at times to use stairs.  Pain radiates down her left low back area to the buttock front of the leg down to the toes.  Her last MRI was 2012.  Has had injections in her back about 6-7 years ago.  No change in her bowel or bladder habits.  Progressive worsening back pain over the past few weeks.  Describes pain as 8-9/10.  Slight relief with Aleve.  Also was seen by local ED on 09/22/17.  Her amlodipine was increased to 10 mg at that time.  This triggered heartburn as well as slight dizziness.  Patient went back to 5 mg daily, side effects have resolved.  Blood pressure is much improved.  Continues to be under tremendous amounts of stress.  Objective:   BP 130/84   Ht 5\' 2"  (1.575 m)   Wt 225 lb 3.2 oz (102.2 kg)   BMI 41.19 kg/m  NAD.  Alert, oriented.  Lungs clear.  Heart regular rate and rhythm.  Carotids no bruits or thrills.  Lower extremities no edema.  Distinct left lumbar tenderness noted on exam.  Radiates into the left buttock.  SLR positive on the left negative on the right.  Reflexes normal limit lower extremities.  Has a great deal of difficulty getting onto the table, physically has to raise her left leg. Several abnormalities noted on her previous MRI C results dated 08/10/2011.  Assessment:   Problem List Items Addressed This Visit      Cardiovascular and Mediastinum   Hypertension    Other Visit Diagnoses    Acute left-sided low back pain with left-sided sciatica    -  Primary       Plan:   Meds ordered this encounter  Medications  . gabapentin (NEURONTIN) 300 MG capsule    Sig: Take one cap at night x 3 nights then one po BID    Dispense:  60 capsule    Refill:  2    Order Specific Question:   Supervising Provider    Answer:   Merlyn AlbertLUKING, WILLIAM S [[id number]]   Start  gabapentin as directed.  Patient understands this is an initial measure to allow us time to either get a referral or another MRI.  Warning signs reviewed.  Call back in the meantime if worsening or new symptoms develop.  Continue amlodipine 5 mg for now.  Will send in referral to see if MRI is required.  25 minutes was spent with the patient.  This statement verifies that 25 minutes was indeed spent with the patient. Greater than half the time was spent in discussion, counseling and answering questions  regarding the issues that the patient came in for today as reflected in the diagnosis (s) please refer to documentation for further details.

## 2017-10-29 ENCOUNTER — Telehealth: Payer: Self-pay | Admitting: Nurse Practitioner

## 2017-10-29 DIAGNOSIS — G8911 Acute pain due to trauma: Secondary | ICD-10-CM

## 2017-10-29 NOTE — Telephone Encounter (Signed)
Pt called stating that she would like to find out if the MRI was going to be scheduled that Eber JonesCarolyn had mentioned. The pt was seen on 10/25/2017.

## 2017-10-29 NOTE — Telephone Encounter (Signed)
Please advise 

## 2017-11-03 NOTE — Telephone Encounter (Signed)
Order placed

## 2017-11-03 NOTE — Telephone Encounter (Signed)
Spoke with Eber Jonesarolyn about this, pt will need new MRI for neurosurgery referral  Eber JonesCarolyn asked that I send message to clinical pool for nurse to put in order for MRI L-spine without contrast

## 2017-11-04 ENCOUNTER — Telehealth: Payer: Self-pay | Admitting: Family Medicine

## 2017-11-04 NOTE — Telephone Encounter (Signed)
Need to schedule MRI L-spine without contrast  [facility] day/time preference for MRI

## 2017-11-05 NOTE — Telephone Encounter (Signed)
Pt called requesting to cancel MRI order & neurosurgery referral Her husband is really sick at the point and she will call us when she's ready to proceed

## 2018-01-24 ENCOUNTER — Ambulatory Visit: Payer: BLUE CROSS/BLUE SHIELD | Admitting: Nurse Practitioner

## 2018-01-27 ENCOUNTER — Ambulatory Visit: Payer: BLUE CROSS/BLUE SHIELD | Admitting: Nurse Practitioner

## 2018-01-27 ENCOUNTER — Encounter: Payer: Self-pay | Admitting: Nurse Practitioner

## 2018-01-27 VITALS — BP 118/76 | Ht 62.0 in | Wt 212.0 lb

## 2018-01-27 DIAGNOSIS — F418 Other specified anxiety disorders: Secondary | ICD-10-CM

## 2018-01-27 DIAGNOSIS — E876 Hypokalemia: Secondary | ICD-10-CM | POA: Diagnosis not present

## 2018-01-27 DIAGNOSIS — I10 Essential (primary) hypertension: Secondary | ICD-10-CM

## 2018-01-27 DIAGNOSIS — E039 Hypothyroidism, unspecified: Secondary | ICD-10-CM | POA: Diagnosis not present

## 2018-01-27 MED ORDER — ALPRAZOLAM 0.5 MG PO TABS: 0.5000 mg | ORAL_TABLET | Freq: Every evening | ORAL | 2 refills | Status: DC | PRN

## 2018-01-27 MED ORDER — AMLODIPINE BESYLATE 5 MG PO TABS: 5.0000 mg | ORAL_TABLET | Freq: Every day | ORAL | 5 refills | Status: DC

## 2018-01-27 MED ORDER — INDAPAMIDE 2.5 MG PO TABS: ORAL_TABLET | ORAL | 5 refills | Status: DC

## 2018-01-27 MED ORDER — POTASSIUM CHLORIDE CRYS ER 20 MEQ PO TBCR: EXTENDED_RELEASE_TABLET | ORAL | 5 refills | Status: DC

## 2018-01-28 ENCOUNTER — Encounter: Payer: Self-pay | Admitting: Nurse Practitioner

## 2018-01-28 NOTE — Progress Notes (Signed)
Subjective: Presents for routine follow-up of her hypertension.  Recently lost her husband.  Has retired from her job which is greatly improved her stress level.  Currently on Norvasc 5 mg.  Reflux has been stable, no abdominal pain or nausea and vomiting.  Take Zantac occasionally.  Has a history of recurrent hypokalemia, taking her potassium daily.  Takes Xanax as needed for sleep or anxiety.  Defers need for daily medication at this point. Denies CP/ischemic type pain or SOB. No visual changes. No difficulty speaking or swallowing. No numbness or weakness of the face, arms or legs.    Objective:   BP 118/76   Ht  (1.575 m)   Wt 212 lb (96.2 kg)   BMI 38.78 kg/m  NAD.  Alert, oriented.  Tearful when talking about losing her husband.  Thoughts logical coherent and relevant.  Dressed appropriately.  Making good eye contact.  Lungs clear.  Heart regular rate and rhythm.  Carotids no bruits or thrills.  Lower extremities no edema.  Abdomen soft nontender.  Assessment:   Problem List Items Addressed This Visit      Cardiovascular and Mediastinum   Hypertension - Primary   Relevant Medications   indapamide (LOZOL) 2.5 MG tablet   amLODipine (NORVASC) 5 MG tablet   Other Relevant Orders   Lipid panel   Hepatic function panel   TSH   Basic metabolic panel     Endocrine   Hypothyroidism (Chronic)   Relevant Orders   Lipid panel   Hepatic function panel   TSH   Basic metabolic panel     Other   Depression with anxiety   Relevant Medications   ALPRAZolam (XANAX) 0.5 MG tablet   Other Relevant Orders   Lipid panel   Hepatic function panel   TSH   Basic metabolic panel   Hypokalemia   Relevant Orders   Lipid panel   Hepatic function panel   TSH   Basic metabolic panel       Plan:   Meds ordered this encounter  Medications  . DISCONTD: ALPRAZolam (XANAX) 0.5 MG tablet    Sig: Take 1 tablet (0.5 mg total) by mouth at bedtime as needed for anxiety.    Dispense:  30  tablet    Refill:  2    Order Specific Question:   Supervising Provider    Answer:   Merlyn Albert [[id number]]  . indapamide (LOZOL) 2.5 MG tablet    Sig: Take one po q am    Dispense:  30 tablet    Refill:  5    Order Specific Question:   Supervising Provider    Answer:   Merlyn Albert [[id number]]  . potassium chloride SA (K-DUR,KLOR-CON) 20 MEQ tablet    Sig: TAKE 3 TABLETS(60 MEQ) BY MOUTH DAILY    Dispense:  90 tablet    Refill:  5    Order Specific Question:   Supervising Provider    Answer:   Merlyn Albert [[id number]]  . amLODipine (NORVASC) 5 MG tablet    Sig: Take 1 tablet (5 mg total) by mouth daily.    Dispense:  30 tablet    Refill:  5    Order Specific Question:   Supervising Provider    Answer:   Merlyn Albert [[id number]]  . ALPRAZolam (XANAX) 0.5 MG tablet    Sig: Take 1 tablet (0.5 mg total) by mouth at bedtime as needed for anxiety.    Dispense:  30  tablet    Refill:  2    Order Specific Question:   Supervising Provider    Answer:   Merlyn Albert [[id number]]   Routine labs pending.  Encouraged regular activity healthy diet and continued weight loss efforts.  Contact office if we can be of assistance during her bereavement. Return in about 6 months (around 07/30/2018) for physical and BP check up.

## 2018-02-01 DIAGNOSIS — E876 Hypokalemia: Secondary | ICD-10-CM | POA: Diagnosis not present

## 2018-02-01 DIAGNOSIS — I10 Essential (primary) hypertension: Secondary | ICD-10-CM | POA: Diagnosis not present

## 2018-02-01 DIAGNOSIS — F418 Other specified anxiety disorders: Secondary | ICD-10-CM | POA: Diagnosis not present

## 2018-02-01 DIAGNOSIS — E039 Hypothyroidism, unspecified: Secondary | ICD-10-CM | POA: Diagnosis not present

## 2018-02-02 LAB — HEPATIC FUNCTION PANEL
ALBUMIN: 4.2 g/dL (ref 3.6–4.8)
ALT: 18 IU/L (ref 0–32)
AST: 17 IU/L (ref 0–40)
Alkaline Phosphatase: 76 IU/L (ref 39–117)
BILIRUBIN, DIRECT: 0.11 mg/dL (ref 0.00–0.40)
Bilirubin Total: 0.5 mg/dL (ref 0.0–1.2)
Total Protein: 6.5 g/dL (ref 6.0–8.5)

## 2018-02-02 LAB — BASIC METABOLIC PANEL
BUN/Creatinine Ratio: 10 — ABNORMAL LOW (ref 12–28)
BUN: 8 mg/dL (ref 8–27)
CO2: 26 mmol/L (ref 20–29)
Calcium: 9.3 mg/dL (ref 8.7–10.3)
Chloride: 104 mmol/L (ref 96–106)
Creatinine, Ser: 0.77 mg/dL (ref 0.57–1.00)
GFR calc Af Amer: 95 mL/min/{1.73_m2} (ref >59)
GFR calc non Af Amer: 82 mL/min/{1.73_m2} (ref >59)
Glucose: 100 mg/dL — ABNORMAL HIGH (ref 65–99)
Potassium: 3.7 mmol/L (ref 3.5–5.2)
Sodium: 144 mmol/L (ref 134–144)

## 2018-02-02 LAB — LIPID PANEL
CHOL/HDL RATIO: 4.8 ratio — AB (ref 0.0–4.4)
CHOLESTEROL TOTAL: 203 mg/dL — AB (ref 100–199)
HDL: 42 mg/dL (ref >39)
LDL CALC: 134 mg/dL — AB (ref 0–99)
Triglycerides: 136 mg/dL (ref 0–149)
VLDL Cholesterol Cal: 27 mg/dL (ref 5–40)

## 2018-02-02 LAB — TSH: TSH: 2.76 u[IU]/mL (ref 0.450–4.500)

## 2018-02-03 ENCOUNTER — Telehealth: Payer: Self-pay | Admitting: Family Medicine

## 2018-02-03 NOTE — Telephone Encounter (Signed)
Contacted pt to let her know we have not received results from labs at this time but we will call back as soon as labs resulted

## 2018-02-03 NOTE — Telephone Encounter (Signed)
Requesting results to labwork. °

## 2018-02-04 ENCOUNTER — Telehealth: Payer: Self-pay | Admitting: Family Medicine

## 2018-02-04 NOTE — Telephone Encounter (Signed)
Pt informed that we will call her when lab test have been read by provider;pt informed understanding

## 2018-02-04 NOTE — Telephone Encounter (Signed)
Patient is calling to request results to lab work.  She says she is concerned that she has not heard anything back yet.

## 2018-02-08 NOTE — Telephone Encounter (Signed)
See lab results.  

## 2018-02-08 NOTE — Telephone Encounter (Signed)
Patient called requesting her lab work [phone]

## 2018-03-29 ENCOUNTER — Ambulatory Visit: Payer: BLUE CROSS/BLUE SHIELD | Admitting: Family Medicine

## 2018-03-29 ENCOUNTER — Encounter: Payer: Self-pay | Admitting: Family Medicine

## 2018-03-29 VITALS — BP 134/82 | Temp 98.0°F | Ht 62.0 in | Wt 212.0 lb

## 2018-03-29 DIAGNOSIS — N3000 Acute cystitis without hematuria: Secondary | ICD-10-CM | POA: Diagnosis not present

## 2018-03-29 DIAGNOSIS — F4321 Adjustment disorder with depressed mood: Secondary | ICD-10-CM | POA: Diagnosis not present

## 2018-03-29 DIAGNOSIS — R300 Dysuria: Secondary | ICD-10-CM | POA: Diagnosis not present

## 2018-03-29 LAB — POCT URINALYSIS DIPSTICK: pH, UA: 5 (ref 5.0–8.0)

## 2018-03-29 MED ORDER — NITROFURANTOIN MONOHYD MACRO 100 MG PO CAPS: ORAL_CAPSULE | ORAL | 0 refills | Status: DC

## 2018-03-29 MED ORDER — ONDANSETRON 4 MG PO TBDP: 4.0000 mg | ORAL_TABLET | Freq: Three times a day (TID) | ORAL | 0 refills | Status: DC | PRN

## 2018-03-29 NOTE — Progress Notes (Signed)
   Subjective:    Patient ID: Jessica Kirby, female    DOB: 1956-03-08, 62 y.o.   MRN: [id number]  HPIFrequent urination and burning for 2 weeks.   Frequent urination thru the night  Results for orders placed or performed in visit on 03/29/18  POCT urinalysis dipstick  Result Value Ref Range   Color, UA     Clarity, UA     Glucose, UA  Negative   Bilirubin, UA +    Ketones, UA     Spec Grav, UA >=1.030 (A) 1.010 - 1.025   Blood, UA     pH, UA 5.0 5.0 - 8.0   Protein, UA  Negative   Urobilinogen, UA  0.2 or 1.0 E.U./dL   Nitrite, UA     Leukocytes, UA Trace (A) Negative   Appearance     Odor     Last uti for not any reent  n feve or chills  Just not feeling the   Slight nausea   Feeling dim engy    Patient also having some challenges with grief understandably due to the loss of her husband long cancer/they were married 48 years   Review of Systems No headache, no major weight loss or weight gain, no chest pain no back pain abdominal pain no change in bowel habits complete ROS otherwise negative     Objective:   Physical Exam  Alert vitals stable, NAD. Blood pressure good on repeat. HEENT normal. Lungs clear. Heart regular rate and rhythm. No CVA tenderness  Urinalysis 4-6 white blood cells per high-power field      Assessment & Plan:  Impression urinary tract infection plan antibiotics prescribed.  Zofran as needed for nausea symptom care discussed.  Grief also discussed

## 2018-04-18 ENCOUNTER — Telehealth: Payer: Self-pay | Admitting: Family Medicine

## 2018-04-18 ENCOUNTER — Other Ambulatory Visit: Payer: Self-pay | Admitting: *Deleted

## 2018-04-18 MED ORDER — CIPROFLOXACIN HCL 250 MG PO TABS: 250.0000 mg | ORAL_TABLET | Freq: Two times a day (BID) | ORAL | 0 refills | Status: DC

## 2018-04-18 NOTE — Telephone Encounter (Signed)
Med sent to pharm. Left message to return call  

## 2018-04-18 NOTE — Telephone Encounter (Signed)
cipro 250 bid 7 d 

## 2018-04-18 NOTE — Telephone Encounter (Signed)
Pt was seen on 03/29/18 for UTI. She has completed the antibiotic and has started with frequent urination again and feeling sick like last time. She is wanting to know if the same antibiotic or another one could be called in [phone] - [place] [place] - [address] [phone].

## 2018-04-18 NOTE — Telephone Encounter (Signed)
Pt.notified

## 2018-06-15 ENCOUNTER — Other Ambulatory Visit: Payer: Self-pay | Admitting: Family Medicine

## 2018-06-15 DIAGNOSIS — Z1231 Encounter for screening mammogram for malignant neoplasm of breast: Secondary | ICD-10-CM

## 2018-06-30 ENCOUNTER — Ambulatory Visit: Payer: BLUE CROSS/BLUE SHIELD | Admitting: Family Medicine

## 2018-06-30 ENCOUNTER — Encounter: Payer: Self-pay | Admitting: Family Medicine

## 2018-06-30 VITALS — BP 140/82 | Temp 98.4°F | Ht 62.0 in | Wt 218.0 lb

## 2018-06-30 DIAGNOSIS — L0291 Cutaneous abscess, unspecified: Secondary | ICD-10-CM

## 2018-06-30 MED ORDER — DOXYCYCLINE HYCLATE 100 MG PO TABS: 100.0000 mg | ORAL_TABLET | Freq: Two times a day (BID) | ORAL | 0 refills | Status: AC

## 2018-06-30 NOTE — Progress Notes (Signed)
   Subjective:    Patient ID: Jessica Kirby, female    DOB: [birth year], 62 y.o.   MRN: [id number]  HPI Patient is here today with complaints of a knot above her private area that is getting larger and is sore to touch.She states she has noticed it in the last two - three days. She has been using hot compresses on it.  Review of Systems  Constitutional: Negative for fever.  Skin: Positive for color change (erythema).       Negative for drainage       Objective:   Physical Exam  Constitutional: She is oriented to person, place, and time. She appears well-developed and well-nourished.  HENT:  Head: Normocephalic and atraumatic.  Pulmonary/Chest: Effort normal.  Neurological: She is alert and oriented to person, place, and time.  Skin: Skin is warm and dry. There is erythema.  Small area of redness with hardened nodule under the skin palpated superior to mons pubis, midline, about 1 inch in diameter.  Nursing note and vitals reviewed.     Assessment & Plan:  Abscess  I&D performed by Dr. Lorin Picket, small amount of pus drained, abscess packed and dressing applied. Instructed patient on keeping area clean and dry and applying warm compresses tonight. Doxycycline ordered. She will f/u with Dr. Lorin Picket tomorrow to reassess abscess.   As attending physician to this patient visit, this patient was seen in conjunction with the nurse practitioner.  The history,physical and treatment plan was reviewed with the nurse practitioner and pertinent findings were verified with the patient.  Also the treatment plan was reviewed with the patient while they were present. SAL

## 2018-06-30 NOTE — Patient Instructions (Signed)
Skin Abscess A skin abscess is an infected area on or under your skin that contains pus and other material. An abscess can happen almost anywhere on your body. Some abscesses break open (rupture) on their own. Most continue to get worse unless they are treated. The infection can spread deeper into the body and into your blood, which can make you feel sick. Treatment usually involves draining the abscess. Follow these instructions at home: Abscess Care  If you have an abscess that has not drained, place a warm, clean, wet washcloth over the abscess several times a day. Do this as told by your doctor.  Follow instructions from your doctor about how to take care of your abscess. Make sure you: ? Cover the abscess with a bandage (dressing). ? Change your bandage or gauze as told by your doctor. ? Wash your hands with soap and water before you change the bandage or gauze. If you cannot use soap and water, use hand sanitizer.  Check your abscess every day for signs that the infection is getting worse. Check for: ? More redness, swelling, or pain. ? More fluid or blood. ? Warmth. ? More pus or a bad smell. Medicines   Take over-the-counter and prescription medicines only as told by your doctor.  If you were prescribed an antibiotic medicine, take it as told by your doctor. Do not stop taking the antibiotic even if you start to feel better. General instructions  To avoid spreading the infection: ? Do not share personal care items, towels, or hot tubs with others. ? Avoid making skin-to-skin contact with other people.  Keep all follow-up visits as told by your doctor. This is important. Contact a doctor if:  You have more redness, swelling, or pain around your abscess.  You have more fluid or blood coming from your abscess.  Your abscess feels warm when you touch it.  You have more pus or a bad smell coming from your abscess.  You have a fever.  Your muscles ache.  You have  chills.  You feel sick. Get help right away if:  You have very bad (severe) pain.  You see red streaks on your skin spreading away from the abscess. This information is not intended to replace advice given to you by your health care provider. Make sure you discuss any questions you have with your health care provider. Document Released: 03/02/2008 Document Revised: 05/10/2016 Document Reviewed: 07/24/2015 Elsevier Interactive Patient Education  2018 [employer]  

## 2018-07-01 ENCOUNTER — Encounter: Payer: Self-pay | Admitting: Family Medicine

## 2018-07-01 ENCOUNTER — Ambulatory Visit (INDEPENDENT_AMBULATORY_CARE_PROVIDER_SITE_OTHER): Payer: BLUE CROSS/BLUE SHIELD | Admitting: Family Medicine

## 2018-07-01 VITALS — BP 128/86 | Temp 98.3°F | Ht 62.0 in | Wt 217.8 lb

## 2018-07-01 DIAGNOSIS — L0291 Cutaneous abscess, unspecified: Secondary | ICD-10-CM | POA: Diagnosis not present

## 2018-07-01 NOTE — Progress Notes (Signed)
   Subjective:    Patient ID: Jessica Kirby, female    DOB: 13-Feb-1956, 62 y.o.   MRN: [id number]  HPI Pt here today for recheck on abscess.  She was treated in a few days ago with incision for an abscess Now this area is no longer draining.  She states she is doing better.  She states no high fever chills or sweats Review of Systems     Objective:   Physical Exam  On exam there is a small abscess that has adequately drained very good packing was removed without difficulty      Assessment & Plan:  Abscess Doing much much better Finish out antibiotics over the next 5 to 7 days no need to take longer than that Follow-up if ongoing troubles

## 2018-07-01 NOTE — Patient Instructions (Signed)
Warm compresses 20 minutes at a time I recommend doing the compresses every 2-3 hours when awake Do that for the next few days By mid next week things should be very good if any problems let us know

## 2018-07-18 ENCOUNTER — Ambulatory Visit (HOSPITAL_COMMUNITY)
Admission: RE | Admit: 2018-07-18 | Discharge: 2018-07-18 | Disposition: A | Discharge status: Home / Self Care | Payer: BLUE CROSS/BLUE SHIELD | Source: Ambulatory Visit | Attending: Family Medicine | Admitting: Family Medicine

## 2018-07-18 DIAGNOSIS — Z1231 Encounter for screening mammogram for malignant neoplasm of breast: Secondary | ICD-10-CM | POA: Insufficient documentation

## 2018-07-19 ENCOUNTER — Ambulatory Visit: Payer: BLUE CROSS/BLUE SHIELD | Admitting: Family Medicine

## 2018-07-19 ENCOUNTER — Encounter: Payer: Self-pay | Admitting: Family Medicine

## 2018-07-19 VITALS — BP 130/90 | Ht 62.0 in | Wt 220.4 lb

## 2018-07-19 DIAGNOSIS — F4321 Adjustment disorder with depressed mood: Secondary | ICD-10-CM | POA: Diagnosis not present

## 2018-07-19 DIAGNOSIS — F418 Other specified anxiety disorders: Secondary | ICD-10-CM

## 2018-07-19 MED ORDER — ESCITALOPRAM OXALATE 10 MG PO TABS: ORAL_TABLET | ORAL | 5 refills | Status: DC

## 2018-07-19 NOTE — Progress Notes (Signed)
   Subjective:    Patient ID: Jessica Kirby, female    DOB: 25-Aug-1956, 62 y.o.   MRN: [id number]  Depression         This is a chronic problem.  Associated symptoms include insomnia and headaches.( Weight gain, crying spells)  Pt husband passed in April.   Trouble sleeping  Dim energy   nnot exercising   Feeling   Retired from work for husband sake     Half sister passed this wk, and bro in Social worker has stage for lung ca  Review of Systems  Neurological: Positive for headaches.  Psychiatric/Behavioral: Positive for depression. The patient has insomnia.        Objective:   Physical Exam  Alert and oriented, vitals reviewed and stable, NAD ENT-TM's and ext canals WNL bilat via otoscopic exam Soft palate, tonsils and post pharynx WNL via oropharyngeal exam Neck-symmetric, no masses; thyroid nonpalpable and nontender Pulmonary-no tachypnea or accessory muscle use; Clear without wheezes via auscultation Card--no abnrml murmurs, rhythm reg and rate WNL Carotid pulses symmetric, without bruits       Assessment & Plan:  Impression grief with substantial depression.  Discussed the great length.  PHQ 9 reveals no suicidal thoughts thankfully.  Lost husband of 48 years of this spring.  Very difficult passage.  He died from lung cancer.  A lot of stress on the patient and family.  Still having daily crying spells.  Has a history the past with depression.  Feels she needs to get back on medication.  Lexapro discussed.  Pros and cons discussed will initiate.  Counseling discussed and offered.  Patient will think about it follow-up in several months in this regard.  Greater than 50% of this 25 minute face to face visit was spent in counseling and discussion and coordination of care regarding the above diagnosis/diagnosies

## 2018-08-01 ENCOUNTER — Encounter: Payer: BLUE CROSS/BLUE SHIELD | Admitting: Family Medicine

## 2018-08-01 ENCOUNTER — Encounter: Payer: BLUE CROSS/BLUE SHIELD | Admitting: Nurse Practitioner

## 2018-08-05 ENCOUNTER — Ambulatory Visit (INDEPENDENT_AMBULATORY_CARE_PROVIDER_SITE_OTHER): Payer: BLUE CROSS/BLUE SHIELD | Admitting: Family Medicine

## 2018-08-05 ENCOUNTER — Encounter: Payer: Self-pay | Admitting: Family Medicine

## 2018-08-05 VITALS — BP 130/80 | Ht 62.0 in | Wt 222.2 lb

## 2018-08-05 DIAGNOSIS — Z23 Encounter for immunization: Secondary | ICD-10-CM | POA: Diagnosis not present

## 2018-08-05 DIAGNOSIS — Z0000 Encounter for general adult medical examination without abnormal findings: Secondary | ICD-10-CM

## 2018-08-05 DIAGNOSIS — I10 Essential (primary) hypertension: Secondary | ICD-10-CM | POA: Diagnosis not present

## 2018-08-05 DIAGNOSIS — K219 Gastro-esophageal reflux disease without esophagitis: Secondary | ICD-10-CM | POA: Diagnosis not present

## 2018-08-05 MED ORDER — INDAPAMIDE 2.5 MG PO TABS: ORAL_TABLET | ORAL | 5 refills | Status: DC

## 2018-08-05 MED ORDER — ZOSTER VAC RECOMB ADJUVANTED 50 MCG/0.5ML IM SUSR: 0.5000 mL | Freq: Once | INTRAMUSCULAR | 1 refills | Status: AC

## 2018-08-05 MED ORDER — POTASSIUM CHLORIDE CRYS ER 20 MEQ PO TBCR: EXTENDED_RELEASE_TABLET | ORAL | 5 refills | Status: DC

## 2018-08-05 MED ORDER — FAMOTIDINE 20 MG PO TABS: 20.0000 mg | ORAL_TABLET | Freq: Two times a day (BID) | ORAL | 5 refills | Status: DC

## 2018-08-05 MED ORDER — AMLODIPINE BESYLATE 5 MG PO TABS: 5.0000 mg | ORAL_TABLET | Freq: Every day | ORAL | 5 refills | Status: DC

## 2018-08-05 NOTE — Patient Instructions (Signed)
DASH Eating Plan DASH stands for "Dietary Approaches to Stop Hypertension." The DASH eating plan is a healthy eating plan that has been shown to reduce high blood pressure (hypertension). It may also reduce your risk for type 2 diabetes, heart disease, and stroke. The DASH eating plan may also help with weight loss. What are tips for following this plan? General guidelines  Avoid eating more than 2,300 mg (milligrams) of salt (sodium) a day. If you have hypertension, you may need to reduce your sodium intake to 1,500 mg a day.  Limit alcohol intake to no more than 1 drink a day for nonpregnant women and 2 drinks a day for men. One drink equals 12 oz of beer, 5 oz of wine, or 1 oz of hard liquor.  Work with your health care provider to maintain a healthy body weight or to lose weight. Ask what an ideal weight is for you.  Get at least 30 minutes of exercise that causes your heart to beat faster (aerobic exercise) most days of the week. Activities may include walking, swimming, or biking.  Work with your health care provider or diet and nutrition specialist (dietitian) to adjust your eating plan to your individual calorie needs. Reading food labels  Check food labels for the amount of sodium per serving. Choose foods with less than 5 percent of the Daily Value of sodium. Generally, foods with less than 300 mg of sodium per serving fit into this eating plan.  To find whole grains, look for the word "whole" as the first word in the ingredient list. Shopping  Buy products labeled as "low-sodium" or "no salt added."  Buy fresh foods. Avoid canned foods and premade or frozen meals. Cooking  Avoid adding salt when cooking. Use salt-free seasonings or herbs instead of table salt or sea salt. Check with your health care provider or pharmacist before using salt substitutes.  Do not fry foods. Cook foods using healthy methods such as baking, boiling, grilling, and broiling instead.  Cook with  heart-healthy oils, such as olive, canola, soybean, or sunflower oil. Meal planning   Eat a balanced diet that includes: ? 5 or more servings of fruits and vegetables each day. At each meal, try to fill half of your plate with fruits and vegetables. ? Up to 6-8 servings of whole grains each day. ? Less than 6 oz of lean meat, poultry, or fish each day. A 3-oz serving of meat is about the same size as a deck of cards. One egg equals 1 oz. ? 2 servings of low-fat dairy each day. ? A serving of nuts, seeds, or beans 5 times each week. ? Heart-healthy fats. Healthy fats called Omega-3 fatty acids are found in foods such as flaxseeds and coldwater fish, like sardines, salmon, and mackerel.  Limit how much you eat of the following: ? Canned or prepackaged foods. ? Food that is high in trans fat, such as fried foods. ? Food that is high in saturated fat, such as fatty meat. ? Sweets, desserts, sugary drinks, and other foods with added sugar. ? Full-fat dairy products.  Do not salt foods before eating.  Try to eat at least 2 vegetarian meals each week.  Eat more home-cooked food and less restaurant, buffet, and fast food.  When eating at a restaurant, ask that your food be prepared with less salt or no salt, if possible. What foods are recommended? The items listed may not be a complete list. Talk with your dietitian about what   dietary choices are best for you. Grains Whole-grain or whole-wheat bread. Whole-grain or whole-wheat pasta. Brown rice. Oatmeal. Quinoa. Bulgur. Whole-grain and low-sodium cereals. Pita bread. Low-fat, low-sodium crackers. Whole-wheat flour tortillas. Vegetables Fresh or frozen vegetables (raw, steamed, roasted, or grilled). Low-sodium or reduced-sodium tomato and vegetable juice. Low-sodium or reduced-sodium tomato sauce and tomato paste. Low-sodium or reduced-sodium canned vegetables. Fruits All fresh, dried, or frozen fruit. Canned fruit in natural juice (without  added sugar). Meat and other protein foods Skinless chicken or turkey. Ground chicken or turkey. Pork with fat trimmed off. Fish and seafood. Egg whites. Dried beans, peas, or lentils. Unsalted nuts, nut butters, and seeds. Unsalted canned beans. Lean cuts of beef with fat trimmed off. Low-sodium, lean deli meat. Dairy Low-fat (1%) or fat-free (skim) milk. Fat-free, low-fat, or reduced-fat cheeses. Nonfat, low-sodium ricotta or cottage cheese. Low-fat or nonfat yogurt. Low-fat, low-sodium cheese. Fats and oils Soft margarine without trans fats. Vegetable oil. Low-fat, reduced-fat, or light mayonnaise and salad dressings (reduced-sodium). Canola, safflower, olive, soybean, and sunflower oils. Avocado. Seasoning and other foods Herbs. Spices. Seasoning mixes without salt. Unsalted popcorn and pretzels. Fat-free sweets. What foods are not recommended? The items listed may not be a complete list. Talk with your dietitian about what dietary choices are best for you. Grains Baked goods made with fat, such as croissants, muffins, or some breads. Dry pasta or rice meal packs. Vegetables Creamed or fried vegetables. Vegetables in a cheese sauce. Regular canned vegetables (not low-sodium or reduced-sodium). Regular canned tomato sauce and paste (not low-sodium or reduced-sodium). Regular tomato and vegetable juice (not low-sodium or reduced-sodium). Pickles. Olives. Fruits Canned fruit in a light or heavy syrup. Fried fruit. Fruit in cream or butter sauce. Meat and other protein foods Fatty cuts of meat. Ribs. Fried meat. Bacon. Sausage. Bologna and other processed lunch meats. Salami. Fatback. Hotdogs. Bratwurst. Salted nuts and seeds. Canned beans with added salt. Canned or smoked fish. Whole eggs or egg yolks. Chicken or turkey with skin. Dairy Whole or 2% milk, cream, and half-and-half. Whole or full-fat cream cheese. Whole-fat or sweetened yogurt. Full-fat cheese. Nondairy creamers. Whipped toppings.  Processed cheese and cheese spreads. Fats and oils Butter. Stick margarine. Lard. Shortening. Ghee. Bacon fat. Tropical oils, such as coconut, palm kernel, or palm oil. Seasoning and other foods Salted popcorn and pretzels. Onion salt, garlic salt, seasoned salt, table salt, and sea salt. Worcestershire sauce. Tartar sauce. Barbecue sauce. Teriyaki sauce. Soy sauce, including reduced-sodium. Steak sauce. Canned and packaged gravies. Fish sauce. Oyster sauce. Cocktail sauce. Horseradish that you find on the shelf. Ketchup. Mustard. Meat flavorings and tenderizers. Bouillon cubes. Hot sauce and Tabasco sauce. Premade or packaged marinades. Premade or packaged taco seasonings. Relishes. Regular salad dressings. Where to find more information:  National Heart, Lung, and Blood Institute: www.nhlbi.nih.gov  American Heart Association: www.heart.org Summary  The DASH eating plan is a healthy eating plan that has been shown to reduce high blood pressure (hypertension). It may also reduce your risk for type 2 diabetes, heart disease, and stroke.  With the DASH eating plan, you should limit salt (sodium) intake to 2,300 mg a day. If you have hypertension, you may need to reduce your sodium intake to 1,500 mg a day.  When on the DASH eating plan, aim to eat more fresh fruits and vegetables, whole grains, lean proteins, low-fat dairy, and heart-healthy fats.  Work with your health care provider or diet and nutrition specialist (dietitian) to adjust your eating plan to your individual   calorie needs. This information is not intended to replace advice given to you by your health care provider. Make sure you discuss any questions you have with your health care provider. Document Released: 09/03/2011 Document Revised: 09/07/2016 Document Reviewed: 09/07/2016 Elsevier Interactive Patient Education  2018 Elsevier Inc.  

## 2018-08-05 NOTE — Progress Notes (Signed)
Subjective:    Patient ID: Jessica Kirby, female    DOB: 15-Nov-1955, 62 y.o.   MRN: [id number]  HPI The patient comes in today for a wellness visit.  A review of their health history was completed. A review of medications was also completed.  Any needed refills; amlodipine and indapamide, potassium   Eating habits: needs to try to lose weight but no motivation  Falls/  MVA accidents in past few months: none  Regular exercise: no  Specialist pt sees on regular basis:  none  Preventative health issues were discussed.   Additional concerns: pt has concerns of ranitidine 150 mg.   Pt states that since she has started taking lexapro she has began to develop headaches at the back on head, around ears, started 2 days ago. States she began taking xyzal allergy medication and h/a resolved.   Started lexapro a couple weeks ago and feels like she is noticing some improvement and would like   HTN: Compliant with medications. Fluid pill just prn maybe 4-5 times per week. Taking potassium tid. Denies adverse effects.   Review of Systems  Constitutional: Negative for chills, fatigue, fever and unexpected weight change.  HENT: Negative for congestion, sinus pressure, sinus pain and sore throat.   Eyes: Negative for discharge and visual disturbance.  Respiratory: Negative for cough, shortness of breath and wheezing.   Cardiovascular: Negative for chest pain and leg swelling.  Gastrointestinal: Negative for abdominal pain, blood in stool, constipation, diarrhea, nausea and vomiting.  Genitourinary: Negative for difficulty urinating, hematuria, pelvic pain and vaginal discharge.  Neurological: Negative for dizziness, weakness and light-headedness.  Psychiatric/Behavioral: Negative for suicidal ideas.  All other systems reviewed and are negative.      Objective:   Physical Exam  Constitutional: She is oriented to person, place, and time. She appears well-developed and well-nourished. No  distress.  HENT:  Head: Normocephalic and atraumatic.  Right Ear: Tympanic membrane normal.  Left Ear: Tympanic membrane normal.  Nose: Nose normal.  Mouth/Throat: Uvula is midline and oropharynx is clear and moist.  Eyes: Pupils are equal, round, and reactive to light. Conjunctivae and EOM are normal. Right eye exhibits no discharge. Left eye exhibits no discharge.  Neck: Neck supple. No thyromegaly present.  Cardiovascular: Normal rate, regular rhythm and normal heart sounds.  No murmur heard. Pulmonary/Chest: Effort normal and breath sounds normal. No respiratory distress. She has no wheezes.  Abdominal: Soft. Bowel sounds are normal. She exhibits no distension and no mass. There is no tenderness.  Genitourinary:  Genitourinary Comments: Declines breast and GU exams, denies problems.  Musculoskeletal: She exhibits no edema or deformity.  Lymphadenopathy:    She has no cervical adenopathy.  Neurological: She is alert and oriented to person, place, and time. Coordination normal.  Skin: Skin is warm and dry.  Psychiatric: She has a normal mood and affect.  Nursing note and vitals reviewed.         Assessment & Plan:  1. Well adult exam Adult wellness-complete.wellness physical was conducted today. Importance of diet and exercise were discussed in detail.  In addition to this a discussion regarding safety was also covered. We also reviewed over immunizations and gave recommendations regarding current immunization needed for age.   -Flu and Tdap today In addition to this additional areas were also touched on including: Preventative health exams needed:  Colonoscopy UTD due in 2027 Mammogram UTD, yearly Pap Smear: N/A, hysterectomy  Patient was advised yearly wellness exam.  2. Essential  hypertension HTN- Patient was seen today as part of a visit regarding hypertension. Discussed the importance of a healthy diet and regular physical activity, as well as the importance of  compliance with medications.  Ideal goal is to keep blood pressure low, elevated levels certainly below 140/90 when possible.  The patient was counseled that keeping blood pressure under control will lessen the risk of complications. Low-salt diet such as DASH recommended, as well as regular physical activity. Patient was advised to keep regular follow-ups.  Will refill medications. No need for labwork today.   3. Gastroesophageal reflux disease without esophagitis Will change ranitidine to equivalent dose of pepcid.   Dr. Lubertha South was consulted on this case and is in agreement with the above treatment plan.

## 2018-09-06 ENCOUNTER — Telehealth: Payer: Self-pay | Admitting: Family Medicine

## 2018-09-06 MED ORDER — AMOXICILLIN 500 MG PO CAPS: 500.0000 mg | ORAL_CAPSULE | Freq: Three times a day (TID) | ORAL | 0 refills | Status: DC

## 2018-09-06 NOTE — Telephone Encounter (Signed)
Pt was seen on 08/05/18 for her physical. She had mentioned to [place] her ears felt a little pressure. Now her ears still have the pressure, her nose is stuffy with yellow greenish mucus. She has been taking her allergy medication and her inhaler. She is hoping [facility] can [id number] - [place], [place] - [address]

## 2018-09-06 NOTE — Telephone Encounter (Signed)
Prescription sent electronically to pharmacy. Patient notified. 

## 2018-09-06 NOTE — Telephone Encounter (Signed)
amox 500 tid ten d 

## 2018-10-14 ENCOUNTER — Encounter: Payer: Self-pay | Admitting: Family Medicine

## 2018-10-14 ENCOUNTER — Ambulatory Visit (INDEPENDENT_AMBULATORY_CARE_PROVIDER_SITE_OTHER): Payer: BLUE CROSS/BLUE SHIELD | Admitting: Family Medicine

## 2018-10-14 VITALS — BP 128/86 | Temp 97.9°F | Ht 62.0 in | Wt 223.6 lb

## 2018-10-14 DIAGNOSIS — H6991 Unspecified Eustachian tube disorder, right ear: Secondary | ICD-10-CM

## 2018-10-14 MED ORDER — FLUTICASONE PROPIONATE 50 MCG/ACT NA SUSP: 2.0000 | Freq: Every day | NASAL | 5 refills | Status: DC

## 2018-10-14 NOTE — Progress Notes (Signed)
   Subjective:    Patient ID: Jessica Kirby, female    DOB: December 30, 1955, 63 y.o.   MRN: [id number]  Otalgia   There is pain in the right ear. This is a new problem. The current episode started in the past 7 days. Associated symptoms comments: Trouble hearing. She has tried nothing for the symptoms.   Patient with mild right ear pain and discomfort radiates into the sinus and behind the ear denies double vision denies nausea vomiting with it states the pain comes and goes sometimes more intense than others   Review of Systems  HENT: Positive for ear pain.   Denies headache sore throat wheezing difficulty breathing nausea vomiting diarrhea     Objective:   Physical Exam Left eardrum normal throat is normal neck no masses no adenopathy sinuses nontender right eardrum retracted but normal  Lungs are clear heart is regular no murmur no respiratory distress    Assessment & Plan:  Right otalgia No sign of ear infection Eustachian tube dysfunction Warning signs discussed follow-up if progressive troubles If not doing better over the next 2 weeks notify us we will set her up for ENT evaluation Flonase for the next 2 weeks

## 2018-10-24 ENCOUNTER — Encounter: Payer: Self-pay | Admitting: Family Medicine

## 2018-10-24 ENCOUNTER — Ambulatory Visit: Payer: BLUE CROSS/BLUE SHIELD | Admitting: Family Medicine

## 2018-10-24 VITALS — BP 130/80 | Ht 62.0 in | Wt 223.0 lb

## 2018-10-24 DIAGNOSIS — H9201 Otalgia, right ear: Secondary | ICD-10-CM

## 2018-10-24 DIAGNOSIS — F418 Other specified anxiety disorders: Secondary | ICD-10-CM | POA: Diagnosis not present

## 2018-10-24 DIAGNOSIS — F4321 Adjustment disorder with depressed mood: Secondary | ICD-10-CM | POA: Diagnosis not present

## 2018-10-24 DIAGNOSIS — I10 Essential (primary) hypertension: Secondary | ICD-10-CM

## 2018-10-24 MED ORDER — POTASSIUM CHLORIDE CRYS ER 20 MEQ PO TBCR: EXTENDED_RELEASE_TABLET | ORAL | 5 refills | Status: DC

## 2018-10-24 MED ORDER — AMLODIPINE BESYLATE 5 MG PO TABS: 5.0000 mg | ORAL_TABLET | Freq: Every day | ORAL | 5 refills | Status: DC

## 2018-10-24 MED ORDER — FAMOTIDINE 20 MG PO TABS: 20.0000 mg | ORAL_TABLET | Freq: Two times a day (BID) | ORAL | 5 refills | Status: DC

## 2018-10-24 MED ORDER — INDAPAMIDE 2.5 MG PO TABS: ORAL_TABLET | ORAL | 5 refills | Status: DC

## 2018-10-24 NOTE — Progress Notes (Signed)
   Subjective:    Patient ID: Jessica Kirby, female    DOB: Feb 16, 1956, 63 y.o.   MRN: [id number] Patient arrives with numerous issues HPI  Patient arrives for a follow up on depression.     Patient also stated that she is ready to proceed with an appointment with Dr Suszanne Conners- the flonase has not helped a lot. Pt has had fairly sig problem with pain and discomfort   Feeling full and chroically  Tinnitus that bothers the pt  No majr hx of ear problems  Pt trying to get more active nd getting out of the house  cked out the senior center and working on getting to the y  Pt has had grief and an element of depr, and noting mild headaches, wonders if lexapro contributin   Blood pressure medicine and blood pressure levels reviewed today with patient. Compliant with blood pressure medicine. States does not miss a dose. No obvious side effects. Blood pressure generally good when checked elsewhere. Watching salt intake.    Review of Systems No headache, no major weight loss or weight gain, no chest pain no back pain abdominal pain no change in bowel habits complete ROS otherwise negative     Objective:   Physical Exam  Alert and oriented, vitals reviewed and stable, NAD ENT-TM's and ext canals right TM retraction otherwise WNL bilat via otoscopic exam Soft palate, tonsils and post pharynx WNL via oropharyngeal exam Neck-symmetric, no masses; thyroid nonpalpable and nontender Pulmonary-no tachypnea or accessory muscle use; Clear without wheezes via auscultation Card--no abnrml murmurs, rhythm reg and rate WNL Carotid pulses symmetric, without bruits       Assessment & Plan:  Impression grief/depression and anxiety patient would like to get off the Lexapro.  We discussed the wound.  States doing better and would prefer not to take medication  2.  Persistent right middle ear symptomatology.  Effusion via exam.  Patient frustrated and irritated by the sensation.  Not resolving with  steroid nasal spray.  Would like to see an ENT  3.  Hypertension good control discussed maintain same meds.  Exercise encouraged  Follow-up in 6 months

## 2018-10-27 ENCOUNTER — Encounter: Payer: Self-pay | Admitting: Family Medicine

## 2018-11-14 ENCOUNTER — Telehealth: Payer: Self-pay | Admitting: Family Medicine

## 2018-11-14 MED ORDER — AMOXICILLIN-POT CLAVULANATE 875-125 MG PO TABS: 1.0000 | ORAL_TABLET | Freq: Two times a day (BID) | ORAL | 0 refills | Status: AC

## 2018-11-14 NOTE — Telephone Encounter (Signed)
Aug 875 bid 10d

## 2018-11-14 NOTE — Telephone Encounter (Signed)
Pt was seen 1/17 by Dr. Lorin Picket and given flonase. She has an appt set with Dr. Suszanne Conners but not until March. She is hoping something could be called in for a sinus infection. She states her face is sore and she has pressure at her eyes and they are itchy and she is congested. No cough, sore throat, fever or runny nose.   If able to call something in please send to [id number] - [place], [place] - [address]

## 2018-11-14 NOTE — Telephone Encounter (Signed)
Prescription sent electronically to pharmacy. Patient notified. 

## 2018-12-01 ENCOUNTER — Ambulatory Visit (INDEPENDENT_AMBULATORY_CARE_PROVIDER_SITE_OTHER): Payer: BLUE CROSS/BLUE SHIELD | Admitting: Otolaryngology

## 2018-12-01 DIAGNOSIS — H903 Sensorineural hearing loss, bilateral: Secondary | ICD-10-CM

## 2018-12-01 DIAGNOSIS — J310 Chronic rhinitis: Secondary | ICD-10-CM

## 2018-12-01 DIAGNOSIS — J343 Hypertrophy of nasal turbinates: Secondary | ICD-10-CM | POA: Diagnosis not present

## 2018-12-01 DIAGNOSIS — H6983 Other specified disorders of Eustachian tube, bilateral: Secondary | ICD-10-CM

## 2018-12-19 ENCOUNTER — Other Ambulatory Visit: Payer: Self-pay

## 2018-12-19 MED ORDER — ALPRAZOLAM 0.5 MG PO TABS: 0.5000 mg | ORAL_TABLET | Freq: Every evening | ORAL | 2 refills | Status: DC | PRN

## 2018-12-19 NOTE — Telephone Encounter (Signed)
Three mo 

## 2019-01-17 ENCOUNTER — Ambulatory Visit (INDEPENDENT_AMBULATORY_CARE_PROVIDER_SITE_OTHER): Payer: BLUE CROSS/BLUE SHIELD | Admitting: Family Medicine

## 2019-01-17 ENCOUNTER — Other Ambulatory Visit: Payer: Self-pay

## 2019-01-17 ENCOUNTER — Encounter: Payer: Self-pay | Admitting: Family Medicine

## 2019-01-17 DIAGNOSIS — J301 Allergic rhinitis due to pollen: Secondary | ICD-10-CM

## 2019-01-17 NOTE — Progress Notes (Signed)
   Subjective:    Patient ID: Jessica Kirby, female    DOB: Jun 14, 1956, 63 y.o.   MRN: [id number] Audio plus visual HPI Pt is having sinus drainage. Pt states the drainage is going down back of throat. Started earlier this week. Pt has been taking Xyzal and Flonase. Mild relief. Pt is having dry cough at night, no headache, no runny nose.  Virtual Visit via Video Note  I connected with Jessica Kirby on 01/17/19 at 11:30 AM EDT by a video enabled telemedicine application and verified that I am speaking with the correct person using two identifiers.   I discussed the limitations of evaluation and management by telemedicine and the availability of in person appointments. The patient expressed understanding and agreed to proceed.  History of Present Illness:    Observations/Objective:   Assessment and Plan:   Follow Up Instructions:    I discussed the assessment and treatment plan with the patient. The patient was provided an opportunity to ask questions and all were answered. The patient agreed with the plan and demonstrated an understanding of the instructions.   The patient was advised to call back or seek an in-person evaluation if the symptoms worsen or if the condition fails to improve as anticipated.  I provided 20 minutes of non-face-to-face time during this encounter.  Nose all stuffy and congested  Pt saw dr Dallie Piles  Was given pred   xysal Not helping much   Pt was on claritin didn't do much  [name], LPN     Review of Systems     Objective:   Physical Exam        Assessment & Plan:  Impression.  Probable allergic rhinitis.  Discussed.  Options discussed.  Patient to maintain antihistamine plus steroid nasal spray.  Warning signs discussed carefully

## 2019-03-06 ENCOUNTER — Telehealth: Payer: Self-pay | Admitting: Family Medicine

## 2019-03-06 DIAGNOSIS — Z79899 Other long term (current) drug therapy: Secondary | ICD-10-CM

## 2019-03-06 DIAGNOSIS — I10 Essential (primary) hypertension: Secondary | ICD-10-CM

## 2019-03-06 DIAGNOSIS — Z13220 Encounter for screening for lipoid disorders: Secondary | ICD-10-CM

## 2019-03-06 DIAGNOSIS — E039 Hypothyroidism, unspecified: Secondary | ICD-10-CM

## 2019-03-06 NOTE — Telephone Encounter (Signed)
Last labs 02/01/18 lipid, liver, bmp, tsh

## 2019-03-06 NOTE — Telephone Encounter (Signed)
Sure repeat

## 2019-03-06 NOTE — Telephone Encounter (Signed)
Pt has med check on July 8th. She would like to know if Dr. Richardson Landry would like her to have lab work done before appt.

## 2019-03-06 NOTE — Telephone Encounter (Signed)
Orders put in. Pt notified.  

## 2019-03-22 DIAGNOSIS — Z13220 Encounter for screening for lipoid disorders: Secondary | ICD-10-CM | POA: Diagnosis not present

## 2019-03-22 DIAGNOSIS — E039 Hypothyroidism, unspecified: Secondary | ICD-10-CM | POA: Diagnosis not present

## 2019-03-22 DIAGNOSIS — I10 Essential (primary) hypertension: Secondary | ICD-10-CM | POA: Diagnosis not present

## 2019-03-22 DIAGNOSIS — Z79899 Other long term (current) drug therapy: Secondary | ICD-10-CM | POA: Diagnosis not present

## 2019-03-23 LAB — LIPID PANEL
Chol/HDL Ratio: 4.3 ratio (ref 0.0–4.4)
Cholesterol, Total: 200 mg/dL — ABNORMAL HIGH (ref 100–199)
HDL: 47 mg/dL (ref >39)
LDL Calculated: 125 mg/dL — ABNORMAL HIGH (ref 0–99)
Triglycerides: 139 mg/dL (ref 0–149)
VLDL Cholesterol Cal: 28 mg/dL (ref 5–40)

## 2019-03-23 LAB — BASIC METABOLIC PANEL
BUN/Creatinine Ratio: 13 (ref 12–28)
BUN: 9 mg/dL (ref 8–27)
CO2: 23 mmol/L (ref 20–29)
Calcium: 9.1 mg/dL (ref 8.7–10.3)
Chloride: 105 mmol/L (ref 96–106)
Creatinine, Ser: 0.71 mg/dL (ref 0.57–1.00)
GFR calc Af Amer: 104 mL/min/{1.73_m2} (ref >59)
GFR calc non Af Amer: 90 mL/min/{1.73_m2} (ref >59)
Glucose: 132 mg/dL — ABNORMAL HIGH (ref 65–99)
Potassium: 3.9 mmol/L (ref 3.5–5.2)
Sodium: 143 mmol/L (ref 134–144)

## 2019-03-23 LAB — HEPATIC FUNCTION PANEL
ALT: 30 IU/L (ref 0–32)
AST: 22 IU/L (ref 0–40)
Albumin: 4.6 g/dL (ref 3.8–4.8)
Alkaline Phosphatase: 74 IU/L (ref 39–117)
Bilirubin Total: 0.7 mg/dL (ref 0.0–1.2)
Bilirubin, Direct: 0.14 mg/dL (ref 0.00–0.40)
Total Protein: 6.9 g/dL (ref 6.0–8.5)

## 2019-03-23 LAB — TSH: TSH: 4.24 u[IU]/mL (ref 0.450–4.500)

## 2019-03-24 ENCOUNTER — Telehealth: Payer: Self-pay | Admitting: Family Medicine

## 2019-03-24 NOTE — Telephone Encounter (Signed)
Pt called to see if lab results were back  Explained that the results were here however Dr. Richardson Landry has been out of the office this week and once he signs off on them that we would notify the patient  Pt verbalized understanding

## 2019-03-27 NOTE — Telephone Encounter (Signed)
b w overall good glucose up a bit will disc fiurther at the office visit, that's why we do the b w b efore the visit

## 2019-03-27 NOTE — Telephone Encounter (Signed)
Contacted patient. Pt informed that blood work was overall good but glucose was up a lil. Informed pt that results will be discussed at visit. Pt verbalized understanding.

## 2019-03-27 NOTE — Telephone Encounter (Signed)
Is pt face to face or virtual??

## 2019-03-27 NOTE — Telephone Encounter (Signed)
Pt has phone visit scheduled for 04/05/2019. Please advise. Thank you

## 2019-04-05 ENCOUNTER — Other Ambulatory Visit: Payer: Self-pay

## 2019-04-05 ENCOUNTER — Ambulatory Visit (INDEPENDENT_AMBULATORY_CARE_PROVIDER_SITE_OTHER): Payer: Medicare Other | Admitting: Family Medicine

## 2019-04-05 DIAGNOSIS — I10 Essential (primary) hypertension: Secondary | ICD-10-CM

## 2019-04-05 DIAGNOSIS — J301 Allergic rhinitis due to pollen: Secondary | ICD-10-CM | POA: Diagnosis not present

## 2019-04-05 DIAGNOSIS — F4321 Adjustment disorder with depressed mood: Secondary | ICD-10-CM

## 2019-04-05 DIAGNOSIS — E039 Hypothyroidism, unspecified: Secondary | ICD-10-CM | POA: Diagnosis not present

## 2019-04-05 MED ORDER — FLUTICASONE PROPIONATE 50 MCG/ACT NA SUSP: 2.0000 | Freq: Every day | NASAL | 5 refills | Status: DC

## 2019-04-05 MED ORDER — POTASSIUM CHLORIDE CRYS ER 20 MEQ PO TBCR: EXTENDED_RELEASE_TABLET | ORAL | 5 refills | Status: DC

## 2019-04-05 MED ORDER — ALPRAZOLAM 0.5 MG PO TABS: 0.5000 mg | ORAL_TABLET | Freq: Every evening | ORAL | 5 refills | Status: DC | PRN

## 2019-04-05 MED ORDER — FAMOTIDINE 20 MG PO TABS: 20.0000 mg | ORAL_TABLET | Freq: Two times a day (BID) | ORAL | 5 refills | Status: DC

## 2019-04-05 MED ORDER — AMLODIPINE BESYLATE 5 MG PO TABS: 5.0000 mg | ORAL_TABLET | Freq: Every day | ORAL | 5 refills | Status: DC

## 2019-04-05 MED ORDER — INDAPAMIDE 2.5 MG PO TABS: ORAL_TABLET | ORAL | 5 refills | Status: DC

## 2019-04-05 NOTE — Progress Notes (Signed)
Subjective:  Audio only  Patient ID: Jessica Kirby, female    DOB: 1956-05-17, 63 y.o.   MRN: [id number] audio [phone] Hypertension This is a chronic problem. Risk factors for coronary artery disease include post-menopausal state. Treatments tried: norvasc. There are no compliance problems.     Discuss recent lab work  Virtual Visit via Video Note  I connected with Dustin Flock on 04/05/19 at  9:00 AM EDT by a video enabled telemedicine application and verified that I am speaking with the correct person using two identifiers.  Location: Patient: home Provider: office   I discussed the limitations of evaluation and management by telemedicine and the availability of in person appointments. The patient expressed understanding and agreed to proceed.  History of Present Illness:    Observations/Objective:   Assessment and Plan:   Follow Up Instructions:    I discussed the assessment and treatment plan with the patient. The patient was provided an opportunity to ask questions and all were answered. The patient agreed with the plan and demonstrated an understanding of the instructions.   The patient was advised to call back or seek an in-person evaluation if the symptoms worsen or if the condition fails to improve as anticipated.  I provided 25 minutes of non-face-to-face time during this encounter.   prediab.  Ongoing issues.  Working on his diet.  Sugar discussed.  Labs reviewed  Results for orders placed or performed in visit on 03/06/19  Lipid panel  Result Value Ref Range   Cholesterol, Total 200 (H) 100 - 199 mg/dL   Triglycerides 139 0 - 149 mg/dL   HDL 47 >39 mg/dL   VLDL Cholesterol Cal 28 5 - 40 mg/dL   LDL Calculated 125 (H) 0 - 99 mg/dL   Chol/HDL Ratio 4.3 0.0 - 4.4 ratio  Hepatic function panel  Result Value Ref Range   Total Protein 6.9 6.0 - 8.5 g/dL   Albumin 4.6 3.8 - 4.8 g/dL   Bilirubin Total 0.7 0.0 - 1.2 mg/dL   Bilirubin, Direct 0.14 0.00 -  0.40 mg/dL   Alkaline Phosphatase 74 39 - 117 IU/L   AST 22 0 - 40 IU/L   ALT 30 0 - 32 IU/L  Basic metabolic panel  Result Value Ref Range   Glucose 132 (H) 65 - 99 mg/dL   BUN 9 8 - 27 mg/dL   Creatinine, Ser 0.71 0.57 - 1.00 mg/dL   GFR calc non Af Amer 90 >59 mL/min/1.73   GFR calc Af Amer 104 >59 mL/min/1.73   BUN/Creatinine Ratio 13 12 - 28   Sodium 143 134 - 144 mmol/L   Potassium 3.9 3.5 - 5.2 mmol/L   Chloride 105 96 - 106 mmol/L   CO2 23 20 - 29 mmol/L   Calcium 9.1 8.7 - 10.3 mg/dL  TSH  Result Value Ref Range   TSH 4.240 0.450 - 4.500 uIU/mL     Xanax prn Patient compliant with insomnia medication. Generally takes most nights. No obvious morning drowsiness. Definitely helps patient sleep. Without it patient states would not get a good nights rest.  Stress is ongoing and substantial.  Lost spouse in the past year.  Was fairly difficult for the patient  History of reflux.  On Pepcid clinically stable  Review of Systems No headache, no major weight loss or weight gain, no chest pain no back pain abdominal pain no change in bowel habits complete ROS otherwise negative     Objective:  Physical Exam Virtual       Assessment & Plan:  Impression hypertension.  Apparent good control discussed to maintain same meds  2.  Insomnia ongoing need for Xanax Xanax  3.  Chronic stress discussed exercise encouraged meds  4.  Reflux clinically stable  Follow-up in 6 months diet exercise discussed medication

## 2019-04-14 ENCOUNTER — Encounter: Payer: Self-pay | Admitting: Family Medicine

## 2019-04-14 ENCOUNTER — Other Ambulatory Visit: Payer: Self-pay

## 2019-04-14 ENCOUNTER — Telehealth: Payer: Self-pay | Admitting: Family Medicine

## 2019-04-14 ENCOUNTER — Ambulatory Visit (INDEPENDENT_AMBULATORY_CARE_PROVIDER_SITE_OTHER): Payer: Medicare Other | Admitting: Family Medicine

## 2019-04-14 DIAGNOSIS — B9689 Other specified bacterial agents as the cause of diseases classified elsewhere: Secondary | ICD-10-CM | POA: Diagnosis not present

## 2019-04-14 DIAGNOSIS — J019 Acute sinusitis, unspecified: Secondary | ICD-10-CM | POA: Diagnosis not present

## 2019-04-14 MED ORDER — AMOXICILLIN-POT CLAVULANATE 875-125 MG PO TABS: ORAL_TABLET | ORAL | 0 refills | Status: DC

## 2019-04-14 MED ORDER — FLUTICASONE PROPIONATE 50 MCG/ACT NA SUSP: NASAL | 5 refills | Status: DC

## 2019-04-14 NOTE — Telephone Encounter (Signed)
Patient states having some sinus issues and wanting antibiotic called into [facility]-scales had virtual visit on 7/8

## 2019-04-14 NOTE — Progress Notes (Signed)
   Subjective:  Audio only  Patient ID: Jessica Kirby, female    DOB: 17-Jun-1956, 63 y.o.   MRN: [id number]  Sinusitis This is a new problem. There has been no fever (temp per pt was 97.4 this morning). (Stuffy nose, soreness in face) Treatments tried: flonase.   needs refill on flonase.   Virtual Visit via Telephone Note  I connected with Dustin Flock on 04/14/19 at 11:30 AM EDT by telephone and verified that I am speaking with the correct person using two identifiers.  Location: Patient: home Provider: office   I discussed the limitations, risks, security and privacy concerns of performing an evaluation and management service by telephone and the availability of in person appointments. I also discussed with the patient that there may be a patient responsible charge related to this service. The patient expressed understanding and agreed to proceed.   History of Present Illness:    Observations/Objective:   Assessment and Plan:   Follow Up Instructions:    I discussed the assessment and treatment plan with the patient. The patient was provided an opportunity to ask questions and all were answered. The patient agreed with the plan and demonstrated an understanding of the instructions.   The patient was advised to call back or seek an in-person evaluation if the symptoms worsen or if the condition fails to improve as anticipated.  I provided 15 minutes of non-face-to-face time during this encounter.  Progressive symptoms.  Past week.  Frontal congestion.  Frontal headache.  Worse with positional change achy times sharp at times stuffy nose.    Review of Systems .rs No headache, no major weight loss or weight gain, no chest pain no back pain abdominal pain no change in bowel habits complete ROS otherwise negative     Objective:   Physical Exam  Virtual      Assessment & Plan:  Impression probable rhinosinusitis.  Patient has a tendency.  Antibiotics prescribed.   Add Flonase.  No lower respiratory symptoms no fever no sore throat.  If these were to develop call us back but doubt coronavirus discussed

## 2019-04-14 NOTE — Telephone Encounter (Signed)
o v add on at 11 30

## 2019-04-14 NOTE — Telephone Encounter (Signed)
Patient scheduled virtual visit today with Dr Steve 

## 2019-04-17 ENCOUNTER — Ambulatory Visit (INDEPENDENT_AMBULATORY_CARE_PROVIDER_SITE_OTHER): Payer: BLUE CROSS/BLUE SHIELD | Admitting: Otolaryngology

## 2019-04-17 ENCOUNTER — Other Ambulatory Visit: Payer: Self-pay

## 2019-04-17 DIAGNOSIS — J343 Hypertrophy of nasal turbinates: Secondary | ICD-10-CM

## 2019-04-17 DIAGNOSIS — J310 Chronic rhinitis: Secondary | ICD-10-CM | POA: Diagnosis not present

## 2019-04-17 DIAGNOSIS — J342 Deviated nasal septum: Secondary | ICD-10-CM | POA: Diagnosis not present

## 2019-04-17 DIAGNOSIS — H6983 Other specified disorders of Eustachian tube, bilateral: Secondary | ICD-10-CM | POA: Diagnosis not present

## 2019-04-24 ENCOUNTER — Ambulatory Visit: Payer: BLUE CROSS/BLUE SHIELD | Admitting: Family Medicine

## 2019-05-02 DIAGNOSIS — M79672 Pain in left foot: Secondary | ICD-10-CM | POA: Diagnosis not present

## 2019-05-02 DIAGNOSIS — L600 Ingrowing nail: Secondary | ICD-10-CM | POA: Diagnosis not present

## 2019-05-02 DIAGNOSIS — M79675 Pain in left toe(s): Secondary | ICD-10-CM | POA: Diagnosis not present

## 2019-05-02 DIAGNOSIS — L03032 Cellulitis of left toe: Secondary | ICD-10-CM | POA: Diagnosis not present

## 2019-05-16 DIAGNOSIS — L600 Ingrowing nail: Secondary | ICD-10-CM | POA: Diagnosis not present

## 2019-05-16 DIAGNOSIS — M79675 Pain in left toe(s): Secondary | ICD-10-CM | POA: Diagnosis not present

## 2019-05-16 DIAGNOSIS — L03032 Cellulitis of left toe: Secondary | ICD-10-CM | POA: Diagnosis not present

## 2019-05-16 DIAGNOSIS — M79672 Pain in left foot: Secondary | ICD-10-CM | POA: Diagnosis not present

## 2019-05-23 DIAGNOSIS — L600 Ingrowing nail: Secondary | ICD-10-CM | POA: Diagnosis not present

## 2019-05-23 DIAGNOSIS — M79672 Pain in left foot: Secondary | ICD-10-CM | POA: Diagnosis not present

## 2019-05-23 DIAGNOSIS — L03032 Cellulitis of left toe: Secondary | ICD-10-CM | POA: Diagnosis not present

## 2019-05-23 DIAGNOSIS — M79675 Pain in left toe(s): Secondary | ICD-10-CM | POA: Diagnosis not present

## 2019-06-24 ENCOUNTER — Other Ambulatory Visit: Payer: Medicare Other

## 2019-06-30 ENCOUNTER — Other Ambulatory Visit: Payer: Self-pay

## 2019-06-30 MED ORDER — AMLODIPINE BESYLATE 5 MG PO TABS: 5.0000 mg | ORAL_TABLET | Freq: Every day | ORAL | 1 refills | Status: DC

## 2019-06-30 MED ORDER — FAMOTIDINE 20 MG PO TABS: 20.0000 mg | ORAL_TABLET | Freq: Two times a day (BID) | ORAL | 1 refills | Status: DC

## 2019-06-30 MED ORDER — INDAPAMIDE 2.5 MG PO TABS: ORAL_TABLET | ORAL | 1 refills | Status: DC

## 2019-07-19 ENCOUNTER — Other Ambulatory Visit (HOSPITAL_COMMUNITY): Payer: Self-pay | Admitting: Family Medicine

## 2019-07-19 DIAGNOSIS — Z1231 Encounter for screening mammogram for malignant neoplasm of breast: Secondary | ICD-10-CM

## 2019-07-28 ENCOUNTER — Other Ambulatory Visit: Payer: Self-pay

## 2019-07-28 ENCOUNTER — Encounter: Payer: Self-pay | Admitting: Nurse Practitioner

## 2019-07-28 ENCOUNTER — Ambulatory Visit (INDEPENDENT_AMBULATORY_CARE_PROVIDER_SITE_OTHER): Payer: Medicare Other | Admitting: Nurse Practitioner

## 2019-07-28 VITALS — BP 132/88 | Temp 97.4°F | Ht 62.0 in | Wt 206.0 lb

## 2019-07-28 DIAGNOSIS — Z780 Asymptomatic menopausal state: Secondary | ICD-10-CM

## 2019-07-28 DIAGNOSIS — Z23 Encounter for immunization: Secondary | ICD-10-CM | POA: Diagnosis not present

## 2019-07-28 DIAGNOSIS — Z0000 Encounter for general adult medical examination without abnormal findings: Secondary | ICD-10-CM

## 2019-07-28 DIAGNOSIS — M8580 Other specified disorders of bone density and structure, unspecified site: Secondary | ICD-10-CM

## 2019-07-28 DIAGNOSIS — R300 Dysuria: Secondary | ICD-10-CM | POA: Diagnosis not present

## 2019-07-28 DIAGNOSIS — R7301 Impaired fasting glucose: Secondary | ICD-10-CM

## 2019-07-28 LAB — POCT GLYCOSYLATED HEMOGLOBIN (HGB A1C): Hemoglobin A1C: 5.3 % (ref 4.0–5.6)

## 2019-07-28 LAB — POCT UA - MICROSCOPIC ONLY
Bacteria, U Microscopic: NEGATIVE
RBC, urine, microscopic: NEGATIVE
WBC, Ur, HPF, POC: NEGATIVE

## 2019-07-28 LAB — POCT URINALYSIS DIPSTICK
Spec Grav, UA: <=1.005 — AB (ref 1.010–1.025)
pH, UA: 5 (ref 5.0–8.0)

## 2019-07-28 NOTE — Progress Notes (Signed)
Subjective:    Patient ID: Jessica Kirby, female    DOB: [birth year], 63 y.o.   MRN: [id number]  HPI AWV- Annual Wellness Visit  Pt presents to the clinic with no specific complaints. Pt is trying to exercise by working in the garden and walking. Pt reports eating smaller meals and choosing healthy food options. Pt drinks water 6-7 bottles per day. Pt reports dysuria, urgency, and frequency. Symptoms have been on-going for about 4 days. Denies fever, chills, pelvic pain or flank pain. Pt has increased fatigue for the past year. Has been feeling depressed since her husband died last year. Pt does not have any functional impairment. Pt has received flu vaccine. Pt has regular dental and eye exams.  The patient was seen for their annual wellness visit. The patient's past medical history, surgical history, and family history were reviewed. Pertinent vaccines were reviewed ( tetanus, pneumonia, shingles, flu) The patient's medication list was reviewed and updated.  The height and weight were entered.  BMI recorded in electronic record elsewhere  Cognitive screening was completed. Outcome of Mini - Cog: pass   Falls /depression screening electronically recorded within record elsewhere  Current tobacco usage: none (All patients who use tobacco were given written and verbal information on quitting)  Alcohol/Drug: Denies any alcohol or drug use.  Recent listing of emergency department/hospitalizations over the past year were reviewed.  current specialist the patient sees on a regular basis: pass   Medicare annual wellness visit patient questionnaire was reviewed.  A written screening schedule for the patient for the next 5-10 years was given. Appropriate discussion of followup regarding next visit was discussed.  Depression screen [facility] 2/9 07/28/2019 10/24/2018 07/19/2018 08/16/2017  Decreased Interest 1 0 2 0  Down, Depressed, Hopeless 1 0 3 0  PHQ - 2 Score 2 0 5 0  Altered sleeping 2  2 3  0  Tired, decreased energy 1 1 3 1   Change in appetite 2 1 3 1   Feeling bad or failure about yourself  0 0 0 0  Trouble concentrating 0 0 2 0  Moving slowly or fidgety/restless 2 0 0 0  Suicidal thoughts 0 0 0 0  PHQ-9 Score 9 4 16 2   Difficult doing work/chores Not difficult at all Somewhat difficult Very difficult Not difficult at all     Review of Systems  Constitutional: Negative for activity change, appetite change, chills and fever.  HENT: Negative for ear pain, sinus pain and sneezing.   Respiratory: Negative for cough, shortness of breath and wheezing.   Cardiovascular: Negative for chest pain, palpitations and leg swelling.  Gastrointestinal: Negative for abdominal pain, blood in stool, constipation, diarrhea and vomiting.  Genitourinary: Positive for dysuria, frequency and urgency. Negative for difficulty urinating, flank pain, genital sores, pelvic pain, vaginal bleeding and vaginal discharge.  Musculoskeletal: Negative for back pain and myalgias.  Neurological: Negative for dizziness, syncope, weakness and light-headedness.  Psychiatric/Behavioral: Positive for sleep disturbance. Negative for confusion and decreased concentration.       Objective:   Physical Exam Exam conducted with a chaperone present.  Constitutional:      Appearance: Normal appearance.  Neck:     Thyroid: No thyroid mass or thyromegaly.  Cardiovascular:     Rate and Rhythm: Normal rate.     Heart sounds: No murmur.     Comments: Non-pitting LE edema, trace +1 Pulmonary:     Effort: No respiratory distress.     Breath sounds: Normal breath sounds. No  stridor. No wheezing or rhonchi.  Chest:     Chest wall: No mass, lacerations, swelling or tenderness.     Breasts:        Right: Normal. No swelling, bleeding, inverted nipple or mass.        Left: Normal. No swelling, bleeding, inverted nipple or mass.  Abdominal:     General: Abdomen is flat.     Palpations: There is no mass.      Tenderness: There is no abdominal tenderness. There is no rebound.     Hernia: No hernia is present.  Genitourinary:    Exam position: Lithotomy position.     Comments: External: No rashes or lesion. Vagina pale pink and dry. Internal: No lesions or rash on vaginal canal. No tenderness or masses palpated during bimanual exam. Exam limited due to abdominal girth. Lymphadenopathy:     Cervical: No cervical adenopathy.     Upper Body:     Right upper body: No supraclavicular, axillary or pectoral adenopathy.     Left upper body: No supraclavicular, axillary or pectoral adenopathy.  Skin:    General: Skin is warm and dry.     Comments: Two small sebaceous cysts on upper back, no drainage/redness present  Neurological:     Mental Status: She is alert and oriented to person, place, and time.  Psychiatric:        Mood and Affect: Mood normal.        Behavior: Behavior normal.    Results for orders placed or performed in visit on 07/28/19  POCT urinalysis dipstick  Result Value Ref Range   Color, UA     Clarity, UA     Glucose, UA     Bilirubin, UA     Ketones, UA     Spec Grav, UA <=1.005 (A) 1.010 - 1.025   Blood, UA     pH, UA 5.0 5.0 - 8.0   Protein, UA     Urobilinogen, UA     Nitrite, UA     Leukocytes, UA     Appearance     Odor    POCT glycosylated hemoglobin (Hb A1C)  Result Value Ref Range   Hemoglobin A1C 5.3 4.0 - 5.6 %   HbA1c POC (<> result, manual entry)     HbA1c, POC (prediabetic range)     HbA1c, POC (controlled diabetic range)    POCT UA - Microscopic Only  Result Value Ref Range   WBC, Ur, HPF, POC neg    RBC, urine, microscopic neg    Bacteria, U Microscopic neg    Mucus, UA     Epithelial cells, urine per micros     Crystals, Ur, HPF, POC     Casts, Ur, LPF, POC     Yeast, UA     Urine sample very dilute.    Assessment & Plan:   Problem List Items Addressed This Visit      Musculoskeletal and Integument   Osteopenia   Relevant Orders   DG  Bone Density    Other Visit Diagnoses    Routine general medical examination at a health care facility    -  Primary   Relevant Orders   DG Bone Density   Dysuria       Relevant Orders   POCT urinalysis dipstick (Completed)   Urine Culture   Elevated fasting glucose       Relevant Orders   POCT glycosylated hemoglobin (Hb A1C) (Completed)   Need  for vaccination       Relevant Orders   Pneumococcal polysaccharide vaccine 23-valent greater than or equal to 2yo subcutaneous/IM (Completed)   Post-menopausal       Relevant Orders   DG Bone Density     -Discussed the importance of exercise and diet in detail. Immunizations and health screenings discussed. Pt has a mammogram scheduled in November. Colonoscopy up to date, receiving them every 10 years. Provided education about Shingrix vaccine. Pt received Pneumovax 23 today. Dexa scan ordered. Pt declines any medication or interventions for depression at this time. Told patient if she has worsening symptoms, functional impairment, or suicidal ideation, please contact provider. Recommended OTC calcium and Vitamin D supplements. Urine culture pending. Go to Urgent Care over the weekend if urinary symptoms worsen.   Return in about 1 year (around 07/27/2020) for physical.

## 2019-07-28 NOTE — Patient Instructions (Addendum)
-  Take over the counter Vitamin D and Calcium.  -Research Hydroxyzine for sleep

## 2019-07-28 NOTE — Progress Notes (Signed)
   Subjective:    Patient ID: Jessica Kirby, female    DOB: 03/31/1956, 63 y.o.   MRN: [id number]  HPI AWV- Annual Wellness Visit  The patient was seen for their annual wellness visit. The patient's past medical history, surgical history, and family history were reviewed. Pertinent vaccines were reviewed ( tetanus, pneumonia, shingles, flu) The patient's medication list was reviewed and updated.  The height and weight were entered.  BMI recorded in electronic record elsewhere  Cognitive screening was completed. Outcome of Mini - Cog: pass   Falls /depression screening electronically recorded within record elsewhere  Current tobacco usage: none (All patients who use tobacco were given written and verbal information on quitting)  Recent listing of emergency department/hospitalizations over the past year were reviewed.  current specialist the patient sees on a regular basis: pass   Medicare annual wellness visit patient questionnaire was reviewed.  A written screening schedule for the patient for the next 5-10 years was given. Appropriate discussion of followup regarding next visit was discussed.  Results for orders placed or performed in visit on 07/28/19  POCT urinalysis dipstick  Result Value Ref Range   Color, UA     Clarity, UA     Glucose, UA     Bilirubin, UA     Ketones, UA     Spec Grav, UA <=1.005 (A) 1.010 - 1.025   Blood, UA     pH, UA 5.0 5.0 - 8.0   Protein, UA     Urobilinogen, UA     Nitrite, UA     Leukocytes, UA     Appearance     Odor           Review of Systems     Objective:   Physical Exam        Assessment & Plan:

## 2019-07-30 LAB — URINE CULTURE

## 2019-07-31 ENCOUNTER — Ambulatory Visit (HOSPITAL_COMMUNITY)
Admission: RE | Admit: 2019-07-31 | Discharge: 2019-07-31 | Disposition: A | Discharge status: Home / Self Care | Payer: Medicare Other | Source: Ambulatory Visit | Attending: Nurse Practitioner | Admitting: Nurse Practitioner

## 2019-07-31 ENCOUNTER — Ambulatory Visit (HOSPITAL_COMMUNITY)
Admission: RE | Admit: 2019-07-31 | Discharge: 2019-07-31 | Disposition: A | Discharge status: Home / Self Care | Payer: Medicare Other | Source: Ambulatory Visit | Attending: Family Medicine | Admitting: Family Medicine

## 2019-07-31 ENCOUNTER — Other Ambulatory Visit: Payer: Self-pay

## 2019-07-31 DIAGNOSIS — Z0000 Encounter for general adult medical examination without abnormal findings: Secondary | ICD-10-CM | POA: Diagnosis not present

## 2019-07-31 DIAGNOSIS — M8580 Other specified disorders of bone density and structure, unspecified site: Secondary | ICD-10-CM | POA: Insufficient documentation

## 2019-07-31 DIAGNOSIS — Z1231 Encounter for screening mammogram for malignant neoplasm of breast: Secondary | ICD-10-CM

## 2019-07-31 DIAGNOSIS — M8589 Other specified disorders of bone density and structure, multiple sites: Secondary | ICD-10-CM | POA: Diagnosis not present

## 2019-07-31 DIAGNOSIS — Z780 Asymptomatic menopausal state: Secondary | ICD-10-CM | POA: Insufficient documentation

## 2019-08-04 ENCOUNTER — Telehealth: Payer: Self-pay | Admitting: Family Medicine

## 2019-08-04 NOTE — Telephone Encounter (Signed)
Said she had a bunch of test lately and wants the results. Labs, mammogram,bone density.  (I am sending the patient a link to sign up for mychart also).

## 2019-08-04 NOTE — Telephone Encounter (Signed)
Left message to return call 

## 2019-08-04 NOTE — Telephone Encounter (Signed)
mammo on the second turned out normal, pt needs to be a bit patient a letter will be coming from them, we tried to call her on urine cx(see message) two d ago with unable to reach her, no recent blood work, bone density test sen results to carolyn to allow her to interpret and let pt know she'll hear from [place] sometimes next wk on this

## 2019-08-04 NOTE — Telephone Encounter (Signed)
Pt returned call and verbalized understanding. Pt is wanting to know if she is due for any labs. Please advise. Thank you

## 2019-08-04 NOTE — Telephone Encounter (Signed)
No further labs for now. We will be signing off on her bone density test soon. Thanks.

## 2019-08-04 NOTE — Telephone Encounter (Signed)
Please advise. Thank you

## 2019-08-07 NOTE — Telephone Encounter (Signed)
Left message to return call 

## 2019-08-08 NOTE — Telephone Encounter (Signed)
Patient advised per Hoyle Sauer: No further labs for now. Patient verbalized understanding.

## 2019-08-11 ENCOUNTER — Telehealth: Payer: Self-pay | Admitting: Nurse Practitioner

## 2019-08-11 ENCOUNTER — Other Ambulatory Visit: Payer: Self-pay | Admitting: Nurse Practitioner

## 2019-08-11 MED ORDER — HYDROXYZINE HCL 25 MG PO TABS: ORAL_TABLET | ORAL | 0 refills | Status: DC

## 2019-08-11 NOTE — Telephone Encounter (Signed)
Patient was seen 10/30 for physical and you discuss at her appointment about medication to help her sleep and she would like to try the hydroxyzine you suggested to help her sleep called into [facility]

## 2019-08-11 NOTE — Telephone Encounter (Signed)
Please advise. Thank you

## 2019-08-21 ENCOUNTER — Ambulatory Visit (INDEPENDENT_AMBULATORY_CARE_PROVIDER_SITE_OTHER): Payer: Medicare Other | Admitting: Family Medicine

## 2019-08-21 ENCOUNTER — Encounter: Payer: Self-pay | Admitting: Family Medicine

## 2019-08-21 DIAGNOSIS — J019 Acute sinusitis, unspecified: Secondary | ICD-10-CM | POA: Diagnosis not present

## 2019-08-21 DIAGNOSIS — Z20822 Contact with and (suspected) exposure to covid-19: Secondary | ICD-10-CM

## 2019-08-21 DIAGNOSIS — Z20828 Contact with and (suspected) exposure to other viral communicable diseases: Secondary | ICD-10-CM

## 2019-08-21 DIAGNOSIS — B9689 Other specified bacterial agents as the cause of diseases classified elsewhere: Secondary | ICD-10-CM | POA: Diagnosis not present

## 2019-08-21 MED ORDER — AMOXICILLIN-POT CLAVULANATE 875-125 MG PO TABS: 1.0000 | ORAL_TABLET | Freq: Two times a day (BID) | ORAL | 0 refills | Status: DC

## 2019-08-21 NOTE — Progress Notes (Signed)
   Subjective:  Audio only  Patient ID: Jessica Kirby, female    DOB: Oct 24, 1955, 63 y.o.   MRN: [id number]  HPIstarted feeling bad Saturday night.  Sinus congestion draining in back of throat. Pt called nurse line and was told to drink hot liquids. Very little dry cough. No fever.  Virtual Visit via Telephone Note  I connected with Dustin Flock on 08/21/19 at  9:30 AM EST by telephone and verified that I am speaking with the correct person using two identifiers.  Location: Patient: home Provider: office   I discussed the limitations, risks, security and privacy concerns of performing an evaluation and management service by telephone and the availability of in person appointments. I also discussed with the patient that there may be a patient responsible charge related to this service. The patient expressed understanding and agreed to proceed.   History of Present Illness:    Observations/Objective:   Assessment and Plan:   Follow Up Instructions:    I discussed the assessment and treatment plan with the patient. The patient was provided an opportunity to ask questions and all were answered. The patient agreed with the plan and demonstrated an understanding of the instructions.   The patient was advised to call back or seek an in-person evaluation if the symptoms worsen or if the condition fails to improve as anticipated.  I provided 34minutes of non-face-to-face time during this encounter.   Sat night developed sig drainage   Got sig congestion and drainage  Clearing the throart a lot   Drinking hot liquids  dranage sig   Some cough  Energy not as good as usual  Appetite so so      Review of Systems No headache, no major weight loss or weight gain, no chest pain no back pain abdominal pain no change in bowel habits complete ROS otherwise negative     Objective:   Physical Exam  Virtual      Assessment & Plan:  Impression possible  rhinosinusitis.  However have to be concerned about suspected COVID-19.  Rationale discussed.  Antibiotics prescribed.  Symptom care discussed.  COVID-19 testing advised

## 2019-08-22 ENCOUNTER — Other Ambulatory Visit: Payer: Self-pay | Admitting: *Deleted

## 2019-08-22 DIAGNOSIS — Z20822 Contact with and (suspected) exposure to covid-19: Secondary | ICD-10-CM

## 2019-08-24 LAB — NOVEL CORONAVIRUS, NAA: SARS-CoV-2, NAA: NOT DETECTED

## 2019-08-25 ENCOUNTER — Telehealth: Payer: Self-pay | Admitting: Family Medicine

## 2019-08-25 NOTE — Telephone Encounter (Signed)
COVID results are in but not resulted. Please advise. Thank you

## 2019-08-25 NOTE — Telephone Encounter (Signed)
Pt contacted and verbalized understanding.  

## 2019-08-25 NOTE — Telephone Encounter (Signed)
Pt checking to see if COVID results are in

## 2019-08-25 NOTE — Telephone Encounter (Signed)
negative

## 2019-08-29 ENCOUNTER — Telehealth: Payer: Self-pay | Admitting: Family Medicine

## 2019-08-29 DIAGNOSIS — R7301 Impaired fasting glucose: Secondary | ICD-10-CM

## 2019-08-29 DIAGNOSIS — I10 Essential (primary) hypertension: Secondary | ICD-10-CM

## 2019-08-29 NOTE — Telephone Encounter (Signed)
Pt would like her potassium checked and any other lab work Dr. Richardson Landry feels she needs.

## 2019-08-29 NOTE — Telephone Encounter (Signed)
Last labs 03/22/19 tsh, bmp, liver, lipid

## 2019-08-29 NOTE — Telephone Encounter (Signed)
Blood work ordered in [employer].Telephone call voice mail full to notify patient.

## 2019-08-29 NOTE — Telephone Encounter (Signed)
Met 7 A1c

## 2019-08-30 DIAGNOSIS — I10 Essential (primary) hypertension: Secondary | ICD-10-CM | POA: Diagnosis not present

## 2019-08-30 DIAGNOSIS — R7301 Impaired fasting glucose: Secondary | ICD-10-CM | POA: Diagnosis not present

## 2019-08-30 NOTE — Telephone Encounter (Signed)
Pt.notified

## 2019-08-31 LAB — BASIC METABOLIC PANEL
BUN/Creatinine Ratio: 13 (ref 12–28)
BUN: 9 mg/dL (ref 8–27)
CO2: 23 mmol/L (ref 20–29)
Calcium: 9.5 mg/dL (ref 8.7–10.3)
Chloride: 105 mmol/L (ref 96–106)
Creatinine, Ser: 0.68 mg/dL (ref 0.57–1.00)
GFR calc Af Amer: 106 mL/min/{1.73_m2} (ref >59)
GFR calc non Af Amer: 92 mL/min/{1.73_m2} (ref >59)
Glucose: 111 mg/dL — ABNORMAL HIGH (ref 65–99)
Potassium: 4 mmol/L (ref 3.5–5.2)
Sodium: 144 mmol/L (ref 134–144)

## 2019-08-31 LAB — HEMOGLOBIN A1C
Est. average glucose Bld gHb Est-mCnc: 120 mg/dL
Hgb A1c MFr Bld: 5.8 % — ABNORMAL HIGH (ref 4.8–5.6)

## 2019-09-04 ENCOUNTER — Telehealth: Payer: Self-pay | Admitting: Family Medicine

## 2019-09-04 NOTE — Telephone Encounter (Signed)
Pt calling checking on lab results.  

## 2019-09-04 NOTE — Telephone Encounter (Signed)
Patient advised per Dr Richardson Landry: Kidney function good, A1c confirms mild prediabetes, no further med rx at this time, work on cutting down sugars in diet. Patient verbalized understanding.

## 2019-09-04 NOTE — Telephone Encounter (Signed)
Kidney function good, A1c confirms mild prediabetes, no further med rx at this time, work on cutting down sugars in diet

## 2019-10-04 ENCOUNTER — Other Ambulatory Visit: Payer: Self-pay | Admitting: Family Medicine

## 2019-10-04 ENCOUNTER — Ambulatory Visit (INDEPENDENT_AMBULATORY_CARE_PROVIDER_SITE_OTHER): Payer: Medicare Other | Admitting: Family Medicine

## 2019-10-04 DIAGNOSIS — I10 Essential (primary) hypertension: Secondary | ICD-10-CM | POA: Diagnosis not present

## 2019-10-04 MED ORDER — ALPRAZOLAM 0.5 MG PO TABS: 0.5000 mg | ORAL_TABLET | Freq: Every evening | ORAL | 5 refills | Status: DC | PRN

## 2019-10-04 MED ORDER — POTASSIUM CHLORIDE CRYS ER 20 MEQ PO TBCR: EXTENDED_RELEASE_TABLET | ORAL | 1 refills | Status: DC

## 2019-10-04 MED ORDER — AMLODIPINE BESYLATE 5 MG PO TABS: 5.0000 mg | ORAL_TABLET | Freq: Every day | ORAL | 1 refills | Status: DC

## 2019-10-04 MED ORDER — INDAPAMIDE 2.5 MG PO TABS: ORAL_TABLET | ORAL | 1 refills | Status: DC

## 2019-10-04 MED ORDER — FAMOTIDINE 20 MG PO TABS: 20.0000 mg | ORAL_TABLET | Freq: Two times a day (BID) | ORAL | 1 refills | Status: DC

## 2019-10-04 NOTE — Progress Notes (Signed)
   Subjective:    Patient ID: Jessica Kirby, female    DOB: 1956-03-08, 64 y.o.   MRN: [id number]  Pt states her bp has been running normal. Has not checked lately.   Hypertension This is a chronic problem. There are no compliance problems (watches diet, does some exercise, takes med every day).    Pt wants to discuss switching allergy meds. Takes zyrtec now and has tried xyzal.   Takes xanax for anxiety. Gad 7 done.   Virtual Visit via Telephone Note  I connected with Jessica Kirby on 10/04/19 at  8:30 AM EST by telephone and verified that I am speaking with the correct person using two identifiers.  Location: Patient: home Provider: office   I discussed the limitations, risks, security and privacy concerns of performing an evaluation and management service by telephone and the availability of in person appointments. I also discussed with the patient that there may be a patient responsible charge related to this service. The patient expressed understanding and agreed to proceed.   History of Present Illness:    Observations/Objective:   Assessment and Plan:   Follow Up Instructions:    I discussed the assessment and treatment plan with the patient. The patient was provided an opportunity to ask questions and all were answered. The patient agreed with the plan and demonstrated an understanding of the instructions.   The patient was advised to call back or seek an in-person evaluation if the symptoms worsen or if the condition fails to improve as anticipated.  20 minutes of non-face-to-face time during this encounter.       Review of Systems No headache, no major weight loss or weight gain, no chest pain no back pain abdominal pain no change in bowel habits complete ROS otherwise negative     Objective:   Physical Exam  Virtual      Assessment & Plan:  Impression 1 hypertension.  Apparent good control.  Compliance discussed medications to maintain and  refill  2.  Generalized anxiety disorder.  Ongoing.  With definite need for medications refilled.  Diet exercise discussed  6 months worth given

## 2019-10-13 ENCOUNTER — Telehealth: Payer: Self-pay | Admitting: Nurse Practitioner

## 2019-10-13 ENCOUNTER — Other Ambulatory Visit: Payer: Self-pay | Admitting: Nurse Practitioner

## 2019-10-13 MED ORDER — PANTOPRAZOLE SODIUM 40 MG PO TBEC: 40.0000 mg | DELAYED_RELEASE_TABLET | Freq: Every day | ORAL | 2 refills | Status: DC

## 2019-10-13 NOTE — Telephone Encounter (Signed)
Patient notified per Jessica Kirby: Yes. Stop generic Pepcid. Will send in different medication. Call back in 2-3 weeks if no improvement. Watch caffeine intake Limit spicy, greasy foods or citrus such as tomatoes and tomato sauce Avoid eating within 3 hours of going to bed.  Patient verbalized understanding.

## 2019-10-13 NOTE — Telephone Encounter (Signed)
Patient said her reflux medicine isn't really working anymore. Has started taking 2 a day but is still having a lot of reflux, especially at night. Wanted to know if there was something stronger she could take. (no available appts today)  [facility]

## 2019-10-13 NOTE — Telephone Encounter (Signed)
Yes. Stop generic Pepcid. Will send in different medication. Call back in 2-3 weeks if no improvement. Watch caffeine intake Limit spicy, greasy foods or citrus such as tomatoes and tomato sauce Avoid eating within 3 hours of going to bed. Thanks.

## 2019-11-02 ENCOUNTER — Encounter: Payer: Self-pay | Admitting: Family Medicine

## 2019-12-30 ENCOUNTER — Encounter: Payer: Self-pay | Admitting: Emergency Medicine

## 2019-12-30 ENCOUNTER — Ambulatory Visit
Admission: EM | Admit: 2019-12-30 | Discharge: 2019-12-30 | Disposition: A | Discharge status: Home / Self Care | Payer: Medicare Other | Attending: Emergency Medicine | Admitting: Emergency Medicine

## 2019-12-30 ENCOUNTER — Other Ambulatory Visit: Payer: Self-pay

## 2019-12-30 DIAGNOSIS — B9789 Other viral agents as the cause of diseases classified elsewhere: Secondary | ICD-10-CM

## 2019-12-30 DIAGNOSIS — R0981 Nasal congestion: Secondary | ICD-10-CM

## 2019-12-30 DIAGNOSIS — J329 Chronic sinusitis, unspecified: Secondary | ICD-10-CM

## 2019-12-30 MED ORDER — CETIRIZINE HCL 10 MG PO TABS: 10.0000 mg | ORAL_TABLET | Freq: Every day | ORAL | 0 refills | Status: DC

## 2019-12-30 NOTE — ED Triage Notes (Signed)
Started yesterday.  Right sided pressure in head and ear.  Flonase she has used. Little congested. Has had her 2nd covid vaccine last Wednesday.

## 2019-12-30 NOTE — ED Provider Notes (Signed)
[facility]   [id number] 12/30/19 Arrival Time: 53   CC: Sinus congestion  SUBJECTIVE: History from: patient.  Jessica Kirby is a 64 y.o. female who presents with abrupt onset of RT sided nasal congestion, sinus pressure, and RT ear pressure x 1 day.  Denies sick exposure to COVID, flu or strep.  Denies recent travel.  Has tried OTC flonase without relief.  Reports previous symptoms in the past with sinus issues.   Denies fever, chills, fatigue, rhinorrhea, sore throat, SOB, wheezing, chest pain, nausea, changes in bowel or bladder habits.    Received both covid vaccinations  ROS: As per HPI.  All other pertinent ROS negative.     Past Medical History:  Diagnosis Date  . Allergy   . Anxiety    Insomnia  . Depression    Insomnia  . GERD (gastroesophageal reflux disease)   . Herniated disc   . Hypertension   . Hypothyroidism    not medicated at this time  . Osteoporosis   . Thyroid disease    Hypothyroidism   Past Surgical History:  Procedure Laterality Date  . ABDOMINAL HYSTERECTOMY    . COLONOSCOPY N/A 04/07/2016   Procedure: COLONOSCOPY;  Surgeon: Daneil Dolin, MD;  Location: [facility];  Service: Endoscopy;  Laterality: N/A;  8:30 AM  . TUBAL LIGATION    . WRIST SURGERY     Allergies  Allergen Reactions  . Fosamax [Alendronate Sodium] Other (See Comments)    GI Headache  . Vioxx [Rofecoxib]     Per patient  . Losartan Cough  . Sulfa Antibiotics Hives   No current facility-administered medications on file prior to encounter.   Current Outpatient Medications on File Prior to Encounter  Medication Sig Dispense Refill  . ALPRAZolam (XANAX) 0.5 MG tablet Take 1 tablet (0.5 mg total) by mouth at bedtime as needed for anxiety. 30 tablet 5  . amLODipine (NORVASC) 5 MG tablet Take 1 tablet (5 mg total) by mouth daily. 90 tablet 1  . aspirin EC 81 MG tablet Take 81 mg by mouth 2 (two) times a week.    . fluticasone (FLONASE) 50 MCG/ACT nasal spray  SHAKE LIQUID AND USE 2 SPRAYS IN EACH NOSTRIL DAILY 16 g 5  . hydrOXYzine (ATARAX/VISTARIL) 25 MG tablet Take one tab po qhs prn sleep 30 tablet 0  . indapamide (LOZOL) 2.5 MG tablet Take one po q am 90 tablet 1  . loratadine (CLARITIN) 10 MG tablet Take 10 mg by mouth daily.    . pantoprazole (PROTONIX) 40 MG tablet Take 1 tablet (40 mg total) by mouth daily. Prn reflux 30 tablet 2  . potassium chloride SA (KLOR-CON) 20 MEQ tablet TAKE 3 TABLETS(60 MEQ) BY MOUTH DAILY 270 tablet 1   Social History   Socioeconomic History  . Marital status: Married    Spouse name: Not on file  . Number of children: Not on file  . Years of education: Not on file  . Highest education level: Not on file  Occupational History  . Not on file  Tobacco Use  . Smoking status: Passive Smoke Exposure - Never Smoker  . Smokeless tobacco: Never Used  Substance and Sexual Activity  . Alcohol use: No    Alcohol/week: 0.0 standard drinks  . Drug use: No  . Sexual activity: Yes  Other Topics Concern  . Not on file  Social History Narrative  . Not on file   Social Determinants of Health   Financial  Resource Strain:   . Difficulty of Paying Living Expenses:   Food Insecurity:   . Worried About Programme researcher, broadcasting/film/video in the Last Year:   . Barista in the Last Year:   Transportation Needs:   . Freight forwarder (Medical):   Marland Kitchen Lack of Transportation (Non-Medical):   Physical Activity:   . Days of Exercise per Week:   . Minutes of Exercise per Session:   Stress:   . Feeling of Stress :   Social Connections:   . Frequency of Communication with Friends and Family:   . Frequency of Social Gatherings with Friends and Family:   . Attends Religious Services:   . Active Member of Clubs or Organizations:   . Attends Banker Meetings:   Marland Kitchen Marital Status:   Intimate Partner Violence:   . Fear of Current or Ex-Partner:   . Emotionally Abused:   Marland Kitchen Physically Abused:   . Sexually Abused:      Family History  Problem Relation Age of Onset  . Diabetes Father   . Colon cancer Neg Hx     OBJECTIVE:  Vitals:   12/30/19 1231  BP: (!) 178/73  Pulse: 96  Resp: 18  Temp: 97.9 F (36.6 C)  TempSrc: Oral  SpO2: 98%     General appearance: alert; mild fatigue, nontoxic; speaking in full sentences and tolerating own secretions HEENT: NCAT; Ears: EACs clear, TMs pearly gray; Eyes: PERRL.  EOM grossly intact. Nose: nares patent without rhinorrhea, Throat: oropharynx clear, tonsils non erythematous or enlarged, uvula midline  Neck: supple without LAD Lungs: unlabored respirations, symmetrical air entry; cough: absent; no respiratory distress; CTAB Heart: regular rate and rhythm. Skin: warm and dry Psychological: alert and cooperative; normal mood and affect  ASSESSMENT & PLAN:  1. Viral sinusitis   2. Nasal congestion     Meds ordered this encounter  Medications  . cetirizine (ZYRTEC) 10 MG tablet    Sig: Take 1 tablet (10 mg total) by mouth daily.    Dispense:  30 tablet    Refill:  0    Order Specific Question:   Supervising Provider    Answer:   Eustace Moore [[id number]]   Declines COVID test today Get plenty of rest and push fluids Zyrtec prescribed take as directed Continue with flonase Follow up with PCP if symptoms persist Return or go to ER if you have any new or worsening symptoms fever, chills, nausea, vomiting, runny nose, ear pain, cough, sore throat, worsening symptoms despite treatment, etc...  Blood pressure elevated in office.  Please recheck in 24 hours.  If it continues to be greater than 140/90 please follow up with PCP for further evaluation and management.    Reviewed expectations re: course of current medical issues. Questions answered. Outlined signs and symptoms indicating need for more acute intervention. Patient verbalized understanding. After Visit Summary given.         Rennis Harding, PA-C 12/30/19 1250

## 2019-12-30 NOTE — Discharge Instructions (Signed)
Declines COVID test today Get plenty of rest and push fluids Zyrtec prescribed take as directed Continue with flonase Follow up with PCP if symptoms persist Return or go to ER if you have any new or worsening symptoms fever, chills, nausea, vomiting, runny nose, ear pain, cough, sore throat, worsening symptoms despite treatment, etc...  Blood pressure elevated in office.  Please recheck in 24 hours.  If it continues to be greater than 140/90 please follow up with PCP for further evaluation and management.

## 2020-01-01 ENCOUNTER — Ambulatory Visit (INDEPENDENT_AMBULATORY_CARE_PROVIDER_SITE_OTHER): Payer: Medicare Other | Admitting: Family Medicine

## 2020-01-01 ENCOUNTER — Other Ambulatory Visit: Payer: Self-pay

## 2020-01-01 VITALS — BP 142/84

## 2020-01-01 DIAGNOSIS — I10 Essential (primary) hypertension: Secondary | ICD-10-CM

## 2020-01-01 DIAGNOSIS — J310 Chronic rhinitis: Secondary | ICD-10-CM | POA: Diagnosis not present

## 2020-01-01 DIAGNOSIS — J329 Chronic sinusitis, unspecified: Secondary | ICD-10-CM | POA: Diagnosis not present

## 2020-01-01 MED ORDER — CEPHALEXIN 500 MG PO CAPS: 500.0000 mg | ORAL_CAPSULE | Freq: Three times a day (TID) | ORAL | 0 refills | Status: AC

## 2020-01-01 NOTE — Progress Notes (Signed)
   Subjective:    Patient ID: Jessica Kirby, female    DOB: November 27, 1955, 64 y.o.   MRN: [id number]  HPI Patient comes in today for a knot on the back of her head. Patient states she just noticed it last night and it seems to be getting bigger and harder.  Patient states she did have a bad headache last night but tylenol cleared it up.   Patient notes facial congestion and stuffiness.  Some drainage and irritated throat  Also a bump on the back of the neck painful to her  Also blood pressures were substantially elevated when she went to urgent care last week with systolic in neighborhood of 170.  Claims complete compliance with medication Review of Systems No chest pain no shortness of breath no fevers    Objective:   Physical Exam  Alert active good hydration TMs normal pharynx normal some nasal congestion posterior neck small inflamed sebaceous cyst.  Lungs clear.  Heart regular rate and rhythm.  Blood pressure decent on repeat      Assessment & Plan:  Impression 1 infected skin structure.  Sebaceous cyst with inflammation and infection.  Also element of sinusitis.  Will cover with a round of antibiotics.  Rationale discussed  2.  Hypertension blood pressure improved on repeat maintain same meds  Of note some of the symptoms may be residual from Covid shot #2 taken a day before she developed symptoms

## 2020-01-05 ENCOUNTER — Telehealth: Payer: Self-pay | Admitting: Family Medicine

## 2020-01-05 DIAGNOSIS — I10 Essential (primary) hypertension: Secondary | ICD-10-CM

## 2020-01-05 NOTE — Telephone Encounter (Signed)
Patient takes potassium pills and wonders if she needs to have her potassium checked?

## 2020-01-08 ENCOUNTER — Other Ambulatory Visit: Payer: Self-pay | Admitting: Nurse Practitioner

## 2020-01-08 NOTE — Telephone Encounter (Signed)
Checked in December and perfect, only if she wants to order met 7 if she wants

## 2020-01-08 NOTE — Telephone Encounter (Signed)
Patient states she takes 3 potassium pills a day and just wants to be safe. Blood work ordered in [employer].

## 2020-01-09 DIAGNOSIS — I10 Essential (primary) hypertension: Secondary | ICD-10-CM | POA: Diagnosis not present

## 2020-01-10 ENCOUNTER — Telehealth: Payer: Self-pay | Admitting: Family Medicine

## 2020-01-10 LAB — BASIC METABOLIC PANEL
BUN/Creatinine Ratio: 15 (ref 12–28)
BUN: 12 mg/dL (ref 8–27)
CO2: 24 mmol/L (ref 20–29)
Calcium: 9.6 mg/dL (ref 8.7–10.3)
Chloride: 105 mmol/L (ref 96–106)
Creatinine, Ser: 0.78 mg/dL (ref 0.57–1.00)
GFR calc Af Amer: 92 mL/min/{1.73_m2} (ref >59)
GFR calc non Af Amer: 80 mL/min/{1.73_m2} (ref >59)
Glucose: 95 mg/dL (ref 65–99)
Potassium: 4.1 mmol/L (ref 3.5–5.2)
Sodium: 145 mmol/L — ABNORMAL HIGH (ref 134–144)

## 2020-01-10 NOTE — Telephone Encounter (Signed)
Patient is requesting results of labs 

## 2020-01-29 DIAGNOSIS — M79671 Pain in right foot: Secondary | ICD-10-CM | POA: Diagnosis not present

## 2020-01-29 DIAGNOSIS — M79674 Pain in right toe(s): Secondary | ICD-10-CM | POA: Diagnosis not present

## 2020-01-29 DIAGNOSIS — L03031 Cellulitis of right toe: Secondary | ICD-10-CM | POA: Diagnosis not present

## 2020-01-29 DIAGNOSIS — L600 Ingrowing nail: Secondary | ICD-10-CM | POA: Diagnosis not present

## 2020-02-12 DIAGNOSIS — M79672 Pain in left foot: Secondary | ICD-10-CM | POA: Diagnosis not present

## 2020-02-12 DIAGNOSIS — L03032 Cellulitis of left toe: Secondary | ICD-10-CM | POA: Diagnosis not present

## 2020-02-12 DIAGNOSIS — L600 Ingrowing nail: Secondary | ICD-10-CM | POA: Diagnosis not present

## 2020-02-12 DIAGNOSIS — M79675 Pain in left toe(s): Secondary | ICD-10-CM | POA: Diagnosis not present

## 2020-02-16 ENCOUNTER — Ambulatory Visit (INDEPENDENT_AMBULATORY_CARE_PROVIDER_SITE_OTHER): Payer: Medicare Other | Admitting: Nurse Practitioner

## 2020-02-16 ENCOUNTER — Other Ambulatory Visit: Payer: Self-pay

## 2020-02-16 VITALS — BP 140/82 | Temp 97.0°F | Ht 62.0 in | Wt 225.6 lb

## 2020-02-16 DIAGNOSIS — E559 Vitamin D deficiency, unspecified: Secondary | ICD-10-CM | POA: Insufficient documentation

## 2020-02-16 DIAGNOSIS — R5383 Other fatigue: Secondary | ICD-10-CM

## 2020-02-16 DIAGNOSIS — R351 Nocturia: Secondary | ICD-10-CM

## 2020-02-16 DIAGNOSIS — R350 Frequency of micturition: Secondary | ICD-10-CM

## 2020-02-16 DIAGNOSIS — R3581 Nocturnal polyuria: Secondary | ICD-10-CM

## 2020-02-16 LAB — POCT URINALYSIS DIPSTICK
Spec Grav, UA: 1.015 (ref 1.010–1.025)
pH, UA: 6 (ref 5.0–8.0)

## 2020-02-16 NOTE — Progress Notes (Signed)
Subjective:    Patient ID: Jessica Kirby, female    DOB: [birth year], 64 y.o.   MRN: [id number]  HPI  Patient arrives to discuss ongoing fatigue- worse for last several weeks. Patient states she is not sleeping well at night. Also c/o urinary urgency and frequency. Has been going on for a long time, worse over the past 2-3 months. For the past couple of weeks, gets up 4 x per night. Has been taking antihistamines for her allergies.  No dysuria, pelvic pain. No flank or back pain. No enuresis.  Has also been struggling with social isolation related to COVID and the loss of her husband.  Depression screen [facility] 2/9 02/16/2020 07/28/2019 10/24/2018 07/19/2018 08/16/2017  Decreased Interest 2 1 0 2 0  Down, Depressed, Hopeless 1 1 0 3 0  PHQ - 2 Score 3 2 0 5 0  Altered sleeping _0 0  Tired, decreased energy _1 Change in appetite _2 Feeling bad or failure about yourself  0 0 0 0 0  Trouble concentrating 0 0 0 2 0  Moving slowly or fidgety/restless 2 2 0 0 0  Suicidal thoughts 0 0 0 0 0  PHQ-9 Score _3 Difficult doing work/chores Not difficult at all Not difficult at all Somewhat difficult Very difficult Not difficult at all   GAD 7 : Generalized Anxiety Score 02/16/2020 10/04/2019  Nervous, Anxious, on Edge 1 2  Control/stop worrying 0 0  Worry too much - different things 1 0  Trouble relaxing 2 0  Restless 2 1  Easily annoyed or irritable 0 0  Afraid - awful might happen 0 0  Total GAD 7 Score 6 3  Anxiety Difficulty Not difficult at all Not difficult at all       Review of Systems     Objective:   Physical Exam NAD. Alert, oriented. Calm affect. Tearful at times. Lungs clear. Heart RRR. Abdomen soft, no lower abdominal tenderness. No CVA tenderness to percussion.  UA: neg.  See Met 7 01/09/20: Fasting glucose 95. Sodium 145. Potassium 4.1. 08/29/20: A1C 5.8.     Assessment & Plan:   Problem List Items Addressed This Visit      Other   Vitamin D deficiency   Relevant Orders   VITAMIN D 25 Hydroxy (Vit-D Deficiency, Fractures) (Completed)    Other Visit Diagnoses    Fatigue, unspecified type    -  Primary   Relevant Orders   TSH (Completed)   Hepatic function panel (Completed)   Urinary frequency       Relevant Orders   POCT urinalysis dipstick (Completed)   Nocturnal polyuria         Labs pending. Hold on Hydroxyzine and Zyrtec to avoid possible urinary side effects. Further follow up based on lab results. Consider antidepressant at that time.

## 2020-02-16 NOTE — Patient Instructions (Signed)
Stop Hydroxyzine and Zyrtec

## 2020-02-17 ENCOUNTER — Encounter: Payer: Self-pay | Admitting: Nurse Practitioner

## 2020-02-17 LAB — HEPATIC FUNCTION PANEL
ALT: 14 IU/L (ref 0–32)
AST: 18 IU/L (ref 0–40)
Albumin: 4.4 g/dL (ref 3.8–4.8)
Alkaline Phosphatase: 91 IU/L (ref 48–121)
Bilirubin Total: 0.4 mg/dL (ref 0.0–1.2)
Bilirubin, Direct: 0.11 mg/dL (ref 0.00–0.40)
Total Protein: 7.2 g/dL (ref 6.0–8.5)

## 2020-02-17 LAB — TSH: TSH: 2.99 u[IU]/mL (ref 0.450–4.500)

## 2020-02-17 LAB — VITAMIN D 25 HYDROXY (VIT D DEFICIENCY, FRACTURES): Vit D, 25-Hydroxy: 26.9 ng/mL — ABNORMAL LOW (ref 30.0–100.0)

## 2020-02-19 ENCOUNTER — Telehealth: Payer: Self-pay | Admitting: *Deleted

## 2020-02-19 ENCOUNTER — Telehealth: Payer: Self-pay | Admitting: Family Medicine

## 2020-02-19 ENCOUNTER — Ambulatory Visit
Admission: EM | Admit: 2020-02-19 | Discharge: 2020-02-19 | Disposition: A | Discharge status: Home / Self Care | Payer: Medicare Other | Attending: Emergency Medicine | Admitting: Emergency Medicine

## 2020-02-19 ENCOUNTER — Other Ambulatory Visit: Payer: Self-pay | Admitting: Nurse Practitioner

## 2020-02-19 ENCOUNTER — Other Ambulatory Visit: Payer: Self-pay

## 2020-02-19 DIAGNOSIS — N898 Other specified noninflammatory disorders of vagina: Secondary | ICD-10-CM

## 2020-02-19 MED ORDER — ESCITALOPRAM OXALATE 10 MG PO TABS: 10.0000 mg | ORAL_TABLET | Freq: Every day | ORAL | 0 refills | Status: DC

## 2020-02-19 MED ORDER — DOXYCYCLINE HYCLATE 100 MG PO CAPS
100.0000 mg | ORAL_CAPSULE | Freq: Two times a day (BID) | ORAL | 0 refills | Status: DC
Start: 2020-02-19 — End: 2020-03-05

## 2020-02-19 NOTE — Discharge Instructions (Signed)
Apply warm compresses 3-4x daily for 10-15 minutes Wash site daily with warm water and mild soap Keep covered to avoid friction Take antibiotic as prescribed and to completion Follow up here or with PCP if symptoms persists Return or go to the ED if you have any new or worsening symptoms increased redness, swelling, pain, nausea, vomiting, fever, chills, etc...  

## 2020-02-19 NOTE — Telephone Encounter (Signed)
Patient notified and advised not to take antihistamines. Patient would like to hold off on the urology referral.

## 2020-02-19 NOTE — Telephone Encounter (Signed)
Will send this in. Please make sure she stops all antihistamines because of urinary symptoms to see if this will help. Also does she want a referral to urology? Thanks!

## 2020-02-19 NOTE — ED Provider Notes (Signed)
[place]   [id number] 02/19/20 Arrival Time: 2831   CC: ABSCESS  SUBJECTIVE:  Jessica Kirby is a 64 y.o. female who presents with a possible abscess of her vagina x 1 day.  Denies precipitating event or trauma.   Denies alleviating factors.  Sore to the touch.  Reports similar symptoms with infected cyst in the past.  Denies fever, chills, nausea, vomiting, erythema, discharge.    ROS: As per HPI.  All other pertinent ROS negative.     Past Medical History:  Diagnosis Date  . Allergy   . Anxiety    Insomnia  . Depression    Insomnia  . GERD (gastroesophageal reflux disease)   . Herniated disc   . Hypertension   . Hypothyroidism    not medicated at this time  . Osteoporosis   . Thyroid disease    Hypothyroidism   Past Surgical History:  Procedure Laterality Date  . ABDOMINAL HYSTERECTOMY    . COLONOSCOPY N/A 04/07/2016   Procedure: COLONOSCOPY;  Surgeon: Daneil Dolin, MD;  Location: [facility];  Service: Endoscopy;  Laterality: N/A;  8:30 AM  . TUBAL LIGATION    . WRIST SURGERY     Allergies  Allergen Reactions  . Fosamax [Alendronate Sodium] Other (See Comments)    GI Headache  . Vioxx [Rofecoxib]     Per patient  . Losartan Cough  . Sulfa Antibiotics Hives   No current facility-administered medications on file prior to encounter.   Current Outpatient Medications on File Prior to Encounter  Medication Sig Dispense Refill  . ALPRAZolam (XANAX) 0.5 MG tablet Take 1 tablet (0.5 mg total) by mouth at bedtime as needed for anxiety. 30 tablet 5  . amLODipine (NORVASC) 5 MG tablet Take 1 tablet (5 mg total) by mouth daily. 90 tablet 1  . aspirin EC 81 MG tablet Take 81 mg by mouth 2 (two) times a week.    . escitalopram (LEXAPRO) 10 MG tablet Take 1 tablet (10 mg total) by mouth daily. 30 tablet 0  . fluticasone (FLONASE) 50 MCG/ACT nasal spray SHAKE LIQUID AND USE 2 SPRAYS IN EACH NOSTRIL DAILY 16 g 5  . hydrOXYzine (ATARAX/VISTARIL) 25 MG tablet  Take one tab po qhs prn sleep 30 tablet 0  . indapamide (LOZOL) 2.5 MG tablet Take one po q am 90 tablet 1  . loratadine (CLARITIN) 10 MG tablet Take 10 mg by mouth daily.    . pantoprazole (PROTONIX) 40 MG tablet TAKE 1 TABLET(40 MG) BY MOUTH DAILY AS NEEDED FOR REFLUX 30 tablet 2  . potassium chloride SA (KLOR-CON) 20 MEQ tablet TAKE 3 TABLETS(60 MEQ) BY MOUTH DAILY 270 tablet 1   Social History   Socioeconomic History  . Marital status: Married    Spouse name: Not on file  . Number of children: Not on file  . Years of education: Not on file  . Highest education level: Not on file  Occupational History  . Not on file  Tobacco Use  . Smoking status: Passive Smoke Exposure - Never Smoker  . Smokeless tobacco: Never Used  Substance and Sexual Activity  . Alcohol use: No    Alcohol/week: 0.0 standard drinks  . Drug use: No  . Sexual activity: Yes  Other Topics Concern  . Not on file  Social History Narrative  . Not on file   Social Determinants of Health   Financial Resource Strain:   . Difficulty of Paying Living Expenses:   Food  Insecurity:   . Worried About Programme researcher, broadcasting/film/video in the Last Year:   . Barista in the Last Year:   Transportation Needs:   . Freight forwarder (Medical):   Marland Kitchen Lack of Transportation (Non-Medical):   Physical Activity:   . Days of Exercise per Week:   . Minutes of Exercise per Session:   Stress:   . Feeling of Stress :   Social Connections:   . Frequency of Communication with Friends and Family:   . Frequency of Social Gatherings with Friends and Family:   . Attends Religious Services:   . Active Member of Clubs or Organizations:   . Attends Banker Meetings:   Marland Kitchen Marital Status:   Intimate Partner Violence:   . Fear of Current or Ex-Partner:   . Emotionally Abused:   Marland Kitchen Physically Abused:   . Sexually Abused:    Family History  Problem Relation Age of Onset  . Diabetes Father   . Heart attack Mother   .  Colon cancer Neg Hx     OBJECTIVE:  Vitals:   02/19/20 1804  BP: (!) 172/90  Pulse: 99  Resp: 18  Temp: 98.7 F (37.1 C)  TempSrc: Oral  SpO2: 93%     General appearance: alert; no distress HENT: NCAT; EOMI grossly; oropharynx clear CV: RRR Lungs: CTAB Skin: small pustule/ papule in labia minora to the left of clitoris, TTP, no obvious drainage or bleeding Psychological: alert and cooperative; normal mood and affect  ASSESSMENT & PLAN:  1. Vaginal cyst     Meds ordered this encounter  Medications  . doxycycline (VIBRAMYCIN) 100 MG capsule    Sig: Take 1 capsule (100 mg total) by mouth 2 (two) times daily.    Dispense:  20 capsule    Refill:  0    Order Specific Question:   Supervising Provider    Answer:   Eustace Moore [[id number]]   Apply warm compresses 3-4x daily for 10-15 minutes Wash site daily with warm water and mild soap Keep covered to avoid friction Take antibiotic as prescribed and to completion Follow up here or with PCP if symptoms persists Return or go to the ED if you have any new or worsening symptoms increased redness, swelling, pain, nausea, vomiting, fever, chills, etc...    Reviewed expectations re: course of current medical issues. Questions answered. Outlined signs and symptoms indicating need for more acute intervention. Patient verbalized understanding. After Visit Summary given.          Rennis Harding, PA-C 02/19/20 2637

## 2020-02-19 NOTE — Telephone Encounter (Signed)
Noted  

## 2020-02-19 NOTE — Telephone Encounter (Signed)
Discussed lab results with pt. Pt states she is willing to start depression med. [facility] [address] and would like a call back after med is sent to pharm so she will know. Aware carolyn is out of office til friday

## 2020-02-19 NOTE — Telephone Encounter (Signed)
Pt checking on lab results 

## 2020-02-19 NOTE — ED Triage Notes (Signed)
Pt presents with complaints of an abscess on the left side of her groin that she noticed today. Reports spot on the back of her neck that she had to take antibiotics for in around 2 months ago. The area is tender and hard to touch.

## 2020-02-22 DIAGNOSIS — M79672 Pain in left foot: Secondary | ICD-10-CM | POA: Diagnosis not present

## 2020-02-22 DIAGNOSIS — L600 Ingrowing nail: Secondary | ICD-10-CM | POA: Diagnosis not present

## 2020-02-22 DIAGNOSIS — M79675 Pain in left toe(s): Secondary | ICD-10-CM | POA: Diagnosis not present

## 2020-02-22 DIAGNOSIS — L03032 Cellulitis of left toe: Secondary | ICD-10-CM | POA: Diagnosis not present

## 2020-02-27 NOTE — Telephone Encounter (Signed)
error 

## 2020-03-05 ENCOUNTER — Ambulatory Visit (INDEPENDENT_AMBULATORY_CARE_PROVIDER_SITE_OTHER): Payer: Medicare Other | Admitting: Family Medicine

## 2020-03-05 ENCOUNTER — Encounter: Payer: Self-pay | Admitting: Family Medicine

## 2020-03-05 ENCOUNTER — Other Ambulatory Visit: Payer: Self-pay

## 2020-03-05 VITALS — BP 132/86 | HR 84 | Temp 98.0°F | Ht 62.0 in | Wt 224.6 lb

## 2020-03-05 DIAGNOSIS — I10 Essential (primary) hypertension: Secondary | ICD-10-CM

## 2020-03-05 DIAGNOSIS — E876 Hypokalemia: Secondary | ICD-10-CM | POA: Diagnosis not present

## 2020-03-05 DIAGNOSIS — R252 Cramp and spasm: Secondary | ICD-10-CM

## 2020-03-05 MED ORDER — ALPRAZOLAM 0.5 MG PO TABS
0.5000 mg | ORAL_TABLET | Freq: Every evening | ORAL | 0 refills | Status: DC | PRN
Start: 2020-03-05 — End: 2020-09-24

## 2020-03-05 MED ORDER — AMLODIPINE BESYLATE 5 MG PO TABS: 5.0000 mg | ORAL_TABLET | Freq: Every day | ORAL | 1 refills | Status: DC

## 2020-03-05 NOTE — Progress Notes (Signed)
Patient ID: Jessica Kirby, female    DOB: [birth year], 64 y.o.   MRN: [id number]   Chief Complaint  Patient presents with  . leg cramps   Subjective:    HPI  Seen for- cramping in legs yesterday and tingling in hands.  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  No new movements with rt hand.  No trauma or injury.  Cramping in both legs in calf. When woke up yesterday.  H/o hypokalemia- Taking 3 potassium per day. Intermittent numbness and resolved on it's own.   Didn't take meds for this.  No new exercising or walking with legs.  Numbness/tingling in rt lower leg.  husband passed 2 yrs ago.  Numb at bottom of rt foot. Woke up today and resolved.  Moved more and raised legs up.  Last visit given lexapro 10mg , but made her feel sweaty and clammy and HA's. So dc it. After 2 wks dc.  Has xanax 0.5mg  and only using prn. Pt stating she is wanting to discontinue this.  Not having to take as much. Only taking 1x per week at this time.  Taking indapamide prn, not taking daily. Taking 2x per week. Taking diet drink 1x per day used to take 4-5 per day. Has decreased.   Also asking for refill on amlodipine.  Medical History Jessica Kirby has a past medical history of Allergy, Anxiety, Depression, GERD (gastroesophageal reflux disease), Herniated disc, Hypertension, Hypothyroidism, Osteoporosis, and Thyroid disease.   Outpatient Encounter Medications as of 03/05/2020  Medication Sig  . amLODipine (NORVASC) 5 MG tablet Take 1 tablet (5 mg total) by mouth daily.  05/05/2020 aspirin EC 81 MG tablet Take 81 mg by mouth 2 (two) times a week.  . fluticasone (FLONASE) 50 MCG/ACT nasal spray SHAKE LIQUID AND USE 2 SPRAYS IN EACH NOSTRIL DAILY  . indapamide (LOZOL) 2.5 MG tablet Take one po q am  . loratadine (CLARITIN) 10 MG tablet Take 10 mg by mouth daily.  . pantoprazole (PROTONIX) 40 MG tablet TAKE 1 TABLET(40 MG) BY MOUTH DAILY AS NEEDED FOR REFLUX  . potassium chloride SA (KLOR-CON) 20 MEQ  tablet TAKE 3 TABLETS(60 MEQ) BY MOUTH DAILY  . [DISCONTINUED] ALPRAZolam (XANAX) 0.5 MG tablet Take 1 tablet (0.5 mg total) by mouth at bedtime as needed for anxiety.  . [DISCONTINUED] amLODipine (NORVASC) 5 MG tablet Take 1 tablet (5 mg total) by mouth daily.  Marland Kitchen ALPRAZolam (XANAX) 0.5 MG tablet Take 1 tablet (0.5 mg total) by mouth at bedtime as needed for anxiety.  . [DISCONTINUED] doxycycline (VIBRAMYCIN) 100 MG capsule Take 1 capsule (100 mg total) by mouth 2 (two) times daily.  . [DISCONTINUED] escitalopram (LEXAPRO) 10 MG tablet Take 1 tablet (10 mg total) by mouth daily.  . [DISCONTINUED] hydrOXYzine (ATARAX/VISTARIL) 25 MG tablet Take one tab po qhs prn sleep   No facility-administered encounter medications on file as of 03/05/2020.     Review of Systems  Constitutional: Negative for chills and fever.  Musculoskeletal: Positive for arthralgias (rt fingers). Negative for back pain, gait problem, joint swelling and myalgias.       +finger swelling, right. +bilateral leg cramping-resolved.  Skin: Negative for rash.  Neurological: Positive for numbness (rt lower leg, resolved).     Vitals BP 132/86   Pulse 84   Temp 98 F (36.7 C)   Ht 5\' 2"  (1.575 m)   Wt 224 lb 9.6 oz (101.9 kg)   BMI 41.08 kg/m   Objective:   Physical Exam  Vitals and nursing note reviewed.  Constitutional:      General: She is not in acute distress.    Appearance: Normal appearance. She is not ill-appearing.  HENT:     Head: Normocephalic and atraumatic.  Cardiovascular:     Rate and Rhythm: Normal rate and regular rhythm.     Pulses: Normal pulses.     Heart sounds: Normal heart sounds.  Pulmonary:     Effort: Pulmonary effort is normal.     Breath sounds: Normal breath sounds. No wheezing, rhonchi or rales.  Musculoskeletal:        General: Swelling (rt 2nd and 3rd dip with mild erythema and swelling, heberdens nodes) and tenderness (fingers on rt, dip) present. Normal range of motion.      Cervical back: Normal range of motion.     Right lower leg: No edema.     Left lower leg: No edema.     Comments: +LE swelling around ankles, non-pitting.  Neg-homan's sign, no erythema or leg swelling.  Skin:    General: Skin is warm and dry.     Findings: No bruising, erythema, lesion or rash.  Neurological:     General: No focal deficit present.     Mental Status: She is alert and oriented to person, place, and time.     Cranial Nerves: No cranial nerve deficit.     Sensory: No sensory deficit.     Motor: No weakness.     Gait: Gait normal.  Psychiatric:        Mood and Affect: Mood normal.        Behavior: Behavior normal.        Thought Content: Thought content normal.        Judgment: Judgment normal.      Assessment and Plan   1. Bilateral leg cramps - Basic metabolic panel  2. Hypokalemia - Basic metabolic panel  3. Hypertension, unspecified type   Insomnia/anxiety- I changed her xanax to 15 tab per month prn. Was on 30 tab monthly but only taking 1-2 x per week.  Discussed risk vs. Benefit of taking benzodiazepine in pt over 5 yrs old.  Pt voiced understanding and in agreement with tapering off.  Pt will recheck potassium next week if continuing to have leg cramps.  At this time cramping has resolved.   Pt to take tylenol or ibuprofen prn. Use heat/ice/stretches and call or rto if worsening.  HTN- cont meds, suboptimal.  Work on dec salt with diet.  Pt okay with decreasing xanax over time. Not needing nightly at this time.  F/u 56mo or prn.

## 2020-03-05 NOTE — Patient Instructions (Signed)
Sciatica  Sciatica is pain, weakness, tingling, or loss of feeling (numbness) along the sciatic nerve. The sciatic nerve starts in the lower back and goes down the back of each leg. Sciatica usually goes away on its own or with treatment. Sometimes, sciatica may come back (recur). What are the causes? This condition happens when the sciatic nerve is pinched or has pressure put on it. This may be the result of:  A disk in between the bones of the spine bulging out too far (herniated disk).  Changes in the spinal disks that occur with aging.  A condition that affects a muscle in the butt.  Extra bone growth near the sciatic nerve.  A break (fracture) of the area between your hip bones (pelvis).  Pregnancy.  Tumor. This is rare. What increases the risk? You are more likely to develop this condition if you:  Play sports that put pressure or stress on the spine.  Have poor strength and ease of movement (flexibility).  Have had a back injury in the past.  Have had back surgery.  Sit for long periods of time.  Do activities that involve bending or lifting over and over again.  Are very overweight (obese). What are the signs or symptoms? Symptoms can vary from mild to very bad. They may include:  Any of these problems in the lower back, leg, hip, or butt: ? Mild tingling, loss of feeling, or dull aches. ? Burning sensations. ? Sharp pains.  Loss of feeling in the back of the calf or the sole of the foot.  Leg weakness.  Very bad back pain that makes it hard to move. These symptoms may get worse when you cough, sneeze, or laugh. They may also get worse when you sit or stand for long periods of time. How is this treated? This condition often gets better without any treatment. However, treatment may include:  Changing or cutting back on physical activity when you have pain.  Doing exercises and stretching.  Putting ice or heat on the affected area.  Medicines that  help: ? To relieve pain and swelling. ? To relax your muscles.  Shots (injections) of medicines that help to relieve pain, irritation, and swelling.  Surgery. Follow these instructions at home: Medicines  Take over-the-counter and prescription medicines only as told by your doctor.  Ask your doctor if the medicine prescribed to you: ? Requires you to avoid driving or using heavy machinery. ? Can cause trouble pooping (constipation). You may need to take these steps to prevent or treat trouble pooping:  Drink enough fluids to keep your pee (urine) pale yellow.  Take over-the-counter or prescription medicines.  Eat foods that are high in fiber. These include beans, whole grains, and fresh fruits and vegetables.  Limit foods that are high in fat and sugar. These include fried or sweet foods. Managing pain      If told, put ice on the affected area. ? Put ice in a plastic bag. ? Place a towel between your skin and the bag. ? Leave the ice on for 20 minutes, 2-3 times a day.  If told, put heat on the affected area. Use the heat source that your doctor tells you to use, such as a moist heat pack or a heating pad. ? Place a towel between your skin and the heat source. ? Leave the heat on for 20-30 minutes. ? Remove the heat if your skin turns bright red. This is very important if you are   unable to feel pain, heat, or cold. You may have a greater risk of getting burned. Activity   Return to your normal activities as told by your doctor. Ask your doctor what activities are safe for you.  Avoid activities that make your symptoms worse.  Take short rests during the day. ? When you rest for a long time, do some physical activity or stretching between periods of rest. ? Avoid sitting for a long time without moving. Get up and move around at least one time each hour.  Exercise and stretch regularly, as told by your doctor.  Do not lift anything that is heavier than 10 lb (4.5 kg)  while you have symptoms of sciatica. ? Avoid lifting heavy things even when you do not have symptoms. ? Avoid lifting heavy things over and over.  When you lift objects, always lift in a way that is safe for your body. To do this, you should: ? Bend your knees. ? Keep the object close to your body. ? Avoid twisting. General instructions  Stay at a healthy weight.  Wear comfortable shoes that support your feet. Avoid wearing high heels.  Avoid sleeping on a mattress that is too soft or too hard. You might have less pain if you sleep on a mattress that is firm enough to support your back.  Keep all follow-up visits as told by your doctor. This is important. Contact a doctor if:  You have pain that: ? Wakes you up when you are sleeping. ? Gets worse when you lie down. ? Is worse than the pain you have had in the past. ? Lasts longer than 4 weeks.  You lose weight without trying. Get help right away if:  You cannot control when you pee (urinate) or poop (have a bowel movement).  You have weakness in any of these areas and it gets worse: ? Lower back. ? The area between your hip bones. ? Butt. ? Legs.  You have redness or swelling of your back.  You have a burning feeling when you pee. Summary  Sciatica is pain, weakness, tingling, or loss of feeling (numbness) along the sciatic nerve.  This condition happens when the sciatic nerve is pinched or has pressure put on it.  Sciatica can cause pain, tingling, or loss of feeling (numbness) in the lower back, legs, hips, and butt.  Treatment often includes rest, exercise, medicines, and putting ice or heat on the affected area. This information is not intended to replace advice given to you by your health care provider. Make sure you discuss any questions you have with your health care provider. Document Revised: 10/03/2018 Document Reviewed: 10/03/2018 Elsevier Patient Education  2020 [employer]  

## 2020-03-06 ENCOUNTER — Ambulatory Visit: Payer: Medicare Other | Admitting: Family Medicine

## 2020-03-11 DIAGNOSIS — L03032 Cellulitis of left toe: Secondary | ICD-10-CM | POA: Diagnosis not present

## 2020-03-11 DIAGNOSIS — M79675 Pain in left toe(s): Secondary | ICD-10-CM | POA: Diagnosis not present

## 2020-03-11 DIAGNOSIS — M79672 Pain in left foot: Secondary | ICD-10-CM | POA: Diagnosis not present

## 2020-03-11 DIAGNOSIS — L600 Ingrowing nail: Secondary | ICD-10-CM | POA: Diagnosis not present

## 2020-03-14 DIAGNOSIS — E876 Hypokalemia: Secondary | ICD-10-CM | POA: Diagnosis not present

## 2020-03-14 DIAGNOSIS — R252 Cramp and spasm: Secondary | ICD-10-CM | POA: Diagnosis not present

## 2020-03-15 ENCOUNTER — Telehealth: Payer: Self-pay | Admitting: Family Medicine

## 2020-03-15 ENCOUNTER — Encounter (HOSPITAL_COMMUNITY): Payer: Self-pay

## 2020-03-15 ENCOUNTER — Other Ambulatory Visit: Payer: Self-pay

## 2020-03-15 ENCOUNTER — Emergency Department (HOSPITAL_COMMUNITY): Payer: Medicare Other

## 2020-03-15 ENCOUNTER — Emergency Department (HOSPITAL_COMMUNITY)
Admission: EM | Admit: 2020-03-15 | Discharge: 2020-03-15 | Disposition: A | Discharge status: Home / Self Care | Payer: Medicare Other | Attending: Emergency Medicine | Admitting: Emergency Medicine

## 2020-03-15 DIAGNOSIS — Z79899 Other long term (current) drug therapy: Secondary | ICD-10-CM | POA: Insufficient documentation

## 2020-03-15 DIAGNOSIS — Z7722 Contact with and (suspected) exposure to environmental tobacco smoke (acute) (chronic): Secondary | ICD-10-CM | POA: Diagnosis not present

## 2020-03-15 DIAGNOSIS — I517 Cardiomegaly: Secondary | ICD-10-CM | POA: Diagnosis not present

## 2020-03-15 DIAGNOSIS — R6889 Other general symptoms and signs: Secondary | ICD-10-CM | POA: Diagnosis not present

## 2020-03-15 DIAGNOSIS — Z743 Need for continuous supervision: Secondary | ICD-10-CM | POA: Diagnosis not present

## 2020-03-15 DIAGNOSIS — R52 Pain, unspecified: Secondary | ICD-10-CM | POA: Diagnosis not present

## 2020-03-15 DIAGNOSIS — I10 Essential (primary) hypertension: Secondary | ICD-10-CM | POA: Insufficient documentation

## 2020-03-15 DIAGNOSIS — Z7982 Long term (current) use of aspirin: Secondary | ICD-10-CM | POA: Insufficient documentation

## 2020-03-15 DIAGNOSIS — R42 Dizziness and giddiness: Secondary | ICD-10-CM | POA: Insufficient documentation

## 2020-03-15 DIAGNOSIS — H81399 Other peripheral vertigo, unspecified ear: Secondary | ICD-10-CM | POA: Diagnosis not present

## 2020-03-15 DIAGNOSIS — R079 Chest pain, unspecified: Secondary | ICD-10-CM | POA: Diagnosis not present

## 2020-03-15 DIAGNOSIS — R404 Transient alteration of awareness: Secondary | ICD-10-CM | POA: Diagnosis not present

## 2020-03-15 DIAGNOSIS — R0689 Other abnormalities of breathing: Secondary | ICD-10-CM | POA: Diagnosis not present

## 2020-03-15 LAB — CBC WITH DIFFERENTIAL/PLATELET
Abs Immature Granulocytes: 0.06 10*3/uL (ref 0.00–0.07)
Basophils Absolute: 0.1 10*3/uL (ref 0.0–0.1)
Basophils Relative: 1 %
Eosinophils Absolute: 0.2 10*3/uL (ref 0.0–0.5)
Eosinophils Relative: 2 %
HCT: 40.7 % (ref 36.0–46.0)
Hemoglobin: 13 g/dL (ref 12.0–15.0)
Immature Granulocytes: 1 %
Lymphocytes Relative: 19 %
Lymphs Abs: 1.8 10*3/uL (ref 0.7–4.0)
MCH: 28 pg (ref 26.0–34.0)
MCHC: 31.9 g/dL (ref 30.0–36.0)
MCV: 87.5 fL (ref 80.0–100.0)
Monocytes Absolute: 0.4 10*3/uL (ref 0.1–1.0)
Monocytes Relative: 4 %
Neutro Abs: 6.7 10*3/uL (ref 1.7–7.7)
Neutrophils Relative %: 73 %
Platelets: 265 10*3/uL (ref 150–400)
RBC: 4.65 MIL/uL (ref 3.87–5.11)
RDW: 12.2 % (ref 11.5–15.5)
WBC: 9.2 10*3/uL (ref 4.0–10.5)
nRBC: 0 % (ref 0.0–0.2)

## 2020-03-15 LAB — BASIC METABOLIC PANEL
Anion gap: 14 (ref 5–15)
BUN/Creatinine Ratio: 17 (ref 12–28)
BUN: 12 mg/dL (ref 8–27)
BUN: 12 mg/dL (ref 8–23)
CO2: 23 mmol/L (ref 20–29)
CO2: 26 mmol/L (ref 22–32)
Calcium: 9.4 mg/dL (ref 8.7–10.3)
Calcium: 8.6 mg/dL — ABNORMAL LOW (ref 8.9–10.3)
Chloride: 106 mmol/L (ref 96–106)
Chloride: 100 mmol/L (ref 98–111)
Creatinine, Ser: 0.71 mg/dL (ref 0.57–1.00)
Creatinine, Ser: 0.68 mg/dL (ref 0.44–1.00)
GFR calc Af Amer: 103 mL/min/{1.73_m2} (ref >59)
GFR calc Af Amer: >60 mL/min (ref >60)
GFR calc non Af Amer: 90 mL/min/{1.73_m2} (ref >59)
GFR calc non Af Amer: >60 mL/min (ref >60)
Glucose, Bld: 154 mg/dL — ABNORMAL HIGH (ref 70–99)
Glucose: 98 mg/dL (ref 65–99)
Potassium: 4.6 mmol/L (ref 3.5–5.2)
Potassium: 3.4 mmol/L — ABNORMAL LOW (ref 3.5–5.1)
Sodium: 144 mmol/L (ref 134–144)
Sodium: 140 mmol/L (ref 135–145)

## 2020-03-15 LAB — TROPONIN I (HIGH SENSITIVITY)
Troponin I (High Sensitivity): 2 ng/L (ref <18)
Troponin I (High Sensitivity): 2 ng/L (ref <18)

## 2020-03-15 MED ORDER — MECLIZINE HCL 25 MG PO TABS
25.0000 mg | ORAL_TABLET | Freq: Three times a day (TID) | ORAL | 0 refills | Status: DC | PRN
Start: 2020-03-15 — End: 2021-10-09

## 2020-03-15 MED ORDER — FUROSEMIDE 10 MG/ML IJ SOLN
40.0000 mg | Freq: Once | INTRAMUSCULAR | Status: DC
  Filled 2020-03-15: qty 4

## 2020-03-15 MED ORDER — ONDANSETRON 4 MG PO TBDP: ORAL_TABLET | ORAL | 0 refills | Status: DC

## 2020-03-15 MED ORDER — SODIUM CHLORIDE 0.9 % IV BOLUS
1000.0000 mL | Freq: Once | INTRAVENOUS | Status: AC
  Administered 2020-03-15: 1000 mL via INTRAVENOUS

## 2020-03-15 MED ORDER — ONDANSETRON HCL 4 MG/2ML IJ SOLN
4.0000 mg | Freq: Once | INTRAMUSCULAR | Status: AC
  Administered 2020-03-15: 4 mg via INTRAVENOUS
  Filled 2020-03-15: qty 2

## 2020-03-15 MED ORDER — MECLIZINE HCL 12.5 MG PO TABS
25.0000 mg | ORAL_TABLET | Freq: Once | ORAL | Status: AC
  Administered 2020-03-15: 25 mg via ORAL
  Filled 2020-03-15: qty 2

## 2020-03-15 NOTE — ED Notes (Signed)
Pt family member states that pt cannot get Zofran OTD filled at [facility] due to insurance. Requesting alternative medication. Marlise Eves, MD.

## 2020-03-15 NOTE — Telephone Encounter (Signed)
All results were normal and potassium normal.  Advising to try 400mg  of magnesium otc daily for leg cramping, to see if this will help.   Take care,   Dr. 

## 2020-03-15 NOTE — Discharge Instructions (Signed)
Drink plenty of fluids and rest over the weekend.  Follow-up with your doctor next week.  Take the Antivert as needed

## 2020-03-15 NOTE — Telephone Encounter (Signed)
Patient would like results of labs.

## 2020-03-15 NOTE — ED Triage Notes (Addendum)
EMS reports pt took her medication this morning and ate breakfast.  Reports ate at approx 0930 she started having dizziness, nausea, and became diaphoretic.  Also reports pain in left arm and chest. Reports felt like indigestion.   EMS gave 4mg  zofran PTA.  Denies headache, reports room spinning.  CBG 166.  12 lead showed Sinus with PAC's per ems.  Pt vomiting upon arrival.  EMS says pt became diaphoretic and dizzy again with ems and reports pt's hr was 59.  Reports bradycardia lasted approx 30 sec.  Pt also reports she took 1 baby asa this morning when she started to feel bad.

## 2020-03-15 NOTE — ED Notes (Signed)
Pt ambulated well with assistance. Pt states that she felt a whole lot better than before when she tried to get up to go to the bathroom. Pt states that she is not nauseated like normal and did not feel that dizzy.

## 2020-03-15 NOTE — ED Provider Notes (Signed)
[facility] EMERGENCY DEPARTMENT Provider Note   CSN: [id number] Arrival date & time: 03/15/20  1126     History Chief Complaint  Patient presents with  . Dizziness    Jessica Kirby is a 64 y.o. female.  Patient complains of some vertigo symptoms.  Had some nausea  The history is provided by the patient and a significant other. No language interpreter was used.  Dizziness Quality:  Head spinning Severity:  Mild Onset quality:  Sudden Timing:  Constant Progression:  Worsening Chronicity:  New Context: bending over   Relieved by:  Nothing Worsened by:  Nothing Associated symptoms: no chest pain, no diarrhea and no headaches        Past Medical History:  Diagnosis Date  . Allergy   . Anxiety    Insomnia  . Depression    Insomnia  . GERD (gastroesophageal reflux disease)   . Herniated disc   . Hypertension   . Hypothyroidism    not medicated at this time  . Osteoporosis   . Thyroid disease    Hypothyroidism    Patient Active Problem List   Diagnosis Date Noted  . Vitamin D deficiency 02/16/2020  . Diverticulosis of colon without hemorrhage   . Anxiety 11/04/2015  . Primary osteoarthritis of both hands 11/04/2015  . Chest pain 07/20/2015  . Hypokalemia 07/20/2015  . Pain in the chest   . Esophageal reflux   . Morbid obesity due to excess calories (HCC)   . Persistent cough 07/04/2015  . GERD (gastroesophageal reflux disease) 09/27/2013  . Hypothyroidism 12/24/2012  . 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 Hearing decreased 12/24/2012  . Depression with anxiety 12/24/2012  . Hypertension   . Osteopenia     Past Surgical History:  Procedure Laterality Date  . ABDOMINAL HYSTERECTOMY    . COLONOSCOPY N/A 04/07/2016   Procedure: COLONOSCOPY;  Surgeon: Corbin Ade, MD;  Location: [facility];  Service: Endoscopy;  Laterality: N/A;  8:30 AM  . TUBAL LIGATION    . WRIST SURGERY       OB History   No obstetric history on file.     Family History   Problem Relation Age of Onset  . Diabetes Father   . Heart attack Mother   . Colon cancer Neg Hx     Social History   Tobacco Use  . Smoking status: Passive Smoke Exposure - Never Smoker  . Smokeless tobacco: Never Used  Substance Use Topics  . Alcohol use: No    Alcohol/week: 0.0 standard drinks  . Drug use: No    Home Medications Prior to Admission medications   Medication Sig Start Date End Date Taking? Authorizing Provider  ALPRAZolam Prudy Feeler) 0.5 MG tablet Take 1 tablet (0.5 mg total) by mouth at bedtime as needed for anxiety. 03/05/20  Yes Ladona Ridgel, Malena M, DO  amLODipine (NORVASC) 5 MG tablet Take 1 tablet (5 mg total) by mouth daily. 03/05/20  Yes Ladona Ridgel, Malena M, DO  aspirin EC 81 MG tablet Take 81 mg by mouth daily.    Yes [provider]  famotidine (PEPCID) 40 MG tablet Take 40 mg by mouth daily.   Yes [provider]  fluticasone (FLONASE) 50 MCG/ACT nasal spray SHAKE LIQUID AND USE 2 SPRAYS IN EACH NOSTRIL DAILY Patient taking differently: Place 2 sprays into both nostrils daily. SHAKE LIQUID AND USE 2 SPRAYS IN EACH NOSTRIL DAILY 10/04/19  Yes Merlyn Albert, MD  indapamide (LOZOL) 2.5 MG tablet Take one po  q am Patient taking differently: Take 2.5 mg by mouth daily. Take one po q am 10/04/19  Yes Merlyn Albert, MD  loratadine (CLARITIN) 10 MG tablet Take 10 mg by mouth daily as needed for allergies.    Yes [provider]  pantoprazole (PROTONIX) 40 MG tablet TAKE 1 TABLET(40 MG) BY MOUTH DAILY AS NEEDED FOR REFLUX Patient taking differently: Take 40 mg by mouth daily as needed.  01/08/20  Yes Campbell Riches, NP  potassium chloride SA (KLOR-CON) 20 MEQ tablet TAKE 3 TABLETS(60 MEQ) BY MOUTH DAILY Patient taking differently: Take 60 mEq by mouth daily. TAKE 3 TABLETS(60 MEQ) BY MOUTH DAILY 10/04/19  Yes Merlyn Albert, MD    Allergies    Fosamax [alendronate sodium], Vioxx [rofecoxib], Losartan, and Sulfa antibiotics  Review of  Systems   Review of Systems  Constitutional: Negative for appetite change and fatigue.  HENT: Negative for congestion, ear discharge and sinus pressure.   Eyes: Negative for discharge.  Respiratory: Negative for cough.   Cardiovascular: Negative for chest pain.  Gastrointestinal: Negative for abdominal pain and diarrhea.  Genitourinary: Negative for frequency and hematuria.  Musculoskeletal: Negative for back pain.  Skin: Negative for rash.  Neurological: Positive for dizziness. Negative for seizures and headaches.  Psychiatric/Behavioral: Negative for hallucinations.    Physical Exam Updated Vital Signs BP 134/74   Pulse 87   Temp 97.9 F (36.6 C) (Oral)   Resp 17   Ht 5\' 2"  (1.575 m)   Wt 99.8 kg   SpO2 98%   BMI 40.24 kg/m   Physical Exam Vitals and nursing note reviewed.  Constitutional:      Appearance: She is well-developed.  HENT:     Head: Normocephalic.     Nose: Nose normal.  Eyes:     General: No scleral icterus.    Conjunctiva/sclera: Conjunctivae normal.  Neck:     Thyroid: No thyromegaly.  Cardiovascular:     Rate and Rhythm: Normal rate and regular rhythm.     Heart sounds: No murmur heard.  No friction rub. No gallop.   Pulmonary:     Breath sounds: No stridor. No wheezing or rales.  Chest:     Chest wall: No tenderness.  Abdominal:     General: There is no distension.     Tenderness: There is no abdominal tenderness. There is no rebound.  Musculoskeletal:        General: Normal range of motion.     Cervical back: Neck supple.  Lymphadenopathy:     Cervical: No cervical adenopathy.  Skin:    Findings: No erythema or rash.  Neurological:     Mental Status: She is alert and oriented to person, place, and time.     Motor: No abnormal muscle tone.     Coordination: Coordination normal.  Psychiatric:        Behavior: Behavior normal.     ED Results / Procedures / Treatments   Labs (all labs ordered are listed, but only abnormal results  are displayed) Labs Reviewed  BASIC METABOLIC PANEL - Abnormal; Notable for the following components:      Result Value   Potassium 3.4 (*)    Glucose, Bld 154 (*)    Calcium 8.6 (*)    All other components within normal limits  CBC WITH DIFFERENTIAL/PLATELET  TROPONIN I (HIGH SENSITIVITY)  TROPONIN I (HIGH SENSITIVITY)    EKG EKG Interpretation  Date/Time:  Friday March 15 2020 11:33:52 EDT Ventricular Rate:  89 PR Interval:    QRS Duration: 109 QT Interval:  397 QTC Calculation: 484 R Axis:   57 Text Interpretation: Sinus rhythm Atrial premature complexes Consider left atrial enlargement Borderline repolarization abnormality Confirmed by Milton Ferguson [phone]) on 03/15/2020 1:04:44 PM   Radiology CT Head Wo Contrast  Result Date: 03/15/2020 CLINICAL DATA:  Dizziness, ataxia EXAM: CT HEAD WITHOUT CONTRAST TECHNIQUE: Contiguous axial images were obtained from the base of the skull through the vertex without intravenous contrast. COMPARISON:  07/09/2011 FINDINGS: Brain: No evidence of acute infarction, hemorrhage, hydrocephalus, extra-axial collection or mass lesion/mass effect. Vascular: No hyperdense vessel or unexpected calcification. Skull: Normal. Negative for fracture or focal lesion. Sinuses/Orbits: No acute finding. Other: None. IMPRESSION: No acute intracranial findings. Electronically Signed   By: Davina Poke D.O.   On: 03/15/2020 13:11   DG Chest Portable 1 View  Result Date: 03/15/2020 CLINICAL DATA:  Chest pain EXAM: PORTABLE CHEST 1 VIEW COMPARISON:  July 19, 2015 FINDINGS: Stable cardiomegaly. The hila and mediastinum are normal. No pneumothorax. No nodules or masses. No focal infiltrates. No overt edema. IMPRESSION: No active disease. Electronically Signed   By: Dorise Bullion III M.D   On: 03/15/2020 12:18    Procedures Procedures (including critical care time)  Medications Ordered in ED Medications  sodium chloride 0.9 % bolus 1,000 mL (0 mLs  Intravenous Stopped 03/15/20 1544)  ondansetron (ZOFRAN) injection 4 mg (4 mg Intravenous Given 03/15/20 1304)  meclizine (ANTIVERT) tablet 25 mg (25 mg Oral Given 03/15/20 1544)    ED Course  I have reviewed the triage vital signs and the nursing notes.  Pertinent labs & imaging results that were available during my care of the patient were reviewed by me and considered in my medical decision making (see chart for details).    MDM Rules/Calculators/A&P                          Patient with vertigo symptoms.  Patient has unremarkable CBC chemistries chest x-ray EKG and CT head.  Patient will be discharged with Antivert and Zofran and follow-up with PCP        This patient presents to the ED for concern of dizziness this involves an extensive number of treatment options, and is a complaint that carries with it a high risk of complications and morbidity.  The differential diagnosis includes vertigo stroke   Lab Tests:   I Ordered, reviewed, and interpreted labs, which included CBC chemistries unremarkable  Medicines ordered:   I ordered medication Antivert for dizziness  Imaging Studies ordered:   I ordered imaging studies which included CT head and  I independently visualized and interpreted imaging which showed unremarkable  Additional history obtained:   Additional history obtained from friend  Previous records obtained and reviewed.  Consultations Obtained:   Reevaluation:  After the interventions stated above, I reevaluated the patient and found improvement  Critical Interventions:  .   Final Clinical Impression(s) / ED Diagnoses Final diagnoses:  None    Rx / DC Orders ED Discharge Orders    None       Milton Ferguson, MD 03/18/20 1142

## 2020-03-15 NOTE — Telephone Encounter (Signed)
Left message to return call 

## 2020-03-15 NOTE — Telephone Encounter (Signed)
Pt has BMET completed yesterday. Please advise. Thank you

## 2020-03-15 NOTE — Telephone Encounter (Signed)
Pt returned call and verbalized understanding.   FYI Pt is currently at the ER. Pt states she becomes dizzy when she wakes up and she began to feel clammy. EMS took pt to [facility]. Pt had CT scan done.

## 2020-03-15 NOTE — ED Notes (Signed)
Pt family member thanked Engineering geologist. Reports being frustrated about wait in ER.

## 2020-03-16 ENCOUNTER — Telehealth (HOSPITAL_COMMUNITY): Payer: Self-pay | Admitting: Emergency Medicine

## 2020-03-16 MED ORDER — ONDANSETRON HCL 4 MG PO TABS: 4.0000 mg | ORAL_TABLET | Freq: Four times a day (QID) | ORAL | 0 refills | Status: DC

## 2020-03-16 NOTE — Telephone Encounter (Cosign Needed)
Original rx changed from quantity of 12 to 24 d/t insurance requirement

## 2020-03-18 NOTE — Progress Notes (Signed)
Patient notified of results, she feels it is getting better and will see you Friday.

## 2020-03-22 ENCOUNTER — Encounter: Payer: Self-pay | Admitting: Family Medicine

## 2020-03-22 ENCOUNTER — Ambulatory Visit (INDEPENDENT_AMBULATORY_CARE_PROVIDER_SITE_OTHER): Payer: Medicare Other | Admitting: Family Medicine

## 2020-03-22 ENCOUNTER — Other Ambulatory Visit: Payer: Self-pay

## 2020-03-22 VITALS — BP 126/86 | HR 68 | Temp 97.5°F | Ht 62.0 in | Wt 223.8 lb

## 2020-03-22 DIAGNOSIS — R42 Dizziness and giddiness: Secondary | ICD-10-CM

## 2020-03-22 DIAGNOSIS — K219 Gastro-esophageal reflux disease without esophagitis: Secondary | ICD-10-CM

## 2020-03-22 DIAGNOSIS — R252 Cramp and spasm: Secondary | ICD-10-CM | POA: Diagnosis not present

## 2020-03-22 DIAGNOSIS — E876 Hypokalemia: Secondary | ICD-10-CM | POA: Diagnosis not present

## 2020-03-22 DIAGNOSIS — F419 Anxiety disorder, unspecified: Secondary | ICD-10-CM

## 2020-03-22 MED ORDER — INDAPAMIDE 2.5 MG PO TABS: ORAL_TABLET | ORAL | 1 refills | Status: DC

## 2020-03-22 NOTE — Progress Notes (Signed)
Patient ID: STEPHENIE Kirby, female    DOB: [birth year], 64 y.o.   MRN: [id number]   Chief Complaint  Patient presents with   Hospitalization Follow-up    vertigo/dizziness- patient feelin better   Subjective:    HPI Last week was in ER for vertigo. On 03/15/20.    Felt nausea and hard to sit up without feeling needing to vomit.  Ems had to come get her she was having sweating, feeling of nausea and wnating to vomit. Some acid came up. No headache.  Pt unable to open eyes due to the spinning feeling. Not work in bending over or squating.  No preceding events.  In ER received fluids, zofran, meclazine. Taking indapamide 2x per week, which was written to take daily for leg swelling, and taking 62meq kcl 3x per day. Taking protonix for reflux.  Vertigo- improving, and took 1-2 the next day. And one zofran. Vertigo has gotten better as week gone on.  For leg cramping started Magnesium 250 mg on last visit.  Hasn't noticed if it helps, thinking she may need to increase it.  Jerrye Bushy- Having lots of reflux at night with laying down. Taking pepcid and protonix.  Taking pepcid only prn. Taking at night due to laying down and feeling refluxing. Some times taking kcl all at once and wasn't always taking with food.  At night taking it and felt reflux was worse.  Now taking kcl tid thoroughout the day. About 8hrs apart.   Medical History [place] has a past medical history of Allergy, Anxiety, Depression, GERD (gastroesophageal reflux disease), Herniated disc, Hypertension, Hypothyroidism, Osteoporosis, and Thyroid disease.   Outpatient Encounter Medications as of 03/22/2020  Medication Sig   ALPRAZolam (XANAX) 0.5 MG tablet Take 1 tablet (0.5 mg total) by mouth at bedtime as needed for anxiety.   amLODipine (NORVASC) 5 MG tablet Take 1 tablet (5 mg total) by mouth daily.   aspirin EC 81 MG tablet Take 81 mg by mouth daily.    famotidine (PEPCID) 40 MG tablet Take 40 mg by mouth daily.    fluticasone (FLONASE) 50 MCG/ACT nasal spray SHAKE LIQUID AND USE 2 SPRAYS IN EACH NOSTRIL DAILY (Patient taking differently: Place 2 sprays into both nostrils daily. SHAKE LIQUID AND USE 2 SPRAYS IN EACH NOSTRIL DAILY)   indapamide (LOZOL) 2.5 MG tablet Take one po q am Monday and Fridays.   loratadine (CLARITIN) 10 MG tablet Take 10 mg by mouth daily as needed for allergies.    meclizine (ANTIVERT) 25 MG tablet Take 1 tablet (25 mg total) by mouth 3 (three) times daily as needed for dizziness.   ondansetron (ZOFRAN ODT) 4 MG disintegrating tablet 4mg  ODT q4 hours prn nausea/vomit   pantoprazole (PROTONIX) 40 MG tablet TAKE 1 TABLET(40 MG) BY MOUTH DAILY AS NEEDED FOR REFLUX (Patient taking differently: Take 40 mg by mouth daily as needed. )   potassium chloride SA (KLOR-CON) 20 MEQ tablet TAKE 3 TABLETS(60 MEQ) BY MOUTH DAILY (Patient taking differently: Take 60 mEq by mouth daily. TAKE 3 TABLETS(60 MEQ) BY MOUTH DAILY)   [DISCONTINUED] indapamide (LOZOL) 2.5 MG tablet Take one po q am (Patient taking differently: Take 2.5 mg by mouth daily. Take one po q am)   [DISCONTINUED] ondansetron (ZOFRAN) 4 MG tablet Take 1 tablet (4 mg total) by mouth every 6 (six) hours.   No facility-administered encounter medications on file as of 03/22/2020.     Review of Systems  Constitutional: Negative for chills and fever.  HENT: Negative for congestion, rhinorrhea and sore throat.   Respiratory: Negative for cough, shortness of breath and wheezing.   Cardiovascular: Negative for chest pain and leg swelling.  Gastrointestinal: Negative for abdominal pain, diarrhea, nausea and vomiting.       +refluxing  Genitourinary: Negative for dysuria and frequency.  Musculoskeletal: Negative for arthralgias and back pain.  Skin: Negative for rash.  Neurological: Positive for dizziness (improved). Negative for weakness and headaches.     Vitals BP 126/86    Pulse 68    Temp (!) 97.5 F (36.4 C) (Oral)     Ht 5\' 2"  (1.575 m)    Wt 223 lb 12.8 oz (101.5 kg)    SpO2 97%    BMI 40.93 kg/m   Objective:   Physical Exam Vitals and nursing note reviewed.  Constitutional:      General: She is not in acute distress.    Appearance: Normal appearance. She is not ill-appearing.  HENT:     Head: Normocephalic and atraumatic.     Nose: Nose normal.     Mouth/Throat:     Mouth: Mucous membranes are moist.     Pharynx: Oropharynx is clear.  Eyes:     Extraocular Movements: Extraocular movements intact.     Conjunctiva/sclera: Conjunctivae normal.     Pupils: Pupils are equal, round, and reactive to light.  Cardiovascular:     Rate and Rhythm: Normal rate and regular rhythm.     Pulses: Normal pulses.     Heart sounds: Normal heart sounds. No murmur heard.   Pulmonary:     Effort: Pulmonary effort is normal.     Breath sounds: Normal breath sounds. No wheezing, rhonchi or rales.  Musculoskeletal:        General: Normal range of motion.     Right lower leg: No edema.     Left lower leg: No edema.  Skin:    General: Skin is warm and dry.     Findings: No lesion or rash.  Neurological:     General: No focal deficit present.     Mental Status: She is alert and oriented to person, place, and time.     Cranial Nerves: No cranial nerve deficit.     Motor: No weakness.     Gait: Gait normal.  Psychiatric:        Mood and Affect: Mood normal.        Behavior: Behavior normal.      Assessment and Plan   1. Vertigo  2. Leg cramp - Magnesium  3. Hypokalemia - Magnesium - Basic metabolic panel  4. Gastroesophageal reflux disease, unspecified whether esophagitis present  5. Anxiety   Vertigo- improved.  zofran and meclazine prn.  Medication changes-  Hypokalemia- Dec kclor to 1 tab bid with food. Avoid taking it directly before bed, try taking at dinner time with meal. gerd-Cont protonix. Reviewed that protonix can worsen hypokalemia. Avoid pepcid on prn. Take indamamide 2x per  week, reviewed this medication can decrease K also. Cont magnesium or leg cramping.  Will recheck K and Mag in 1 month.  Anxiety- 0.5mg  tablet, not taking currently. Trying not to take it has it prn. Ok to refill 30 tabs prn.  F/u 3 months, and in 1 month for lab work.   Pt in agreement.

## 2020-03-22 NOTE — Patient Instructions (Addendum)
Potassium- kclor to 1 tab 2 x per day with food.  Cont protonix for acid. Avoid pepcid only as needed.  Take indapamide 2x per week. (mon/fridays) Continue the magnesium or leg cramping.  Can go up to 400mg  if needed.   Follow up 1 month to recheck potassium and magnesium

## 2020-03-28 ENCOUNTER — Other Ambulatory Visit: Payer: Self-pay | Admitting: Family Medicine

## 2020-04-08 ENCOUNTER — Other Ambulatory Visit: Payer: Self-pay | Admitting: Nurse Practitioner

## 2020-04-09 NOTE — Telephone Encounter (Signed)
Seen 03/22/20 for reflux

## 2020-04-29 DIAGNOSIS — R252 Cramp and spasm: Secondary | ICD-10-CM | POA: Diagnosis not present

## 2020-04-29 DIAGNOSIS — E876 Hypokalemia: Secondary | ICD-10-CM | POA: Diagnosis not present

## 2020-04-30 ENCOUNTER — Telehealth: Payer: Self-pay | Admitting: Family Medicine

## 2020-04-30 LAB — BASIC METABOLIC PANEL
BUN/Creatinine Ratio: 13 (ref 12–28)
BUN: 10 mg/dL (ref 8–27)
CO2: 26 mmol/L (ref 20–29)
Calcium: 9.5 mg/dL (ref 8.7–10.3)
Chloride: 104 mmol/L (ref 96–106)
Creatinine, Ser: 0.79 mg/dL (ref 0.57–1.00)
GFR calc Af Amer: 90 mL/min/{1.73_m2} (ref >59)
GFR calc non Af Amer: 78 mL/min/{1.73_m2} (ref >59)
Glucose: 126 mg/dL — ABNORMAL HIGH (ref 65–99)
Potassium: 4.5 mmol/L (ref 3.5–5.2)
Sodium: 143 mmol/L (ref 134–144)

## 2020-04-30 LAB — MAGNESIUM: Magnesium: 2.2 mg/dL (ref 1.6–2.3)

## 2020-04-30 NOTE — Telephone Encounter (Signed)
Results discussed with patient. Patient advised per Dr Lorin Picket:    Potassium and magnesium are normal    Patient verbalized understanding.

## 2020-04-30 NOTE — Telephone Encounter (Signed)
Pt calling wanting to know how her potassium lab work turned out.

## 2020-04-30 NOTE — Telephone Encounter (Signed)
Potassium and magnesium are normal.  

## 2020-05-21 ENCOUNTER — Ambulatory Visit (INDEPENDENT_AMBULATORY_CARE_PROVIDER_SITE_OTHER): Payer: Medicare Other | Admitting: Family Medicine

## 2020-05-21 ENCOUNTER — Encounter: Payer: Self-pay | Admitting: Family Medicine

## 2020-05-21 ENCOUNTER — Other Ambulatory Visit: Payer: Self-pay

## 2020-05-21 VITALS — BP 130/82 | HR 93 | Temp 97.0°F | Ht 62.0 in | Wt 229.0 lb

## 2020-05-21 DIAGNOSIS — R300 Dysuria: Secondary | ICD-10-CM | POA: Diagnosis not present

## 2020-05-21 DIAGNOSIS — M545 Low back pain, unspecified: Secondary | ICD-10-CM | POA: Insufficient documentation

## 2020-05-21 LAB — POCT URINALYSIS DIPSTICK
Spec Grav, UA: 1.015 (ref 1.010–1.025)
pH, UA: 6 (ref 5.0–8.0)

## 2020-05-21 NOTE — Patient Instructions (Addendum)
Food journal to see what you are eating and drinking and digestive symptoms you  Are having at the time. Bring to your next appointment with Dr. Ladona Ridgel. Back pain: continue with antiinflammatory medicine (OTC) with food and alternating ice and heating pad 20 minutes at a time every two hours. Stop lifting for two weeks, have son mow the yard this week. Cases of water are too heavy if your back is hurting. Keep drinking lots of water. Walking is good for stress and weight loss.   Chronic Back Pain When back pain lasts longer than 3 months, it is called chronic back pain. Pain may get worse at certain times (flare-ups). There are things you can do at home to manage your pain. Follow these instructions at home: Activity      Avoid bending and other activities that make pain worse.  When standing: ? Keep your upper back and neck straight. ? Keep your shoulders pulled back. ? Avoid slouching.  When sitting: ? Keep your back straight. ? Relax your shoulders. Do not round your shoulders or pull them backward.  Do not sit or stand in one place for long periods of time.  Take short rest breaks during the day. Lying down or standing is usually better than sitting. Resting can help relieve pain.  When sitting or lying down for a long time, do some mild activity or stretching. This will help to prevent stiffness and pain.  Get regular exercise. Ask your doctor what activities are safe for you.  Do not lift anything that is heavier than 10 lb (4.5 kg). To prevent injury when you lift things: ? Bend your knees. ? Keep the weight close to your body. ? Avoid twisting. Managing pain  If told, put ice on the painful area. Your doctor may tell you to use ice for 24-48 hours after a flare-up starts. ? Put ice in a plastic bag. ? Place a towel between your skin and the bag. ? Leave the ice on for 20 minutes, 2-3 times a day.  If told, put heat on the painful area as often as told by your  doctor. Use the heat source that your doctor recommends, such as a moist heat pack or a heating pad. ? Place a towel between your skin and the heat source. ? Leave the heat on for 20-30 minutes. ? Remove the heat if your skin turns bright red. This is especially important if you are unable to feel pain, heat, or cold. You may have a greater risk of getting burned.  Soak in a warm bath. This can help relieve pain.  Take over-the-counter and prescription medicines only as told by your doctor. General instructions  Sleep on a firm mattress. Try lying on your side with your knees slightly bent. If you lie on your back, put a pillow under your knees.  Keep all follow-up visits as told by your doctor. This is important. Contact a doctor if:  You have pain that does not get better with rest or medicine. Get help right away if:  One or both of your arms or legs feel weak.  One or both of your arms or legs lose feeling (numbness).  You have trouble controlling when you poop (bowel movement) or pee (urinate).  You feel sick to your stomach (nauseous).  You throw up (vomit).  You have belly (abdominal) pain.  You have shortness of breath.  You pass out (faint). Summary  When back pain lasts longer than 3  months, it is called chronic back pain.  Pain may get worse at certain times (flare-ups).  Use ice and heat as told by your doctor. Your doctor may tell you to use ice after flare-ups. This information is not intended to replace advice given to you by your health care provider. Make sure you discuss any questions you have with your health care provider. Document Revised: 01/05/2019 Document Reviewed: 04/29/2017 Elsevier Patient Education  2020 [employer].

## 2020-05-21 NOTE — Progress Notes (Addendum)
Patient ID: Jessica Kirby, female    DOB: 19-Sep-1956, 64 y.o.   MRN: [id number]   Chief Complaint  Patient presents with  . Dysuria    for 2-3 days- notice it more in the am   Subjective:    HPI Jessica Kirby presents today with c/o of painful urination in the morning. She thinks this may be related to her potassium supplement. She also says her back has been hurting for one week. Denies fever, weakness, or numbness/tingling.  Results for orders placed or performed in visit on 05/21/20  POCT urinalysis dipstick  Result Value Ref Range   Color, UA     Clarity, UA     Glucose, UA     Bilirubin, UA     Ketones, UA     Spec Grav, UA 1.015 1.010 - 1.025   Blood, UA     pH, UA 6.0 5.0 - 8.0   Protein, UA     Urobilinogen, UA     Nitrite, UA     Leukocytes, UA     Appearance     Odor       Medical History Jessica Kirby has a past medical history of Allergy, Anxiety, Depression, GERD (gastroesophageal reflux disease), Herniated disc, Hypertension, Hypothyroidism, Osteoporosis, and Thyroid disease.   Outpatient Encounter Medications as of 05/21/2020  Medication Sig  . ALPRAZolam (XANAX) 0.5 MG tablet Take 1 tablet (0.5 mg total) by mouth at bedtime as needed for anxiety.  Marland Kitchen amLODipine (NORVASC) 5 MG tablet Take 1 tablet (5 mg total) by mouth daily.  Marland Kitchen aspirin EC 81 MG tablet Take 81 mg by mouth daily.   . famotidine (PEPCID) 40 MG tablet Take 40 mg by mouth daily.  . fluticasone (FLONASE) 50 MCG/ACT nasal spray SHAKE LIQUID AND USE 2 SPRAYS IN EACH NOSTRIL DAILY  . indapamide (LOZOL) 2.5 MG tablet Take one po q am Monday and Fridays.  Marland Kitchen loratadine (CLARITIN) 10 MG tablet Take 10 mg by mouth daily as needed for allergies.   Marland Kitchen meclizine (ANTIVERT) 25 MG tablet Take 1 tablet (25 mg total) by mouth 3 (three) times daily as needed for dizziness.  . ondansetron (ZOFRAN ODT) 4 MG disintegrating tablet 4mg  ODT q4 hours prn nausea/vomit  . pantoprazole (PROTONIX) 40 MG tablet TAKE 1 TABLET(40 MG) BY  MOUTH DAILY AS NEEDED FOR REFLUX  . potassium chloride SA (KLOR-CON) 20 MEQ tablet TAKE 3 TABLETS(60 MEQ) BY MOUTH DAILY   No facility-administered encounter medications on file as of 05/21/2020.     Review of Systems  Constitutional: Negative for chills and fever.  HENT: Negative.   Eyes: Negative.   Respiratory: Negative.   Cardiovascular: Negative.   Gastrointestinal: Negative.   Genitourinary: Positive for dysuria.       Reports with first urination of the morning only  Musculoskeletal: Positive for back pain.       Pain x 1 week. Lifting heavy cases of water and mowing lawn.     Vitals BP 130/82   Pulse 93   Temp (!) 97 F (36.1 C) (Oral)   Ht 5\' 2"  (1.575 m)   Wt 229 lb (103.9 kg)   SpO2 96%   BMI 41.88 kg/m   Objective:   Physical Exam Constitutional:      General: She is not in acute distress.    Appearance: Normal appearance. She is obese.  Cardiovascular:     Rate and Rhythm: Normal rate and regular rhythm.     Heart sounds: Normal  heart sounds.  Pulmonary:     Effort: Pulmonary effort is normal.     Breath sounds: Normal breath sounds.  Neurological:     General: No focal deficit present.     Mental Status: She is alert.     Sensory: No sensory deficit.     Motor: No weakness.     Coordination: Coordination normal.     Gait: Gait normal.     Comments: No radiating pain, no numbness or tingling, no weakness on exam.      Assessment and Plan   1. Dysuria - POCT urinalysis dipstick  2. Acute midline low back pain without sciatica   Urine is negative in office today. Keep up your water intake. Jessica Kirby feels like this morning burning is related to her potassium supplement.  Discussed timing of potassium and she usually takes her second dose around 4pm. She agrees to keeping a food journal to document what and when she eats and how it makes her feel.   Continue with Aleve as you have already been doing for the back pain: take with food. Since you  have had back pain for only one week, we should treat it conservatively, especially since you did not have any concerning signs today in the office : no radiating pain, no numbness, tingling, or weakness. Instructions given for conservative treatment.  Keep food journal to bring back to your appointment with Jessica Kirby.  No lifting heavy things or mowing the yard this week. Start walking for 15 minutes at a time to strengthen your heart and muscles.    Back Pain Assessment Questions:  1) age > 50 years: yes  2) unintentional weight loss: no  3) fever: no  4) night sweats: no  5) pain awakening from sleep: no  6) history of cancer: no  7) what makes it worse: lifting cases of water and mowing the lawn  8) what makes it better: taking Aleve  9) neurological symptoms: none   Weakness/numbness/sensory changes/bowel or bladder changes: no   Falls/gait instability: no   Recent infections: no   Recent or history of IV drug use: no   Recent or history of corticosteroid use: no   Recent or  history of epidural or spinal procedure: no  10) Associated symptoms: pertinent negative: no radiating pain, no weakness, no numbness or tingling  11) How long have you had this pain or symptoms: 1 week. Has not fallen or injured self that she is aware of. Has gained 20 pounds this past year.   Agrees with plan of care discussed today. Understands warning signs to seek further care:  If pain starts to radiate or cause weakness or numbness. Understands to follow-up if symptoms do not improve or if anything changes.  Keep the appointment with Jessica Kirby in September and bring the food journal with you. Return sooner if your back pain worsens or your urinary symptoms continue after increasing your water intake.   Jessica Bodo, FNP-C

## 2020-05-29 ENCOUNTER — Other Ambulatory Visit: Payer: Self-pay | Admitting: Family Medicine

## 2020-05-31 ENCOUNTER — Other Ambulatory Visit: Payer: Self-pay | Admitting: Family Medicine

## 2020-06-13 ENCOUNTER — Ambulatory Visit
Admission: EM | Admit: 2020-06-13 | Discharge: 2020-06-13 | Disposition: A | Discharge status: Home / Self Care | Payer: Medicare Other

## 2020-06-13 ENCOUNTER — Other Ambulatory Visit: Payer: Self-pay

## 2020-06-13 ENCOUNTER — Encounter: Payer: Self-pay | Admitting: Emergency Medicine

## 2020-06-13 ENCOUNTER — Telehealth: Payer: Self-pay | Admitting: *Deleted

## 2020-06-13 DIAGNOSIS — R030 Elevated blood-pressure reading, without diagnosis of hypertension: Secondary | ICD-10-CM | POA: Diagnosis not present

## 2020-06-13 DIAGNOSIS — Z79899 Other long term (current) drug therapy: Secondary | ICD-10-CM | POA: Diagnosis not present

## 2020-06-13 NOTE — Telephone Encounter (Signed)
Yes, pls order repeat bmp and pls have her go today for this, non fasting.  And pt needing to take potassium with food and not to lay down right after taking it.    If having chest pain needs evaluation in ER.   Take care, Dr. Ladona Ridgel

## 2020-06-13 NOTE — ED Provider Notes (Signed)
[facility]   [id number] 06/13/20 Arrival Time: 1719   Chief Complaint  Patient presents with  . Hypertension     SUBJECTIVE: History from: patient.  Jessica Kirby is a 64 y.o. female who presents presents the urgent care for complaint of high blood pressure.  Hx of HTN x for more than 5 years.  Currently taking Norvasc.  States blood pressure on average is 140/80.   Does not have a PCP and was sent here for further evaluation.  Denies HA, vision changes, dizziness, lightheadedness, chest pain, shortness of breath, numbness or tingling in extremities, abdominal pain, changes in bowel or bladder habits.    ROS: As per HPI.  All other pertinent ROS negative.     Past Medical History:  Diagnosis Date  . Allergy   . Anxiety    Insomnia  . Depression    Insomnia  . GERD (gastroesophageal reflux disease)   . Herniated disc   . Hypertension   . Hypothyroidism    not medicated at this time  . Osteoporosis   . Thyroid disease    Hypothyroidism   Past Surgical History:  Procedure Laterality Date  . ABDOMINAL HYSTERECTOMY    . COLONOSCOPY N/A 04/07/2016   Procedure: COLONOSCOPY;  Surgeon: Corbin Ade, MD;  Location: [facility];  Service: Endoscopy;  Laterality: N/A;  8:30 AM  . TUBAL LIGATION    . WRIST SURGERY     Allergies  Allergen Reactions  . Fosamax [Alendronate Sodium] Other (See Comments)    GI Headache  . Vioxx [Rofecoxib]     Per patient  . Losartan Cough  . Sulfa Antibiotics Hives   No current facility-administered medications on file prior to encounter.   Current Outpatient Medications on File Prior to Encounter  Medication Sig Dispense Refill  . ALPRAZolam (XANAX) 0.5 MG tablet Take 1 tablet (0.5 mg total) by mouth at bedtime as needed for anxiety. 15 tablet 0  . amLODipine (NORVASC) 5 MG tablet Take 1 tablet (5 mg total) by mouth daily. 90 tablet 1  . aspirin EC 81 MG tablet Take 81 mg by mouth daily.     . famotidine (PEPCID) 40 MG  tablet Take 40 mg by mouth daily.    . fluticasone (FLONASE) 50 MCG/ACT nasal spray SHAKE LIQUID AND USE 2 SPRAYS IN EACH NOSTRIL DAILY 48 g 0  . indapamide (LOZOL) 2.5 MG tablet Take one po q am Monday and Fridays. 30 tablet 1  . loratadine (CLARITIN) 10 MG tablet Take 10 mg by mouth daily as needed for allergies.     Marland Kitchen meclizine (ANTIVERT) 25 MG tablet Take 1 tablet (25 mg total) by mouth 3 (three) times daily as needed for dizziness. 30 tablet 0  . ondansetron (ZOFRAN ODT) 4 MG disintegrating tablet 4mg  ODT q4 hours prn nausea/vomit 12 tablet 0  . pantoprazole (PROTONIX) 40 MG tablet TAKE 1 TABLET(40 MG) BY MOUTH DAILY AS NEEDED FOR REFLUX 30 tablet 2  . potassium chloride SA (KLOR-CON) 20 MEQ tablet TAKE 3 TABLETS(60 MEQ) BY MOUTH DAILY 270 tablet 1   Social History   Socioeconomic History  . Marital status: Widowed    Spouse name: Not on file  . Number of children: Not on file  . Years of education: Not on file  . Highest education level: Not on file  Occupational History  . Not on file  Tobacco Use  . Smoking status: Passive Smoke Exposure - Never Smoker  . Smokeless tobacco: Never  Used  Substance and Sexual Activity  . Alcohol use: No    Alcohol/week: 0.0 standard drinks  . Drug use: No  . Sexual activity: Yes  Other Topics Concern  . Not on file  Social History Narrative  . Not on file   Social Determinants of Health   Financial Resource Strain:   . Difficulty of Paying Living Expenses: Not on file  Food Insecurity:   . Worried About Programme researcher, broadcasting/film/video in the Last Year: Not on file  . Ran Out of Food in the Last Year: Not on file  Transportation Needs:   . Lack of Transportation (Medical): Not on file  . Lack of Transportation (Non-Medical): Not on file  Physical Activity:   . Days of Exercise per Week: Not on file  . Minutes of Exercise per Session: Not on file  Stress:   . Feeling of Stress : Not on file  Social Connections:   . Frequency of Communication  with Friends and Family: Not on file  . Frequency of Social Gatherings with Friends and Family: Not on file  . Attends Religious Services: Not on file  . Active Member of Clubs or Organizations: Not on file  . Attends Banker Meetings: Not on file  . Marital Status: Not on file  Intimate Partner Violence:   . Fear of Current or Ex-Partner: Not on file  . Emotionally Abused: Not on file  . Physically Abused: Not on file  . Sexually Abused: Not on file   Family History  Problem Relation Age of Onset  . Diabetes Father   . Heart attack Mother   . Colon cancer Neg Hx     OBJECTIVE:  Vitals:   06/13/20 1733 06/13/20 1821  BP: (!) 187/81 (!) 150/88  Pulse: 100   Resp: 16   Temp: 98.2 F (36.8 C)   SpO2: 98%      Physical Exam Vitals and nursing note reviewed.  Constitutional:      General: She is not in acute distress.    Appearance: Normal appearance. She is normal weight. She is not ill-appearing, toxic-appearing or diaphoretic.  HENT:     Head: Normocephalic.  Neck:     Vascular: No carotid bruit.  Cardiovascular:     Rate and Rhythm: Normal rate and regular rhythm.     Pulses: Normal pulses.     Heart sounds: Normal heart sounds. No murmur heard.  No friction rub. No gallop.   Pulmonary:     Effort: Pulmonary effort is normal. No respiratory distress.     Breath sounds: Normal breath sounds. No stridor. No wheezing, rhonchi or rales.  Chest:     Chest wall: No tenderness.  Neurological:     General: No focal deficit present.     Mental Status: She is alert and oriented to person, place, and time.     GCS: GCS eye subscore is 4. GCS verbal subscore is 5. GCS motor subscore is 6.     Cranial Nerves: Cranial nerves are intact.     Sensory: Sensation is intact.     Motor: Motor function is intact.     Coordination: Coordination is intact.     Gait: Gait is intact.     Deep Tendon Reflexes:     Reflex Scores:      Patellar reflexes are 2+ on the  right side and 2+ on the left side.    LABS:  No results found for this or any  previous visit (from the past 24 hour(s)).   ASSESSMENT & PLAN:  1. Elevated blood pressure reading     No orders of the defined types were placed in this encounter.  Patient is stable at discharge.  BP recheck was 150/88.  She has take second dose of home Norvasc 5 mg 1 hour ago.  No other blood pressure medication was given of  Discharge instructions   Please continue to monitor blood pressure at home and keep a log Eat a well balanced diet of fruits, vegetables and lean meats.  Avoid foods high in fat and salt Drink water.  At least half your body weight in ounces Exercise for at least 30 minutes daily Follow-up with PCP Return or go to the ED if you have any new or worsening symptoms such as vision changes, fatigue, dizziness, chest pain, shortness of breath, nausea, swelling in your hands or feet, urinary symptoms, etc....   Reviewed expectations re: course of current medical issues. Questions answered. Outlined signs and symptoms indicating need for more acute intervention. Patient verbalized understanding. After Visit Summary given.         Durward Parcel, FNP 06/13/20 1829

## 2020-06-13 NOTE — Telephone Encounter (Signed)
Discussed with pt  and pt verbalized understanding of all.

## 2020-06-13 NOTE — Telephone Encounter (Signed)
Pt has been on potassium for awhile but states since she has been taking it she has felt jittery and heart beats faster. States she would like to get bw to have potassium level rechecked. States for the last week she has had the powder of the potassium come up in her throat and her nose after taking it. She is taking one bid. Asked if having chest pain and states not right now but had some last night on the left side and when she has it she takes an aspirin.  Advised her she should go to ED. She still wanted note to go back to see if dr taylor would order potassium. I let pt know she should not ignore the chest pain and should have that checked today in the ED. She verbalized understanding.

## 2020-06-13 NOTE — ED Triage Notes (Signed)
Patient states that she feel like her bp is high and takes potassium

## 2020-06-13 NOTE — Discharge Instructions (Addendum)
Please continue to monitor blood pressure at home and keep a log Eat a well balanced diet of fruits, vegetables and lean meats.  Avoid foods high in fat and salt Drink water.  At least half your body weight in ounces Exercise for at least 30 minutes daily Follow-up with PCP Return or go to the ED if you have any new or worsening symptoms such as vision changes, fatigue, dizziness, chest pain, shortness of breath, nausea, swelling in your hands or feet, urinary symptoms, etc..Marland KitchenMarland Kitchen

## 2020-06-13 NOTE — ED Notes (Signed)
Pt reports she took her norvasc 5 mg this morning and took another norvasc 5 mg this evening.

## 2020-06-14 ENCOUNTER — Telehealth: Payer: Self-pay | Admitting: Family Medicine

## 2020-06-14 LAB — BASIC METABOLIC PANEL
BUN/Creatinine Ratio: 11 — ABNORMAL LOW (ref 12–28)
BUN: 8 mg/dL (ref 8–27)
CO2: 27 mmol/L (ref 20–29)
Calcium: 9.4 mg/dL (ref 8.7–10.3)
Chloride: 106 mmol/L (ref 96–106)
Creatinine, Ser: 0.72 mg/dL (ref 0.57–1.00)
GFR calc Af Amer: 101 mL/min/{1.73_m2} (ref >59)
GFR calc non Af Amer: 88 mL/min/{1.73_m2} (ref >59)
Glucose: 97 mg/dL (ref 65–99)
Potassium: 4.5 mmol/L (ref 3.5–5.2)
Sodium: 145 mmol/L — ABNORMAL HIGH (ref 134–144)

## 2020-06-14 NOTE — Telephone Encounter (Signed)
Patient aware of Dr. Roby Lofts notes and recommendations. She verbalized understanding and was transferred to the front to schedule an appointment.

## 2020-06-14 NOTE — Telephone Encounter (Signed)
1.  Nurses there is no true sodium in potassium tablets, the words sodium when it is utilized with potassium as part of the chemical equation but is not true sodium being added to the potassium Her symptomatology she is having I am sorry that she is having it and we are sympathetic But I believe that her symptomatology is coincidental and less likely to be triggered by the potassium With her blood pressure being elevated I would recommend doubling up on amlodipine instead of 5 mg a day I recommend 10 mg a day I also recommend that the patient follow-up with Dr. Ladona Ridgel next week rather than waiting until the following week  Please make sure that the patient has enough amlodipine to double up on her amlodipine until she is able to see Dr. Ladona Ridgel She should bring all of her medicines with her when she sees Dr. Ladona Ridgel

## 2020-06-14 NOTE — Telephone Encounter (Signed)
Pt called to check on lab results Also states she went to [facility] yesterday & she's worried about her blood pressure  Please advise (explained to pt that nurse would contact her about lab results once doctor reviewed them - pt still wanted message sent due to her BP being elevated

## 2020-06-14 NOTE — Telephone Encounter (Signed)
Pt contacted. Pt went to Urgent Care yesterday and blood pressure was 181/81 upon arrival and 150/88 upon departure. Pt has been taking 2 potassium for the past 2 days. Pt had been taking only one potassium daily. After taking 2nd potassium pt states she becomes jittery and nervous. Pt had headache yesterday with headache toward ears and back of neck. Pt also states that after taking 2nd potassium tablet she begins to taste salt in her mouth and has burning in stomach. Pt also states she feels that she has some fluid gathering in legs and head. Blood pressure while on phone with nurse 176/99-pt states she feels fine. Pt also is wanting to know if there is a potassium pill with no sodium in it. Please advise. Thank you

## 2020-06-19 ENCOUNTER — Other Ambulatory Visit: Payer: Self-pay

## 2020-06-19 ENCOUNTER — Encounter: Payer: Self-pay | Admitting: Family Medicine

## 2020-06-19 ENCOUNTER — Ambulatory Visit (INDEPENDENT_AMBULATORY_CARE_PROVIDER_SITE_OTHER): Payer: Medicare Other | Admitting: Family Medicine

## 2020-06-19 VITALS — BP 140/88 | HR 98 | Temp 98.0°F | Ht 62.0 in | Wt 227.6 lb

## 2020-06-19 DIAGNOSIS — I10 Essential (primary) hypertension: Secondary | ICD-10-CM | POA: Diagnosis not present

## 2020-06-19 DIAGNOSIS — K219 Gastro-esophageal reflux disease without esophagitis: Secondary | ICD-10-CM | POA: Diagnosis not present

## 2020-06-19 DIAGNOSIS — E876 Hypokalemia: Secondary | ICD-10-CM | POA: Diagnosis not present

## 2020-06-19 MED ORDER — AMLODIPINE BESYLATE 10 MG PO TABS: 10.0000 mg | ORAL_TABLET | Freq: Every day | ORAL | 1 refills | Status: DC

## 2020-06-19 MED ORDER — POTASSIUM CHLORIDE CRYS ER 20 MEQ PO TBCR: 20.0000 meq | EXTENDED_RELEASE_TABLET | Freq: Once | ORAL | 1 refills | Status: DC

## 2020-06-19 NOTE — Progress Notes (Signed)
Patient ID: Jessica Kirby, female    DOB: 05-May-1956, 64 y.o.   MRN: [id number]   Chief Complaint  Patient presents with  . Hypertension   Subjective:    HPI Pt here for blood pressure check. Pt went to Urgent Care 06/13/20 and BP was elevated. Pt states when she takes potassium she becomes jittery and has indigestion. Also going to bathroom more than normal. Pt states when she takes Potassium she begins to have a burning sensation in chest.   (Eats when taking meds and does not lay down.)  Pt has tried to do more walking and has cut out soft drinks.   Took potassium at 9am, then jittery and had gi upset.  Stayed up no laying down. And eating too.  Afternoon took 2nd one then took at 4pm.  Drinking water.  At night refluxing and burning. Taking protonix and not taking pepid.  Dx in 2016 with low potassium. Has changed some of her habits.  Taking diet drinks 3-4 per day. Wasn't eating a good diet. Taking allergy pills a lot in the past. Went to urgent care due to elevated bp. And bp went up to 180 and brought pt to urgent care. 1 hr prior took 5mg  norvasc.  And dx with elevated bp and low potassium.  ekg in 6/21- showing sinus rhythm, and pac.  Medical History Jessica Kirby has a past medical history of Allergy, Anxiety, Depression, GERD (gastroesophageal reflux disease), Herniated disc, Hypertension, Hypothyroidism, Osteoporosis, and Thyroid disease.   Outpatient Encounter Medications as of 06/19/2020  Medication Sig  . ALPRAZolam (XANAX) 0.5 MG tablet Take 1 tablet (0.5 mg total) by mouth at bedtime as needed for anxiety.  06/21/2020 amLODipine (NORVASC) 10 MG tablet Take 1 tablet (10 mg total) by mouth daily.  Marland Kitchen aspirin EC 81 MG tablet Take 81 mg by mouth daily.   . famotidine (PEPCID) 40 MG tablet Take 40 mg by mouth daily.  . fluticasone (FLONASE) 50 MCG/ACT nasal spray SHAKE LIQUID AND USE 2 SPRAYS IN EACH NOSTRIL DAILY  . indapamide (LOZOL) 2.5 MG tablet Take one po q am Monday and  Fridays.  02-15-1990 loratadine (CLARITIN) 10 MG tablet Take 10 mg by mouth daily as needed for allergies.   Marland Kitchen meclizine (ANTIVERT) 25 MG tablet Take 1 tablet (25 mg total) by mouth 3 (three) times daily as needed for dizziness.  . ondansetron (ZOFRAN ODT) 4 MG disintegrating tablet 4mg  ODT q4 hours prn nausea/vomit  . pantoprazole (PROTONIX) 40 MG tablet TAKE 1 TABLET(40 MG) BY MOUTH DAILY AS NEEDED FOR REFLUX  . potassium chloride SA (KLOR-CON) 20 MEQ tablet Take 1 tablet (20 mEq total) by mouth once for 1 dose.  . [DISCONTINUED] amLODipine (NORVASC) 5 MG tablet Take 1 tablet (5 mg total) by mouth daily.  . [DISCONTINUED] potassium chloride SA (KLOR-CON) 20 MEQ tablet TAKE 3 TABLETS(60 MEQ) BY MOUTH DAILY   No facility-administered encounter medications on file as of 06/19/2020.      Review of Systems  Constitutional: Negative for chills and fever.  HENT: Negative for congestion, rhinorrhea and sore throat.   Respiratory: Negative for cough, shortness of breath and wheezing.   Cardiovascular: Negative for chest pain, palpitations and leg swelling.  Gastrointestinal: Positive for abdominal pain. Negative for diarrhea, nausea and vomiting.  Genitourinary: Negative for dysuria and frequency.  Musculoskeletal: Negative for arthralgias and back pain.  Skin: Negative for rash.  Neurological: Negative for dizziness, weakness and headaches.       +  jitteriness     Vitals BP 140/88   Pulse 98   Temp 98 F (36.7 C)   Ht 5\' 2"  (1.575 m)   Wt 227 lb 9.6 oz (103.2 kg)   SpO2 97%   BMI 41.63 kg/m   Objective:   Physical Exam Vitals and nursing note reviewed.  Constitutional:      General: She is not in acute distress.    Appearance: Normal appearance. She is not ill-appearing.  HENT:     Head: Normocephalic and atraumatic.     Nose: Nose normal.     Mouth/Throat:     Mouth: Mucous membranes are moist.     Pharynx: Oropharynx is clear.  Eyes:     Extraocular Movements: Extraocular  movements intact.     Conjunctiva/sclera: Conjunctivae normal.     Pupils: Pupils are equal, round, and reactive to light.  Cardiovascular:     Rate and Rhythm: Normal rate and regular rhythm.     Pulses: Normal pulses.     Heart sounds: Normal heart sounds.  Pulmonary:     Effort: Pulmonary effort is normal.     Breath sounds: Normal breath sounds. No wheezing, rhonchi or rales.  Abdominal:     General: Bowel sounds are normal. There is no distension.     Palpations: Abdomen is soft. There is no mass.     Tenderness: There is no abdominal tenderness. There is no guarding or rebound.     Hernia: No hernia is present.  Musculoskeletal:        General: Normal range of motion.     Right lower leg: No edema.     Left lower leg: No edema.  Skin:    General: Skin is warm and dry.     Findings: No lesion or rash.  Neurological:     General: No focal deficit present.     Mental Status: She is alert and oriented to person, place, and time.  Psychiatric:        Mood and Affect: Mood normal.        Behavior: Behavior normal.      Assessment and Plan   1. Essential hypertension  2. Gastroesophageal reflux disease, unspecified whether esophagitis present - Ambulatory referral to Gastroenterology  3. Hypokalemia - Ambulatory referral to Gastroenterology   Hypokalemia and gerd- Pt having lot of difficulty tolerating the potassium, has been on it for years at high dose. On last visit advised to decrease potassium down from per day to and still having pain, so will decrease down to with food in AM.  Cont protonix and can add tums or pepcid prn.    htn- uncontrolled, advising to take 10mg  amlodipine in AM instead of 5mg  amlodipine.  Will recheck next visit.  Pt to f/u with GI for further evaluation of worsening reflux.   Last labs reviewed and urgent care and er notes reviewed.  ekg reviewed.   F/u 6wks for f/u htn and recheck potassium.

## 2020-06-25 ENCOUNTER — Ambulatory Visit: Payer: Medicare Other | Admitting: Family Medicine

## 2020-06-27 ENCOUNTER — Telehealth: Payer: Self-pay

## 2020-06-27 NOTE — Telephone Encounter (Signed)
Pt wants to know about pneumonia shot. She believes she had it last year and wants to know if she needs it again this year.

## 2020-06-27 NOTE — Telephone Encounter (Signed)
Patient has pneumococcal 23 -07/28/2019

## 2020-06-28 NOTE — Telephone Encounter (Signed)
Patient notified and verbalized understanding. 

## 2020-06-28 NOTE — Telephone Encounter (Signed)
Needs 1 vaccine before 65 and 1 after.  Thx. Dr. Ladona Ridgel

## 2020-07-03 ENCOUNTER — Other Ambulatory Visit: Payer: Self-pay

## 2020-07-03 ENCOUNTER — Encounter (INDEPENDENT_AMBULATORY_CARE_PROVIDER_SITE_OTHER): Payer: Self-pay | Admitting: Gastroenterology

## 2020-07-03 ENCOUNTER — Ambulatory Visit (INDEPENDENT_AMBULATORY_CARE_PROVIDER_SITE_OTHER): Payer: Medicare Other | Admitting: Gastroenterology

## 2020-07-03 VITALS — BP 150/80 | HR 83 | Temp 97.9°F | Ht 62.0 in | Wt 226.7 lb

## 2020-07-03 DIAGNOSIS — K219 Gastro-esophageal reflux disease without esophagitis: Secondary | ICD-10-CM | POA: Diagnosis not present

## 2020-07-03 NOTE — Progress Notes (Signed)
Jessica Kirby, M.D. Gastroenterology & Hepatology [facility] [address] [place], [place] [address] Primary Care Physician: Annalee Genta, DO [address] [place] [address]  Referring MD: PCP  I will communicate my assessment and recommendations to the referring MD via EMR. Note: Occasional unusual wording and randomly placed punctuation marks may result from the use of speech recognition technology to transcribe this document"  Chief Complaint:  Heartburn  History of Present Illness: Jessica Kirby is a 64 y.o. female with PMH anxiety, hypertension, hypothyroidism, depression,who presents for evaluation of heartburn.  Patient reports that for the last year she has presented worsening and recurrent episodes of regurgitation and heartburn episodes, which are currently happening ona  Daily basis. She states that she noticed the onset of her symptoms around 6 years ago when she was started on K oral supplementation. She states that for 4 years these symptoms were sporadic but the last years she has had significant pain. She reports that during the first 4 years she had improvement while taking Pepcid as needed but for the last year it was not helpful. She reports her PCP has been cutting down in the dose of K, she was taking 3 pills a day and now is taking only one with some relief. She was started on Protonix 40 mg in January 2021, which she has been taking mostly as night every other day due to concerns of long term exposure to the medication. The patient denies having any nausea, vomiting, fever, chills, hematochezia, melena, hematemesis, abdominal distention, abdominal pain, diarrhea, jaundice, pruritus or weight loss.  Last LKG:MWNUU Last Colonoscopy: 2017 - diverticulosis  FHx: neg for any gastrointestinal/liver disease, aunt and cousin breast cancer Social: second hand smoking for multiple years but does not smoke, alcohol or  illicit drug use Surgical: tubal ligation, hystersectomy  Past Medical History: Past Medical History:  Diagnosis Date  . Allergy   . Anxiety    Insomnia  . Depression    Insomnia  . GERD (gastroesophageal reflux disease)   . Herniated disc   . Hypertension   . Hypothyroidism    not medicated at this time  . Osteoporosis   . Thyroid disease    Hypothyroidism    Past Surgical History: Past Surgical History:  Procedure Laterality Date  . ABDOMINAL HYSTERECTOMY    . COLONOSCOPY N/A 04/07/2016   Procedure: COLONOSCOPY;  Surgeon: Corbin Ade, MD;  Location: [facility];  Service: Endoscopy;  Laterality: N/A;  8:30 AM  . TUBAL LIGATION    . WRIST SURGERY      Family History: Family History  Problem Relation Age of Onset  . Diabetes Father   . Heart attack Mother   . Colon cancer Neg Hx     Social History: Social History   Tobacco Use  Smoking Status Passive Smoke Exposure - Never Smoker  Smokeless Tobacco Never Used   Social History   Substance and Sexual Activity  Alcohol Use No  . Alcohol/week: 0.0 standard drinks   Social History   Substance and Sexual Activity  Drug Use No    Allergies: Allergies  Allergen Reactions  . Fosamax [Alendronate Sodium] Other (See Comments)    GI Headache  . Vioxx [Rofecoxib]     Per patient  . Losartan Cough  . Sulfa Antibiotics Hives    Medications: Current Outpatient Medications  Medication Sig Dispense Refill  . ALPRAZolam (XANAX) 0.5 MG tablet Take 1 tablet (0.5 mg  total) by mouth at bedtime as needed for anxiety. 15 tablet 0  . amLODipine (NORVASC) 10 MG tablet Take 1 tablet (10 mg total) by mouth daily. 90 tablet 1  . aspirin EC 81 MG tablet Take 81 mg by mouth daily.     . famotidine (PEPCID) 40 MG tablet Take 40 mg by mouth daily.    . fluticasone (FLONASE) 50 MCG/ACT nasal spray SHAKE LIQUID AND USE 2 SPRAYS IN EACH NOSTRIL DAILY 48 g 0  . indapamide (LOZOL) 2.5 MG tablet Take one po q am Monday and  Fridays. 30 tablet 1  . loratadine (CLARITIN) 10 MG tablet Take 10 mg by mouth daily as needed for allergies.     Marland Kitchen meclizine (ANTIVERT) 25 MG tablet Take 1 tablet (25 mg total) by mouth 3 (three) times daily as needed for dizziness. 30 tablet 0  . ondansetron (ZOFRAN ODT) 4 MG disintegrating tablet 4mg  ODT q4 hours prn nausea/vomit 12 tablet 0  . pantoprazole (PROTONIX) 40 MG tablet TAKE 1 TABLET(40 MG) BY MOUTH DAILY AS NEEDED FOR REFLUX 30 tablet 2  . potassium chloride SA (KLOR-CON) 20 MEQ tablet Take 1 tablet (20 mEq total) by mouth once for 1 dose. (Patient taking differently: Take 20 mEq by mouth daily. ) 90 tablet 1   No current facility-administered medications for this visit.    Review of Systems: GENERAL: negative for malaise, night sweats HEENT: No changes in hearing or vision, no nose bleeds or other nasal problems. NECK: Negative for lumps, goiter, pain and significant neck swelling RESPIRATORY: Negative for cough, wheezing CARDIOVASCULAR: Negative for chest pain, leg swelling, palpitations, orthopnea GI: SEE HPI MUSCULOSKELETAL: Negative for joint pain or swelling, back pain, and muscle pain. SKIN: Negative for lesions, rash PSYCH: Negative for sleep disturbance, mood disorder and recent psychosocial stressors. HEMATOLOGY Negative for prolonged bleeding, bruising easily, and swollen nodes. ENDOCRINE: Negative for cold or heat intolerance, polyuria, polydipsia and goiter. NEURO: negative for tremor, gait imbalance, syncope and seizures. The remainder of the review of systems is noncontributory.   Physical Exam: BP (!) 150/80 (BP Location: Left Arm, Patient Position: Sitting, Cuff Size: Normal)   Pulse 83   Temp 97.9 F (36.6 C) (Oral)   Ht 5\' 2"  (1.575 m)   Wt 226 lb 11.2 oz (102.8 kg)   BMI 41.46 kg/m  GENERAL: The patient is AO x3, in no acute distress. Obese. HEENT: Head is normocephalic and atraumatic. EOMI are intact. Mouth is well hydrated and without  lesions. NECK: Supple. No masses LUNGS: Clear to auscultation. No presence of rhonchi/wheezing/rales. Adequate chest expansion HEART: RRR, normal s1 and s2. ABDOMEN: Soft, nontender, no guarding, no peritoneal signs, and nondistended. BS +. No masses. EXTREMITIES: Without any cyanosis, clubbing, rash, lesions or edema. NEUROLOGIC: AOx3, no focal motor deficit. SKIN: no jaundice, no rashes  Imaging/Labs: as above  I personally reviewed and interpreted the available labs, imaging and endoscopic files.  Impression and Plan: SERRA YOUNAN is a 64 y.o. female with PMH anxiety, hypertension, hypothyroidism, depression,who presents for evaluation of heartburn. She has had chronic symptoms which seem to be compatible with GERD. She initially responnded to anti H2 but her symptoms have been more frequent recently. She has not been responding to PPI completely, but I consider this can be explained by the way she is taking the medication. I educated the patient on this aspect, she will need to take the medication properly, and will reassess in 3 months. An EGD is not warranted yet  as she has not presented any red flag signs or any other symptomatology, while not adequately taking the medication.  - Continue pantoprazole 40 mg qday - Explained presumed etiology of reflux symptoms. Instruction provided in the use of antireflux medication - patient should take medication in the morning 30-45 minutes before eating breakfast. Discussed avoidance of eating within 2 hours of lying down to sleep and benefit of blocks to elevate head of bed. - Will consider an EGD if persistent or new symptoms. - RTC 3 months  All questions were answered.      Jessica Blazing, MD Gastroenterology and Hepatology [facility]

## 2020-07-03 NOTE — Patient Instructions (Signed)
Continue pantoprazole 40 mg qday Explained presumed etiology of reflux symptoms. Instruction provided in the use of antireflux medication - patient should take medication in the morning 30-45 minutes before eating breakfast. Discussed avoidance of eating within 2 hours of lying down to sleep and benefit of blocks to elevate head of bed.  

## 2020-07-09 ENCOUNTER — Ambulatory Visit (INDEPENDENT_AMBULATORY_CARE_PROVIDER_SITE_OTHER): Payer: Medicare Other | Admitting: Family Medicine

## 2020-07-09 ENCOUNTER — Encounter: Payer: Self-pay | Admitting: Family Medicine

## 2020-07-09 VITALS — HR 85 | Temp 99.1°F | Resp 16

## 2020-07-09 DIAGNOSIS — J0100 Acute maxillary sinusitis, unspecified: Secondary | ICD-10-CM | POA: Diagnosis not present

## 2020-07-09 MED ORDER — AMOXICILLIN-POT CLAVULANATE 875-125 MG PO TABS: 1.0000 | ORAL_TABLET | Freq: Two times a day (BID) | ORAL | 0 refills | Status: DC

## 2020-07-09 NOTE — Progress Notes (Signed)
Patient ID: Jessica Kirby, female    DOB: [birth year], 64 y.o.   MRN: [id number]   No chief complaint on file.  Subjective:  CC: "sinus infection"  Sinusitis Associated symptoms include coughing and sinus pressure. Pertinent negatives include no chills, ear pain, shortness of breath, sneezing or sore throat.  HPI: Symptoms started on Sunday night, woke up with a headache and some throat drainage, reports tenderness in her face on both sides.  Denies fever. positive postnasal drainage. and positive cough at night only.  Received both of her Covid vaccines and she has received her flu shot.   Medical History Evadene has a past medical history of Allergy, Anxiety, Depression, GERD (gastroesophageal reflux disease), Herniated disc, Hypertension, Hypothyroidism, Osteoporosis, and Thyroid disease.   Outpatient Encounter Medications as of 07/09/2020  Medication Sig  . ALPRAZolam (XANAX) 0.5 MG tablet Take 1 tablet (0.5 mg total) by mouth at bedtime as needed for anxiety.  Marland Kitchen amLODipine (NORVASC) 10 MG tablet Take 1 tablet (10 mg total) by mouth daily.  Marland Kitchen amoxicillin-clavulanate (AUGMENTIN) 875-125 MG tablet Take 1 tablet by mouth 2 (two) times daily.  Marland Kitchen aspirin EC 81 MG tablet Take 81 mg by mouth daily.   . fluticasone (FLONASE) 50 MCG/ACT nasal spray SHAKE LIQUID AND USE 2 SPRAYS IN EACH NOSTRIL DAILY  . indapamide (LOZOL) 2.5 MG tablet Take one po q am Monday and Fridays.  Marland Kitchen loratadine (CLARITIN) 10 MG tablet Take 10 mg by mouth daily as needed for allergies.   Marland Kitchen meclizine (ANTIVERT) 25 MG tablet Take 1 tablet (25 mg total) by mouth 3 (three) times daily as needed for dizziness.  . ondansetron (ZOFRAN ODT) 4 MG disintegrating tablet 4mg  ODT q4 hours prn nausea/vomit  . pantoprazole (PROTONIX) 40 MG tablet TAKE 1 TABLET(40 MG) BY MOUTH DAILY AS NEEDED FOR REFLUX  . potassium chloride SA (KLOR-CON) 20 MEQ tablet Take 1 tablet (20 mEq total) by mouth once for 1 dose. (Patient taking differently:  Take 20 mEq by mouth daily. )   No facility-administered encounter medications on file as of 07/09/2020.     Review of Systems  Constitutional: Negative for chills and fever.  HENT: Positive for postnasal drip, sinus pressure and sinus pain. Negative for ear discharge, ear pain, rhinorrhea, sneezing, sore throat and trouble swallowing.   Eyes: Negative.   Respiratory: Positive for cough. Negative for chest tightness and shortness of breath.   Cardiovascular: Negative for chest pain.  Gastrointestinal: Negative.   Hematological: Negative for adenopathy.     Vitals Pulse 85   Temp 99.1 F (37.3 C)   Resp 16   SpO2 95%   Objective:   Physical Exam Constitutional:      General: She is not in acute distress.    Appearance: Normal appearance. She is not ill-appearing or toxic-appearing.  HENT:     Right Ear: Tympanic membrane normal.     Left Ear: Tympanic membrane normal.     Nose:     Right Sinus: Maxillary sinus tenderness present.     Left Sinus: Maxillary sinus tenderness present.     Mouth/Throat:     Mouth: Mucous membranes are moist.     Pharynx: Oropharynx is clear. No oropharyngeal exudate or posterior oropharyngeal erythema.  Cardiovascular:     Rate and Rhythm: Normal rate and regular rhythm.     Heart sounds: Normal heart sounds.  Pulmonary:     Effort: Pulmonary effort is normal.     Breath sounds: Normal breath  sounds.  Skin:    General: Skin is warm and dry.  Neurological:     Mental Status: She is alert and oriented to person, place, and time. Mental status is at baseline.  Psychiatric:        Mood and Affect: Mood normal.        Behavior: Behavior normal.        Thought Content: Thought content normal.        Judgment: Judgment normal.      Assessment and Plan   1. Acute non-recurrent maxillary sinusitis - amoxicillin-clavulanate (AUGMENTIN) 875-125 MG tablet; Take 1 tablet by mouth 2 (two) times daily.  Dispense: 20 tablet; Refill: 0   Due  to facial tenderness in the  maxillary areas I will treat for acute sinusitis.  Her pain is also around her eyes.  She denies any other symptoms no chest pain; no shortness of breath; abdominal pain; nausea/ vomiting.  She is instructed to use Tylenol or ibuprofen for the headaches.  Agrees with plan of care discussed today. Understands warning signs to seek  further care: Fever, headaches that wake you up at night, or any concerning symptoms. Understands to follow-up if her symptoms do not improve.

## 2020-07-18 ENCOUNTER — Other Ambulatory Visit (HOSPITAL_COMMUNITY): Payer: Self-pay | Admitting: Family Medicine

## 2020-07-18 DIAGNOSIS — Z1231 Encounter for screening mammogram for malignant neoplasm of breast: Secondary | ICD-10-CM

## 2020-07-23 ENCOUNTER — Telehealth: Payer: Self-pay

## 2020-07-23 DIAGNOSIS — I10 Essential (primary) hypertension: Secondary | ICD-10-CM

## 2020-07-23 NOTE — Telephone Encounter (Signed)
Pls order bmp. Non fasting.   Thanks dr. Ladona Ridgel

## 2020-07-23 NOTE — Telephone Encounter (Signed)
Pt called she has a upcoming appt wanted to know if blood work needed to be done before her appt   Pt call back [phone]

## 2020-07-23 NOTE — Telephone Encounter (Signed)
Last labs 05/2020: Met 7 then 02/2020: CBC and Met 7

## 2020-07-24 DIAGNOSIS — I10 Essential (primary) hypertension: Secondary | ICD-10-CM | POA: Diagnosis not present

## 2020-07-24 NOTE — Telephone Encounter (Signed)
Pt.notified

## 2020-07-24 NOTE — Telephone Encounter (Signed)
Order put in and left a message to return call to notify pt.

## 2020-07-25 LAB — BASIC METABOLIC PANEL
BUN/Creatinine Ratio: 14 (ref 12–28)
BUN: 9 mg/dL (ref 8–27)
CO2: 26 mmol/L (ref 20–29)
Calcium: 9.8 mg/dL (ref 8.7–10.3)
Chloride: 102 mmol/L (ref 96–106)
Creatinine, Ser: 0.66 mg/dL (ref 0.57–1.00)
GFR calc Af Amer: 106 mL/min/{1.73_m2} (ref >59)
GFR calc non Af Amer: 92 mL/min/{1.73_m2} (ref >59)
Glucose: 75 mg/dL (ref 65–99)
Potassium: 4.3 mmol/L (ref 3.5–5.2)
Sodium: 142 mmol/L (ref 134–144)

## 2020-07-29 ENCOUNTER — Ambulatory Visit (INDEPENDENT_AMBULATORY_CARE_PROVIDER_SITE_OTHER): Payer: Medicare Other | Admitting: Gastroenterology

## 2020-07-31 ENCOUNTER — Other Ambulatory Visit: Payer: Self-pay

## 2020-07-31 ENCOUNTER — Encounter: Payer: Self-pay | Admitting: Family Medicine

## 2020-07-31 ENCOUNTER — Ambulatory Visit (INDEPENDENT_AMBULATORY_CARE_PROVIDER_SITE_OTHER): Payer: Medicare Other | Admitting: Family Medicine

## 2020-07-31 VITALS — BP 130/78 | HR 99 | Temp 97.9°F | Wt 231.0 lb

## 2020-07-31 DIAGNOSIS — E876 Hypokalemia: Secondary | ICD-10-CM

## 2020-07-31 DIAGNOSIS — F419 Anxiety disorder, unspecified: Secondary | ICD-10-CM

## 2020-07-31 DIAGNOSIS — K219 Gastro-esophageal reflux disease without esophagitis: Secondary | ICD-10-CM

## 2020-07-31 MED ORDER — PANTOPRAZOLE SODIUM 40 MG PO TBEC: 40.0000 mg | DELAYED_RELEASE_TABLET | Freq: Every day | ORAL | 2 refills | Status: DC

## 2020-07-31 NOTE — Progress Notes (Signed)
Patient ID: VALLEY KE, female    DOB: May 23, 1956, 64 y.o.   MRN: [id number]   Chief Complaint  Patient presents with  . Hypertension    Patient following up on hypertension and potassium.    Subjective:    HPI F/u htn and gerd- Taking 1 tab daily and taking protonix and was told by GI.   Half sister with bladder cancer.  Dad had dm.  Mother with MI.  Sister- dvt on dad side.  Pt has mammo scheduled for tomorrow.  Getting covid booster tomorrow after her mammogram. Had flu vaccine.  Hypokalemia-  Tasting the potassium at times it's refluxing up. And at night worse.  drinking decaf coffee. caffiene making it worse.  Potassium now at 4.3 stable. Only taking potassium 1x daily now, was on klor con bid. Has famotidine at home to take prn. Feeling that has been gaining more weight.  Not exercising as much. Was on high dose of klor-con , then went to , and still noticing GI upset. Last visit gave referral to GI for worsening reflux.  Anxiety- Taking 1/2 tab 0.5mg  xanax. occ at night. Not needing medicaiton for sleep now.   htn- doing well with meds, last visit inc from 5mg  norvasc to 10mg . On indapamide and norvasc. Taking indapamide mon and fridays.  Taking norvasc daily. Pt taking potassium due to the indapamide.  Medical History Jessica Kirby has a past medical history of Allergy, Anxiety, Depression, GERD (gastroesophageal reflux disease), Herniated disc, Hypertension, Hypothyroidism, Osteoporosis, and Thyroid disease.   Outpatient Encounter Medications as of 07/31/2020  Medication Sig  . ALPRAZolam (XANAX) 0.5 MG tablet Take 1 tablet (0.5 mg total) by mouth at bedtime as needed for anxiety.  Olegario Messier amLODipine (NORVASC) 10 MG tablet Take 1 tablet (10 mg total) by mouth daily.  13/11/2019 amoxicillin-clavulanate (AUGMENTIN) 875-125 MG tablet Take 1 tablet by mouth 2 (two) times daily.  Marland Kitchen aspirin EC 81 MG tablet Take 81 mg by mouth daily.   . fluticasone (FLONASE) 50  MCG/ACT nasal spray SHAKE LIQUID AND USE 2 SPRAYS IN EACH NOSTRIL DAILY  . indapamide (LOZOL) 2.5 MG tablet Take one po q am Monday and Fridays.  Tuesday loratadine (CLARITIN) 10 MG tablet Take 10 mg by mouth daily as needed for allergies.   02-15-1990 meclizine (ANTIVERT) 25 MG tablet Take 1 tablet (25 mg total) by mouth 3 (three) times daily as needed for dizziness.  . ondansetron (ZOFRAN ODT) 4 MG disintegrating tablet 4mg  ODT q4 hours prn nausea/vomit  . pantoprazole (PROTONIX) 40 MG tablet Take 1 tablet (40 mg total) by mouth daily.  . potassium chloride SA (KLOR-CON) 20 MEQ tablet Take 1 tablet (20 mEq total) by mouth once for 1 dose. (Patient taking differently: Take 20 mEq by mouth daily. )  . [DISCONTINUED] pantoprazole (PROTONIX) 40 MG tablet TAKE 1 TABLET(40 MG) BY MOUTH DAILY AS NEEDED FOR REFLUX   No facility-administered encounter medications on file as of 07/31/2020.     Review of Systems  Constitutional: Negative for chills and fever.  HENT: Negative for congestion, rhinorrhea and sore throat.   Respiratory: Negative for cough, shortness of breath and wheezing.   Cardiovascular: Negative for chest pain and leg swelling.  Gastrointestinal: Negative for abdominal pain, diarrhea, nausea and vomiting.       +heart burn, reflux  Genitourinary: Negative for dysuria and frequency.  Musculoskeletal: Negative for arthralgias and back pain.  Skin: Negative for rash.  Neurological: Negative for dizziness, weakness and headaches.  Vitals BP 130/78   Pulse 99   Temp 97.9 F (36.6 C)   Wt 231 lb (104.8 kg)   SpO2 99%   BMI 42.25 kg/m   Objective:   Physical Exam Vitals and nursing note reviewed.  Constitutional:      Appearance: Normal appearance.  HENT:     Head: Normocephalic and atraumatic.     Nose: Nose normal.     Mouth/Throat:     Mouth: Mucous membranes are moist.     Pharynx: Oropharynx is clear.  Eyes:     Extraocular Movements: Extraocular movements intact.      Conjunctiva/sclera: Conjunctivae normal.     Pupils: Pupils are equal, round, and reactive to light.  Cardiovascular:     Rate and Rhythm: Normal rate and regular rhythm.     Pulses: Normal pulses.     Heart sounds: Normal heart sounds.  Pulmonary:     Effort: Pulmonary effort is normal.     Breath sounds: Normal breath sounds. No wheezing, rhonchi or rales.  Musculoskeletal:        General: Normal range of motion.     Right lower leg: No edema.     Left lower leg: No edema.  Skin:    General: Skin is warm and dry.     Findings: No lesion or rash.  Neurological:     General: No focal deficit present.     Mental Status: She is alert and oriented to person, place, and time.  Psychiatric:        Mood and Affect: Mood normal.        Behavior: Behavior normal.      Assessment and Plan   1. Hypokalemia  2. Gastroesophageal reflux disease, unspecified whether esophagitis present - pantoprazole (PROTONIX) 40 MG tablet; Take 1 tablet (40 mg total) by mouth daily.  Dispense: 30 tablet; Refill: 2  3. Anxiety   Rechecking potassium today and pt on 1 tab daily klor con.  K is stable. Last visit 06/13/20- K-4.5.  On 07/24/20- K- 4.3. since being on 1 tab potassium, seems K is stable.  Will cont on 1 tab daily.  Take it during the day and avoid taking it late at night and laying down.  gerd- cont protonix.  Avoiding caffeine, nsaids, greasy, spicy foods.    Anxiety-stable.  Taking xanax prn. Last script given 6/21.  Gave info on weight loss to give her headstart, pt to f/u to discuss this further.  Watch diet and increase in exercising.  F/u 41mo or prn.

## 2020-08-01 ENCOUNTER — Ambulatory Visit (HOSPITAL_COMMUNITY)
Admission: RE | Admit: 2020-08-01 | Discharge: 2020-08-01 | Disposition: A | Discharge status: Home / Self Care | Payer: Medicare Other | Source: Ambulatory Visit | Attending: Family Medicine | Admitting: Family Medicine

## 2020-08-01 DIAGNOSIS — Z1231 Encounter for screening mammogram for malignant neoplasm of breast: Secondary | ICD-10-CM

## 2020-08-11 ENCOUNTER — Encounter: Payer: Self-pay | Admitting: Family Medicine

## 2020-08-26 ENCOUNTER — Telehealth (INDEPENDENT_AMBULATORY_CARE_PROVIDER_SITE_OTHER): Payer: Self-pay | Admitting: Gastroenterology

## 2020-08-26 NOTE — Telephone Encounter (Signed)
Patient called stated she has been taking her protonix - states she is now constipated - would like to know if there is anything she can take - please advise - ph# [phone]

## 2020-08-26 NOTE — Telephone Encounter (Signed)
Spoke to patient about her current symptoms, states that she is having constipation that did not improve while taking Senokot x1 and enemas.  I advised her to start taking MiraLAX 1 caplet every day, she understood and agreed.

## 2020-08-27 ENCOUNTER — Telehealth: Payer: Self-pay

## 2020-08-27 NOTE — Telephone Encounter (Signed)
Not at this time.  Just had full panel in 6/21 and electrolytes just checked this month.   Thx.   Dr. Ladona Ridgel

## 2020-08-27 NOTE — Telephone Encounter (Signed)
BMET completed on 07/24/20, 06/13/20. BMET and Magnesium completed on 04/29/20 Please advise. Thank you

## 2020-08-27 NOTE — Telephone Encounter (Signed)
Schedule Wellness 12/28 and Pt is wanting to know if she will need blood work done?  Pt call back [phone]

## 2020-08-27 NOTE — Telephone Encounter (Signed)
Pt contacted and verbalized understanding.  

## 2020-09-03 DIAGNOSIS — M79675 Pain in left toe(s): Secondary | ICD-10-CM | POA: Diagnosis not present

## 2020-09-03 DIAGNOSIS — M79672 Pain in left foot: Secondary | ICD-10-CM | POA: Diagnosis not present

## 2020-09-03 DIAGNOSIS — L03032 Cellulitis of left toe: Secondary | ICD-10-CM | POA: Diagnosis not present

## 2020-09-03 DIAGNOSIS — L600 Ingrowing nail: Secondary | ICD-10-CM | POA: Diagnosis not present

## 2020-09-12 ENCOUNTER — Other Ambulatory Visit: Payer: Self-pay | Admitting: Family Medicine

## 2020-09-12 ENCOUNTER — Telehealth: Payer: Self-pay

## 2020-09-12 DIAGNOSIS — Z79899 Other long term (current) drug therapy: Secondary | ICD-10-CM

## 2020-09-12 NOTE — Telephone Encounter (Signed)
Pt wants to know if she can be tested for potassium again before her visit.   Pt call back [phone]

## 2020-09-12 NOTE — Telephone Encounter (Signed)
Orders put in and pt was notified.  

## 2020-09-12 NOTE — Telephone Encounter (Signed)
Yes, pls order bmp. Thx. Dr .Ladona Ridgel

## 2020-09-12 NOTE — Telephone Encounter (Signed)
Met 7 on 07/24/20 was last bw

## 2020-09-13 DIAGNOSIS — Z79899 Other long term (current) drug therapy: Secondary | ICD-10-CM | POA: Diagnosis not present

## 2020-09-14 LAB — BASIC METABOLIC PANEL
BUN/Creatinine Ratio: 14 (ref 12–28)
BUN: 11 mg/dL (ref 8–27)
CO2: 23 mmol/L (ref 20–29)
Calcium: 9.2 mg/dL (ref 8.7–10.3)
Chloride: 103 mmol/L (ref 96–106)
Creatinine, Ser: 0.76 mg/dL (ref 0.57–1.00)
GFR calc Af Amer: 95 mL/min/{1.73_m2} (ref >59)
GFR calc non Af Amer: 82 mL/min/{1.73_m2} (ref >59)
Glucose: 166 mg/dL — ABNORMAL HIGH (ref 65–99)
Potassium: 4.7 mmol/L (ref 3.5–5.2)
Sodium: 142 mmol/L (ref 134–144)

## 2020-09-17 DIAGNOSIS — L03032 Cellulitis of left toe: Secondary | ICD-10-CM | POA: Diagnosis not present

## 2020-09-17 DIAGNOSIS — M79675 Pain in left toe(s): Secondary | ICD-10-CM | POA: Diagnosis not present

## 2020-09-17 DIAGNOSIS — M79672 Pain in left foot: Secondary | ICD-10-CM | POA: Diagnosis not present

## 2020-09-17 DIAGNOSIS — L600 Ingrowing nail: Secondary | ICD-10-CM | POA: Diagnosis not present

## 2020-09-24 ENCOUNTER — Encounter: Payer: Self-pay | Admitting: Family Medicine

## 2020-09-24 ENCOUNTER — Ambulatory Visit (INDEPENDENT_AMBULATORY_CARE_PROVIDER_SITE_OTHER): Payer: Medicare Other | Admitting: Family Medicine

## 2020-09-24 ENCOUNTER — Other Ambulatory Visit: Payer: Self-pay

## 2020-09-24 VITALS — BP 134/84 | HR 88 | Temp 97.9°F | Wt 226.6 lb

## 2020-09-24 DIAGNOSIS — E876 Hypokalemia: Secondary | ICD-10-CM

## 2020-09-24 DIAGNOSIS — Z0000 Encounter for general adult medical examination without abnormal findings: Secondary | ICD-10-CM

## 2020-09-24 DIAGNOSIS — E66813 Obesity, class 3: Secondary | ICD-10-CM

## 2020-09-24 DIAGNOSIS — F418 Other specified anxiety disorders: Secondary | ICD-10-CM

## 2020-09-24 DIAGNOSIS — Z6841 Body Mass Index (BMI) 40.0 and over, adult: Secondary | ICD-10-CM

## 2020-09-24 DIAGNOSIS — I10 Essential (primary) hypertension: Secondary | ICD-10-CM | POA: Diagnosis not present

## 2020-09-24 NOTE — Progress Notes (Signed)
Patient ID: Jessica Kirby, female    DOB: 08-30-1956, 64 y.o.   MRN: [id number]   Chief Complaint  Patient presents with  . Medicare Wellness   Subjective:    HPI  AWV- Annual Wellness Visit  The patient was seen for their annual wellness visit. The patient's past medical history, surgical history, and family history were reviewed. Pertinent vaccines were reviewed ( tetanus, pneumonia, shingles, flu) The patient's medication list was reviewed and updated.  The height and weight were entered.  BMI recorded in electronic record elsewhere  Cognitive screening was completed. Outcome of Mini - Cog: Clock drawing incorrect. Correct 3 word memory.   Falls /depression screening electronically recorded within record elsewhere  (All patients who use tobacco were given written and verbal information on quitting)  Recent listing of emergency department/hospitalizations over the past year were reviewed.  current specialist the patient sees on a regular basis: none   Medicare annual wellness visit patient questionnaire was reviewed.  A written screening schedule for the patient for the next 5-10 years was given. Appropriate discussion of followup regarding next visit was discussed.  Scoring on her depression and anxiety screen- showing not controlled.  Not feeling this is a change from her usual self for the past 3 yrs, since husband passes.  By herself most of the time. Family is close but not seeing them much. 1-2x per month.  Pt concerned about weight gain, but doesn't feel she eats very much. Waking up at 3-4am. Eating bowl of cereal.  Then eating again 12-1pm. Not hungry for rest of day. Was on wellbutrin 150 and 300mg  and was discontinued in 2014.  Not having to take the protonix.  Not taking much klor con 20eQ. And taking it in apple sauce and drinking more water with it. Eating more veggies to help.   Medical History Jessica Kirby has a past medical history of Allergy,  Anxiety, Depression, GERD (gastroesophageal reflux disease), Herniated disc, Hypertension, Hypothyroidism, Osteoporosis, and Thyroid disease.   Outpatient Encounter Medications as of 09/24/2020  Medication Sig  . OVER THE COUNTER MEDICATION Tums Xyzal  . amLODipine (NORVASC) 10 MG tablet Take 1 tablet (10 mg total) by mouth daily.  09/26/2020 aspirin EC 81 MG tablet Take 81 mg by mouth daily.   . fluticasone (FLONASE) 50 MCG/ACT nasal spray SHAKE LIQUID AND USE 2 SPRAYS IN EACH NOSTRIL DAILY  . indapamide (LOZOL) 2.5 MG tablet Take one po q am Monday and Fridays.  . meclizine (ANTIVERT) 25 MG tablet Take 1 tablet (25 mg total) by mouth 3 (three) times daily as needed for dizziness.  . ondansetron (ZOFRAN ODT) 4 MG disintegrating tablet 4mg  ODT q4 hours prn nausea/vomit (Patient not taking: Reported on 09/24/2020)  . pantoprazole (PROTONIX) 40 MG tablet Take 1 tablet (40 mg total) by mouth daily.  . potassium chloride SA (KLOR-CON) 20 MEQ tablet Take 1 tablet (20 mEq total) by mouth once for 1 dose. (Patient taking differently: Take 20 mEq by mouth daily. )  . [DISCONTINUED] ALPRAZolam (XANAX) 0.5 MG tablet Take 1 tablet (0.5 mg total) by mouth at bedtime as needed for anxiety.  . [DISCONTINUED] amoxicillin-clavulanate (AUGMENTIN) 875-125 MG tablet Take 1 tablet by mouth 2 (two) times daily.  . [DISCONTINUED] loratadine (CLARITIN) 10 MG tablet Take 10 mg by mouth daily as needed for allergies.    No facility-administered encounter medications on file as of 09/24/2020.     Review of Systems  Constitutional: Negative for chills and fever.  HENT:  Negative for congestion, rhinorrhea and sore throat.   Respiratory: Negative for cough, shortness of breath and wheezing.   Cardiovascular: Negative for chest pain and leg swelling.  Gastrointestinal: Negative for abdominal pain, diarrhea, nausea and vomiting.  Genitourinary: Negative for dysuria and frequency.  Musculoskeletal: Negative for arthralgias and  back pain.  Skin: Negative for rash.  Neurological: Negative for dizziness, weakness and headaches.     Vitals BP 134/84   Pulse 88   Temp 97.9 F (36.6 C)   Wt 226 lb 9.6 oz (102.8 kg)   SpO2 97%   BMI 41.45 kg/m   Objective:   Physical Exam Vitals and nursing note reviewed.  Constitutional:      General: She is not in acute distress.    Appearance: Normal appearance. She is not ill-appearing.  HENT:     Head: Normocephalic and atraumatic.     Nose: Nose normal.     Mouth/Throat:     Mouth: Mucous membranes are moist.     Pharynx: Oropharynx is clear.  Eyes:     Extraocular Movements: Extraocular movements intact.     Conjunctiva/sclera: Conjunctivae normal.     Pupils: Pupils are equal, round, and reactive to light.  Cardiovascular:     Rate and Rhythm: Normal rate and regular rhythm.     Pulses: Normal pulses.     Heart sounds: Normal heart sounds.  Pulmonary:     Effort: Pulmonary effort is normal.     Breath sounds: Normal breath sounds. No wheezing, rhonchi or rales.  Musculoskeletal:        General: Normal range of motion.     Right lower leg: No edema.     Left lower leg: No edema.  Skin:    General: Skin is warm and dry.     Findings: No lesion or rash.  Neurological:     General: No focal deficit present.     Mental Status: She is alert and oriented to person, place, and time.  Psychiatric:        Mood and Affect: Mood normal.        Behavior: Behavior normal.      Assessment and Plan   1. Medicare annual wellness visit, initial  2. Class 3 severe obesity due to excess calories without serious comorbidity with body mass index (BMI) of 40.0 to 44.9 in adult (HCC) - Amb Ref to Medical Weight Management  3. Depression with anxiety - Ambulatory referral to Psychology  4. Primary hypertension  5. Hypokalemia    htn- suboptimal. Pt to check bp at home.  Pt just took medicaiton about 30 mins prior to appt. Dec salt and cont to watch diet.   On recheck in office, improved.   Obesity-Gave nutrionist referral  And number for weight watchers.  Also gave referral for counseling.  Pt not ready to start a medication yet. Tried wellbutrin in 2014, but made her feel more anxious.  H/o hypothyroid- Pt used to be on thyroid medication, in the past "but didn't need it anymore." Then things improved and discontinued it. Recheck on next visit.  Depression/anxiey- pt not ready to start a medication as in SSRI.  Would be interested in talking to a counselor.   Hypokalemia -improved, now only taking klor con and doing well.   Next visit needs labs- cmp, cmp, lipids, tsh.  F/u 68mo

## 2020-10-03 ENCOUNTER — Other Ambulatory Visit: Payer: Self-pay

## 2020-10-03 ENCOUNTER — Ambulatory Visit (INDEPENDENT_AMBULATORY_CARE_PROVIDER_SITE_OTHER): Payer: Medicare Other | Admitting: Gastroenterology

## 2020-10-03 ENCOUNTER — Encounter (INDEPENDENT_AMBULATORY_CARE_PROVIDER_SITE_OTHER): Payer: Self-pay | Admitting: Gastroenterology

## 2020-10-03 VITALS — BP 146/80 | HR 84 | Temp 97.9°F | Ht 62.0 in | Wt 228.0 lb

## 2020-10-03 DIAGNOSIS — K5900 Constipation, unspecified: Secondary | ICD-10-CM | POA: Insufficient documentation

## 2020-10-03 DIAGNOSIS — K219 Gastro-esophageal reflux disease without esophagitis: Secondary | ICD-10-CM

## 2020-10-03 DIAGNOSIS — K5904 Chronic idiopathic constipation: Secondary | ICD-10-CM

## 2020-10-03 MED ORDER — FAMOTIDINE 20 MG PO TABS: 20.0000 mg | ORAL_TABLET | ORAL | 1 refills | Status: DC | PRN

## 2020-10-03 NOTE — Patient Instructions (Signed)
Take Pepcid as needed for heartburn Start taking Miralax 1 cap every day for one week. If bowel movements do not improve, increase to 1 cup every 12 hours. If after two weeks there is no improvement, increase to 1 cup every 8 hours

## 2020-10-03 NOTE — Progress Notes (Signed)
Katrinka Blazing, M.D. Gastroenterology & Hepatology [facility] [address] [place], [place] [address]  Primary Care Physician: Annalee Genta, DO [address] [place] [address]  I will communicate my assessment and recommendations to the referring MD via EMR.  Problems: 1. GERD 2. Constipation  History of Present Illness: Jessica Kirby is a 64 y.o. female PMH anxiety, hypertension, hypothyroidism, depression, who presents for follow up of heartburn GERD and due to constipation.  The patient was last seen on 07/03/2020. At that time, the patient was advised on the proper way to take her PPI, which she kept taking once a day (Protonix 40 mg).  Patient that she has been trying to avoid taking PPI or anti H2 as she felt that these medications were "getting stuck in her chest".  Due to this, she has been taking Tums when she presents heartburn, which she is very infrequently.  She reports that she has heartburn possibly twice a month, usually triggered by intake of spicy food or cheese.  She has implemented avoiding triggering foods, as well as eating smaller meals and elevating the head of her bed.  Also, she reports feeling some bloating and having decreased frequency of bowel movements.  States that sometimes she has a bowel movement every 2 days and her bowel movements are small in size.  If she does not have a BM frequently, she feels LLQ abdominal pain. She takes Miralax as needed but not on a daily basis as she does not know if it will cause any long-term problems with her bowels.  The patient denies having any dysphagia, odynophaiga, nausea, vomiting, fever, chills, hematochezia, melena, hematemesis, diarrhea, jaundice, pruritus or weight loss (in fact has gained some pounds).  Past Medical History: Past Medical History:  Diagnosis Date  . Allergy   . Anxiety    Insomnia  . Depression    Insomnia  . GERD  (gastroesophageal reflux disease)   . Herniated disc   . Hypertension   . Hypothyroidism    not medicated at this time  . Osteoporosis   . Thyroid disease    Hypothyroidism    Past Surgical History: Past Surgical History:  Procedure Laterality Date  . ABDOMINAL HYSTERECTOMY    . COLONOSCOPY N/A 04/07/2016   Procedure: COLONOSCOPY;  Surgeon: Corbin Ade, MD;  Location: [facility];  Service: Endoscopy;  Laterality: N/A;  8:30 AM  . TUBAL LIGATION    . WRIST SURGERY      Family History: Family History  Problem Relation Age of Onset  . Diabetes Father   . Heart attack Mother   . Heart disease Mother   . Bladder Cancer Other   . Colon cancer Neg Hx     Social History: Social History   Tobacco Use  Smoking Status Passive Smoke Exposure - Never Smoker  Smokeless Tobacco Never Used   Social History   Substance and Sexual Activity  Alcohol Use No  . Alcohol/week: 0.0 standard drinks   Social History   Substance and Sexual Activity  Drug Use No    Allergies: Allergies  Allergen Reactions  . Fosamax [Alendronate Sodium] Other (See Comments)    GI Headache  . Vioxx [Rofecoxib]     Per patient  . Losartan Cough  . Sulfa Antibiotics Hives    Medications: Current Outpatient Medications  Medication Sig Dispense Refill  . amLODipine (NORVASC) 10 MG tablet Take 1 tablet (10 mg total) by mouth daily.  90 tablet 1  . aspirin EC 81 MG tablet Take 81 mg by mouth daily.     . famotidine (PEPCID) 10 MG tablet Take 10 mg by mouth as needed for heartburn or indigestion.    . fluticasone (FLONASE) 50 MCG/ACT nasal spray SHAKE LIQUID AND USE 2 SPRAYS IN EACH NOSTRIL DAILY 48 g 2  . indapamide (LOZOL) 2.5 MG tablet Take one po q am Monday and Fridays. 30 tablet 1  . loratadine (CLARITIN) 10 MG tablet Take 10 mg by mouth daily as needed for allergies.    Marland Kitchen meclizine (ANTIVERT) 25 MG tablet Take 1 tablet (25 mg total) by mouth 3 (three) times daily as needed for dizziness.  30 tablet 0  . Multiple Vitamin (MULTIVITAMIN WITH MINERALS) TABS tablet Take 1 tablet by mouth daily.    Marland Kitchen OVER THE COUNTER MEDICATION Tums 1-2 prn    . potassium chloride SA (KLOR-CON) 20 MEQ tablet Take 1 tablet (20 mEq total) by mouth once for 1 dose. (Patient taking differently: Take 20 mEq by mouth daily.) 90 tablet 1  . ondansetron (ZOFRAN ODT) 4 MG disintegrating tablet 4mg  ODT q4 hours prn nausea/vomit (Patient not taking: No sig reported) 12 tablet 0  . pantoprazole (PROTONIX) 40 MG tablet Take 1 tablet (40 mg total) by mouth daily. (Patient not taking: Reported on 10/03/2020) 30 tablet 2   No current facility-administered medications for this visit.    Review of Systems: GENERAL: negative for malaise, night sweats HEENT: No changes in hearing or vision, no nose bleeds or other nasal problems. NECK: Negative for lumps, goiter, pain and significant neck swelling RESPIRATORY: Negative for cough, wheezing CARDIOVASCULAR: Negative for chest pain, leg swelling, palpitations, orthopnea GI: SEE HPI MUSCULOSKELETAL: Negative for joint pain or swelling, back pain, and muscle pain. SKIN: Negative for lesions, rash PSYCH: Negative for sleep disturbance, mood disorder and recent psychosocial stressors. HEMATOLOGY Negative for prolonged bleeding, bruising easily, and swollen nodes. ENDOCRINE: Negative for cold or heat intolerance, polyuria, polydipsia and goiter. NEURO: negative for tremor, gait imbalance, syncope and seizures. The remainder of the review of systems is noncontributory.   Physical Exam: BP (!) 146/80 (BP Location: Left Arm, Patient Position: Sitting, Cuff Size: Large)   Pulse 84   Temp 97.9 F (36.6 C) (Oral)   Ht 5\' 2"  (1.575 m)   Wt 228 lb (103.4 kg)   BMI 41.70 kg/m  GENERAL: The patient is AO x3, in no acute distress. HEENT: Head is normocephalic and atraumatic. EOMI are intact. Mouth is well hydrated and without lesions. NECK: Supple. No masses LUNGS: Clear to  auscultation. No presence of rhonchi/wheezing/rales. Adequate chest expansion HEART: RRR, normal s1 and s2. ABDOMEN: Soft, nontender, no guarding, no peritoneal signs, and nondistended. BS +. No masses. EXTREMITIES: Without any cyanosis, clubbing, rash, lesions or edema. NEUROLOGIC: AOx3, no focal motor deficit. SKIN: no jaundice, no rashes  Imaging/Labs: as above  I personally reviewed and interpreted the available labs, imaging and endoscopic files.  Impression and Plan: Jessica Kirby is a 65 y.o. female PMH anxiety, hypertension, hypothyroidism, depression, who presents for follow up of heartburn GERD and due to constipation.  In terms of her GERD, her lifestyle changes have improved her symptoms despite not taking any medication.  She has very rare episodes of heartburn, which I consider can be treated with the use of Pepcid as needed for now.  She does not need to take any PPI at this moment.  Notably, the patient is motivated to  lose weight which will help decreasing her heartburn episodes.  She will see a dietitian for this, will, who will guide her in terms of her antireflux regimen and weight loss.  Finally, patient has presented constipation without red flag signs.  He has previously improved with the use of MiraLAX, I recommended her to take it on a daily basis as this will improve her symptom control.  - Continue GERD lifestyle changes - Take Pepcid 20 mg as needed for heartburn - Stop pantoprazole - Start taking Miralax 1 cap every day for one week. If bowel movements do not improve, increase to 1 cup every 12 hours. If after two weeks there is no improvement, increase to 1 cup every 8 hours - RTC 1 year  All questions were answered.      Dolores Frame, MD Gastroenterology and Hepatology [facility]

## 2020-10-08 DIAGNOSIS — M79672 Pain in left foot: Secondary | ICD-10-CM | POA: Diagnosis not present

## 2020-10-08 DIAGNOSIS — M79675 Pain in left toe(s): Secondary | ICD-10-CM | POA: Diagnosis not present

## 2020-10-08 DIAGNOSIS — L03032 Cellulitis of left toe: Secondary | ICD-10-CM | POA: Diagnosis not present

## 2020-10-08 DIAGNOSIS — L600 Ingrowing nail: Secondary | ICD-10-CM | POA: Diagnosis not present

## 2020-10-17 ENCOUNTER — Telehealth: Payer: Medicare Other | Admitting: Family Medicine

## 2020-10-18 ENCOUNTER — Telehealth: Payer: Self-pay | Admitting: Family Medicine

## 2020-10-18 ENCOUNTER — Telehealth: Payer: Medicare Other | Admitting: Family Medicine

## 2020-10-18 ENCOUNTER — Telehealth (INDEPENDENT_AMBULATORY_CARE_PROVIDER_SITE_OTHER): Payer: Medicare Other | Admitting: Family Medicine

## 2020-10-18 ENCOUNTER — Other Ambulatory Visit: Payer: Self-pay

## 2020-10-18 ENCOUNTER — Encounter: Payer: Self-pay | Admitting: Family Medicine

## 2020-10-18 DIAGNOSIS — J019 Acute sinusitis, unspecified: Secondary | ICD-10-CM

## 2020-10-18 DIAGNOSIS — B9689 Other specified bacterial agents as the cause of diseases classified elsewhere: Secondary | ICD-10-CM | POA: Diagnosis not present

## 2020-10-18 MED ORDER — AMOXICILLIN-POT CLAVULANATE 875-125 MG PO TABS: 1.0000 | ORAL_TABLET | Freq: Two times a day (BID) | ORAL | 0 refills | Status: DC

## 2020-10-18 NOTE — Progress Notes (Signed)
Pt having ride side sinus issues. Throat drainage mostly in the mornings and some fluid in right ear. No headache. No fever. Has been taking loratadine. Going on for 3-4 days.   Virtual Visit via Telephone Note  I connected with Jessica Kirby on 10/18/20 at  1:10 PM EST by telephone and verified that I am speaking with the correct person using two identifiers.  Location: Patient: home Provider: office   I discussed the limitations, risks, security and privacy concerns of performing an evaluation and management service by telephone and the availability of in person appointments. I also discussed with the patient that there may be a patient responsible charge related to this service. The patient expressed understanding and agreed to proceed.   History of Present Illness:    Observations/Objective:   Assessment and Plan:   Follow Up Instructions:    I discussed the assessment and treatment plan with the patient. The patient was provided an opportunity to ask questions and all were answered. The patient agreed with the plan and demonstrated an understanding of the instructions.   The patient was advised to call back or seek an in-person evaluation if the symptoms worsen or if the condition fails to improve as anticipated.  I provided 9 minutes of non-face-to-face time during this encounter.    Patient ID: Jessica Kirby, female    DOB: December 25, 1955, 65 y.o.   MRN: [id number]   Chief Complaint  Patient presents with   Sinusitis   Subjective:  CC: sinus issues  This is a new problem.  Presents today via telephone visit with a complaint of sinus issues, and fluid in the ear.  Associated symptoms include drainage down the throat.  Drainage is worse in the morning and at night.  Symptoms have been present for 3 to 4 days.  Also describes as itchy.  Has been taking loratadine and Flonase which has helped some but not completely.  Pertinent negatives include no fever, no chills, no  headache, no chest pain, no shortness of breath.    Medical History Jessica Kirby has a past medical history of Allergy, Anxiety, Depression, GERD (gastroesophageal reflux disease), Herniated disc, Hypertension, Hypothyroidism, Osteoporosis, and Thyroid disease.   Outpatient Encounter Medications as of 10/18/2020  Medication Sig   amLODipine (NORVASC) 10 MG tablet Take 1 tablet (10 mg total) by mouth daily.   amoxicillin-clavulanate (AUGMENTIN) 875-125 MG tablet Take 1 tablet by mouth 2 (two) times daily.   aspirin EC 81 MG tablet Take 81 mg by mouth daily.    famotidine (PEPCID) 20 MG tablet Take 1 tablet (20 mg total) by mouth as needed for heartburn or indigestion.   fluticasone (FLONASE) 50 MCG/ACT nasal spray SHAKE LIQUID AND USE 2 SPRAYS IN EACH NOSTRIL DAILY   indapamide (LOZOL) 2.5 MG tablet Take one po q am Monday and Fridays.   loratadine (CLARITIN) 10 MG tablet Take 10 mg by mouth daily as needed for allergies.   meclizine (ANTIVERT) 25 MG tablet Take 1 tablet (25 mg total) by mouth 3 (three) times daily as needed for dizziness.   Multiple Vitamin (MULTIVITAMIN WITH MINERALS) TABS tablet Take 1 tablet by mouth daily.   ondansetron (ZOFRAN ODT) 4 MG disintegrating tablet 4mg  ODT q4 hours prn nausea/vomit   OVER THE COUNTER MEDICATION Tums 1-2 prn   potassium chloride SA (KLOR-CON) 20 MEQ tablet Take 1 tablet (20 mEq total) by mouth once for 1 dose. (Patient taking differently: Take 20 mEq by mouth daily.)   No facility-administered  encounter medications on file as of 10/18/2020.     Review of Systems  Constitutional: Negative for chills, fatigue and fever.  HENT: Positive for postnasal drip. Negative for congestion, ear pain, sinus pressure and sinus pain.   Respiratory: Negative for cough and shortness of breath.   Cardiovascular: Negative for chest pain.  Gastrointestinal: Negative for abdominal pain, diarrhea, nausea and vomiting.  Genitourinary: Negative for dysuria.      Vitals There were no vitals taken for this visit. Unable Objective:   Physical Exam  No physical exam, able to converse throughout telephone visit without any obvious shortness of breath. Assessment and Plan   1. Acute bacterial rhinosinusitis - amoxicillin-clavulanate (AUGMENTIN) 875-125 MG tablet; Take 1 tablet by mouth 2 (two) times daily.  Dispense: 20 tablet; Refill: 0   Reports sinus issues, feels this is a sinus infection.  Will treat with antibiotic for 10 days.  Recommend supportive therapy, adequate hydration, nasal saline flushes, and humidification to clean sinuses out.  Agrees with plan of care discussed today. Understands warning signs to seek further care: chest pain, shortness of breath, any significant change in health.  Understands to follow-up if symptoms do not improve, or worsen.    Novella Olive, NP 10/18/2020

## 2020-10-18 NOTE — Telephone Encounter (Signed)
[name], greenspan are scheduled for a virtual visit with your provider today.    Just as we do with appointments in the office, we must obtain your consent to participate.  Your consent will be active for this visit and any virtual visit you may have with one of our providers in the next 365 days.    If you have a MyChart account, I can also send a copy of this consent to you electronically.  All virtual visits are billed to your insurance company just like a traditional visit in the office.  As this is a virtual visit, video technology does not allow for your provider to perform a traditional examination.  This may limit your provider's ability to fully assess your condition.  If your provider identifies any concerns that need to be evaluated in person or the need to arrange testing such as labs, EKG, etc, we will make arrangements to do so.    Although advances in technology are sophisticated, we cannot ensure that it will always work on either your end or our end.  If the connection with a video visit is poor, we may have to switch to a telephone visit.  With either a video or telephone visit, we are not always able to ensure that we have a secure connection.   I need to obtain your verbal consent now.   Are you willing to proceed with your visit today?   Jessica Kirby has provided verbal consent on 10/18/2020 for a virtual visit (video or telephone).   [name], LPN 0/[id number]/3338  32:91 AM

## 2020-10-22 ENCOUNTER — Ambulatory Visit (INDEPENDENT_AMBULATORY_CARE_PROVIDER_SITE_OTHER): Payer: Medicare Other | Admitting: Psychologist

## 2020-10-22 ENCOUNTER — Ambulatory Visit: Payer: Self-pay | Admitting: Psychologist

## 2020-10-22 DIAGNOSIS — F320 Major depressive disorder, single episode, mild: Secondary | ICD-10-CM

## 2020-10-25 ENCOUNTER — Encounter: Payer: Medicare Other | Attending: Family Medicine | Admitting: Dietician

## 2020-10-25 ENCOUNTER — Encounter: Payer: Self-pay | Admitting: Dietician

## 2020-10-25 ENCOUNTER — Other Ambulatory Visit: Payer: Self-pay

## 2020-10-25 VITALS — Ht 62.0 in | Wt 226.8 lb

## 2020-10-25 DIAGNOSIS — E669 Obesity, unspecified: Secondary | ICD-10-CM

## 2020-10-25 DIAGNOSIS — I10 Essential (primary) hypertension: Secondary | ICD-10-CM

## 2020-10-25 DIAGNOSIS — K219 Gastro-esophageal reflux disease without esophagitis: Secondary | ICD-10-CM | POA: Insufficient documentation

## 2020-10-25 NOTE — Patient Instructions (Addendum)
Check your multivitamin to see if it contains iron. If it does, switch to a one a day WITHOUT iron and see if your symptoms change.  Sleep with your head elevated to help with GERD.  Consider walking the trail at [facility] with your neighbors.  Have no more than 5 bottles of water a day, and stop drinking fluids after 6 pm.  Move to skim milk instead of 2%   Move your late night snack to around 6 pm, and do not eat within 2-3 hours of going to bed

## 2020-10-25 NOTE — Progress Notes (Signed)
Medical Nutrition Therapy  Appointment Start time:  0800  Appointment End time:  0910  Primary concerns today: Unexpected weight gain  Referral diagnosis: E66.01 Class 3 severe obesity Preferred learning style: No preference indicated Learning readiness: Ready   NUTRITION ASSESSMENT   Anthropometrics  Wt: 226.8 lbs Ht: 5'2" Body mass index is 41.48 kg/m.   Clinical Medical Hx: Osteoporosis, HTN, GERD Medications: Amlodopine, famotidine Labs: N/A Notable Signs/Symptoms: Persistent reflux, low energy  Lifestyle & Dietary Hx Pt reports unexpected weight gain and increasing GERD as weight increases. Pt reports husband passed away about 3 years ago. 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 Pt reports lots of stress carried over from the loss of her husband of 48 years. Pt reports reflux when taking a multivitamin, taking daily MV that she suspects has iron in it. Pt reports if she eats a meal around 12:30-1:00 pm, she does not get hungry the rest of the day. Pt will eat a snack around 9 right before bed. Pt reports difficulty staying asleep due to worrying about reflux. Pt reports going to see a counselor to help lower her stress and to help with the grieving process for her loss.   Estimated daily fluid intake: 128 oz Supplements: Daily MV, potassium chloride Sleep: Up a lot, anxious and frequent urination Stress / self-care: 9/10, loss of husband of 48 years. Current average weekly physical activity: ADLs, walks around the yard  24-Hr Dietary Recall First Meal: Honey Nut cheerios, 2% milk, banana Snack: none Second Meal: [place], Fried Chicken breast, creamed potatoes, green beans, Mr. Freda Jackson Snack: none Third Meal: none Snack: PB crackers Beverages: water    NUTRITION DIAGNOSIS  NB-1.1 Food and nutrition-related knowledge deficit As related to GERD.  As evidenced by self reported persistent  reflux, sedentary lifestlye, irregular meal pattern, and intake of high fat/fried foods.   NUTRITION INTERVENTION  Nutrition education (E-1) on the following topics:  . GERD o Educate pt on foods that contribute to GERD symptoms. This includes high fat and fried foods, caffeine, spicy foods, and sodas. Educate pt on the importance of consistent meal times and physical activity. Educate pt on the role that stress plays on increasing reflux, and the importance of working on stress management. Advise pt to refrain from eating within 2-3 hours of laying down to sleep. Advise pt to sleep on their back with their head elevated to reduce reflux.   Handouts Provided Include   Balanced Plate  Learning Style & Readiness for Change Teaching method utilized: Visual & Auditory  Demonstrated degree of understanding via: Teach Back  Barriers to learning/adherence to lifestyle change: none  Goals Established by Pt  Check your multivitamin to see if it contains iron. If it does, switch to a one a day WITHOUT iron and see if your symptoms change.  Sleep with your head elevated to help with GERD.  Consider walking the trail at [facility] with your neighbors.  Have no more than 5 bottles of water a day, and stop drinking fluids after 6 pm.  Move to skim milk instead of 2%   Move your late night snack to around 6 pm, and do not eat within 2-3 hours of going to bed  MONITORING & EVALUATION Dietary intake, weekly physical activity, and instances of reflux in 1 month.  Next Steps  Patient is to follow up with dietitian.

## 2020-10-28 DIAGNOSIS — L600 Ingrowing nail: Secondary | ICD-10-CM | POA: Diagnosis not present

## 2020-10-28 DIAGNOSIS — M79675 Pain in left toe(s): Secondary | ICD-10-CM | POA: Diagnosis not present

## 2020-10-28 DIAGNOSIS — M79672 Pain in left foot: Secondary | ICD-10-CM | POA: Diagnosis not present

## 2020-10-28 DIAGNOSIS — L03032 Cellulitis of left toe: Secondary | ICD-10-CM | POA: Diagnosis not present

## 2020-10-31 ENCOUNTER — Ambulatory Visit: Payer: Medicare Other | Admitting: Family Medicine

## 2020-11-01 ENCOUNTER — Ambulatory Visit: Payer: Medicare Other | Admitting: Family Medicine

## 2020-11-04 ENCOUNTER — Ambulatory Visit (INDEPENDENT_AMBULATORY_CARE_PROVIDER_SITE_OTHER): Payer: Medicare Other | Admitting: Psychologist

## 2020-11-04 DIAGNOSIS — F320 Major depressive disorder, single episode, mild: Secondary | ICD-10-CM

## 2020-11-06 ENCOUNTER — Telehealth: Payer: Self-pay | Admitting: Family Medicine

## 2020-11-06 NOTE — Telephone Encounter (Signed)
Patient is requesting refill on xanxa 0.5 mg. [facility]

## 2020-11-07 MED ORDER — ALPRAZOLAM 0.5 MG PO TABS: 0.5000 mg | ORAL_TABLET | Freq: Every evening | ORAL | 0 refills | Status: DC | PRN

## 2020-11-07 NOTE — Telephone Encounter (Signed)
Small amt sent. Thx dr. Ladona Ridgel

## 2020-11-07 NOTE — Telephone Encounter (Signed)
Patient notified

## 2020-11-08 ENCOUNTER — Telehealth: Payer: Self-pay

## 2020-11-08 DIAGNOSIS — R5383 Other fatigue: Secondary | ICD-10-CM

## 2020-11-08 NOTE — Telephone Encounter (Signed)
Pt is wanted to have her potassium check she is starting to feel weak  Pt call back [phone]

## 2020-11-08 NOTE — Telephone Encounter (Signed)
Pt states she has been feeling weak for the past day or two. Pt is taking potassium pills once a day. Please advise. Thank you

## 2020-11-08 NOTE — Telephone Encounter (Signed)
Lab order placed and pt is aware 

## 2020-11-08 NOTE — Telephone Encounter (Signed)
Met 7 please

## 2020-11-11 DIAGNOSIS — M79675 Pain in left toe(s): Secondary | ICD-10-CM | POA: Diagnosis not present

## 2020-11-11 DIAGNOSIS — M79672 Pain in left foot: Secondary | ICD-10-CM | POA: Diagnosis not present

## 2020-11-11 DIAGNOSIS — R5383 Other fatigue: Secondary | ICD-10-CM | POA: Diagnosis not present

## 2020-11-11 DIAGNOSIS — L03032 Cellulitis of left toe: Secondary | ICD-10-CM | POA: Diagnosis not present

## 2020-11-11 DIAGNOSIS — L600 Ingrowing nail: Secondary | ICD-10-CM | POA: Diagnosis not present

## 2020-11-12 ENCOUNTER — Other Ambulatory Visit: Payer: Self-pay | Admitting: *Deleted

## 2020-11-12 DIAGNOSIS — R739 Hyperglycemia, unspecified: Secondary | ICD-10-CM

## 2020-11-12 DIAGNOSIS — Z13220 Encounter for screening for lipoid disorders: Secondary | ICD-10-CM

## 2020-11-12 DIAGNOSIS — Z79899 Other long term (current) drug therapy: Secondary | ICD-10-CM

## 2020-11-12 LAB — BASIC METABOLIC PANEL
BUN/Creatinine Ratio: 13 (ref 12–28)
BUN: 11 mg/dL (ref 8–27)
CO2: 22 mmol/L (ref 20–29)
Calcium: 9.6 mg/dL (ref 8.7–10.3)
Chloride: 101 mmol/L (ref 96–106)
Creatinine, Ser: 0.86 mg/dL (ref 0.57–1.00)
GFR calc Af Amer: 81 mL/min/{1.73_m2} (ref >59)
GFR calc non Af Amer: 71 mL/min/{1.73_m2} (ref >59)
Glucose: 290 mg/dL — ABNORMAL HIGH (ref 65–99)
Potassium: 3.6 mmol/L (ref 3.5–5.2)
Sodium: 139 mmol/L (ref 134–144)

## 2020-11-13 LAB — LIPID PANEL
Chol/HDL Ratio: 4.8 ratio — ABNORMAL HIGH (ref 0.0–4.4)
Cholesterol, Total: 210 mg/dL — ABNORMAL HIGH (ref 100–199)
HDL: 44 mg/dL (ref >39)
LDL Chol Calc (NIH): 138 mg/dL — ABNORMAL HIGH (ref 0–99)
Triglycerides: 153 mg/dL — ABNORMAL HIGH (ref 0–149)
VLDL Cholesterol Cal: 28 mg/dL (ref 5–40)

## 2020-11-13 LAB — HEPATIC FUNCTION PANEL
ALT: 18 IU/L (ref 0–32)
AST: 19 IU/L (ref 0–40)
Albumin: 4.5 g/dL (ref 3.8–4.8)
Alkaline Phosphatase: 108 IU/L (ref 44–121)
Bilirubin Total: 0.6 mg/dL (ref 0.0–1.2)
Bilirubin, Direct: 0.15 mg/dL (ref 0.00–0.40)
Total Protein: 7.1 g/dL (ref 6.0–8.5)

## 2020-11-13 LAB — HEMOGLOBIN A1C
Est. average glucose Bld gHb Est-mCnc: 157 mg/dL
Hgb A1c MFr Bld: 7.1 % — ABNORMAL HIGH (ref 4.8–5.6)

## 2020-11-15 ENCOUNTER — Ambulatory Visit: Payer: Self-pay | Admitting: Psychologist

## 2020-11-22 ENCOUNTER — Other Ambulatory Visit: Payer: Self-pay

## 2020-11-22 ENCOUNTER — Encounter: Payer: Self-pay | Admitting: Dietician

## 2020-11-22 ENCOUNTER — Encounter: Payer: Medicare Other | Attending: Family Medicine | Admitting: Dietician

## 2020-11-22 NOTE — Progress Notes (Signed)
Medical Nutrition Therapy  Appointment Start time:  0800  Appointment End time:  0830  Primary concerns today: Elevated blood sugar Referral diagnosis: E66.01 Class 3 severe obesity Preferred learning style: No preference indicated Learning readiness: Ready   NUTRITION ASSESSMENT   Anthropometrics  Wt: 224.8 lbs Ht: 5'2" Body mass index is 40.75 kg/m.   Clinical Medical Hx: Osteoporosis, HTN, GERD Medications: Amlodopine, famotidine Labs: NEW: 11/12/2020 A1c - 7.1, Glucose - 290, TC - 210, LDL - 138 Notable Signs/Symptoms: Persistent reflux, low energy  Lifestyle & Dietary Hx Pt reports going for blood work and their CBG was high, and A1c was 7.1. Pt reports stress eating previous to the blood draw. Pt reports trying to cut carbs, and moving more after eating as a result of their lab results. Pt reports trying to walk 30 minutes after eating. Pt reports acid reflux has decreased dramatically, and they have stopped taking famotidine in the past week. Pt reports no symptoms of GERD since stopping famotidine. Pt reports eating three times a day, increased energy levels now. Pt reports trying to eat less fried foods.  Pt reports large amounts of stress before blood work. Pt states their son moved in with her which was stressful, but he moved out.   Estimated daily fluid intake: 128 oz Supplements: Daily MV, potassium chloride Sleep: Up a lot, anxious and frequent urination Stress / self-care: 9/10, loss of husband of 48 years. Current average weekly physical activity: ADLs, walks around the yard  24-Hr Dietary Recall First Meal: Honey Nut cheerios,skim milk, 1/2 banana, coffee with Stevia and skim milk Snack: 1 bar of Halo protein ice cream Second Meal: Fried chicken breast, green beans, mashed potatoes, diet coke Snack: Apple Third Meal: Honey nut cheerios, skim milk, coffee Snack: none Beverages: water    NUTRITION DIAGNOSIS  NB-1.1 Food and nutrition-related knowledge  deficit As related to GERD.  As evidenced by self reported persistent reflux, sedentary lifestlye, irregular meal pattern, and intake of high fat/fried foods.   NUTRITION INTERVENTION  Nutrition education (E-1) on the following topics:  . GERD o Educate pt on foods that contribute to GERD symptoms. This includes high fat and fried foods, caffeine, spicy foods, and sodas. Educate pt on the importance of consistent meal times and physical activity. Educate pt on the role that stress plays on increasing reflux, and the importance of working on stress management. Advise pt to refrain from eating within 2-3 hours of laying down to sleep. Advise pt to sleep on their back with their head elevated to reduce reflux.  NEW:  Diabetes Educated patient on factors that contribute to elevation of blood sugars, such as stress, illness, injury,and food choices. Discussed the role that physical activity plays in lowering blood sugar. Discussed the importance of eating a consistent amount of carbohydrate throughout the day.   Cholesterol  Educate pt on factors that can elevate LDL cholesterol, including high dietary intake of saturated fats. Educate pt on identifying sources of saturated fats, and how to make alternative food choices to lower saturated fat intake.Educate pt on the role of physical activity in lowering cholesterol.  Handouts Provided Include   Balanced Plate  NEW: Yellow meal plan card  Learning Style & Readiness for Change Teaching method utilized: Visual & Auditory  Demonstrated degree of understanding via: Teach Back  Barriers to learning/adherence to lifestyle change: none  Goals Established by Pt  Choose frozen veggies instead of canned veggies.  Keep up the great work with the physical  activity and drinking 5 bottles of water.  Continue to begin recognizing carbs in your food choiies, and work on eating around 45g at each meal.  For breakfast have some oatmeal or  3/4 cup  honey nut cheerios   1 cup skim milk  1/2 banana  Use a combination of these three strategies to effectively lower your consumption of saturated fat  1) Consume a smaller portion when having saturated fat  2) Consume saturated fat less frequently  3) Find a reduced or low saturated fat version MONITORING & EVALUATION Dietary intake, weekly physical activity, and instances of reflux in 1 month.  Next Steps  Patient is to follow up with dietitian.

## 2020-11-22 NOTE — Patient Instructions (Addendum)
Choose frozen veggies instead of canned veggies.  Keep up the great work with the physical activity and drinking 5 bottles of water.  Continue to begin recognizing carbs in your food choiies, and work on eating around 45g at each meal.  For breakfast have some oatmeal or 3/4 cup honey nut cheerios  1 cup skim milk 1/2 banana  Use a combination of these three strategies to effectively lower your consumption of saturated fat 1) Consume a smaller portion when having saturated fat 2) Consume saturated fat less frequently 3) Find a reduced or low saturated fat version

## 2020-11-25 DIAGNOSIS — M79672 Pain in left foot: Secondary | ICD-10-CM | POA: Diagnosis not present

## 2020-11-25 DIAGNOSIS — M79675 Pain in left toe(s): Secondary | ICD-10-CM | POA: Diagnosis not present

## 2020-11-25 DIAGNOSIS — L600 Ingrowing nail: Secondary | ICD-10-CM | POA: Diagnosis not present

## 2020-11-25 DIAGNOSIS — L03032 Cellulitis of left toe: Secondary | ICD-10-CM | POA: Diagnosis not present

## 2020-11-27 ENCOUNTER — Other Ambulatory Visit: Payer: Self-pay

## 2020-11-27 ENCOUNTER — Encounter: Payer: Self-pay | Admitting: Family Medicine

## 2020-11-27 ENCOUNTER — Ambulatory Visit (INDEPENDENT_AMBULATORY_CARE_PROVIDER_SITE_OTHER): Payer: Medicare Other | Admitting: Family Medicine

## 2020-11-27 VITALS — BP 158/87 | HR 95 | Temp 97.4°F | Ht 62.0 in | Wt 220.0 lb

## 2020-11-27 DIAGNOSIS — E119 Type 2 diabetes mellitus without complications: Secondary | ICD-10-CM | POA: Diagnosis not present

## 2020-11-27 DIAGNOSIS — I10 Essential (primary) hypertension: Secondary | ICD-10-CM | POA: Diagnosis not present

## 2020-11-27 NOTE — Progress Notes (Signed)
Patient ID: Jessica Kirby, female    DOB: 01/15/1956, 65 y.o.   MRN: [id number]   Chief Complaint  Patient presents with  . Discussion  . lab review   Subjective:    HPI   Pt here to discuss recent labs. Pt had labs completed on 11/12/20. Pt has been working on diet, walking, and seeing a nutritionist. Pt states she was not fasting when labs were done and was stressed at the time. Pt currently not on med for sugar or cholesterol.  Pt stating working on -Low fat and low carb diet and  Pt trying to walk.    Medical History Jessica Kirby has a past medical history of Allergy, Anxiety, Depression, GERD (gastroesophageal reflux disease), Herniated disc, Hypertension, Hypothyroidism, Osteoporosis, and Thyroid disease.   Outpatient Encounter Medications as of 11/27/2020  Medication Sig  . ALPRAZolam (XANAX) 0.5 MG tablet Take 1 tablet (0.5 mg total) by mouth at bedtime as needed for anxiety.  Marland Kitchen amLODipine (NORVASC) 10 MG tablet Take 1 tablet (10 mg total) by mouth daily.  Marland Kitchen amoxicillin-clavulanate (AUGMENTIN) 875-125 MG tablet Take 1 tablet by mouth 2 (two) times daily.  Marland Kitchen aspirin EC 81 MG tablet Take 81 mg by mouth daily.   . famotidine (PEPCID) 20 MG tablet Take 1 tablet (20 mg total) by mouth as needed for heartburn or indigestion.  . fluticasone (FLONASE) 50 MCG/ACT nasal spray SHAKE LIQUID AND USE 2 SPRAYS IN EACH NOSTRIL DAILY  . indapamide (LOZOL) 2.5 MG tablet Take one po q am Monday and Fridays.  Marland Kitchen loratadine (CLARITIN) 10 MG tablet Take 10 mg by mouth daily as needed for allergies.  Marland Kitchen meclizine (ANTIVERT) 25 MG tablet Take 1 tablet (25 mg total) by mouth 3 (three) times daily as needed for dizziness.  . Multiple Vitamin (MULTIVITAMIN WITH MINERALS) TABS tablet Take 1 tablet by mouth daily.  . ondansetron (ZOFRAN ODT) 4 MG disintegrating tablet 4mg  ODT q4 hours prn nausea/vomit  . OVER THE COUNTER MEDICATION Tums 1-2 prn  . potassium chloride SA (KLOR-CON) 20 MEQ tablet Take 1  tablet (20 mEq total) by mouth once for 1 dose. (Patient taking differently: Take 20 mEq by mouth daily.)   No facility-administered encounter medications on file as of 11/27/2020.     Review of Systems  Constitutional: Negative for chills and fever.  HENT: Negative for congestion, rhinorrhea and sore throat.   Respiratory: Negative for cough, shortness of breath and wheezing.   Cardiovascular: Negative for chest pain and leg swelling.  Gastrointestinal: Negative for abdominal pain, diarrhea, nausea and vomiting.  Genitourinary: Negative for dysuria and frequency.  Musculoskeletal: Negative for arthralgias and back pain.  Skin: Negative for rash.  Neurological: Negative for dizziness, weakness and headaches.     Vitals BP (!) 158/87   Pulse 95   Temp (!) 97.4 F (36.3 C)   Ht 5\' 2"  (1.575 m)   Wt 220 lb (99.8 kg)   SpO2 96%   BMI 40.24 kg/m   Objective:   Physical Exam Vitals and nursing note reviewed.  Constitutional:      Appearance: Normal appearance.  HENT:     Head: Normocephalic and atraumatic.  Eyes:     Extraocular Movements: Extraocular movements intact.     Conjunctiva/sclera: Conjunctivae normal.     Pupils: Pupils are equal, round, and reactive to light.  Cardiovascular:     Rate and Rhythm: Normal rate and regular rhythm.     Pulses: Normal pulses.  Heart sounds: Normal heart sounds.  Pulmonary:     Effort: Pulmonary effort is normal.     Breath sounds: Normal breath sounds. No wheezing, rhonchi or rales.  Musculoskeletal:        General: Normal range of motion.     Right lower leg: No edema.     Left lower leg: No edema.  Skin:    General: Skin is warm and dry.     Findings: No lesion or rash.  Neurological:     General: No focal deficit present.     Mental Status: She is alert and oriented to person, place, and time.  Psychiatric:        Mood and Affect: Mood normal.        Behavior: Behavior normal.      Assessment and Plan   1.  Diabetes mellitus without complication (HCC)  2. Primary hypertension    New dx of diabetes type 2- a1c at 7.1 Glucometer and test strips ordered 2x per day checking.  Call if seeing numbers over 200.  Pt to eat low carb diet and inc in exercising.  Going to let her try to do diet controlled for now.  May need medication if a1c is increasing.  htn- uncontrolled.  norvasc 10mg  hasn't had it yet today. Take norvasc when getting home. Pt taking indapamide 2x per week.   Seeing nutritionist and doing well.  Cholesterol- slight elevated. Cont to eat low cholesterol diet and inc in exercising. Recheck next visit.  F/u 14mo .    Return in about 3 months (around 02/27/2021) for f/u dm.

## 2020-11-27 NOTE — Patient Instructions (Signed)
Diabetes Mellitus and Nutrition, Adult When you have diabetes, or diabetes mellitus, it is very important to have healthy eating habits because your blood sugar (glucose) levels are greatly affected by what you eat and drink. Eating healthy foods in the right amounts, at about the same times every day, can help you:  Control your blood glucose.  Lower your risk of heart disease.  Improve your blood pressure.  Reach or maintain a healthy weight. What can affect my meal plan? Every person with diabetes is different, and each person has different needs for a meal plan. Your health care provider may recommend that you work with a dietitian to make a meal plan that is best for you. Your meal plan may vary depending on factors such as:  The calories you need.  The medicines you take.  Your weight.  Your blood glucose, blood pressure, and cholesterol levels.  Your activity level.  Other health conditions you have, such as heart or kidney disease. How do carbohydrates affect me? Carbohydrates, also called carbs, affect your blood glucose level more than any other type of food. Eating carbs naturally raises the amount of glucose in your blood. Carb counting is a method for keeping track of how many carbs you eat. Counting carbs is important to keep your blood glucose at a healthy level, especially if you use insulin or take certain oral diabetes medicines. It is important to know how many carbs you can safely have in each meal. This is different for every person. Your dietitian can help you calculate how many carbs you should have at each meal and for each snack. How does alcohol affect me? Alcohol can cause a sudden decrease in blood glucose (hypoglycemia), especially if you use insulin or take certain oral diabetes medicines. Hypoglycemia can be a life-threatening condition. Symptoms of hypoglycemia, such as sleepiness, dizziness, and confusion, are similar to symptoms of having too much  alcohol.  Do not drink alcohol if: ? Your health care provider tells you not to drink. ? You are pregnant, may be pregnant, or are planning to become pregnant.  If you drink alcohol: ? Do not drink on an empty stomach. ? Limit how much you use to:  0-1 drink a day for women.  0-2 drinks a day for men. ? Be aware of how much alcohol is in your drink. In the [place] one drink equals one 12 oz bottle of beer (355 mL), one 5 oz glass of wine (148 mL), or one 1 oz glass of hard liquor (44 mL). ? Keep yourself hydrated with water, diet soda, or unsweetened iced tea.  Keep in mind that regular soda, juice, and other mixers may contain a lot of sugar and must be counted as carbs. What are tips for following this plan? Reading food labels  Start by checking the serving size on the "Nutrition Facts" label of packaged foods and drinks. The amount of calories, carbs, fats, and other nutrients listed on the label is based on one serving of the item. Many items contain more than one serving per package.  Check the total grams (g) of carbs in one serving. You can calculate the number of servings of carbs in one serving by dividing the total carbs by 15. For example, if a food has 30 g of total carbs per serving, it would be equal to 2 servings of carbs.  Check the number of grams (g) of saturated fats and trans fats in one serving. Choose foods that have   a low amount or none of these fats.  Check the number of milligrams (mg) of salt (sodium) in one serving. Most people should limit total sodium intake to less than 2,300 mg per day.  Always check the nutrition information of foods labeled as "low-fat" or "nonfat." These foods may be higher in added sugar or refined carbs and should be avoided.  Talk to your dietitian to identify your daily goals for nutrients listed on the label. Shopping  Avoid buying canned, pre-made, or processed foods. These foods tend to be high in fat, sodium, and added  sugar.  Shop around the outside edge of the grocery store. This is where you will most often find fresh fruits and vegetables, bulk grains, fresh meats, and fresh dairy. Cooking  Use low-heat cooking methods, such as baking, instead of high-heat cooking methods like deep frying.  Cook using healthy oils, such as olive, canola, or sunflower oil.  Avoid cooking with butter, cream, or high-fat meats. Meal planning  Eat meals and snacks regularly, preferably at the same times every day. Avoid going long periods of time without eating.  Eat foods that are high in fiber, such as fresh fruits, vegetables, beans, and whole grains. Talk with your dietitian about how many servings of carbs you can eat at each meal.  Eat 4-6 oz (112-168 g) of lean protein each day, such as lean meat, chicken, fish, eggs, or tofu. One ounce (oz) of lean protein is equal to: ? 1 oz (28 g) of meat, chicken, or fish. ? 1 egg. ?  cup (62 g) of tofu.  Eat some foods each day that contain healthy fats, such as avocado, nuts, seeds, and fish.   What foods should I eat? Fruits Berries. Apples. Oranges. Peaches. Apricots. Plums. Grapes. Mango. Papaya. Pomegranate. Kiwi. Cherries. Vegetables Lettuce. Spinach. Leafy greens, including kale, chard, collard greens, and mustard greens. Beets. Cauliflower. Cabbage. Broccoli. Carrots. Green beans. Tomatoes. Peppers. Onions. Cucumbers. Brussels sprouts. Grains Whole grains, such as whole-wheat or whole-grain bread, crackers, tortillas, cereal, and pasta. Unsweetened oatmeal. Quinoa. Brown or wild rice. Meats and other proteins Seafood. Poultry without skin. Lean cuts of poultry and beef. Tofu. Nuts. Seeds. Dairy Low-fat or fat-free dairy products such as milk, yogurt, and cheese. The items listed above may not be a complete list of foods and beverages you can eat. Contact a dietitian for more information. What foods should I avoid? Fruits Fruits canned with  syrup. Vegetables Canned vegetables. Frozen vegetables with butter or cream sauce. Grains Refined white flour and flour products such as bread, pasta, snack foods, and cereals. Avoid all processed foods. Meats and other proteins Fatty cuts of meat. Poultry with skin. Breaded or fried meats. Processed meat. Avoid saturated fats. Dairy Full-fat yogurt, cheese, or milk. Beverages Sweetened drinks, such as soda or iced tea. The items listed above may not be a complete list of foods and beverages you should avoid. Contact a dietitian for more information. Questions to ask a health care provider  Do I need to meet with a diabetes educator?  Do I need to meet with a dietitian?  What number can I call if I have questions?  When are the best times to check my blood glucose? Where to find more information:  American Diabetes Association: diabetes.org  Academy of Nutrition and Dietetics: www.eatright.org  National Institute of Diabetes and Digestive and Kidney Diseases: www.niddk.nih.gov  Association of Diabetes Care and Education Specialists: www.diabeteseducator.org Summary  It is important to have healthy eating   habits because your blood sugar (glucose) levels are greatly affected by what you eat and drink.  A healthy meal plan will help you control your blood glucose and maintain a healthy lifestyle.  Your health care provider may recommend that you work with a dietitian to make a meal plan that is best for you.  Keep in mind that carbohydrates (carbs) and alcohol have immediate effects on your blood glucose levels. It is important to count carbs and to use alcohol carefully. This information is not intended to replace advice given to you by your health care provider. Make sure you discuss any questions you have with your health care provider. Document Revised: 08/22/2019 Document Reviewed: 08/22/2019 Elsevier Patient Education  2021 Elsevier Inc.  

## 2020-11-28 ENCOUNTER — Ambulatory Visit (INDEPENDENT_AMBULATORY_CARE_PROVIDER_SITE_OTHER): Payer: Medicare Other | Admitting: Psychologist

## 2020-11-28 ENCOUNTER — Encounter: Payer: Self-pay | Admitting: Family Medicine

## 2020-11-28 DIAGNOSIS — E119 Type 2 diabetes mellitus without complications: Secondary | ICD-10-CM

## 2020-11-28 DIAGNOSIS — F320 Major depressive disorder, single episode, mild: Secondary | ICD-10-CM

## 2020-11-28 HISTORY — DX: Type 2 diabetes mellitus without complications: E11.9

## 2020-12-03 ENCOUNTER — Other Ambulatory Visit: Payer: Self-pay | Admitting: *Deleted

## 2020-12-03 ENCOUNTER — Telehealth: Payer: Self-pay

## 2020-12-03 MED ORDER — BLOOD GLUCOSE METER KIT: PACK | 5 refills | Status: DC

## 2020-12-03 NOTE — Telephone Encounter (Signed)
Jessica Kirby was told by Dr Ladona Ridgel to check her sugar twice a day and test strips and needles are  wrote for once a day?   Pt call back [phone]

## 2020-12-03 NOTE — Telephone Encounter (Signed)
Script signed, faxed and pt was notified.

## 2020-12-03 NOTE — Telephone Encounter (Signed)
Script on door for twice a day testing if you agree

## 2020-12-04 DIAGNOSIS — M79672 Pain in left foot: Secondary | ICD-10-CM | POA: Diagnosis not present

## 2020-12-04 DIAGNOSIS — M79675 Pain in left toe(s): Secondary | ICD-10-CM | POA: Diagnosis not present

## 2020-12-04 DIAGNOSIS — M79671 Pain in right foot: Secondary | ICD-10-CM | POA: Diagnosis not present

## 2020-12-04 DIAGNOSIS — B351 Tinea unguium: Secondary | ICD-10-CM | POA: Diagnosis not present

## 2020-12-04 DIAGNOSIS — M79674 Pain in right toe(s): Secondary | ICD-10-CM | POA: Diagnosis not present

## 2020-12-13 ENCOUNTER — Ambulatory Visit (INDEPENDENT_AMBULATORY_CARE_PROVIDER_SITE_OTHER): Payer: Medicare Other | Admitting: Psychologist

## 2020-12-13 DIAGNOSIS — F320 Major depressive disorder, single episode, mild: Secondary | ICD-10-CM | POA: Diagnosis not present

## 2020-12-15 ENCOUNTER — Other Ambulatory Visit: Payer: Self-pay | Admitting: Family Medicine

## 2020-12-26 DIAGNOSIS — L03032 Cellulitis of left toe: Secondary | ICD-10-CM | POA: Diagnosis not present

## 2020-12-26 DIAGNOSIS — L600 Ingrowing nail: Secondary | ICD-10-CM | POA: Diagnosis not present

## 2020-12-26 DIAGNOSIS — M79672 Pain in left foot: Secondary | ICD-10-CM | POA: Diagnosis not present

## 2020-12-26 DIAGNOSIS — M79675 Pain in left toe(s): Secondary | ICD-10-CM | POA: Diagnosis not present

## 2020-12-30 ENCOUNTER — Ambulatory Visit (INDEPENDENT_AMBULATORY_CARE_PROVIDER_SITE_OTHER): Payer: Medicare Other | Admitting: Psychologist

## 2020-12-30 DIAGNOSIS — F320 Major depressive disorder, single episode, mild: Secondary | ICD-10-CM

## 2021-01-03 ENCOUNTER — Encounter: Payer: Medicare Other | Attending: Family Medicine | Admitting: Dietician

## 2021-01-03 ENCOUNTER — Encounter: Payer: Self-pay | Admitting: Dietician

## 2021-01-03 VITALS — Ht 62.0 in | Wt 205.8 lb

## 2021-01-03 DIAGNOSIS — E119 Type 2 diabetes mellitus without complications: Secondary | ICD-10-CM

## 2021-01-03 NOTE — Patient Instructions (Addendum)
Continue to stay away from fried foods and limit red meat to once a week.  Continue your weight loss at around 1-2 maximum pounds per week.   Try to increase your soluble fiber and potassium with foods from the handouts provided.  Consider taking a walk in the evening, or listening to rainforest/white noise to relax before bed.

## 2021-01-03 NOTE — Progress Notes (Signed)
Medical Nutrition Therapy  Appointment Start time:  [phone]  Appointment End time:  0915  Primary concerns today: Elevated blood sugar Referral diagnosis: E66.01 Class 3 severe obesity Preferred learning style: No preference indicated Learning readiness: Ready   NUTRITION ASSESSMENT   Anthropometrics  Ht: 5'2" Wt: 224.8 lbs Wt Change: -19 lbs since 11/17/20 Body mass index is 37.64 kg/m.   Clinical Medical Hx: Osteoporosis, HTN, GERD Medications: Amlodopine, famotidine, Potassium Chloride Labs: 11/12/2020 A1c - 7.1, Glucose - 290, TC - 210, LDL - 138 Notable Signs/Symptoms: Persistent reflux, low energy  Lifestyle & Dietary Hx Pt reports cutting back on carbs and portion sizes. Pt states they walk each morning for 30 minutes each morning. Pt reports stopping eating bread, ice cream, sweets.  Pt reports checking BG every morning and before bed. FBG ~ 95-110 CBG at night ~ 105-120 Pt reports having a BG of 88 one night and states they felt a little shaky. Pt reports they will be getting their blood work done in late June. Pt reports that when they eat something "bad", they have a smaller portion or trade off with more vegetables. Pt reports having a potassium deficiency, but is only on 1 potassium chloride pill a day. Pt reports still struggling with sleep, 4 hours a day  Estimated daily fluid intake: 128 oz Supplements: Daily MV Sleep: Up a lot, anxious and frequent urination Stress / self-care: 9/10, loss of husband of 48 years. Current average weekly physical activity: ADLs, walks for 3 minutes each morning  24-Hr Dietary Recall First Meal: Honey Nut cheerios,skim milk, 1/2 banana, coffee with Stevia and skim milk Snack: 1 bar of Halo protein ice cream Second Meal: Fried chicken breast, green beans, mashed potatoes, diet coke Snack: Apple Third Meal: Honey nut cheerios, skim milk, coffee Snack: none Beverages: water    NUTRITION DIAGNOSIS  NB-1.1 Food and  nutrition-related knowledge deficit As related to GERD.  As evidenced by self reported persistent reflux, sedentary lifestlye, irregular meal pattern, and intake of high fat/fried foods.   NUTRITION INTERVENTION  Nutrition education (E-1) on the following topics:  . GERD o Educate pt on foods that contribute to GERD symptoms. This includes high fat and fried foods, caffeine, spicy foods, and sodas. Educate pt on the importance of consistent meal times and physical activity. Educate pt on the role that stress plays on increasing reflux, and the importance of working on stress management. Advise pt to refrain from eating within 2-3 hours of laying down to sleep. Advise pt to sleep on their back with their head elevated to reduce reflux.  NEW:  Diabetes Educated patient on factors that contribute to elevation of blood sugars, such as stress, illness, injury,and food choices. Discussed the role that physical activity plays in lowering blood sugar. Discussed the importance of eating a consistent amount of carbohydrate throughout the day.   Cholesterol  Educate pt on factors that can elevate LDL cholesterol, including high dietary intake of saturated fats. Educate pt on identifying sources of saturated fats, and how to make alternative food choices to lower saturated fat intake.Educate pt on the role of physical activity in lowering cholesterol.  Handouts Provided Include   Balanced Plate  Yellow meal plan card  NEW: Soluble and Insoluble Fiber Food List  NEW: High Potassium Food List  Learning Style & Readiness for Change Teaching method utilized: Visual & Auditory  Demonstrated degree of understanding via: Teach Back  Barriers to learning/adherence to lifestyle change: none  Goals Established by  Pt  Continue to stay away from fried foods and limit red meat to once a week.  Continue your weight loss at around 1-2 maximum pounds per week.   Try to increase your soluble fiber and  potassium with foods from the handouts provided.  Consider taking a walk in the evening, or listening to rainforest/white noise to relax before bed.   MONITORING & EVALUATION Dietary intake, weekly physical activity, A1c, blood lipids, and weight loss in 3 months.  Next Steps  Patient is to follow up with dietitian.

## 2021-01-09 DIAGNOSIS — M79672 Pain in left foot: Secondary | ICD-10-CM | POA: Diagnosis not present

## 2021-01-09 DIAGNOSIS — M79675 Pain in left toe(s): Secondary | ICD-10-CM | POA: Diagnosis not present

## 2021-01-09 DIAGNOSIS — L600 Ingrowing nail: Secondary | ICD-10-CM | POA: Diagnosis not present

## 2021-01-27 ENCOUNTER — Ambulatory Visit (INDEPENDENT_AMBULATORY_CARE_PROVIDER_SITE_OTHER): Payer: Medicare Other | Admitting: Psychologist

## 2021-01-27 DIAGNOSIS — F320 Major depressive disorder, single episode, mild: Secondary | ICD-10-CM | POA: Diagnosis not present

## 2021-01-30 ENCOUNTER — Telehealth: Payer: Self-pay | Admitting: *Deleted

## 2021-01-30 DIAGNOSIS — R5383 Other fatigue: Secondary | ICD-10-CM

## 2021-01-30 NOTE — Telephone Encounter (Signed)
Patient notified

## 2021-01-30 NOTE — Telephone Encounter (Signed)
Pt called and states she woke up today and felt weak all over.no weakness on one side, no headache, no pain, no fever, no sob, no slurred speech. Just said she was weak. States she felt this way when her potassium was low in the past. Also she was concerned about her magnessium level. Said she had a cramp in back of leg. No sob, no leg swelling. States her feet did swell about a week ago but she took her fluid pill and that did help the swelling in feet. Fasting sugar this morning was 100 and bp was 144/87. States she has been cutting back on sugar and starches in diet and does not know if that is why she feels weak. Wanted appt for today. None available. Advised to go to urgent care for appt. Pt states she would rather wait and come here. appt given for tomorrow with karen. Advised urgent care or ED if worse. Do you want to order any bw for appt tomorrow or wait to see pt first?

## 2021-01-30 NOTE — Telephone Encounter (Signed)
Blood work ordered in Epic. Left message to return call 

## 2021-01-31 ENCOUNTER — Other Ambulatory Visit: Payer: Self-pay

## 2021-01-31 ENCOUNTER — Ambulatory Visit (INDEPENDENT_AMBULATORY_CARE_PROVIDER_SITE_OTHER): Payer: Medicare Other | Admitting: Family Medicine

## 2021-01-31 ENCOUNTER — Encounter: Payer: Self-pay | Admitting: Family Medicine

## 2021-01-31 VITALS — BP 138/84 | Temp 97.3°F | Ht 62.0 in

## 2021-01-31 DIAGNOSIS — R350 Frequency of micturition: Secondary | ICD-10-CM

## 2021-01-31 LAB — CBC WITH DIFFERENTIAL/PLATELET
Basophils Absolute: 0.1 10*3/uL (ref 0.0–0.2)
Basos: 1 %
EOS (ABSOLUTE): 0.2 10*3/uL (ref 0.0–0.4)
Eos: 2 %
Hematocrit: 43.5 % (ref 34.0–46.6)
Hemoglobin: 14.3 g/dL (ref 11.1–15.9)
Immature Grans (Abs): 0 10*3/uL (ref 0.0–0.1)
Immature Granulocytes: 0 %
Lymphocytes Absolute: 4.4 10*3/uL — ABNORMAL HIGH (ref 0.7–3.1)
Lymphs: 42 %
MCH: 28 pg (ref 26.6–33.0)
MCHC: 32.9 g/dL (ref 31.5–35.7)
MCV: 85 fL (ref 79–97)
Monocytes Absolute: 0.6 10*3/uL (ref 0.1–0.9)
Monocytes: 6 %
Neutrophils Absolute: 5.2 10*3/uL (ref 1.4–7.0)
Neutrophils: 49 %
Platelets: 285 10*3/uL (ref 150–450)
RBC: 5.11 x10E6/uL (ref 3.77–5.28)
RDW: 13 % (ref 11.7–15.4)
WBC: 10.5 10*3/uL (ref 3.4–10.8)

## 2021-01-31 LAB — COMPREHENSIVE METABOLIC PANEL
ALT: 16 IU/L (ref 0–32)
AST: 15 IU/L (ref 0–40)
Albumin/Globulin Ratio: 1.8 (ref 1.2–2.2)
Albumin: 4.6 g/dL (ref 3.8–4.8)
Alkaline Phosphatase: 94 IU/L (ref 44–121)
BUN/Creatinine Ratio: 13 (ref 12–28)
BUN: 12 mg/dL (ref 8–27)
Bilirubin Total: 0.7 mg/dL (ref 0.0–1.2)
CO2: 23 mmol/L (ref 20–29)
Calcium: 9.7 mg/dL (ref 8.7–10.3)
Chloride: 102 mmol/L (ref 96–106)
Creatinine, Ser: 0.9 mg/dL (ref 0.57–1.00)
Globulin, Total: 2.5 g/dL (ref 1.5–4.5)
Glucose: 102 mg/dL — ABNORMAL HIGH (ref 65–99)
Potassium: 4.2 mmol/L (ref 3.5–5.2)
Sodium: 142 mmol/L (ref 134–144)
Total Protein: 7.1 g/dL (ref 6.0–8.5)
eGFR: 71 mL/min/{1.73_m2} (ref >59)

## 2021-01-31 LAB — POCT URINALYSIS DIP (MANUAL ENTRY)
Bilirubin, UA: NEGATIVE
Blood, UA: NEGATIVE
Glucose, UA: NEGATIVE mg/dL
Ketones, POC UA: NEGATIVE mg/dL
Leukocytes, UA: NEGATIVE
Nitrite, UA: NEGATIVE
Protein Ur, POC: NEGATIVE mg/dL
Spec Grav, UA: 1.01 (ref 1.010–1.025)
Urobilinogen, UA: 0.2 E.U./dL
pH, UA: 6 (ref 5.0–8.0)

## 2021-01-31 LAB — MAGNESIUM: Magnesium: 2.1 mg/dL (ref 1.6–2.3)

## 2021-01-31 NOTE — Progress Notes (Signed)
 Patient ID: Jessica Kirby, female    DOB: 05/19/1956, 65 y.o.   MRN: [id number]   Chief Complaint  Patient presents with  . muscle weakness    Yesterday , states she had a 4 covid vaccine 1 week a go , and has not been feeling well , had cyst L shoulder resolved    Subjective:  CC: weakness, had #4 Covid vaccine one week ago  This is a new problem.  Presents today with a complaint of muscle weakness, low potassium, urinary burning.  Reports that yesterday she started feeling weak and burning started around 2 weeks ago.  Reports that she received her number for COVID vaccination about 1 week ago.  Reports that her blood sugar this morning was 82.  She took a fluid pill 3 days ago when her ankles were swollen, ankle swelling has resolved.  Denies fever, chills, chest pain, shortness of breath.  Endorses constipation, urinary dysuria and urgency.  Worries that she has low potassium level.  Labs done yesterday.  Will review in detail in the office today.    Medical History Porcia has a past medical history of Allergy, Anxiety, Depression, Diabetes mellitus without complication (HCC) (11/28/2020), GERD (gastroesophageal reflux disease), Herniated disc, Hypertension, Hypothyroidism, Osteoporosis, and Thyroid disease.   Outpatient Encounter Medications as of 01/31/2021  Medication Sig  . ALPRAZolam (XANAX) 0.5 MG tablet Take 1 tablet (0.5 mg total) by mouth at bedtime as needed for anxiety.  . amLODipine (NORVASC) 10 MG tablet TAKE 1 TABLET(10 MG) BY MOUTH DAILY  . aspirin EC 81 MG tablet Take 81 mg by mouth daily.   . blood glucose meter kit and supplies Dispense based on patient and insurance preference. Use to test glucose twice daily  (FOR ICD-10,  E11.9).  . famotidine (PEPCID) 20 MG tablet Take 1 tablet (20 mg total) by mouth as needed for heartburn or indigestion.  . fluticasone (FLONASE) 50 MCG/ACT nasal spray SHAKE LIQUID AND USE 2 SPRAYS IN EACH NOSTRIL DAILY  . indapamide (LOZOL) 2.5 MG  tablet Take one po q am Monday and Fridays.  . loratadine (CLARITIN) 10 MG tablet Take 10 mg by mouth daily as needed for allergies.  . meclizine (ANTIVERT) 25 MG tablet Take 1 tablet (25 mg total) by mouth 3 (three) times daily as needed for dizziness.  . Multiple Vitamin (MULTIVITAMIN WITH MINERALS) TABS tablet Take 1 tablet by mouth daily.  . ondansetron (ZOFRAN ODT) 4 MG disintegrating tablet 4mg ODT q4 hours prn nausea/vomit  . OVER THE COUNTER MEDICATION Tums 1-2 prn  . potassium chloride SA (KLOR-CON) 20 MEQ tablet Take 1 tablet (20 mEq total) by mouth once for 1 dose. (Patient taking differently: Take 20 mEq by mouth daily.)  . [DISCONTINUED] amoxicillin-clavulanate (AUGMENTIN) 875-125 MG tablet Take 1 tablet by mouth 2 (two) times daily.   No facility-administered encounter medications on file as of 01/31/2021.     Review of Systems  Constitutional: Negative for chills and fever.  HENT: Negative for congestion.   Respiratory: Negative for cough and shortness of breath.   Cardiovascular: Negative for chest pain.  Gastrointestinal: Positive for constipation. Negative for abdominal pain, diarrhea, nausea and vomiting.  Genitourinary: Positive for dysuria and urgency. Negative for decreased urine volume and hematuria.     Vitals BP 138/84   Temp (!) 97.3 F (36.3 C)   Ht 5' 2" (1.575 m)   BMI 37.64 kg/m   Objective:   Physical Exam Vitals reviewed.  Constitutional:        Appearance: Normal appearance.  Cardiovascular:     Rate and Rhythm: Normal rate and regular rhythm.     Heart sounds: Normal heart sounds.  Pulmonary:     Effort: Pulmonary effort is normal.     Breath sounds: Normal breath sounds.  Abdominal:     General: Bowel sounds are normal.  Skin:    General: Skin is warm and dry.  Neurological:     General: No focal deficit present.     Mental Status: She is alert.  Psychiatric:        Behavior: Behavior normal.    Results for orders placed or  performed in visit on 01/31/21  POCT urinalysis dipstick  Result Value Ref Range   Color, UA yellow yellow   Clarity, UA clear clear   Glucose, UA negative negative mg/dL   Bilirubin, UA negative negative   Ketones, POC UA negative negative mg/dL   Spec Grav, UA 1.010 1.010 - 1.025   Blood, UA negative negative   pH, UA 6.0 5.0 - 8.0   Protein Ur, POC negative negative mg/dL   Urobilinogen, UA 0.2 0.2 or 1.0 E.U./dL   Nitrite, UA Negative Negative   Leukocytes, UA Negative Negative      Assessment and Plan   1. Frequent urination - POCT urinalysis dipstick   Lab work reviewed in detail, no hypokalemia, no abnormalities noted.  Kidney function normal, liver enzymes normal, no indication of infectious process or anemia on CBC.  Urine dipstick negative for infection.  It is possible this is a side effect of having her second booster dose of COVID-vaccine.  Lab work within normal limits, this is comforting to her.  Recommend supportive therapy, adequate rest, adequate hydration, and wait this out to see if her symptoms improve.  She is not toxic appearing, no fever, no chills, no chest pain, no shortness of breath.  All lab work within normal limits.  Agrees with plan of care discussed today. Understands warning signs to seek further care: chest pain, shortness of breath, any significant change in health.  Understands to follow-up Monday if symptoms do not improve with rest and supportive therapy over the weekend.  She will notify us for an appointment on Monday.   Pecolia Ades, NP 01/31/2021

## 2021-01-31 NOTE — Patient Instructions (Signed)
Bland Diet A bland diet consists of foods that are often soft and do not have a lot of fat, fiber, or extra seasonings. Foods without fat, fiber, or seasoning are easier for the body to digest. They are also less likely to irritate your mouth, throat, stomach, and other parts of your digestive system. A bland diet is sometimes called a BRAT diet. What is my plan? Your health care provider or food and nutrition specialist (dietitian) may recommend specific changes to your diet to prevent symptoms or to treat your symptoms. These changes may include:  Eating small meals often.  Cooking food until it is soft enough to chew easily.  Chewing your food well.  Drinking fluids slowly.  Not eating foods that are very spicy, sour, or fatty.  Not eating citrus fruits, such as oranges and grapefruit. What do I need to know about this diet?  Eat a variety of foods from the bland diet food list.  Do not follow a bland diet longer than needed.  Ask your health care provider whether you should take vitamins or supplements. What foods can I eat? Grains Hot cereals, such as cream of wheat. Rice. Bread, crackers, or tortillas made from refined white flour.   Vegetables Canned or cooked vegetables. Mashed or boiled potatoes. Fruits Bananas. Applesauce. Other types of cooked or canned fruit with the skin and seeds removed, such as canned peaches or pears.   Meats and other proteins Scrambled eggs. Creamy peanut butter or other nut butters. Lean, well-cooked meats, such as chicken or fish. Tofu. Soups or broths.   Dairy Low-fat dairy products, such as milk, cottage cheese, or yogurt. Beverages Water. Herbal tea. Apple juice.   Fats and oils Mild salad dressings. Canola or olive oil. Sweets and desserts Pudding. Custard. Fruit gelatin. Ice cream. The items listed above may not be a complete list of recommended foods and beverages. Contact a dietitian for more options. What foods are not  recommended? Grains Whole grain breads and cereals. Vegetables Raw vegetables. Fruits Raw fruits, especially citrus, berries, or dried fruits. Dairy Whole fat dairy foods. Beverages Caffeinated drinks. Alcohol. Seasonings and condiments Strongly flavored seasonings or condiments. Hot sauce. Salsa. Other foods Spicy foods. Fried foods. Sour foods, such as pickled or fermented foods. Foods with high sugar content. Foods high in fiber. The items listed above may not be a complete list of foods and beverages to avoid. Contact a dietitian for more information. Summary  A bland diet consists of foods that are often soft and do not have a lot of fat, fiber, or extra seasonings.  Foods without fat, fiber, or seasoning are easier for the body to digest.  Check with your health care provider to see how long you should follow this diet plan. It is not meant to be followed for long periods. This information is not intended to replace advice given to you by your health care provider. Make sure you discuss any questions you have with your health care provider. Document Revised: 10/13/2017 Document Reviewed: 10/13/2017 Elsevier Patient Education  2021 Elsevier Inc.  

## 2021-02-03 ENCOUNTER — Other Ambulatory Visit: Payer: Self-pay | Admitting: Family Medicine

## 2021-02-12 ENCOUNTER — Other Ambulatory Visit: Payer: Self-pay

## 2021-02-12 ENCOUNTER — Ambulatory Visit
Admission: EM | Admit: 2021-02-12 | Discharge: 2021-02-12 | Disposition: A | Discharge status: Home / Self Care | Payer: Medicare Other | Attending: Family Medicine | Admitting: Family Medicine

## 2021-02-12 ENCOUNTER — Encounter: Payer: Self-pay | Admitting: Emergency Medicine

## 2021-02-12 DIAGNOSIS — L739 Follicular disorder, unspecified: Secondary | ICD-10-CM | POA: Diagnosis not present

## 2021-02-12 MED ORDER — DOXYCYCLINE HYCLATE 100 MG PO CAPS: 100.0000 mg | ORAL_CAPSULE | Freq: Two times a day (BID) | ORAL | 0 refills | Status: DC

## 2021-02-12 NOTE — Discharge Instructions (Addendum)
I have sent in doxycycline for you to take one tablet twice a day for 10 days  Follow up with this office or with primary care if symptoms are persisting.  Follow up in the ER for high fever, trouble swallowing, trouble breathing, other concerning symptoms.  

## 2021-02-12 NOTE — ED Triage Notes (Signed)
Poss abscess to RT groin that came up today

## 2021-02-18 NOTE — ED Provider Notes (Signed)
[facility]    CSN: [id number] Arrival date & time: 02/12/21  1844      History   Chief Complaint Chief Complaint  Patient presents with  . Abscess    HPI Jessica Kirby is a 65 y.o. female.   Reports that she has a painful bump to her pubic area just above the vagina.  Denies previous symptoms.  Has not attempted OTC treatment.  Denies heat, tenderness, drainage from the area, chills, fever, other rash, other symptoms.  ROS per HPI  The history is provided by the patient.  Abscess   Past Medical History:  Diagnosis Date  . Allergy   . Anxiety    Insomnia  . Depression    Insomnia  . Diabetes mellitus without complication ([place]) 11/29/74  . GERD (gastroesophageal reflux disease)   . Herniated disc   . Hypertension   . Hypothyroidism    not medicated at this time  . Osteoporosis   . Thyroid disease    Hypothyroidism    Patient Active Problem List   Diagnosis Date Noted  . Diabetes mellitus without complication ([place]) 22/63/3354  . Constipation 10/03/2020  . Acute non-recurrent maxillary sinusitis 07/09/2020  . Dysuria 05/21/2020  . Acute midline low back pain without sciatica 05/21/2020  . Vitamin D deficiency 02/16/2020  . Diverticulosis of colon without hemorrhage   . Anxiety 11/04/2015  . Primary osteoarthritis of both hands 11/04/2015  . Chest pain 07/20/2015  . Hypokalemia 07/20/2015  . Pain in the chest   . Esophageal reflux   . Morbid obesity due to excess calories ([place])   . Persistent cough 07/04/2015  . GERD (gastroesophageal reflux disease) 09/27/2013  . Hypothyroidism 12/24/2012  . 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 Hearing decreased 12/24/2012  . Depression with anxiety 12/24/2012  . Hypertension   . Osteopenia     Past Surgical History:  Procedure Laterality Date  . ABDOMINAL HYSTERECTOMY    . COLONOSCOPY N/A 04/07/2016   Procedure: COLONOSCOPY;  Surgeon: Daneil Dolin, MD;  [facility];  Service: Endoscopy;   Laterality: N/A;  8:30 AM  . TUBAL LIGATION    . WRIST SURGERY      OB History   No obstetric history on file.      Home Medications    Prior to Admission medications   Medication Sig Start Date End Date Taking? Authorizing Provider  doxycycline (VIBRAMYCIN) 100 MG capsule Take 1 capsule (100 mg total) by mouth 2 (two) times daily. 02/12/21  Yes Faustino Congress, NP  ALPRAZolam [facility]) 0.5 MG tablet TAKE 1 TABLET(0.5 MG) BY MOUTH AT BEDTIME AS NEEDED FOR ANXIETY 02/04/21   Lovena Le, Malena M, DO  amLODipine (NORVASC) 10 MG tablet TAKE 1 TABLET(10 MG) BY MOUTH DAILY 12/16/20   Elvia Collum M, DO  aspirin EC 81 MG tablet Take 81 mg by mouth daily.     [provider]  blood glucose meter kit and supplies Dispense based on patient and insurance preference. Use to test glucose twice daily  (FOR ICD-10,  E11.9). 12/03/20   Elvia Collum M, DO  famotidine (PEPCID) 20 MG tablet Take 1 tablet (20 mg total) by mouth as needed for heartburn or indigestion. 10/03/20   Harvel Quale, MD  fluticasone [facility]) 50 MCG/ACT nasal spray SHAKE LIQUID AND USE 2 SPRAYS IN [facility] NOSTRIL DAILY 09/13/20   Elvia Collum M, DO  indapamide (LOZOL) 2.5 MG tablet Take one po q am Monday and Fridays. 03/22/20   Lovena Le,  Malena M, DO  loratadine (CLARITIN) 10 MG tablet Take 10 mg by mouth daily as needed for allergies.    [provider]  meclizine (ANTIVERT) 25 MG tablet Take 1 tablet (25 mg total) by mouth 3 (three) times daily as needed for dizziness. 03/15/20   Milton Ferguson, MD  Multiple Vitamin (MULTIVITAMIN WITH MINERALS) TABS tablet Take 1 tablet by mouth daily.    [provider]  ondansetron (ZOFRAN ODT) 4 MG disintegrating tablet 34m ODT q4 hours prn nausea/vomit 03/15/20   ZMilton Ferguson MD  OVER THE COUNTER MEDICATION Tums 1-2 prn    [provider]  potassium chloride SA (KLOR-CON) 20 MEQ tablet Take 1 tablet (20 mEq total) by mouth once for 1 dose. Patient  taking differently: Take 20 mEq by mouth daily. 06/19/20 06/19/20  TErven Colla DO    Family History Family History  Problem Relation Age of Onset  . Diabetes Father   . Heart attack Mother   . Heart disease Mother   . Bladder Cancer Other   . Colon cancer Neg Hx     Social History Social History   Tobacco Use  . Smoking status: Passive Smoke Exposure - Never Smoker  . Smokeless tobacco: Never Used  Vaping Use  . Vaping Use: Never used  Substance Use Topics  . Alcohol use: No    Alcohol/week: 0.0 standard drinks  . Drug use: No     Allergies   Fosamax [alendronate sodium], Vioxx [rofecoxib], Losartan, and Sulfa antibiotics   Review of Systems Review of Systems   Physical Exam Triage Vital Signs ED Triage Vitals  Enc Vitals Group     BP 02/12/21 1923 (!) 145/83     Pulse Rate 02/12/21 1923 81     Resp 02/12/21 1923 18     Temp 02/12/21 1923 98 F (36.7 C)     Temp Source 02/12/21 1923 Oral     SpO2 02/12/21 1923 95 %     Weight --      Height --      Head Circumference --      Peak Flow --      Pain Score 02/12/21 1922 7     Pain Loc --      Pain Edu? --      Excl. in [place] --    No data found.  Updated Vital Signs BP (!) 145/83 (BP Location: Right Arm)   Pulse 81   Temp 98 F (36.7 C) (Oral)   Resp 18   SpO2 95%   Visual Acuity Right Eye Distance:   Left Eye Distance:   Bilateral Distance:    Right Eye Near:   Left Eye Near:    Bilateral Near:     Physical Exam Vitals and nursing note reviewed.  Constitutional:      General: She is not in acute distress.    Appearance: Normal appearance. She is well-developed. She is not ill-appearing.  HENT:     Head: Normocephalic and atraumatic.     Nose: Nose normal.     Mouth/Throat:     Mouth: Mucous membranes are moist.     Pharynx: Oropharynx is clear.  Eyes:     Extraocular Movements: Extraocular movements intact.     Conjunctiva/sclera: Conjunctivae normal.     Pupils: Pupils are  equal, round, and reactive to light.  Cardiovascular:     Rate and Rhythm: Normal rate and regular rhythm.     Heart sounds: No murmur  heard.   Pulmonary:     Effort: Pulmonary effort is normal. No respiratory distress.     Breath sounds: Normal breath sounds.  Abdominal:     Palpations: Abdomen is soft.     Tenderness: There is no abdominal tenderness.  Genitourinary:   Musculoskeletal:        General: Normal range of motion.     Cervical back: Normal range of motion and neck supple.  Skin:    General: Skin is warm and dry.     Capillary Refill: Capillary refill takes less than 2 seconds.  Neurological:     General: No focal deficit present.     Mental Status: She is alert and oriented to person, place, and time.  Psychiatric:        Mood and Affect: Mood normal.        Behavior: Behavior normal.        Thought Content: Thought content normal.      UC Treatments / Results  Labs (all labs ordered are listed, but only abnormal results are displayed) Labs Reviewed - No data to display  EKG   Radiology No results found.  Procedures Procedures (including critical care time)  Medications Ordered in UC Medications - No data to display  Initial Impression / Assessment and Plan / UC Course  I have reviewed the triage vital signs and the nursing notes.  Pertinent labs & imaging results that were available during my care of the patient were reviewed by me and considered in my medical decision making (see chart for details).    Folliculitis  Prescribe doxycycline twice daily x10 days May use warm compresses to the area to help facilitate drainage as needed Follow up with this office or with primary care if symptoms are persisting.  Follow up in the ER for high fever, trouble swallowing, trouble breathing, other concerning symptoms.   Final Clinical Impressions(s) / UC Diagnoses   Final diagnoses:  Folliculitis     Discharge Instructions     I have sent in  doxycycline for you to take one tablet twice a day for 10 days.  Follow up with this office or with primary care if symptoms are persisting.  Follow up in the ER for high fever, trouble swallowing, trouble breathing, other concerning symptoms.     ED Prescriptions    Medication Sig Dispense Auth. Provider   doxycycline (VIBRAMYCIN) 100 MG capsule Take 1 capsule (100 mg total) by mouth 2 (two) times daily. 14 capsule Faustino Congress, NP     PDMP not reviewed this encounter.   Faustino Congress, NP 02/18/21 1714

## 2021-02-26 DIAGNOSIS — M79674 Pain in right toe(s): Secondary | ICD-10-CM | POA: Diagnosis not present

## 2021-02-26 DIAGNOSIS — M79671 Pain in right foot: Secondary | ICD-10-CM | POA: Diagnosis not present

## 2021-02-26 DIAGNOSIS — M79675 Pain in left toe(s): Secondary | ICD-10-CM | POA: Diagnosis not present

## 2021-02-26 DIAGNOSIS — E1140 Type 2 diabetes mellitus with diabetic neuropathy, unspecified: Secondary | ICD-10-CM | POA: Diagnosis not present

## 2021-02-26 DIAGNOSIS — M79672 Pain in left foot: Secondary | ICD-10-CM | POA: Diagnosis not present

## 2021-03-02 ENCOUNTER — Other Ambulatory Visit: Payer: Self-pay

## 2021-03-02 ENCOUNTER — Encounter: Payer: Self-pay | Admitting: Emergency Medicine

## 2021-03-02 ENCOUNTER — Ambulatory Visit
Admission: EM | Admit: 2021-03-02 | Discharge: 2021-03-02 | Disposition: A | Discharge status: Home / Self Care | Payer: Medicare Other | Attending: Family Medicine | Admitting: Family Medicine

## 2021-03-02 DIAGNOSIS — M549 Dorsalgia, unspecified: Secondary | ICD-10-CM | POA: Insufficient documentation

## 2021-03-02 DIAGNOSIS — L209 Atopic dermatitis, unspecified: Secondary | ICD-10-CM | POA: Diagnosis not present

## 2021-03-02 LAB — POCT URINALYSIS DIP (MANUAL ENTRY)
Bilirubin, UA: NEGATIVE
Blood, UA: NEGATIVE
Glucose, UA: NEGATIVE mg/dL
Leukocytes, UA: NEGATIVE
Nitrite, UA: NEGATIVE
Protein Ur, POC: NEGATIVE mg/dL
Spec Grav, UA: 1.02 (ref 1.010–1.025)
Urobilinogen, UA: 0.2 E.U./dL
pH, UA: 6.5 (ref 5.0–8.0)

## 2021-03-02 MED ORDER — TRIAMCINOLONE ACETONIDE 0.1 % EX CREA: 1.0000 "application " | TOPICAL_CREAM | Freq: Two times a day (BID) | CUTANEOUS | 0 refills | Status: DC

## 2021-03-02 NOTE — Discharge Instructions (Addendum)
Your urine is negative for bacteria. Recommend treating back pain as muscular with extra strength Tylenol and applications of heat.  If back pain persist follow-up with your primary care provider.  Triamcinolone cream for the rash.

## 2021-03-02 NOTE — ED Provider Notes (Signed)
[facility]    CSN: [id number] Arrival date & time: 03/02/21  1055      History   Chief Complaint Chief Complaint  Patient presents with  . Rash    HPI Jessica Kirby is a 65 y.o. female.   HPI  Patient presents today for evaluation of a rash between her breasts and also on her mid chest.  The rash has been pruritic.  Patient is a diabetic.  She also complains of some right flank/mid back pain. She denies any urinary symptoms.   Past Medical History:  Diagnosis Date  . Allergy   . Anxiety    Insomnia  . Depression    Insomnia  . Diabetes mellitus without complication ([place]) 03/01/3328  . GERD (gastroesophageal reflux disease)   . Herniated disc   . Hypertension   . Hypothyroidism    not medicated at this time  . Osteoporosis   . Thyroid disease    Hypothyroidism    Patient Active Problem List   Diagnosis Date Noted  . Diabetes mellitus without complication ([place]) 51/88/4166  . Constipation 10/03/2020  . Acute non-recurrent maxillary sinusitis 07/09/2020  . Dysuria 05/21/2020  . Acute midline low back pain without sciatica 05/21/2020  . Vitamin D deficiency 02/16/2020  . Diverticulosis of colon without hemorrhage   . Anxiety 11/04/2015  . Primary osteoarthritis of both hands 11/04/2015  . Chest pain 07/20/2015  . Hypokalemia 07/20/2015  . Pain in the chest   . Esophageal reflux   . Morbid obesity due to excess calories ([place])   . Persistent cough 07/04/2015  . GERD (gastroesophageal reflux disease) 09/27/2013  . Hypothyroidism 12/24/2012  . 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 Hearing decreased 12/24/2012  . Depression with anxiety 12/24/2012  . Hypertension   . Osteopenia     Past Surgical History:  Procedure Laterality Date  . ABDOMINAL HYSTERECTOMY    . COLONOSCOPY N/A 04/07/2016   Procedure: COLONOSCOPY;  Surgeon: Daneil Dolin, MD;  [facility];  Service: Endoscopy;  Laterality: N/A;  8:30 AM  . TUBAL LIGATION    . WRIST  SURGERY      OB History   No obstetric history on file.      Home Medications    Prior to Admission medications   Medication Sig Start Date End Date Taking? Authorizing Provider  ALPRAZolam (XANAX) 0.5 MG tablet TAKE 1 TABLET(0.5 MG) BY MOUTH AT BEDTIME AS NEEDED FOR ANXIETY 02/04/21   Lovena Le, Malena M, DO  amLODipine (NORVASC) 10 MG tablet TAKE 1 TABLET(10 MG) BY MOUTH DAILY 12/16/20   Elvia Collum M, DO  aspirin EC 81 MG tablet Take 81 mg by mouth daily.     [provider]  blood glucose meter kit and supplies Dispense based on patient and insurance preference. Use to test glucose twice daily  (FOR ICD-10,  E11.9). 12/03/20   Elvia Collum M, DO  doxycycline (VIBRAMYCIN) 100 MG capsule Take 1 capsule (100 mg total) by mouth 2 (two) times daily. 02/12/21   Faustino Congress, NP  famotidine (PEPCID) 20 MG tablet Take 1 tablet (20 mg total) by mouth as needed for heartburn or indigestion. 10/03/20   Harvel Quale, MD  fluticasone [facility]) 50 MCG/ACT nasal spray SHAKE LIQUID AND USE 2 SPRAYS IN [facility] NOSTRIL DAILY 09/13/20   Elvia Collum M, DO  indapamide (LOZOL) 2.5 MG tablet Take one po q am Monday and Fridays. 03/22/20   Elvia Collum M, DO  loratadine (CLARITIN) 10 MG  tablet Take 10 mg by mouth daily as needed for allergies.    [provider]  meclizine (ANTIVERT) 25 MG tablet Take 1 tablet (25 mg total) by mouth 3 (three) times daily as needed for dizziness. 03/15/20   Milton Ferguson, MD  Multiple Vitamin (MULTIVITAMIN WITH MINERALS) TABS tablet Take 1 tablet by mouth daily.    [provider]  ondansetron (ZOFRAN ODT) 4 MG disintegrating tablet 66m ODT q4 hours prn nausea/vomit 03/15/20   ZMilton Ferguson MD  OVER THE COUNTER MEDICATION Tums 1-2 prn    [provider]  potassium chloride SA (KLOR-CON) 20 MEQ tablet Take 1 tablet (20 mEq total) by mouth once for 1 dose. Patient taking differently: Take 20 mEq by mouth daily. 06/19/20 06/19/20   TErven Colla DO    Family History Family History  Problem Relation Age of Onset  . Diabetes Father   . Heart attack Mother   . Heart disease Mother   . Bladder Cancer Other   . Colon cancer Neg Hx     Social History Social History   Tobacco Use  . Smoking status: Passive Smoke Exposure - Never Smoker  . Smokeless tobacco: Never Used  Vaping Use  . Vaping Use: Never used  Substance Use Topics  . Alcohol use: No    Alcohol/week: 0.0 standard drinks  . Drug use: No     Allergies   Fosamax [alendronate sodium], Vioxx [rofecoxib], Losartan, and Sulfa antibiotics   Review of Systems Review of Systems Pertinent negatives listed in HPI   Physical Exam Triage Vital Signs ED Triage Vitals  Enc Vitals Group     BP 03/02/21 1200 (!) 170/89     Pulse Rate 03/02/21 1200 73     Resp 03/02/21 1200 18     Temp 03/02/21 1200 98.1 F (36.7 C)     Temp Source 03/02/21 1200 Oral     SpO2 03/02/21 1200 96 %     Weight --      Height --      Head Circumference --      Peak Flow --      Pain Score 03/02/21 1159 8     Pain Loc --      Pain Edu? --      Excl. in [place] --    No data found.  Updated Vital Signs BP (!) 170/89 (BP Location: Right Arm)   Pulse 73   Temp 98.1 F (36.7 C) (Oral)   Resp 18   SpO2 96%   Visual Acuity Right Eye Distance:   Left Eye Distance:   Bilateral Distance:    Right Eye Near:   Left Eye Near:    Bilateral Near:     Physical Exam Constitutional:      Appearance: Normal appearance. She is not ill-appearing.  HENT:     Head: Normocephalic.  Cardiovascular:     Rate and Rhythm: Normal rate and regular rhythm.  Pulmonary:     Effort: Pulmonary effort is normal.     Breath sounds: Normal breath sounds and air entry.  Chest:    Musculoskeletal:       Arms:  Psychiatric:        Attention and Perception: Attention normal.        Mood and Affect: Mood normal.        Speech: Speech normal.        Behavior: Behavior is  cooperative.    .  UC Treatments / Results  Labs (all labs ordered are listed, but only abnormal results are displayed) Labs Reviewed - No data to display  EKG   Radiology No results found.  Procedures Procedures (including critical care time)  Medications Ordered in UC Medications - No data to display  Initial Impression / Assessment and Plan / UC Course  I have reviewed the triage vital signs and the nursing notes.  Pertinent labs & imaging results that were available during my care of the patient were reviewed by me and considered in my medical decision making (see chart for details).     Atopic dermatitis treatment with triamcinolone cream.  Mid back pain UA negative.  Recommend Tylenol and applications of heat.  Follow-up with PCP if symptoms worsen or do not improve. Final Clinical Impressions(s) / UC Diagnoses   Final diagnoses:  Atopic dermatitis, unspecified type  Mid back pain on right side     Discharge Instructions     Your urine is negative for bacteria. Recommend treating back pain as muscular with extra strength Tylenol and applications of heat.  If back pain persist follow-up with your primary care provider.  Triamcinolone cream for the rash.    ED Prescriptions    Medication Sig Dispense Auth. Provider   triamcinolone cream (KENALOG) 0.1 % Apply 1 application topically 2 (two) times daily. 90 g Scot Jun, FNP     PDMP not reviewed this encounter.   Scot Jun, FNP 03/02/21 1330

## 2021-03-02 NOTE — ED Triage Notes (Signed)
Rash in between breast x 1 week ago.  Also reports pain to RT lower back with movement.

## 2021-03-03 LAB — URINE CULTURE: Culture: <10000 — AB

## 2021-03-04 ENCOUNTER — Telehealth: Payer: Self-pay

## 2021-03-04 DIAGNOSIS — Z13220 Encounter for screening for lipoid disorders: Secondary | ICD-10-CM

## 2021-03-04 DIAGNOSIS — E876 Hypokalemia: Secondary | ICD-10-CM

## 2021-03-04 DIAGNOSIS — Z79899 Other long term (current) drug therapy: Secondary | ICD-10-CM

## 2021-03-04 DIAGNOSIS — E119 Type 2 diabetes mellitus without complications: Secondary | ICD-10-CM

## 2021-03-04 DIAGNOSIS — I10 Essential (primary) hypertension: Secondary | ICD-10-CM

## 2021-03-04 NOTE — Telephone Encounter (Signed)
Pt has appt on 06/21 and needs her blood work ordered.  Pt call back [phone]

## 2021-03-04 NOTE — Telephone Encounter (Signed)
Last labs completed 11/12/20 magnesium, cbc, cmp, a1c, hepatic, and lipid. Please advise. Thank you

## 2021-03-06 ENCOUNTER — Telehealth: Payer: Self-pay

## 2021-03-06 NOTE — Telephone Encounter (Signed)
Lab orders placed and pt is aware 

## 2021-03-06 NOTE — Telephone Encounter (Signed)
Re-bolded patient message in provider box.

## 2021-03-06 NOTE — Telephone Encounter (Signed)
Patient calling back about lab work message she left the other day.

## 2021-03-07 DIAGNOSIS — I10 Essential (primary) hypertension: Secondary | ICD-10-CM | POA: Diagnosis not present

## 2021-03-07 DIAGNOSIS — Z79899 Other long term (current) drug therapy: Secondary | ICD-10-CM | POA: Diagnosis not present

## 2021-03-07 DIAGNOSIS — E876 Hypokalemia: Secondary | ICD-10-CM | POA: Diagnosis not present

## 2021-03-07 DIAGNOSIS — Z13220 Encounter for screening for lipoid disorders: Secondary | ICD-10-CM | POA: Diagnosis not present

## 2021-03-07 DIAGNOSIS — E119 Type 2 diabetes mellitus without complications: Secondary | ICD-10-CM | POA: Diagnosis not present

## 2021-03-08 LAB — COMPREHENSIVE METABOLIC PANEL
ALT: 15 IU/L (ref 0–32)
AST: 17 IU/L (ref 0–40)
Albumin/Globulin Ratio: 2 (ref 1.2–2.2)
Albumin: 4.5 g/dL (ref 3.8–4.8)
Alkaline Phosphatase: 90 IU/L (ref 44–121)
BUN/Creatinine Ratio: 12 (ref 12–28)
BUN: 9 mg/dL (ref 8–27)
Bilirubin Total: 0.7 mg/dL (ref 0.0–1.2)
CO2: 24 mmol/L (ref 20–29)
Calcium: 9.4 mg/dL (ref 8.7–10.3)
Chloride: 105 mmol/L (ref 96–106)
Creatinine, Ser: 0.75 mg/dL (ref 0.57–1.00)
Globulin, Total: 2.3 g/dL (ref 1.5–4.5)
Glucose: 94 mg/dL (ref 65–99)
Potassium: 4 mmol/L (ref 3.5–5.2)
Sodium: 144 mmol/L (ref 134–144)
Total Protein: 6.8 g/dL (ref 6.0–8.5)
eGFR: 88 mL/min/{1.73_m2} (ref >59)

## 2021-03-08 LAB — CBC WITH DIFFERENTIAL/PLATELET
Basophils Absolute: 0.1 10*3/uL (ref 0.0–0.2)
Basos: 1 %
EOS (ABSOLUTE): 0.2 10*3/uL (ref 0.0–0.4)
Eos: 3 %
Hematocrit: 42.2 % (ref 34.0–46.6)
Hemoglobin: 14 g/dL (ref 11.1–15.9)
Immature Grans (Abs): 0 10*3/uL (ref 0.0–0.1)
Immature Granulocytes: 0 %
Lymphocytes Absolute: 3.4 10*3/uL — ABNORMAL HIGH (ref 0.7–3.1)
Lymphs: 42 %
MCH: 28.3 pg (ref 26.6–33.0)
MCHC: 33.2 g/dL (ref 31.5–35.7)
MCV: 85 fL (ref 79–97)
Monocytes Absolute: 0.6 10*3/uL (ref 0.1–0.9)
Monocytes: 8 %
Neutrophils Absolute: 3.8 10*3/uL (ref 1.4–7.0)
Neutrophils: 46 %
Platelets: 290 10*3/uL (ref 150–450)
RBC: 4.95 x10E6/uL (ref 3.77–5.28)
RDW: 12.8 % (ref 11.7–15.4)
WBC: 8.1 10*3/uL (ref 3.4–10.8)

## 2021-03-08 LAB — HEMOGLOBIN A1C
Est. average glucose Bld gHb Est-mCnc: 114 mg/dL
Hgb A1c MFr Bld: 5.6 % (ref 4.8–5.6)

## 2021-03-08 LAB — LIPID PANEL
Chol/HDL Ratio: 4.7 ratio — ABNORMAL HIGH (ref 0.0–4.4)
Cholesterol, Total: 177 mg/dL (ref 100–199)
HDL: 38 mg/dL — ABNORMAL LOW (ref >39)
LDL Chol Calc (NIH): 112 mg/dL — ABNORMAL HIGH (ref 0–99)
Triglycerides: 150 mg/dL — ABNORMAL HIGH (ref 0–149)
VLDL Cholesterol Cal: 27 mg/dL (ref 5–40)

## 2021-03-08 LAB — MAGNESIUM: Magnesium: 2.2 mg/dL (ref 1.6–2.3)

## 2021-03-08 LAB — HEPATIC FUNCTION PANEL: Bilirubin, Direct: 0.18 mg/dL (ref 0.00–0.40)

## 2021-03-18 ENCOUNTER — Encounter: Payer: Self-pay | Admitting: Family Medicine

## 2021-03-18 ENCOUNTER — Ambulatory Visit (INDEPENDENT_AMBULATORY_CARE_PROVIDER_SITE_OTHER): Payer: Medicare Other | Admitting: Family Medicine

## 2021-03-18 ENCOUNTER — Other Ambulatory Visit: Payer: Self-pay

## 2021-03-18 VITALS — BP 138/82 | Temp 92.3°F | Wt 188.6 lb

## 2021-03-18 DIAGNOSIS — I10 Essential (primary) hypertension: Secondary | ICD-10-CM

## 2021-03-18 DIAGNOSIS — E119 Type 2 diabetes mellitus without complications: Secondary | ICD-10-CM

## 2021-03-18 DIAGNOSIS — F418 Other specified anxiety disorders: Secondary | ICD-10-CM

## 2021-03-18 DIAGNOSIS — E039 Hypothyroidism, unspecified: Secondary | ICD-10-CM | POA: Diagnosis not present

## 2021-03-18 MED ORDER — POTASSIUM CHLORIDE CRYS ER 20 MEQ PO TBCR: 20.0000 meq | EXTENDED_RELEASE_TABLET | Freq: Every day | ORAL | 1 refills | Status: DC

## 2021-03-18 MED ORDER — AMLODIPINE BESYLATE 10 MG PO TABS
ORAL_TABLET | ORAL | 1 refills | Status: DC
Start: 2021-05-29 — End: 2022-09-10

## 2021-03-18 MED ORDER — INDAPAMIDE 2.5 MG PO TABS: ORAL_TABLET | ORAL | 1 refills | Status: DC

## 2021-03-18 MED ORDER — ALPRAZOLAM 0.5 MG PO TABS: 0.5000 mg | ORAL_TABLET | Freq: Every evening | ORAL | 3 refills | Status: DC | PRN

## 2021-03-18 NOTE — Progress Notes (Signed)
Patient ID: Jessica Kirby, female    DOB: 10/30/1955, 65 y.o.   MRN: [id number]   Chief Complaint  Patient presents with   Diabetes   Subjective:    HPI Pt here for check up on DM. Pt checking sugars twice daily. Pt states sugar was in the 90s yesterday. Pt did have labs completed. Not on meds fo rthis, just diet modifications.  Dm2- Compliant with medications. Checking blood glucose.   Not seeing any high or low numbers.  Denies polyuria or polydipsia.   Pt states when she takes potassium, she had to run to the bathroom to urinate.  Pt states ear have started to become clogged.  Pt has worked on weight loss. Has been going to gym and walking. Pt has also seen nutritionist.  226 lbs in past in 3/22 and now at 188. Cut out the sweets.   Improved a1c 7.1 and now at 5.6  HTN Pt compliant with BP meds.  No SEs Denies chest pain, sob, LE swelling, or blurry vision.   Medical History Jessica Kirby has a past medical history of Allergy, Anxiety, Depression, Diabetes mellitus without complication ([place]) (0/10/5425), GERD (gastroesophageal reflux disease), Herniated disc, Hypertension, Hypothyroidism, Osteoporosis, and Thyroid disease.   Outpatient Encounter Medications as of 03/18/2021  Medication Sig   aspirin EC 81 MG tablet Take 81 mg by mouth daily.    blood glucose meter kit and supplies Dispense based on patient and insurance preference. Use to test glucose twice daily  (FOR ICD-10,  E11.9).   famotidine (PEPCID) 20 MG tablet Take 1 tablet (20 mg total) by mouth as needed for heartburn or indigestion.   fluticasone (FLONASE) 50 MCG/ACT nasal spray SHAKE LIQUID AND USE 2 SPRAYS IN EACH NOSTRIL DAILY   loratadine (CLARITIN) 10 MG tablet Take 10 mg by mouth daily as needed for allergies.   meclizine (ANTIVERT) 25 MG tablet Take 1 tablet (25 mg total) by mouth 3 (three) times daily as needed for dizziness.   Multiple Vitamin (MULTIVITAMIN WITH MINERALS) TABS tablet Take 1 tablet  by mouth daily.   ondansetron (ZOFRAN ODT) 4 MG disintegrating tablet 52m ODT q4 hours prn nausea/vomit   OVER THE COUNTER MEDICATION Tums 1-2 prn   triamcinolone cream (KENALOG) 0.1 % Apply 1 application topically 2 (two) times daily.   [DISCONTINUED] ALPRAZolam (XANAX) 0.5 MG tablet TAKE 1 TABLET(0.5 MG) BY MOUTH AT BEDTIME AS NEEDED FOR ANXIETY   [DISCONTINUED] amLODipine (NORVASC) 10 MG tablet TAKE 1 TABLET(10 MG) BY MOUTH DAILY   [DISCONTINUED] indapamide (LOZOL) 2.5 MG tablet Take one po q am Monday and Fridays.   ALPRAZolam (XANAX) 0.5 MG tablet Take 1 tablet (0.5 mg total) by mouth at bedtime as needed for anxiety.   [START ON 05/29/2021] amLODipine (NORVASC) 10 MG tablet TAKE 1 TABLET(10 MG) BY MOUTH DAILY   indapamide (LOZOL) 2.5 MG tablet Take one po q am Monday and Fridays.   potassium chloride SA (KLOR-CON) 20 MEQ tablet Take 1 tablet (20 mEq total) by mouth daily.   [DISCONTINUED] doxycycline (VIBRAMYCIN) 100 MG capsule Take 1 capsule (100 mg total) by mouth 2 (two) times daily.   [DISCONTINUED] potassium chloride SA (KLOR-CON) 20 MEQ tablet Take 1 tablet (20 mEq total) by mouth once for 1 dose. (Patient taking differently: Take 20 mEq by mouth daily.)   No facility-administered encounter medications on file as of 03/18/2021.     Review of Systems  Constitutional:  Negative for chills and fever.  HENT:  Negative for congestion, rhinorrhea and sore throat.   Respiratory:  Negative for cough, shortness of breath and wheezing.   Cardiovascular:  Negative for chest pain and leg swelling.  Gastrointestinal:  Negative for abdominal pain, diarrhea, nausea and vomiting.  Genitourinary:  Negative for dysuria and frequency.  Musculoskeletal:  Negative for arthralgias and back pain.  Skin:  Negative for rash.  Neurological:  Negative for dizziness, weakness and headaches.    Vitals BP 138/82   Temp (!) 92.3 F (33.5 C)   Wt 188 lb 9.6 oz (85.5 kg)   BMI 34.50 kg/m   Objective:    Physical Exam Vitals and nursing note reviewed.  Constitutional:      Appearance: Normal appearance.  HENT:     Head: Normocephalic and atraumatic.     Nose: Nose normal.     Mouth/Throat:     Mouth: Mucous membranes are moist.     Pharynx: Oropharynx is clear.  Eyes:     Extraocular Movements: Extraocular movements intact.     Conjunctiva/sclera: Conjunctivae normal.     Pupils: Pupils are equal, round, and reactive to light.  Cardiovascular:     Rate and Rhythm: Normal rate and regular rhythm.     Pulses: Normal pulses.     Heart sounds: Normal heart sounds.  Pulmonary:     Effort: Pulmonary effort is normal.     Breath sounds: Normal breath sounds. No wheezing, rhonchi or rales.  Musculoskeletal:        General: Normal range of motion.     Right lower leg: No edema.     Left lower leg: No edema.  Skin:    General: Skin is warm and dry.     Findings: No lesion or rash.  Neurological:     General: No focal deficit present.     Mental Status: She is alert and oriented to person, place, and time.  Psychiatric:        Mood and Affect: Mood normal.        Behavior: Behavior normal.     Assessment and Plan   1. Diabetes mellitus without complication ([place])  2. Primary hypertension - potassium chloride SA (KLOR-CON) 20 MEQ tablet; Take 1 tablet (20 mEq total) by mouth daily.  Dispense: 90 tablet; Refill: 1 - amLODipine (NORVASC) 10 MG tablet; TAKE 1 TABLET(10 MG) BY MOUTH DAILY  Dispense: 90 tablet; Refill: 1 - indapamide (LOZOL) 2.5 MG tablet; Take one po q am Monday and Fridays.  Dispense: 30 tablet; Refill: 1  3. Hypothyroidism, unspecified type  4. Depression with anxiety - ALPRAZolam (XANAX) 0.5 MG tablet; Take 1 tablet (0.5 mg total) by mouth at bedtime as needed for anxiety.  Dispense: 30 tablet; Refill: 3   DM2- improved, stable.  Pt is making great work on weight loss and diet changes. Cont meds.  HLD--stable. Labs reviewed.  Not on meds.  Improved total  cholesterol and LDL.  Hypothyroid- stable. Cont meds.  Htn- stable. Cont meds.  Cont with weight loss and dec salt.  Anxiety- stable.  Refilled medication,  pt taking prn xanax and reviewed taking this medication for long term and the risk vs. Benefits.  Pt voiced understanding.  Return in about 6 months (around 09/17/2021) for f/u htn, hypokalemia, prediab.

## 2021-04-01 ENCOUNTER — Ambulatory Visit (INDEPENDENT_AMBULATORY_CARE_PROVIDER_SITE_OTHER): Payer: Medicare Other | Admitting: Psychologist

## 2021-04-01 DIAGNOSIS — F320 Major depressive disorder, single episode, mild: Secondary | ICD-10-CM | POA: Diagnosis not present

## 2021-04-02 DIAGNOSIS — I10 Essential (primary) hypertension: Secondary | ICD-10-CM | POA: Diagnosis not present

## 2021-04-02 DIAGNOSIS — R600 Localized edema: Secondary | ICD-10-CM | POA: Diagnosis not present

## 2021-04-02 DIAGNOSIS — K219 Gastro-esophageal reflux disease without esophagitis: Secondary | ICD-10-CM | POA: Diagnosis not present

## 2021-04-02 DIAGNOSIS — E876 Hypokalemia: Secondary | ICD-10-CM | POA: Diagnosis not present

## 2021-04-02 DIAGNOSIS — R7303 Prediabetes: Secondary | ICD-10-CM | POA: Diagnosis not present

## 2021-04-02 DIAGNOSIS — J302 Other seasonal allergic rhinitis: Secondary | ICD-10-CM | POA: Diagnosis not present

## 2021-04-02 DIAGNOSIS — E782 Mixed hyperlipidemia: Secondary | ICD-10-CM | POA: Diagnosis not present

## 2021-04-02 DIAGNOSIS — E039 Hypothyroidism, unspecified: Secondary | ICD-10-CM | POA: Diagnosis not present

## 2021-04-02 DIAGNOSIS — Z0189 Encounter for other specified special examinations: Secondary | ICD-10-CM | POA: Diagnosis not present

## 2021-04-04 ENCOUNTER — Encounter: Payer: Medicare Other | Attending: Family Medicine | Admitting: Dietician

## 2021-04-04 ENCOUNTER — Encounter: Payer: Self-pay | Admitting: Dietician

## 2021-04-04 DIAGNOSIS — E119 Type 2 diabetes mellitus without complications: Secondary | ICD-10-CM | POA: Diagnosis not present

## 2021-04-04 NOTE — Patient Instructions (Addendum)
Consider taking a melatonin supplement an hour or two before bed to aid in restful sleep.  Check your blood sugar once a week, look for your numbers to stay between 70-100. If you start seeing your numbers staying higher, begin checking more frequently   Look for "Low Sodium" "Reduced Sodium" or "No Salt Added" canned vegetables.  Consider having whole wheat bread instead of white bread.  CONGRATULATIONS ON ALL OF YOUR SUCCESS! KEEP UP THE GREAT WORK!

## 2021-04-04 NOTE — Progress Notes (Signed)
Medical Nutrition Therapy  Appointment Start time:  0800  Appointment End time:  0845  Primary concerns today: Elevated blood sugar Referral diagnosis: E66.01 Class 3 severe obesity Preferred learning style: No preference indicated Learning readiness: Change in progress   NUTRITION ASSESSMENT   Anthropometrics  Ht: 5'2" Wt:  185 lbs Wt Change: -30 lbs since 11/17/20 Body mass index is 33.84 kg/m.   Clinical Medical Hx: Osteoporosis, HTN, GERD Medications: Amlodopine, famotidine, Potassium Chloride Labs: 03/07/2021 - All dramatically improved A1c - 5.6, Glucose - 94, TC - 177, LDL - 112 Notable Signs/Symptoms: Persistent reflux, low energy   Lifestyle & Dietary Hx Pt A1c is down to 5.6 from 7.1.  Pt reports trying to watch their carbs and eating less sweets and junkfood.  Pt started going to the gym in the past two weeks, tries to go daily. Pt is walking the treadmill and doing some resistance training.  Pt reports losing a long time pet recently which has interrupted their sleep and elevated their BG. Pt is still checking BG twice a day, but will be moving to checking once a week now. FBG - 90-95 CBG - 100-130 Pt is drinking 5 bottles of water a day, rarely diet sodas. Pt reports having to switch PCP due to previous provider leaving the practice, will continue DM care through them. Pt has bloodwork scheduled in 3 months.   Estimated daily fluid intake: 128 oz Supplements: Daily MV Sleep: Up a lot, anxious and frequent urination Stress / self-care: 9/10, loss of husband of 48 years. Current average weekly physical activity: ADLs, walks for 3 minutes each morning, NEW: Joined gym, doing cardio and resistance exercise   24-Hr Dietary Recall First Meal: Honey nut cheerios w/ low-fat milk 1 banana, coffee w/low-fat milk Snack:  Second Meal: Broccoli w/ cheese, baked potato, baked chicken breast, small amount of peas Snack: Apple Third Meal: Ham sandwich, white  bread Snack: none Beverages: coffee, water, diet coke    NUTRITION DIAGNOSIS  NB-1.1 Food and nutrition-related knowledge deficit As related to GERD.  As evidenced by self reported persistent reflux, sedentary lifestlye, irregular meal pattern, and intake of high fat/fried foods.    NUTRITION INTERVENTION  Nutrition education (E-1) on the following topics:  GERD Educate pt on foods that contribute to GERD symptoms. This includes high fat and fried foods, caffeine, spicy foods, and sodas. Educate pt on the importance of consistent meal times and physical activity. Educate pt on the role that stress plays on increasing reflux, and the importance of working on stress management. Advise pt to refrain from eating within 2-3 hours of laying down to sleep. Advise pt to sleep on their back with their head elevated to reduce reflux.  Diabetes Educated patient on factors that contribute to elevation of blood sugars, such as stress, illness, injury,and food choices. Discussed the role that physical activity plays in lowering blood sugar. Discussed the importance of eating a consistent amount of carbohydrate throughout the day.  Cholesterol Educate pt on factors that can elevate LDL cholesterol, including high dietary intake of saturated fats. Educate pt on identifying sources of saturated fats, and how to make alternative food choices to lower saturated fat intake.Educate pt on the role of physical activity in lowering cholesterol.  Handouts Provided Include  Balanced Plate Yellow meal plan card Soluble and Insoluble Fiber Food List High Potassium Food List  Learning Style & Readiness for Change Teaching method utilized: Visual & Auditory  Demonstrated degree of understanding via: Teach Back  Barriers to learning/adherence to lifestyle change: none  Goals Established by Pt Consider taking a melatonin supplement an hour or two before bed to aid in restful sleep. Check your blood sugar once a  week, look for your numbers to stay between 70-100. If you start seeing your numbers staying higher, begin checking more frequently  Look for "Low Sodium" "Reduced Sodium" or "No Salt Added" canned vegetables. Consider having whole wheat bread instead of white bread. CONGRATULATIONS ON ALL OF YOUR SUCCESS! KEEP UP THE GREAT WORK!   MONITORING & EVALUATION Dietary intake, weekly physical activity, A1c, blood lipids as needed.  Next Steps  Patient is to follow up with dietitian PRN.

## 2021-04-10 DIAGNOSIS — R7303 Prediabetes: Secondary | ICD-10-CM | POA: Diagnosis not present

## 2021-04-10 DIAGNOSIS — E782 Mixed hyperlipidemia: Secondary | ICD-10-CM | POA: Diagnosis not present

## 2021-04-16 DIAGNOSIS — I10 Essential (primary) hypertension: Secondary | ICD-10-CM | POA: Diagnosis not present

## 2021-04-16 DIAGNOSIS — J302 Other seasonal allergic rhinitis: Secondary | ICD-10-CM | POA: Diagnosis not present

## 2021-04-16 DIAGNOSIS — E782 Mixed hyperlipidemia: Secondary | ICD-10-CM | POA: Diagnosis not present

## 2021-04-16 DIAGNOSIS — E876 Hypokalemia: Secondary | ICD-10-CM | POA: Diagnosis not present

## 2021-04-16 DIAGNOSIS — K219 Gastro-esophageal reflux disease without esophagitis: Secondary | ICD-10-CM | POA: Diagnosis not present

## 2021-04-16 DIAGNOSIS — N390 Urinary tract infection, site not specified: Secondary | ICD-10-CM | POA: Diagnosis not present

## 2021-04-16 DIAGNOSIS — E039 Hypothyroidism, unspecified: Secondary | ICD-10-CM | POA: Diagnosis not present

## 2021-04-16 DIAGNOSIS — R7303 Prediabetes: Secondary | ICD-10-CM | POA: Diagnosis not present

## 2021-04-16 DIAGNOSIS — R600 Localized edema: Secondary | ICD-10-CM | POA: Diagnosis not present

## 2021-05-14 ENCOUNTER — Other Ambulatory Visit: Payer: Self-pay | Admitting: Family Medicine

## 2021-05-14 DIAGNOSIS — I10 Essential (primary) hypertension: Secondary | ICD-10-CM

## 2021-05-29 DIAGNOSIS — E876 Hypokalemia: Secondary | ICD-10-CM | POA: Diagnosis not present

## 2021-06-04 DIAGNOSIS — M79674 Pain in right toe(s): Secondary | ICD-10-CM | POA: Diagnosis not present

## 2021-06-04 DIAGNOSIS — M79675 Pain in left toe(s): Secondary | ICD-10-CM | POA: Diagnosis not present

## 2021-06-04 DIAGNOSIS — M79672 Pain in left foot: Secondary | ICD-10-CM | POA: Diagnosis not present

## 2021-06-04 DIAGNOSIS — M79671 Pain in right foot: Secondary | ICD-10-CM | POA: Diagnosis not present

## 2021-06-04 DIAGNOSIS — E1140 Type 2 diabetes mellitus with diabetic neuropathy, unspecified: Secondary | ICD-10-CM | POA: Diagnosis not present

## 2021-06-11 ENCOUNTER — Other Ambulatory Visit: Payer: Self-pay | Admitting: Family Medicine

## 2021-06-18 DIAGNOSIS — Z23 Encounter for immunization: Secondary | ICD-10-CM | POA: Diagnosis not present

## 2021-07-15 ENCOUNTER — Other Ambulatory Visit (HOSPITAL_COMMUNITY): Payer: Self-pay | Admitting: Internal Medicine

## 2021-07-15 DIAGNOSIS — Z1231 Encounter for screening mammogram for malignant neoplasm of breast: Secondary | ICD-10-CM

## 2021-07-17 DIAGNOSIS — J309 Allergic rhinitis, unspecified: Secondary | ICD-10-CM | POA: Diagnosis not present

## 2021-08-04 ENCOUNTER — Ambulatory Visit (HOSPITAL_COMMUNITY)
Admission: RE | Admit: 2021-08-04 | Discharge: 2021-08-04 | Disposition: A | Discharge status: Home / Self Care | Payer: Medicare Other | Source: Ambulatory Visit | Attending: Internal Medicine | Admitting: Internal Medicine

## 2021-08-04 ENCOUNTER — Other Ambulatory Visit: Payer: Self-pay

## 2021-08-04 DIAGNOSIS — Z1231 Encounter for screening mammogram for malignant neoplasm of breast: Secondary | ICD-10-CM | POA: Diagnosis not present

## 2021-09-03 DIAGNOSIS — M79671 Pain in right foot: Secondary | ICD-10-CM | POA: Diagnosis not present

## 2021-09-03 DIAGNOSIS — M79675 Pain in left toe(s): Secondary | ICD-10-CM | POA: Diagnosis not present

## 2021-09-03 DIAGNOSIS — B351 Tinea unguium: Secondary | ICD-10-CM | POA: Diagnosis not present

## 2021-09-03 DIAGNOSIS — M79674 Pain in right toe(s): Secondary | ICD-10-CM | POA: Diagnosis not present

## 2021-09-03 DIAGNOSIS — M79672 Pain in left foot: Secondary | ICD-10-CM | POA: Diagnosis not present

## 2021-09-10 DIAGNOSIS — T161XXA Foreign body in right ear, initial encounter: Secondary | ICD-10-CM | POA: Diagnosis not present

## 2021-09-10 DIAGNOSIS — H6691 Otitis media, unspecified, right ear: Secondary | ICD-10-CM | POA: Diagnosis not present

## 2021-09-10 DIAGNOSIS — H9201 Otalgia, right ear: Secondary | ICD-10-CM | POA: Diagnosis not present

## 2021-09-18 DIAGNOSIS — R7303 Prediabetes: Secondary | ICD-10-CM | POA: Diagnosis not present

## 2021-09-20 ENCOUNTER — Other Ambulatory Visit: Payer: Self-pay | Admitting: Family Medicine

## 2021-09-24 DIAGNOSIS — M546 Pain in thoracic spine: Secondary | ICD-10-CM | POA: Diagnosis not present

## 2021-09-24 DIAGNOSIS — M542 Cervicalgia: Secondary | ICD-10-CM | POA: Diagnosis not present

## 2021-09-24 DIAGNOSIS — M9901 Segmental and somatic dysfunction of cervical region: Secondary | ICD-10-CM | POA: Diagnosis not present

## 2021-09-24 DIAGNOSIS — M9902 Segmental and somatic dysfunction of thoracic region: Secondary | ICD-10-CM | POA: Diagnosis not present

## 2021-09-25 ENCOUNTER — Other Ambulatory Visit (HOSPITAL_COMMUNITY): Payer: Self-pay | Admitting: Family Medicine

## 2021-09-25 DIAGNOSIS — R600 Localized edema: Secondary | ICD-10-CM | POA: Diagnosis not present

## 2021-09-25 DIAGNOSIS — Z0001 Encounter for general adult medical examination with abnormal findings: Secondary | ICD-10-CM | POA: Diagnosis not present

## 2021-09-25 DIAGNOSIS — E039 Hypothyroidism, unspecified: Secondary | ICD-10-CM | POA: Diagnosis not present

## 2021-09-25 DIAGNOSIS — J302 Other seasonal allergic rhinitis: Secondary | ICD-10-CM | POA: Diagnosis not present

## 2021-09-25 DIAGNOSIS — K219 Gastro-esophageal reflux disease without esophagitis: Secondary | ICD-10-CM | POA: Diagnosis not present

## 2021-09-25 DIAGNOSIS — I10 Essential (primary) hypertension: Secondary | ICD-10-CM | POA: Diagnosis not present

## 2021-09-25 DIAGNOSIS — Z13820 Encounter for screening for osteoporosis: Secondary | ICD-10-CM | POA: Diagnosis not present

## 2021-09-30 DIAGNOSIS — M9902 Segmental and somatic dysfunction of thoracic region: Secondary | ICD-10-CM | POA: Diagnosis not present

## 2021-09-30 DIAGNOSIS — M546 Pain in thoracic spine: Secondary | ICD-10-CM | POA: Diagnosis not present

## 2021-09-30 DIAGNOSIS — M9901 Segmental and somatic dysfunction of cervical region: Secondary | ICD-10-CM | POA: Diagnosis not present

## 2021-09-30 DIAGNOSIS — M542 Cervicalgia: Secondary | ICD-10-CM | POA: Diagnosis not present

## 2021-10-02 DIAGNOSIS — H9201 Otalgia, right ear: Secondary | ICD-10-CM | POA: Diagnosis not present

## 2021-10-02 DIAGNOSIS — R519 Headache, unspecified: Secondary | ICD-10-CM | POA: Diagnosis not present

## 2021-10-02 DIAGNOSIS — R7303 Prediabetes: Secondary | ICD-10-CM | POA: Diagnosis not present

## 2021-10-02 DIAGNOSIS — J019 Acute sinusitis, unspecified: Secondary | ICD-10-CM | POA: Diagnosis not present

## 2021-10-03 DIAGNOSIS — M542 Cervicalgia: Secondary | ICD-10-CM | POA: Diagnosis not present

## 2021-10-03 DIAGNOSIS — M546 Pain in thoracic spine: Secondary | ICD-10-CM | POA: Diagnosis not present

## 2021-10-03 DIAGNOSIS — M9901 Segmental and somatic dysfunction of cervical region: Secondary | ICD-10-CM | POA: Diagnosis not present

## 2021-10-03 DIAGNOSIS — M9902 Segmental and somatic dysfunction of thoracic region: Secondary | ICD-10-CM | POA: Diagnosis not present

## 2021-10-06 DIAGNOSIS — M9901 Segmental and somatic dysfunction of cervical region: Secondary | ICD-10-CM | POA: Diagnosis not present

## 2021-10-06 DIAGNOSIS — M546 Pain in thoracic spine: Secondary | ICD-10-CM | POA: Diagnosis not present

## 2021-10-06 DIAGNOSIS — M9902 Segmental and somatic dysfunction of thoracic region: Secondary | ICD-10-CM | POA: Diagnosis not present

## 2021-10-06 DIAGNOSIS — M542 Cervicalgia: Secondary | ICD-10-CM | POA: Diagnosis not present

## 2021-10-09 ENCOUNTER — Other Ambulatory Visit: Payer: Self-pay

## 2021-10-09 ENCOUNTER — Ambulatory Visit (INDEPENDENT_AMBULATORY_CARE_PROVIDER_SITE_OTHER): Payer: Medicare Other | Admitting: Gastroenterology

## 2021-10-09 ENCOUNTER — Encounter (INDEPENDENT_AMBULATORY_CARE_PROVIDER_SITE_OTHER): Payer: Self-pay | Admitting: Gastroenterology

## 2021-10-09 VITALS — BP 138/85 | HR 78 | Temp 98.2°F | Ht 63.0 in | Wt 181.7 lb

## 2021-10-09 DIAGNOSIS — K5904 Chronic idiopathic constipation: Secondary | ICD-10-CM

## 2021-10-09 DIAGNOSIS — K219 Gastro-esophageal reflux disease without esophagitis: Secondary | ICD-10-CM | POA: Diagnosis not present

## 2021-10-09 MED ORDER — PANTOPRAZOLE SODIUM 20 MG PO TBEC: 20.0000 mg | DELAYED_RELEASE_TABLET | Freq: Every day | ORAL | 3 refills | Status: DC

## 2021-10-09 NOTE — Patient Instructions (Signed)
Start pantoprazole 20 mg every day Explained presumed etiology of reflux symptoms. Instruction provided in the use of antireflux medication - patient should take medication in the morning 30-45 minutes before eating breakfast. Discussed avoidance of eating within 2 hours of lying down to sleep and benefit of blocks to elevate head of bed. Also, will benefit from avoiding carbonated drinks/sodas or food that has tomatoes, spicy or greasy food. Stop famotidine Increase Miralax 1 capful every day

## 2021-10-09 NOTE — Progress Notes (Signed)
Maylon Peppers, M.D. Gastroenterology & Hepatology [facility] [address] [place], [place] [address]  Primary Care Physician: Celene Squibb, MD [place] [id number]  I will communicate my assessment and recommendations to the referring MD via EMR.  Problems: GERD Constipation  History of Present Illness: Jessica Kirby is a 66 y.o. female with PMH anxiety, hypertension, hypothyroidism, depression, diabetes, who presents for follow up of heartburn GERD and due to constipation.  The patient was last seen on 10/03/2020. At that time, the patient was advised to take Pepcid as needed for heartburn as she had very rare symptoms after she change her lifestyle habits.  She was also advised to take MiraLAX for constipation.  Patient reports that she is presenting occasional "taste regurgitation of the pills" she takes at home. She states that when she takes her pills with apple sauce, this does not happen. She reports she is having these symptoms 1-2 times a week. Has very mild heartburn when this happens. She takes famotidine when she has these episodes which helps. Denies any  odynophagia.  Has been taking Miralax 1-2 times a week. Has intermittent constipation - has a bowel movement 3-4 times a week. Thinks that bloating and gas sensation is related to constipation.  Has been working on losing weight, has lost 47 lb since the last time she was seen in the office a year ago. She has been changing her diet on purpose.   The patient denies having any nausea, vomiting, fever, chills, hematochezia, melena, hematemesis, abdominal distention, abdominal pain, diarrhea, jaundice, pruritus or weight loss.  Last Colonoscopy: 2017 - Diverticulosis in the sigmoid colon and in the descending colon.  Past Medical History: Past Medical History:  Diagnosis Date   Allergy    Anxiety    Insomnia   Depression    Insomnia   Diabetes  mellitus without complication ([place]) 05/31/2354   GERD (gastroesophageal reflux disease)    Herniated disc    Hypertension    Hypothyroidism    not medicated at this time   Osteoporosis    Thyroid disease    Hypothyroidism    Past Surgical History: Past Surgical History:  Procedure Laterality Date   ABDOMINAL HYSTERECTOMY     COLONOSCOPY N/A 04/07/2016   Procedure: COLONOSCOPY;  Surgeon: Daneil Dolin, MD;  Location: [facility];  Service: Endoscopy;  Laterality: N/A;  8:30 AM   TUBAL LIGATION     WRIST SURGERY      Family History: Family History  Problem Relation Age of Onset   Diabetes Father    Heart attack Mother    Heart disease Mother    Bladder Cancer Other    Colon cancer Neg Hx     Social History: Social History   Tobacco Use  Smoking Status Never   Passive exposure: Yes  Smokeless Tobacco Never   Social History   Substance and Sexual Activity  Alcohol Use No   Alcohol/week: 0.0 standard drinks   Social History   Substance and Sexual Activity  Drug Use No    Allergies: Allergies  Allergen Reactions   Fosamax [Alendronate Sodium] Other (See Comments)    GI Headache   Vioxx [Rofecoxib]     Per patient   Losartan Cough   Sulfa Antibiotics Hives    Medications: Current Outpatient Medications  Medication Sig Dispense Refill   ALPRAZolam (XANAX) 0.5 MG tablet Take 1 tablet (0.5 mg total) by mouth  at bedtime as needed for anxiety. 30 tablet 3   amLODipine (NORVASC) 10 MG tablet TAKE 1 TABLET(10 MG) BY MOUTH DAILY 90 tablet 1   aspirin EC 81 MG tablet Take 81 mg by mouth daily.      blood glucose meter kit and supplies Dispense based on patient and insurance preference. Use to test glucose twice daily  (FOR ICD-10,  E11.9). 1 each 5   calcium citrate (CALCITRATE - DOSED IN MG ELEMENTAL CALCIUM) 950 (200 Ca) MG tablet Take 200 mg of elemental calcium by mouth daily.     cefdinir (OMNICEF) 300 MG capsule Take 300 mg by mouth 2 (two) times daily.  For seven days. (Will finish 10/09/2021).     famotidine (PEPCID) 20 MG tablet Take 1 tablet (20 mg total) by mouth as needed for heartburn or indigestion. 90 tablet 1   indapamide (LOZOL) 2.5 MG tablet TAKE ONE BY MOUTH EVERY AM MONDAYS AND FRIDAYS 30 tablet 1   levocetirizine (XYZAL) 5 MG tablet Take 5 mg by mouth every evening.     Multiple Vitamin (MULTIVITAMIN WITH MINERALS) TABS tablet Take 1 tablet by mouth daily.     triamcinolone (NASACORT) 55 MCG/ACT AERO nasal inhaler Place 2 sprays into the nose daily. As needed per patient.     No current facility-administered medications for this visit.    Review of Systems: GENERAL: negative for malaise, night sweats HEENT: No changes in hearing or vision, no nose bleeds or other nasal problems. NECK: Negative for lumps, goiter, pain and significant neck swelling RESPIRATORY: Negative for cough, wheezing CARDIOVASCULAR: Negative for chest pain, leg swelling, palpitations, orthopnea GI: SEE HPI MUSCULOSKELETAL: Negative for joint pain or swelling, back pain, and muscle pain. SKIN: Negative for lesions, rash PSYCH: Negative for sleep disturbance, mood disorder and recent psychosocial stressors. HEMATOLOGY Negative for prolonged bleeding, bruising easily, and swollen nodes. ENDOCRINE: Negative for cold or heat intolerance, polyuria, polydipsia and goiter. NEURO: negative for tremor, gait imbalance, syncope and seizures. The remainder of the review of systems is noncontributory.   Physical Exam: BP 138/85 (BP Location: Left Arm, Patient Position: Sitting, Cuff Size: Large)    Pulse 78    Temp 98.2 F (36.8 C) (Oral)    Ht '5\' 3"'  (1.6 m)    Wt 181 lb 11.2 oz (82.4 kg)    BMI 32.19 kg/m  GENERAL: The patient is AO x3, in no acute distress. HEENT: Head is normocephalic and atraumatic. EOMI are intact. Mouth is well hydrated and without lesions. NECK: Supple. No masses LUNGS: Clear to auscultation. No presence of rhonchi/wheezing/rales.  Adequate chest expansion HEART: RRR, normal s1 and s2. ABDOMEN: Soft, nontender, no guarding, no peritoneal signs, and nondistended. BS +. No masses. EXTREMITIES: Without any cyanosis, clubbing, rash, lesions or edema. NEUROLOGIC: AOx3, no focal motor deficit. SKIN: no jaundice, no rashes  Imaging/Labs: as above  I personally reviewed and interpreted the available labs, imaging and endoscopic files.  Impression and Plan: Jessica Kirby is a 66 y.o. female with PMH anxiety, hypertension, hypothyroidism, depression, diabetes, who presents for follow up of heartburn GERD and due to constipation.  The patient has presented recurrent episodes of possible regurgitation that have been treated as needed with famotidine.  Even though at this has led to symptom improvement, she is presenting frequent symptoms and ideally she should be taking medication to suppress her acid production and improve her regurgitation as much as possible.  Due to this, I discussed with her the possibility of starting  pantoprazole 20 mg every day which she agreed to.  She will stop famotidine for now.  Also, she would benefit from taking MiraLAX on a frequent basis to improve her bowel movement frequency.  - Start pantoprazole 20 mg every day - Explained presumed etiology of reflux symptoms. Instruction provided in the use of antireflux medication - patient should take medication in the morning 30-45 minutes before eating breakfast. Discussed avoidance of eating within 2 hours of lying down to sleep and benefit of blocks to elevate head of bed. Also, will benefit from avoiding carbonated drinks/sodas or food that has tomatoes, spicy or greasy food. - Stop famotidine - Increase Miralax 1 capful every day  All questions were answered.      Harvel Quale, MD Gastroenterology and Hepatology [facility]

## 2021-10-10 DIAGNOSIS — M542 Cervicalgia: Secondary | ICD-10-CM | POA: Diagnosis not present

## 2021-10-10 DIAGNOSIS — M9902 Segmental and somatic dysfunction of thoracic region: Secondary | ICD-10-CM | POA: Diagnosis not present

## 2021-10-10 DIAGNOSIS — M9901 Segmental and somatic dysfunction of cervical region: Secondary | ICD-10-CM | POA: Diagnosis not present

## 2021-10-10 DIAGNOSIS — M546 Pain in thoracic spine: Secondary | ICD-10-CM | POA: Diagnosis not present

## 2021-10-17 DIAGNOSIS — M9902 Segmental and somatic dysfunction of thoracic region: Secondary | ICD-10-CM | POA: Diagnosis not present

## 2021-10-17 DIAGNOSIS — M542 Cervicalgia: Secondary | ICD-10-CM | POA: Diagnosis not present

## 2021-10-17 DIAGNOSIS — M546 Pain in thoracic spine: Secondary | ICD-10-CM | POA: Diagnosis not present

## 2021-10-17 DIAGNOSIS — M9901 Segmental and somatic dysfunction of cervical region: Secondary | ICD-10-CM | POA: Diagnosis not present

## 2021-10-24 ENCOUNTER — Ambulatory Visit (HOSPITAL_COMMUNITY)
Admission: RE | Admit: 2021-10-24 | Discharge: 2021-10-24 | Disposition: A | Discharge status: Home / Self Care | Payer: Medicare Other | Source: Ambulatory Visit | Attending: Family Medicine | Admitting: Family Medicine

## 2021-10-24 ENCOUNTER — Other Ambulatory Visit: Payer: Self-pay

## 2021-10-24 DIAGNOSIS — M8588 Other specified disorders of bone density and structure, other site: Secondary | ICD-10-CM | POA: Insufficient documentation

## 2021-10-24 DIAGNOSIS — Z13820 Encounter for screening for osteoporosis: Secondary | ICD-10-CM | POA: Insufficient documentation

## 2021-10-24 DIAGNOSIS — Z780 Asymptomatic menopausal state: Secondary | ICD-10-CM | POA: Insufficient documentation

## 2021-10-24 DIAGNOSIS — M9902 Segmental and somatic dysfunction of thoracic region: Secondary | ICD-10-CM | POA: Diagnosis not present

## 2021-10-24 DIAGNOSIS — M8589 Other specified disorders of bone density and structure, multiple sites: Secondary | ICD-10-CM | POA: Diagnosis not present

## 2021-10-24 DIAGNOSIS — M9901 Segmental and somatic dysfunction of cervical region: Secondary | ICD-10-CM | POA: Diagnosis not present

## 2021-10-24 DIAGNOSIS — M546 Pain in thoracic spine: Secondary | ICD-10-CM | POA: Diagnosis not present

## 2021-10-24 DIAGNOSIS — M542 Cervicalgia: Secondary | ICD-10-CM | POA: Diagnosis not present

## 2021-11-07 DIAGNOSIS — M9901 Segmental and somatic dysfunction of cervical region: Secondary | ICD-10-CM | POA: Diagnosis not present

## 2021-11-07 DIAGNOSIS — M9902 Segmental and somatic dysfunction of thoracic region: Secondary | ICD-10-CM | POA: Diagnosis not present

## 2021-11-07 DIAGNOSIS — M546 Pain in thoracic spine: Secondary | ICD-10-CM | POA: Diagnosis not present

## 2021-11-07 DIAGNOSIS — M542 Cervicalgia: Secondary | ICD-10-CM | POA: Diagnosis not present

## 2021-11-17 DIAGNOSIS — R5383 Other fatigue: Secondary | ICD-10-CM | POA: Diagnosis not present

## 2021-11-21 DIAGNOSIS — L110 Acquired keratosis follicularis: Secondary | ICD-10-CM | POA: Diagnosis not present

## 2021-11-21 DIAGNOSIS — M79671 Pain in right foot: Secondary | ICD-10-CM | POA: Diagnosis not present

## 2021-11-21 DIAGNOSIS — M79674 Pain in right toe(s): Secondary | ICD-10-CM | POA: Diagnosis not present

## 2021-11-21 DIAGNOSIS — M79672 Pain in left foot: Secondary | ICD-10-CM | POA: Diagnosis not present

## 2021-11-21 DIAGNOSIS — E1140 Type 2 diabetes mellitus with diabetic neuropathy, unspecified: Secondary | ICD-10-CM | POA: Diagnosis not present

## 2021-11-21 DIAGNOSIS — M79675 Pain in left toe(s): Secondary | ICD-10-CM | POA: Diagnosis not present

## 2021-12-01 ENCOUNTER — Other Ambulatory Visit: Payer: Self-pay | Admitting: Family Medicine

## 2021-12-01 DIAGNOSIS — I10 Essential (primary) hypertension: Secondary | ICD-10-CM

## 2021-12-04 DIAGNOSIS — M546 Pain in thoracic spine: Secondary | ICD-10-CM | POA: Diagnosis not present

## 2021-12-04 DIAGNOSIS — M542 Cervicalgia: Secondary | ICD-10-CM | POA: Diagnosis not present

## 2021-12-04 DIAGNOSIS — M9901 Segmental and somatic dysfunction of cervical region: Secondary | ICD-10-CM | POA: Diagnosis not present

## 2021-12-04 DIAGNOSIS — M9902 Segmental and somatic dysfunction of thoracic region: Secondary | ICD-10-CM | POA: Diagnosis not present

## 2021-12-22 DIAGNOSIS — N390 Urinary tract infection, site not specified: Secondary | ICD-10-CM | POA: Diagnosis not present

## 2021-12-22 DIAGNOSIS — I10 Essential (primary) hypertension: Secondary | ICD-10-CM | POA: Diagnosis not present

## 2021-12-22 DIAGNOSIS — N3281 Overactive bladder: Secondary | ICD-10-CM | POA: Diagnosis not present

## 2022-01-12 DIAGNOSIS — I10 Essential (primary) hypertension: Secondary | ICD-10-CM | POA: Diagnosis not present

## 2022-01-12 DIAGNOSIS — G4700 Insomnia, unspecified: Secondary | ICD-10-CM | POA: Diagnosis not present

## 2022-01-12 DIAGNOSIS — N3281 Overactive bladder: Secondary | ICD-10-CM | POA: Diagnosis not present

## 2022-01-16 DIAGNOSIS — M9902 Segmental and somatic dysfunction of thoracic region: Secondary | ICD-10-CM | POA: Diagnosis not present

## 2022-01-16 DIAGNOSIS — M546 Pain in thoracic spine: Secondary | ICD-10-CM | POA: Diagnosis not present

## 2022-01-16 DIAGNOSIS — M9901 Segmental and somatic dysfunction of cervical region: Secondary | ICD-10-CM | POA: Diagnosis not present

## 2022-01-16 DIAGNOSIS — M542 Cervicalgia: Secondary | ICD-10-CM | POA: Diagnosis not present

## 2022-01-31 DIAGNOSIS — R5383 Other fatigue: Secondary | ICD-10-CM | POA: Diagnosis not present

## 2022-01-31 DIAGNOSIS — R011 Cardiac murmur, unspecified: Secondary | ICD-10-CM | POA: Diagnosis not present

## 2022-02-02 DIAGNOSIS — R5383 Other fatigue: Secondary | ICD-10-CM | POA: Diagnosis not present

## 2022-02-02 DIAGNOSIS — E559 Vitamin D deficiency, unspecified: Secondary | ICD-10-CM | POA: Diagnosis not present

## 2022-02-03 ENCOUNTER — Other Ambulatory Visit: Payer: Self-pay

## 2022-02-03 ENCOUNTER — Emergency Department (HOSPITAL_COMMUNITY): Payer: Medicare Other

## 2022-02-03 ENCOUNTER — Other Ambulatory Visit (HOSPITAL_COMMUNITY): Payer: Self-pay | Admitting: Family Medicine

## 2022-02-03 ENCOUNTER — Encounter (HOSPITAL_COMMUNITY): Payer: Self-pay | Admitting: Emergency Medicine

## 2022-02-03 ENCOUNTER — Emergency Department (HOSPITAL_COMMUNITY)
Admission: EM | Admit: 2022-02-03 | Discharge: 2022-02-03 | Disposition: A | Discharge status: Home / Self Care | Payer: Medicare Other | Attending: Emergency Medicine | Admitting: Emergency Medicine

## 2022-02-03 DIAGNOSIS — Z7982 Long term (current) use of aspirin: Secondary | ICD-10-CM | POA: Insufficient documentation

## 2022-02-03 DIAGNOSIS — R0789 Other chest pain: Secondary | ICD-10-CM | POA: Diagnosis not present

## 2022-02-03 DIAGNOSIS — Z79899 Other long term (current) drug therapy: Secondary | ICD-10-CM | POA: Insufficient documentation

## 2022-02-03 DIAGNOSIS — E039 Hypothyroidism, unspecified: Secondary | ICD-10-CM | POA: Insufficient documentation

## 2022-02-03 DIAGNOSIS — R519 Headache, unspecified: Secondary | ICD-10-CM | POA: Diagnosis not present

## 2022-02-03 DIAGNOSIS — G459 Transient cerebral ischemic attack, unspecified: Secondary | ICD-10-CM | POA: Diagnosis not present

## 2022-02-03 DIAGNOSIS — I10 Essential (primary) hypertension: Secondary | ICD-10-CM | POA: Diagnosis not present

## 2022-02-03 DIAGNOSIS — R200 Anesthesia of skin: Secondary | ICD-10-CM | POA: Diagnosis not present

## 2022-02-03 DIAGNOSIS — R202 Paresthesia of skin: Secondary | ICD-10-CM | POA: Insufficient documentation

## 2022-02-03 DIAGNOSIS — Z20822 Contact with and (suspected) exposure to covid-19: Secondary | ICD-10-CM | POA: Diagnosis not present

## 2022-02-03 DIAGNOSIS — R531 Weakness: Secondary | ICD-10-CM | POA: Diagnosis not present

## 2022-02-03 DIAGNOSIS — R011 Cardiac murmur, unspecified: Secondary | ICD-10-CM

## 2022-02-03 LAB — RAPID URINE DRUG SCREEN, HOSP PERFORMED
Amphetamines: NOT DETECTED
Barbiturates: NOT DETECTED
Benzodiazepines: NOT DETECTED
Cocaine: NOT DETECTED
Opiates: NOT DETECTED
Tetrahydrocannabinol: NOT DETECTED

## 2022-02-03 LAB — CBC WITH DIFFERENTIAL/PLATELET
Abs Immature Granulocytes: 0.02 10*3/uL (ref 0.00–0.07)
Basophils Absolute: 0.1 10*3/uL (ref 0.0–0.1)
Basophils Relative: 1 %
Eosinophils Absolute: 0.2 10*3/uL (ref 0.0–0.5)
Eosinophils Relative: 3 %
HCT: 41 % (ref 36.0–46.0)
Hemoglobin: 13.1 g/dL (ref 12.0–15.0)
Immature Granulocytes: 0 %
Lymphocytes Relative: 37 %
Lymphs Abs: 3.6 10*3/uL (ref 0.7–4.0)
MCH: 28.2 pg (ref 26.0–34.0)
MCHC: 32 g/dL (ref 30.0–36.0)
MCV: 88.4 fL (ref 80.0–100.0)
Monocytes Absolute: 0.7 10*3/uL (ref 0.1–1.0)
Monocytes Relative: 7 %
Neutro Abs: 5 10*3/uL (ref 1.7–7.7)
Neutrophils Relative %: 52 %
Platelets: 276 10*3/uL (ref 150–400)
RBC: 4.64 MIL/uL (ref 3.87–5.11)
RDW: 12 % (ref 11.5–15.5)
WBC: 9.7 10*3/uL (ref 4.0–10.5)
nRBC: 0 % (ref 0.0–0.2)

## 2022-02-03 LAB — COMPREHENSIVE METABOLIC PANEL
ALT: 18 U/L (ref 0–44)
AST: 17 U/L (ref 15–41)
Albumin: 3.8 g/dL (ref 3.5–5.0)
Alkaline Phosphatase: 55 U/L (ref 38–126)
Anion gap: 4 — ABNORMAL LOW (ref 5–15)
BUN: 13 mg/dL (ref 8–23)
CO2: 29 mmol/L (ref 22–32)
Calcium: 9 mg/dL (ref 8.9–10.3)
Chloride: 109 mmol/L (ref 98–111)
Creatinine, Ser: 0.71 mg/dL (ref 0.44–1.00)
GFR, Estimated: >60 mL/min (ref >60)
Glucose, Bld: 135 mg/dL — ABNORMAL HIGH (ref 70–99)
Potassium: 3.2 mmol/L — ABNORMAL LOW (ref 3.5–5.1)
Sodium: 142 mmol/L (ref 135–145)
Total Bilirubin: 0.7 mg/dL (ref 0.3–1.2)
Total Protein: 6.8 g/dL (ref 6.5–8.1)

## 2022-02-03 LAB — RESP PANEL BY RT-PCR (FLU A&B, COVID) ARPGX2
Influenza A by PCR: NEGATIVE
Influenza B by PCR: NEGATIVE
SARS Coronavirus 2 by RT PCR: NEGATIVE

## 2022-02-03 LAB — URINALYSIS, ROUTINE W REFLEX MICROSCOPIC
Bilirubin Urine: NEGATIVE
Glucose, UA: NEGATIVE mg/dL
Hgb urine dipstick: NEGATIVE
Ketones, ur: NEGATIVE mg/dL
Nitrite: NEGATIVE
Protein, ur: NEGATIVE mg/dL
Specific Gravity, Urine: 1.011 (ref 1.005–1.030)
pH: 7 (ref 5.0–8.0)

## 2022-02-03 LAB — APTT: aPTT: 28 seconds (ref 24–36)

## 2022-02-03 LAB — PROTIME-INR
INR: 0.9 (ref 0.8–1.2)
Prothrombin Time: 11.8 seconds (ref 11.4–15.2)

## 2022-02-03 LAB — BRAIN NATRIURETIC PEPTIDE: B Natriuretic Peptide: 16 pg/mL (ref 0.0–100.0)

## 2022-02-03 LAB — TROPONIN I (HIGH SENSITIVITY)
Troponin I (High Sensitivity): 3 ng/L (ref <18)
Troponin I (High Sensitivity): 3 ng/L (ref <18)

## 2022-02-03 LAB — ETHANOL: Alcohol, Ethyl (B): <10 mg/dL (ref <10)

## 2022-02-03 MED ORDER — ASPIRIN 325 MG PO TABS
325.0000 mg | ORAL_TABLET | Freq: Every day | ORAL | Status: DC
  Administered 2022-02-03: 325 mg via ORAL
  Filled 2022-02-03: qty 1

## 2022-02-03 NOTE — ED Triage Notes (Signed)
Pt having left arm numbness that started around 545 am, went to bed 10 pm w/o any numbness in left arm, has been having generalized malaise since Saturday, saw Dr. Margo Aye and blood work done. ?

## 2022-02-03 NOTE — ED Provider Notes (Signed)
?[facility] ?Provider Note ? ? ?CSN: [id number] ?Arrival date & time: 02/03/22  1459 ? ?  ? ?History ? ?Chief Complaint  ?Patient presents with  ? Numbness  ?  Left arm  ? ? ?Jessica Kirby is a 66 y.o. female. ? ?HPI ? ?Patient with medical history notable for anxiety, GERD, hypertension, hypothyroid presents today due to weakness/left upper extremity numbness.  Patient states for about a week she has been feeling very fatigued and "weak all over".  She is tired, taking naps throughout the day which is not typical for her.  She was having intermittent chest pain, none today.  She states this morning around 930 she started having a "numbness/pins-and-needles" to her left upper extremity from her shoulder down to her fingertips.  This has been constant.  She also felt like her left lower extremity was weaker than normal, she has been able to ambulate without deficits.  Denies any vision changes, no exertional relationship.  No history of strokes or TIAs, not on anticoagulation.  She does endorse being under increased stress. ? ?Home Medications ?Prior to Admission medications   ?Medication Sig Start Date End Date Taking? Authorizing Provider  ?ALPRAZolam (XANAX) 0.5 MG tablet Take 1 tablet (0.5 mg total) by mouth at bedtime as needed for anxiety. 03/18/21  Yes Lovena Le, Malena M, DO  ?amLODipine (NORVASC) 10 MG tablet TAKE 1 TABLET(10 MG) BY MOUTH DAILY 05/29/21  Yes Lovena Le, Malena M, DO  ?aspirin EC 81 MG tablet Take 81 mg by mouth daily.    Yes [provider]  ?Calcium Carbonate-Vitamin D (CALCIUM-VITAMIN D3 PO) Take 1 capsule by mouth 2 (two) times daily.   Yes [provider]  ?famotidine (PEPCID) 20 MG tablet Take 1 tablet (20 mg total) by mouth as needed for heartburn or indigestion. 10/03/20  Yes Harvel Quale, MD  ?indapamide (LOZOL) 2.5 MG tablet TAKE ONE BY MOUTH EVERY AM MONDAYS AND FRIDAYS 05/15/21  Yes Lovena Le, Malena M, DO  ?levocetirizine (XYZAL) 5 MG tablet  Take 5 mg by mouth every evening.   Yes [provider]  ?Multiple Vitamins-Minerals (CENTRUM SILVER PO) Take 1 tablet by mouth daily.   Yes [provider]  ?Omega-3 Fatty Acids (FISH OIL) 1200 MG CPDR Take 1 capsule by mouth daily.   Yes [provider]  ?pantoprazole (PROTONIX) 20 MG tablet Take 1 tablet (20 mg total) by mouth daily. 10/09/21  Yes Harvel Quale, MD  ?triamcinolone (NASACORT) 55 MCG/ACT AERO nasal inhaler Place 2 sprays into the nose daily. As needed per patient.   Yes [provider]  ?blood glucose meter kit and supplies Dispense based on patient and insurance preference. Use to test glucose twice daily  (FOR ICD-10,  E11.9). 12/03/20   Elvia Collum M, DO  ?   ? ?Allergies    ?Fosamax [alendronate sodium], Vioxx [rofecoxib], Losartan, and Sulfa antibiotics   ? ?Review of Systems   ?Review of Systems ? ?Physical Exam ?Updated Vital Signs ?BP (!) 155/85   Pulse 85   Temp 98.4 ?F (36.9 ?C)   Resp 14   Ht '5\' 3"'  (1.6 m)   Wt 84.4 kg   SpO2 97%   BMI 32.95 kg/m?  ?Physical Exam ?Vitals and nursing note reviewed. Exam conducted with a chaperone present.  ?Constitutional:   ?   Appearance: Normal appearance.  ?HENT:  ?   Head: Normocephalic and atraumatic.  ?Eyes:  ?   General: No scleral icterus.    ?  Right eye: No discharge.     ?   Left eye: No discharge.  ?   Extraocular Movements: Extraocular movements intact.  ?   Pupils: Pupils are equal, round, and reactive to light.  ?Cardiovascular:  ?   Rate and Rhythm: Normal rate and regular rhythm.  ?   Pulses: Normal pulses.  ?   Heart sounds: Normal heart sounds. No murmur heard. ?  No friction rub. No gallop.  ?Pulmonary:  ?   Effort: Pulmonary effort is normal. No respiratory distress.  ?   Breath sounds: Normal breath sounds.  ?Abdominal:  ?   General: Abdomen is flat. Bowel sounds are normal. There is no distension.  ?   Palpations: Abdomen is soft.  ?   Tenderness: There is no abdominal  tenderness.  ?Skin: ?   General: Skin is warm and dry.  ?   Coloration: Skin is not jaundiced.  ?Neurological:  ?   General: No focal deficit present.  ?   Mental Status: She is alert. Mental status is at baseline.  ?   Cranial Nerves: No cranial nerve deficit.  ?   Coordination: Coordination normal.  ?   Gait: Gait normal.  ?   Comments: Grip strength equal bilaterally, lower extremity strength equal bilaterally.  No pronator drift, normal finger-nose.  Sensation light touch grossly intact bilaterally to upper and lower extremities.  Cranial nerves III through XII are grossly intact.  ? ? ?ED Results / Procedures / Treatments   ?Labs ?(all labs ordered are listed, but only abnormal results are displayed) ?Labs Reviewed  ?COMPREHENSIVE METABOLIC PANEL - Abnormal; Notable for the following components:  ?    Result Value  ? Potassium 3.2 (*)   ? Glucose, Bld 135 (*)   ? Anion gap 4 (*)   ? All other components within normal limits  ?URINALYSIS, ROUTINE W REFLEX MICROSCOPIC - Abnormal; Notable for the following components:  ? APPearance HAZY (*)   ? Leukocytes,Ua SMALL (*)   ? Bacteria, UA FEW (*)   ? All other components within normal limits  ?RESP PANEL BY RT-PCR (FLU A&B, COVID) ARPGX2  ?CBC WITH DIFFERENTIAL/PLATELET  ?BRAIN NATRIURETIC PEPTIDE  ?ETHANOL  ?PROTIME-INR  ?APTT  ?RAPID URINE DRUG SCREEN, HOSP PERFORMED  ?I-STAT CHEM 8, ED  ?TROPONIN I (HIGH SENSITIVITY)  ?TROPONIN I (HIGH SENSITIVITY)  ? ? ?EKG ?EKG Interpretation ? ?Date/Time:  Tuesday Feb 03 2022 15:55:44 EDT ?Ventricular Rate:  81 ?PR Interval:  194 ?QRS Duration: 89 ?QT Interval:  381 ?QTC Calculation: 443 ?R Axis:   11 ?Text Interpretation: Sinus rhythm Borderline T abnormalities, inferior leads Confirmed by Nanda Quinton [phone]) on 02/03/2022 4:21:58 PM ? ?Radiology ?DG Chest 2 View ? ?Result Date: 02/03/2022 ?CLINICAL DATA:  Weakness EXAM: CHEST - 2 VIEW COMPARISON:  03/15/2020 FINDINGS: The heart size and mediastinal contours are within normal  limits. Both lungs are clear. The visualized skeletal structures are unremarkable. IMPRESSION: No active cardiopulmonary disease. Electronically Signed   By: Donavan Foil M.D.   On: 02/03/2022 15:45  ? ?CT Head Wo Contrast ? ?Result Date: 02/03/2022 ?CLINICAL DATA:  TIA EXAM: CT HEAD WITHOUT CONTRAST TECHNIQUE: Contiguous axial images were obtained from the base of the skull through the vertex without intravenous contrast. RADIATION DOSE REDUCTION: This exam was performed according to the departmental dose-optimization program which includes automated exposure control, adjustment of the mA and/or kV according to patient size and/or use of iterative reconstruction technique. COMPARISON:  CT head 03/15/2020 FINDINGS: Brain:  No evidence of acute infarction, hemorrhage, hydrocephalus, extra-axial collection or mass lesion/mass effect. Small cysts in the right parietal white matter unchanged most likely perivascular spaces. Vascular: Negative for hyperdense vessel Skull: Negative Sinuses/Orbits: Negative Other: None IMPRESSION: Negative CT head Electronically Signed   By: Franchot Gallo M.D.   On: 02/03/2022 15:50  ? ?MR BRAIN WO CONTRAST ? ?Result Date: 02/03/2022 ?CLINICAL DATA:  Bilateral arm numbness, headache EXAM: MRI HEAD WITHOUT CONTRAST MRI CERVICAL SPINE WITHOUT CONTRAST TECHNIQUE: Multiplanar, multiecho pulse sequences of the brain and surrounding structures, and cervical spine, to include the craniocervical junction and cervicothoracic junction, were obtained without intravenous contrast. COMPARISON:  MRI head 07/09/2011 common correlation is also made with CT head 02/03/2022 FINDINGS: Evaluation is somewhat limited by motion artifact. MRI HEAD FINDINGS Brain: No restricted diffusion to suggest acute or subacute infarct. No acute hemorrhage, mass, mass effect, or midline shift. No hydrocephalus or extra-axial collection. No hemosiderin deposition to suggest remote hemorrhage. Scattered T2 hyperintense signal in  the periventricular white matter, likely the sequela of mild chronic small vessel ischemic disease. Vascular: Normal flow voids. Skull and upper cervical spine: Normal marrow signal. Sinuses/Orbits: Negative. Other: The

## 2022-02-03 NOTE — Discharge Instructions (Signed)
Work-up today was reassuring.  Continue your home medicine as prescribed.  Follow-up with your primary care doctor later this week for reevaluation.  Return if things change or worsen. ?

## 2022-02-09 DIAGNOSIS — R011 Cardiac murmur, unspecified: Secondary | ICD-10-CM | POA: Diagnosis not present

## 2022-02-09 DIAGNOSIS — R202 Paresthesia of skin: Secondary | ICD-10-CM | POA: Diagnosis not present

## 2022-02-12 ENCOUNTER — Ambulatory Visit (HOSPITAL_COMMUNITY)
Admission: RE | Admit: 2022-02-12 | Discharge: 2022-02-12 | Disposition: A | Discharge status: Home / Self Care | Payer: Medicare Other | Source: Ambulatory Visit | Attending: Family Medicine | Admitting: Family Medicine

## 2022-02-12 DIAGNOSIS — R011 Cardiac murmur, unspecified: Secondary | ICD-10-CM | POA: Insufficient documentation

## 2022-02-12 LAB — ECHOCARDIOGRAM COMPLETE
Area-P 1/2: 4.39 cm2
S' Lateral: 2.3 cm

## 2022-02-12 NOTE — Progress Notes (Signed)
*  PRELIMINARY RESULTS* Echocardiogram 2D Echocardiogram has been performed.  Jessica Kirby 02/12/2022, 2:42 PM

## 2022-02-18 DIAGNOSIS — M79674 Pain in right toe(s): Secondary | ICD-10-CM | POA: Diagnosis not present

## 2022-02-18 DIAGNOSIS — E1140 Type 2 diabetes mellitus with diabetic neuropathy, unspecified: Secondary | ICD-10-CM | POA: Diagnosis not present

## 2022-02-18 DIAGNOSIS — L110 Acquired keratosis follicularis: Secondary | ICD-10-CM | POA: Diagnosis not present

## 2022-02-18 DIAGNOSIS — M79675 Pain in left toe(s): Secondary | ICD-10-CM | POA: Diagnosis not present

## 2022-02-18 DIAGNOSIS — M79671 Pain in right foot: Secondary | ICD-10-CM | POA: Diagnosis not present

## 2022-02-18 DIAGNOSIS — M79672 Pain in left foot: Secondary | ICD-10-CM | POA: Diagnosis not present

## 2022-03-06 ENCOUNTER — Other Ambulatory Visit: Payer: Self-pay | Admitting: Family Medicine

## 2022-03-06 DIAGNOSIS — F418 Other specified anxiety disorders: Secondary | ICD-10-CM

## 2022-03-11 ENCOUNTER — Ambulatory Visit
Admission: EM | Admit: 2022-03-11 | Discharge: 2022-03-11 | Disposition: A | Discharge status: Home / Self Care | Payer: Medicare Other | Attending: Family Medicine | Admitting: Family Medicine

## 2022-03-11 ENCOUNTER — Other Ambulatory Visit: Payer: Self-pay

## 2022-03-11 ENCOUNTER — Encounter: Payer: Self-pay | Admitting: Emergency Medicine

## 2022-03-11 DIAGNOSIS — R2243 Localized swelling, mass and lump, lower limb, bilateral: Secondary | ICD-10-CM

## 2022-03-11 DIAGNOSIS — T63441A Toxic effect of venom of bees, accidental (unintentional), initial encounter: Secondary | ICD-10-CM | POA: Diagnosis not present

## 2022-03-11 DIAGNOSIS — M7989 Other specified soft tissue disorders: Secondary | ICD-10-CM

## 2022-03-11 MED ORDER — DEXAMETHASONE SODIUM PHOSPHATE 10 MG/ML IJ SOLN
10.0000 mg | Freq: Once | INTRAMUSCULAR | Status: AC
  Administered 2022-03-11: 10 mg via INTRAMUSCULAR

## 2022-03-11 NOTE — ED Triage Notes (Signed)
Pt reports was mowing this afternoon and reports was stung by several yellow jackets on bilateral ankles. Pt reports intense pain and swelling to bilateral ankles. Pt reports tried baking soda,epsom salt water, and benadryl with no change in pain.

## 2022-03-11 NOTE — Discharge Instructions (Signed)
Take your antihistamine twice a day until your symptoms improve

## 2022-03-11 NOTE — ED Provider Notes (Signed)
[facility]    CSN: [id number] Arrival date & time: 03/11/22  1436      History   Chief Complaint Chief Complaint  Patient presents with   Insect Bite    HPI Jessica Kirby is a 66 y.o. female.   Presenting today with bilateral ankle swelling, pain after being stung several times by yellow jackets while mowing this evening.  She reports intense pain but no significant redness, fevers, rashes elsewhere, nausea, vomiting, chest tightness, wheezing, throat itching or swelling.  Applied some Benadryl cream and soaked her lower legs and baking soda and Epsom salts with no improvement.    Past Medical History:  Diagnosis Date   Allergy    Anxiety    Insomnia   Depression    Insomnia   Diabetes mellitus without complication ([place]) 12/27/6604   GERD (gastroesophageal reflux disease)    Herniated disc    Hypertension    Hypothyroidism    not medicated at this time   Osteoporosis    Thyroid disease    Hypothyroidism    Patient Active Problem List   Diagnosis Date Noted   Diabetes mellitus without complication ([place]) 30/16/0109   Constipation 10/03/2020   Acute non-recurrent maxillary sinusitis 07/09/2020   Dysuria 05/21/2020   Acute midline low back pain without sciatica 05/21/2020   Vitamin D deficiency 02/16/2020   Diverticulosis of colon without hemorrhage    Anxiety 11/04/2015   Primary osteoarthritis of both hands 11/04/2015   Chest pain 07/20/2015   Hypokalemia 07/20/2015   Pain in the chest    Morbid obesity due to excess calories ([place])    Persistent cough 07/04/2015   GERD (gastroesophageal reflux disease) 09/27/2013   Hypothyroidism 12/24/2012   Vasomotor rhinitis 12/24/2012   Hearing decreased 12/24/2012   Depression with anxiety 12/24/2012   Hypertension    Osteopenia     Past Surgical History:  Procedure Laterality Date   ABDOMINAL HYSTERECTOMY     COLONOSCOPY N/A 04/07/2016   Procedure: COLONOSCOPY;  Surgeon: Daneil Dolin, MD;   [facility];  Service: Endoscopy;  Laterality: N/A;  8:30 AM   TUBAL LIGATION     WRIST SURGERY      OB History   No obstetric history on file.    Home Medications    Prior to Admission medications   Medication Sig Start Date End Date Taking? Authorizing Provider  ALPRAZolam Duanne Moron) 0.5 MG tablet Take 1 tablet (0.5 mg total) by mouth at bedtime as needed for anxiety. 03/18/21   Lovena Le, Malena M, DO  amLODipine (NORVASC) 10 MG tablet TAKE 1 TABLET(10 MG) BY MOUTH DAILY 05/29/21   Elvia Collum M, DO  aspirin EC 81 MG tablet Take 81 mg by mouth daily.     [provider]  blood glucose meter kit and supplies Dispense based on patient and insurance preference. Use to test glucose twice daily  (FOR ICD-10,  E11.9). 12/03/20   Elvia Collum M, DO  Calcium Carbonate-Vitamin D (CALCIUM-VITAMIN D3 PO) Take 1 capsule by mouth 2 (two) times daily.    [provider]  famotidine (PEPCID) 20 MG tablet Take 1 tablet (20 mg total) by mouth as needed for heartburn or indigestion. 10/03/20   Harvel Quale, MD  indapamide (LOZOL) 2.5 MG tablet TAKE ONE BY MOUTH EVERY AM MONDAYS AND FRIDAYS 05/15/21   Lovena Le, Malena M, DO  levocetirizine (XYZAL) 5 MG tablet Take 5 mg by mouth every evening.    [provider]  Multiple Vitamins-Minerals (CENTRUM SILVER PO) Take 1 tablet by mouth daily.    [provider]  Omega-3 Fatty Acids (FISH OIL) 1200 MG CPDR Take 1 capsule by mouth daily.    [provider]  pantoprazole (PROTONIX) 20 MG tablet Take 1 tablet (20 mg total) by mouth daily. 10/09/21   Harvel Quale, MD  triamcinolone (NASACORT) 55 MCG/ACT AERO nasal inhaler Place 2 sprays into the nose daily. As needed per patient.    [provider]    Family History Family History  Problem Relation Age of Onset   Diabetes Father    Heart attack Mother    Heart disease Mother    Bladder Cancer Other    Colon cancer Neg Hx      Social History Social History   Tobacco Use   Smoking status: Never    Passive exposure: Yes   Smokeless tobacco: Never  Vaping Use   Vaping Use: Never used  Substance Use Topics   Alcohol use: No    Alcohol/week: 0.0 standard drinks of alcohol   Drug use: No     Allergies   Fosamax [alendronate sodium], Vioxx [rofecoxib], Losartan, and Sulfa antibiotics   Review of Systems Review of Systems Per HPI  Physical Exam Triage Vital Signs ED Triage Vitals [03/11/22 1613]  Enc Vitals Group     BP (!) 178/96     Pulse Rate 86     Resp 18     Temp 98 F (36.7 C)     Temp Source Oral     SpO2 94 %     Weight      Height      Head Circumference      Peak Flow      Pain Score 8     Pain Loc      Pain Edu?      Excl. in [place]?    No data found.  Updated Vital Signs BP (!) 178/96 (BP Location: Right Arm)   Pulse 86   Temp 98 F (36.7 C) (Oral)   Resp 18   SpO2 94%   Visual Acuity Right Eye Distance:   Left Eye Distance:   Bilateral Distance:    Right Eye Near:   Left Eye Near:    Bilateral Near:     Physical Exam Vitals and nursing note reviewed.  Constitutional:      Appearance: Normal appearance. She is not ill-appearing.  HENT:     Head: Atraumatic.  Eyes:     Extraocular Movements: Extraocular movements intact.     Conjunctiva/sclera: Conjunctivae normal.  Cardiovascular:     Rate and Rhythm: Normal rate and regular rhythm.     Heart sounds: Normal heart sounds.  Pulmonary:     Effort: Pulmonary effort is normal.     Breath sounds: Normal breath sounds.  Musculoskeletal:        General: Normal range of motion.     Cervical back: Normal range of motion and neck supple.  Skin:    General: Skin is warm and dry.     Comments: Pinpoint erythematous maculopapular lesions 1 to each ankle, mild diffuse edema to bilateral ankles, tender to palpation diffusely  Neurological:     Mental Status: She is alert and oriented to person, place, and time.      Motor: No weakness.     Gait: Gait normal.  Psychiatric:        Mood and Affect: Mood normal.  Thought Content: Thought content normal.        Judgment: Judgment normal.      UC Treatments / Results  Labs (all labs ordered are listed, but only abnormal results are displayed) Labs Reviewed - No data to display  EKG   Radiology No results found.  Procedures Procedures (including critical care time)  Medications Ordered in UC Medications  dexamethasone (DECADRON) injection 10 mg (has no administration in time range)    Initial Impression / Assessment and Plan / UC Course  I have reviewed the triage vital signs and the nursing notes.  Pertinent labs & imaging results that were available during my care of the patient were reviewed by me and considered in my medical decision making (see chart for details).     No evidence of a systemic allergic reaction, suspect site reaction at this time.  Treat with IM Decadron, antihistamines twice daily, ice, elevation.  Return for worsening symptoms.  Final Clinical Impressions(s) / UC Diagnoses   Final diagnoses:  Leg swelling  Bee sting, accidental or unintentional, initial encounter     Discharge Instructions      Take your antihistamine twice a day until your symptoms improve    ED Prescriptions   None    PDMP not reviewed this encounter.   Volney American, [place] 03/11/22 1634

## 2022-03-12 DIAGNOSIS — M25774 Osteophyte, right foot: Secondary | ICD-10-CM | POA: Diagnosis not present

## 2022-03-12 DIAGNOSIS — M79671 Pain in right foot: Secondary | ICD-10-CM | POA: Diagnosis not present

## 2022-03-13 DIAGNOSIS — M542 Cervicalgia: Secondary | ICD-10-CM | POA: Diagnosis not present

## 2022-03-13 DIAGNOSIS — M9902 Segmental and somatic dysfunction of thoracic region: Secondary | ICD-10-CM | POA: Diagnosis not present

## 2022-03-13 DIAGNOSIS — M9901 Segmental and somatic dysfunction of cervical region: Secondary | ICD-10-CM | POA: Diagnosis not present

## 2022-03-13 DIAGNOSIS — M546 Pain in thoracic spine: Secondary | ICD-10-CM | POA: Diagnosis not present

## 2022-03-25 ENCOUNTER — Ambulatory Visit (INDEPENDENT_AMBULATORY_CARE_PROVIDER_SITE_OTHER): Payer: Medicare Other | Admitting: Family Medicine

## 2022-03-25 VITALS — BP 157/90 | HR 77 | Temp 98.0°F | Ht 63.0 in | Wt 194.6 lb

## 2022-03-25 DIAGNOSIS — F419 Anxiety disorder, unspecified: Secondary | ICD-10-CM | POA: Diagnosis not present

## 2022-03-25 DIAGNOSIS — E785 Hyperlipidemia, unspecified: Secondary | ICD-10-CM | POA: Insufficient documentation

## 2022-03-25 DIAGNOSIS — E119 Type 2 diabetes mellitus without complications: Secondary | ICD-10-CM | POA: Diagnosis not present

## 2022-03-25 DIAGNOSIS — I10 Essential (primary) hypertension: Secondary | ICD-10-CM

## 2022-03-25 DIAGNOSIS — F418 Other specified anxiety disorders: Secondary | ICD-10-CM

## 2022-03-25 MED ORDER — HYDROCHLOROTHIAZIDE 25 MG PO TABS: 25.0000 mg | ORAL_TABLET | Freq: Every day | ORAL | 1 refills | Status: DC

## 2022-03-25 MED ORDER — ALPRAZOLAM 0.5 MG PO TABS: 0.5000 mg | ORAL_TABLET | Freq: Every day | ORAL | 3 refills | Status: DC | PRN

## 2022-03-25 NOTE — Assessment & Plan Note (Signed)
Unsure of control.  She is not on statin therapy.  Awaiting lipid panel.

## 2022-03-25 NOTE — Patient Instructions (Addendum)
Start HCTZ. Lab in 7-10 days to check potassium.  Continue your other meds.  Take care  Dr. Adriana Simas

## 2022-03-25 NOTE — Assessment & Plan Note (Signed)
Uses Xanax sparingly.  Refilled today.

## 2022-03-25 NOTE — Assessment & Plan Note (Signed)
Appears to be controlled. Awaiting A1c.

## 2022-03-25 NOTE — Assessment & Plan Note (Addendum)
Uncontrolled.  Stopping Indapamide.  Starting HCTZ. Metabolic panel in 7-10 days.

## 2022-03-25 NOTE — Progress Notes (Signed)
Subjective:  Patient ID: Jessica Kirby, female    DOB: [birth year]  Age: 66 y.o. MRN: [id number]  CC: Chief Complaint  Patient presents with   Establish Care    Former patient of Dr.Hall. morning blood sugars will be in the 100s but as the day goes on pt can get it down to 90s. Pt is aware of eating habits.     HPI:  66 year old female with hypertension, hyperlipidemia, GERD, type 2 diabetes, osteopenia, anxiety presents to establish care.  Patient brings in a copy of her most recent labs.  A1c is not available.  She is not currently on any pharmacotherapy regarding her diabetes.  She has been managing it with lifestyle changes.  Last A1c available was 5.6.  Hypertension is uncontrolled.  She is currently on amlodipine 10 mg daily.  She states that she is also taking indapamide but only takes this 2 days a week.  She has had prior issues with hypokalemia.  Has a history of hyperlipidemia.  No recent lipid panels.  No current pharmacotherapy.  Patient reports that she has an upcoming appointment with cardiology.  Had a previous ED visit regarding chest pain and paresthesia.  Patient Active Problem List   Diagnosis Date Noted   Hyperlipidemia 03/25/2022   Diabetes mellitus without complication (HCC) 11/28/2020   Anxiety 11/04/2015   GERD (gastroesophageal reflux disease) 09/27/2013   Hypertension    Osteopenia     Social Hx   Social History   Socioeconomic History   Marital status: Widowed    Spouse name: Not on file   Number of children: Not on file   Years of education: Not on file   Highest education level: Not on file  Occupational History   Not on file  Tobacco Use   Smoking status: Never    Passive exposure: Yes   Smokeless tobacco: Never  Vaping Use   Vaping Use: Never used  Substance and Sexual Activity   Alcohol use: No    Alcohol/week: 0.0 standard drinks of alcohol   Drug use: No   Sexual activity: Yes  Other Topics Concern   Not on file  Social  History Narrative   Not on file   Social Determinants of Health   Financial Resource Strain: Not on file  Food Insecurity: Not on file  Transportation Needs: Not on file  Physical Activity: Not on file  Stress: Not on file  Social Connections: Not on file    Review of Systems  Constitutional: Negative.   Cardiovascular:  Positive for leg swelling. Negative for chest pain.   Objective:  BP (!) 157/90   Pulse 77   Temp 98 F (36.7 C)   Ht 5\' 3"  (1.6 m)   Wt 194 lb 9.6 oz (88.3 kg)   SpO2 94%   BMI 34.47 kg/m      03/25/2022    8:48 AM 03/11/2022    4:13 PM 02/03/2022    8:00 PM  BP/Weight  Systolic BP 157 178 155  Diastolic BP 90 96 88  Wt. (Lbs) 194.6    BMI 34.47 kg/m2      Physical Exam Vitals and nursing note reviewed.  Constitutional:      General: She is not in acute distress.    Appearance: Normal appearance. She is not ill-appearing.  HENT:     Head: Normocephalic and atraumatic.  Eyes:     General:        Right eye: No discharge.  Left eye: No discharge.     Conjunctiva/sclera: Conjunctivae normal.  Cardiovascular:     Rate and Rhythm: Normal rate and regular rhythm.  Pulmonary:     Effort: Pulmonary effort is normal.     Breath sounds: Normal breath sounds. No wheezing, rhonchi or rales.  Neurological:     Mental Status: She is alert.  Psychiatric:        Mood and Affect: Mood normal.        Behavior: Behavior normal.    Lab Results  Component Value Date   WBC 9.7 02/03/2022   HGB 13.1 02/03/2022   HCT 41.0 02/03/2022   PLT 276 02/03/2022   GLUCOSE 135 (H) 02/03/2022   CHOL 177 03/07/2021   TRIG 150 (H) 03/07/2021   HDL 38 (L) 03/07/2021   LDLCALC 112 (H) 03/07/2021   ALT 18 02/03/2022   AST 17 02/03/2022   NA 142 02/03/2022   K 3.2 (L) 02/03/2022   CL 109 02/03/2022   CREATININE 0.71 02/03/2022   BUN 13 02/03/2022   CO2 29 02/03/2022   TSH 2.990 02/16/2020   INR 0.9 02/03/2022   HGBA1C 5.6 03/07/2021     Assessment &  Plan:   Problem List Items Addressed This Visit       Cardiovascular and Mediastinum   Hypertension    Uncontrolled.  Stopping Indapamide.  Starting HCTZ. Metabolic panel in 7-10 days.      Relevant Medications   hydrochlorothiazide (HYDRODIURIL) 25 MG tablet     Endocrine   Diabetes mellitus without complication (HCC) - Primary    Appears to be controlled. Awaiting A1c.      Relevant Orders   Microalbumin / creatinine urine ratio   Lipid panel   Hemoglobin A1c   Basic Metabolic Panel     Other   Hyperlipidemia    Unsure of control.  She is not on statin therapy.  Awaiting lipid panel.      Relevant Medications   hydrochlorothiazide (HYDRODIURIL) 25 MG tablet   Anxiety    Uses Xanax sparingly.  Refilled today.      Relevant Medications   ALPRAZolam (XANAX) 0.5 MG tablet   Other Visit Diagnoses     Depression with anxiety       Relevant Medications   ALPRAZolam (XANAX) 0.5 MG tablet       Meds ordered this encounter  Medications   hydrochlorothiazide (HYDRODIURIL) 25 MG tablet    Sig: Take 1 tablet (25 mg total) by mouth daily.    Dispense:  90 tablet    Refill:  1   DISCONTD: ALPRAZolam (XANAX) 0.5 MG tablet    Sig: Take 1 tablet (0.5 mg total) by mouth daily as needed for anxiety.    Dispense:  30 tablet    Refill:  3   ALPRAZolam (XANAX) 0.5 MG tablet    Sig: Take 1 tablet (0.5 mg total) by mouth daily as needed for anxiety.    Dispense:  30 tablet    Refill:  3    Follow-up:  Return in about 3 months (around 06/25/2022).  Everlene Other DO [facility]

## 2022-03-26 ENCOUNTER — Ambulatory Visit: Payer: Medicare Other | Admitting: Internal Medicine

## 2022-03-26 ENCOUNTER — Encounter: Payer: Self-pay | Admitting: Internal Medicine

## 2022-03-26 VITALS — BP 134/82 | HR 76 | Ht 62.0 in | Wt 193.0 lb

## 2022-03-26 DIAGNOSIS — R079 Chest pain, unspecified: Secondary | ICD-10-CM

## 2022-03-26 DIAGNOSIS — R9431 Abnormal electrocardiogram [ECG] [EKG]: Secondary | ICD-10-CM

## 2022-03-26 DIAGNOSIS — R5383 Other fatigue: Secondary | ICD-10-CM

## 2022-03-26 DIAGNOSIS — E785 Hyperlipidemia, unspecified: Secondary | ICD-10-CM | POA: Diagnosis not present

## 2022-03-26 NOTE — Patient Instructions (Signed)
Medication Instructions:  Your physician recommends that you continue on your current medications as directed. Please refer to the Current Medication list given to you today.   Labwork: None  Testing/Procedures: Your physician has requested that you have a exercise myoview. For further information please visit [email or web address] Please follow instruction sheet, as given.   Follow-Up: Follow up after testing with Dr. Rennis Golden  Any Other Special Instructions Will Be Listed Below (If Applicable).     If you need a refill on your cardiac medications before your next appointment, please call your pharmacy.

## 2022-03-27 NOTE — Progress Notes (Signed)
OFFICE CONSULT NOTE  Chief Complaint:  Murmur  Primary Care Physician: Jessica Spikes, DO  HPI:  Jessica Kirby is a 66 y.o. female who is being seen today for the evaluation of murmur at the request of Jessica Jude, FNP.  This is a pleasant 66 year old female kindly referred for evaluation of heart murmur.  She was seen by primary care provider noted to have a heart murmur but beyond that she reports having some chest discomfort and fatigue with exertion.  She recently has lost a lot of weight and is exercising fairly regularly.  She notes when she exercises however she gets significantly diaphoretic however this is a good 10 to 15 minutes into her exercise.  Generally she does about 3 and a half miles per hour on a flat treadmill.  She does not necessarily get chest discomfort with this but notes that she fatigues easily doing certain activities.  An echocardiogram was ordered to evaluate her murmur prior to this visit.  This demonstrated LVEF 7075%, no regional wall motion abnormalities and mild LVH.  Diastolic function was considered normal.  No significant valvular disease was noted.  She does report family history of heart disease.  Other risk factors include type 2 diabetes, obesity with recent significant weight loss, hypertension and hypothyroidism.  PMHx:  Past Medical History:  Diagnosis Date   Allergy    Anxiety    Insomnia   Depression    Insomnia   Diabetes mellitus without complication ([place]) 05/06/2799   GERD (gastroesophageal reflux disease)    Herniated disc    Hypertension    Hypothyroidism    not medicated at this time   Osteoporosis    Thyroid disease    Hypothyroidism    Past Surgical History:  Procedure Laterality Date   ABDOMINAL HYSTERECTOMY     COLONOSCOPY N/A 04/07/2016   Procedure: COLONOSCOPY;  Surgeon: Daneil Dolin, MD;  [facility];  Service: Endoscopy;  Laterality: N/A;  8:30 AM   TUBAL LIGATION     WRIST SURGERY      FAMHx:   Family History  Problem Relation Age of Onset   Diabetes Father    Heart attack Mother    Heart disease Mother    Bladder Cancer Other    Colon cancer Neg Hx     SOCHx:   reports that she has never smoked. She has been exposed to tobacco smoke. She has never used smokeless tobacco. She reports that she does not drink alcohol and does not use drugs.  ALLERGIES:  Allergies  Allergen Reactions   Fosamax [Alendronate Sodium] Other (See Comments)    GI Headache   Vioxx [Rofecoxib]     Per patient   Losartan Cough   Sulfa Antibiotics Hives    ROS: Pertinent items noted in HPI and remainder of comprehensive ROS otherwise negative.  HOME MEDS: Current Outpatient Medications on File Prior to Visit  Medication Sig Dispense Refill   ALPRAZolam (XANAX) 0.5 MG tablet Take 1 tablet (0.5 mg total) by mouth daily as needed for anxiety. 30 tablet 3   amLODipine (NORVASC) 10 MG tablet TAKE 1 TABLET(10 MG) BY MOUTH DAILY 90 tablet 1   aspirin EC 81 MG tablet Take 81 mg by mouth daily.      blood glucose meter kit and supplies Dispense based on patient and insurance preference. Use to test glucose twice daily  (FOR ICD-10,  E11.9). 1 each 5   Calcium Carbonate-Vitamin D (CALCIUM-VITAMIN D3  PO) Take 1 capsule by mouth 2 (two) times daily.     hydrochlorothiazide (HYDRODIURIL) 25 MG tablet Take 1 tablet (25 mg total) by mouth daily. 90 tablet 1   levocetirizine (XYZAL) 5 MG tablet Take 5 mg by mouth every evening.     Multiple Vitamins-Minerals (CENTRUM SILVER PO) Take 1 tablet by mouth daily.     Omega-3 Fatty Acids (FISH OIL) 1200 MG CPDR Take 1 capsule by mouth daily.     pantoprazole (PROTONIX) 20 MG tablet Take 1 tablet (20 mg total) by mouth daily. 90 tablet 3   triamcinolone (NASACORT) 55 MCG/ACT AERO nasal inhaler Place 2 sprays into the nose daily. As needed per patient.     No current facility-administered medications on file prior to visit.    LABS/IMAGING: No results found for  this or any previous visit (from the past 48 hour(s)). No results found.  LIPID PANEL:    Component Value Date/Time   CHOL 177 03/07/2021 0754   TRIG 150 (H) 03/07/2021 0754   HDL 38 (L) 03/07/2021 0754   CHOLHDL 4.7 (H) 03/07/2021 0754   CHOLHDL 3.9 06/25/2014 0749   VLDL 17 06/25/2014 0749   LDLCALC 112 (H) 03/07/2021 0754    WEIGHTS: Wt Readings from Last 3 Encounters:  03/26/22 193 lb (87.5 kg)  03/25/22 194 lb 9.6 oz (88.3 kg)  02/03/22 186 lb (84.4 kg)    VITALS: BP 134/82   Pulse 76   Ht '5\' 2"'  (1.575 m)   Wt 193 lb (87.5 kg)   SpO2 97%   BMI 35.30 kg/m   EXAM: General appearance: alert, no distress, and moderately obese Neck: no carotid bruit, no JVD, and thyroid not enlarged, symmetric, no tenderness/mass/nodules Lungs: clear to auscultation bilaterally Heart: regular rate and rhythm, S1, S2 normal, and systolic murmur: early systolic 2/6, crescendo at 2nd right intercostal space Abdomen: soft, non-tender; bowel sounds normal; no masses,  no organomegaly Extremities: extremities normal, atraumatic, no cyanosis or edema Pulses: 2+ and symmetric Skin: Skin color, texture, turgor normal. No rashes or lesions Neurologic: Grossly normal Psych: Pleasant  EKG: Sinus rhythm at 74, inferior T wave inversions- personally reviewed  ASSESSMENT: Chest pressure and fatigue with exertion Probable flow murmur Abnormal EKG Family history of heart disease Type 2 diabetes Hypertension Obesity with recent weight loss  PLAN: 1.   [name] has been having some chest pressure and fatigue with exertion.  She was noted to have murmur however echo does not show any significant valvular disease.  It is quite a soft systolic murmur, likely a flow murmur.  Her EKG is abnormal showing T wave inversions in leads III and aVF.  Looking back she had some abnormal EKGs which looks similar as early as 2016, so I do not think this is a new finding but it is abnormal.  It does seem to  be somewhat worse on this EKG.  Based on some of her exertional symptoms, particularly with exercise, I would like for her to undergo an exercise Myoview stress test.  I discussed the risk and benefits of this procedure and alternatives with her and she would be willing to proceed.  Plan follow-up with me afterwards.  Thanks again for the kind referral.  Pixie Casino, MD, FACC, [place] Director of the [facility]  [facility] Diplomate of the American Board of Clinical Lipidology Attending Cardiologist  Direct Dial: [phone]  Fax: [phone]  Website:  [email or web address] 03/27/2022, 12:55 PM

## 2022-04-01 DIAGNOSIS — E119 Type 2 diabetes mellitus without complications: Secondary | ICD-10-CM | POA: Diagnosis not present

## 2022-04-02 ENCOUNTER — Encounter (HOSPITAL_COMMUNITY): Payer: Self-pay

## 2022-04-02 ENCOUNTER — Encounter (HOSPITAL_COMMUNITY)
Admission: RE | Admit: 2022-04-02 | Discharge: 2022-04-02 | Disposition: A | Discharge status: Home / Self Care | Payer: Medicare Other | Source: Ambulatory Visit | Attending: Internal Medicine | Admitting: Internal Medicine

## 2022-04-02 ENCOUNTER — Ambulatory Visit (HOSPITAL_COMMUNITY)
Admission: RE | Admit: 2022-04-02 | Discharge: 2022-04-02 | Disposition: A | Discharge status: Home / Self Care | Payer: Medicare Other | Source: Ambulatory Visit | Attending: Internal Medicine | Admitting: Internal Medicine

## 2022-04-02 ENCOUNTER — Encounter (HOSPITAL_COMMUNITY): Payer: Medicare Other

## 2022-04-02 DIAGNOSIS — R9431 Abnormal electrocardiogram [ECG] [EKG]: Secondary | ICD-10-CM | POA: Diagnosis not present

## 2022-04-02 DIAGNOSIS — R079 Chest pain, unspecified: Secondary | ICD-10-CM | POA: Diagnosis not present

## 2022-04-02 LAB — LIPID PANEL
Chol/HDL Ratio: 3.7 ratio (ref 0.0–4.4)
Cholesterol, Total: 192 mg/dL (ref 100–199)
HDL: 52 mg/dL (ref >39)
LDL Chol Calc (NIH): 120 mg/dL — ABNORMAL HIGH (ref 0–99)
Triglycerides: 113 mg/dL (ref 0–149)
VLDL Cholesterol Cal: 20 mg/dL (ref 5–40)

## 2022-04-02 LAB — NM MYOCAR MULTI W/SPECT W/WALL MOTION / EF
Angina Index: 0
Estimated workload: 7
Exercise duration (min): 6 min
Exercise duration (sec): 5 s
LV dias vol: 84 mL (ref 46–106)
LV sys vol: 23 mL
MPHR: 153 {beats}/min
Nuc Stress EF: 72 %
Peak HR: 136 {beats}/min
Percent HR: 88 %
RATE: 0.3
RPE: 16
Rest HR: 74 {beats}/min
Rest Nuclear Isotope Dose: 11 mCi
SDS: 0
SRS: 1
SSS: 1
Stress Nuclear Isotope Dose: 33 mCi
TID: 1.02

## 2022-04-02 LAB — BASIC METABOLIC PANEL
BUN/Creatinine Ratio: 18 (ref 12–28)
BUN: 12 mg/dL (ref 8–27)
CO2: 25 mmol/L (ref 20–29)
Calcium: 9 mg/dL (ref 8.7–10.3)
Chloride: 105 mmol/L (ref 96–106)
Creatinine, Ser: 0.67 mg/dL (ref 0.57–1.00)
Glucose: 100 mg/dL — ABNORMAL HIGH (ref 70–99)
Potassium: 3.6 mmol/L (ref 3.5–5.2)
Sodium: 142 mmol/L (ref 134–144)
eGFR: 96 mL/min/{1.73_m2} (ref >59)

## 2022-04-02 LAB — HEMOGLOBIN A1C
Est. average glucose Bld gHb Est-mCnc: 120 mg/dL
Hgb A1c MFr Bld: 5.8 % — ABNORMAL HIGH (ref 4.8–5.6)

## 2022-04-02 LAB — MICROALBUMIN / CREATININE URINE RATIO
Creatinine, Urine: 24.5 mg/dL
Microalb/Creat Ratio: <12 mg/g creat (ref 0–29)
Microalbumin, Urine: <3 ug/mL

## 2022-04-02 MED ORDER — TECHNETIUM TC 99M TETROFOSMIN IV KIT
10.0000 | PACK | Freq: Once | INTRAVENOUS | Status: AC | PRN
  Administered 2022-04-02: 11 via INTRAVENOUS

## 2022-04-02 MED ORDER — TECHNETIUM TC 99M TETROFOSMIN IV KIT
30.0000 | PACK | Freq: Once | INTRAVENOUS | Status: AC | PRN
Start: 2022-04-02 — End: 2022-04-02
  Administered 2022-04-02: 33 via INTRAVENOUS

## 2022-04-02 MED ORDER — SODIUM CHLORIDE FLUSH 0.9 % IV SOLN
INTRAVENOUS | Status: AC
  Administered 2022-04-02: 10 mL via INTRAVENOUS
  Filled 2022-04-02: qty 10

## 2022-04-02 MED ORDER — REGADENOSON 0.4 MG/5ML IV SOLN
INTRAVENOUS | Status: AC
  Filled 2022-04-02: qty 5

## 2022-04-08 ENCOUNTER — Ambulatory Visit (INDEPENDENT_AMBULATORY_CARE_PROVIDER_SITE_OTHER): Payer: Medicare Other | Admitting: Nurse Practitioner

## 2022-04-08 VITALS — BP 129/78 | HR 77 | Temp 97.4°F | Ht 62.0 in | Wt 193.0 lb

## 2022-04-08 DIAGNOSIS — M7989 Other specified soft tissue disorders: Secondary | ICD-10-CM

## 2022-04-08 DIAGNOSIS — E785 Hyperlipidemia, unspecified: Secondary | ICD-10-CM | POA: Diagnosis not present

## 2022-04-08 MED ORDER — ROSUVASTATIN CALCIUM 10 MG PO TABS: 10.0000 mg | ORAL_TABLET | Freq: Every day | ORAL | 3 refills | Status: DC

## 2022-04-08 NOTE — Progress Notes (Unsigned)
   Subjective:    Patient ID: Jessica Kirby, female    DOB: 10-25-1955, 66 y.o.   MRN: [id number]  HPI Patient is here due to ongoing bilateral leg and ankle swelling Has had a visit w/ cardiology and testing done, has follow up  Considering statin , has questions about it    Review of Systems     Objective:   Physical Exam        Assessment & Plan:

## 2022-04-09 ENCOUNTER — Telehealth: Payer: Self-pay | Admitting: Internal Medicine

## 2022-04-09 ENCOUNTER — Encounter: Payer: Self-pay | Admitting: Nurse Practitioner

## 2022-04-09 DIAGNOSIS — M7989 Other specified soft tissue disorders: Secondary | ICD-10-CM | POA: Diagnosis not present

## 2022-04-09 NOTE — Telephone Encounter (Signed)
Spoke with patient of Dr. Rennis Golden. She reports occasional pain around middle of left breast. She also felt like heart rate was going up a little bit. She asks if she can take xanax to calm her nerves. Advised that given her stress test + symptoms, she needs to go to hospital. She reports she does not drive in [place], so would go to [facility]. Advised this is OK and if transport is needed, this will be taken care of.   Routed to MD as Lorain Childes

## 2022-04-09 NOTE — Telephone Encounter (Signed)
Pt c/o of Chest Pain: STAT if CP now or developed within 24 hours  1. Are you having CP right now? No (on the left side of her chest)  2. Are you experiencing any other symptoms (ex. SOB, nausea, vomiting, sweating)? no  3. How long have you been experiencing CP? Last two days   4. Is your CP continuous or coming and going? Coming and going   5. Have you taken Nitroglycerin? no ?

## 2022-04-10 LAB — D-DIMER, QUANTITATIVE: D-DIMER: 0.53 mg/L FEU — ABNORMAL HIGH (ref 0.00–0.49)

## 2022-04-16 ENCOUNTER — Telehealth (INDEPENDENT_AMBULATORY_CARE_PROVIDER_SITE_OTHER): Payer: Medicare Other | Admitting: Internal Medicine

## 2022-04-16 ENCOUNTER — Encounter: Payer: Self-pay | Admitting: Internal Medicine

## 2022-04-16 VITALS — BP 143/79 | HR 84 | Ht 62.0 in | Wt 190.0 lb

## 2022-04-16 DIAGNOSIS — E785 Hyperlipidemia, unspecified: Secondary | ICD-10-CM | POA: Diagnosis not present

## 2022-04-16 DIAGNOSIS — R9439 Abnormal result of other cardiovascular function study: Secondary | ICD-10-CM | POA: Diagnosis not present

## 2022-04-16 DIAGNOSIS — R9431 Abnormal electrocardiogram [ECG] [EKG]: Secondary | ICD-10-CM

## 2022-04-16 DIAGNOSIS — R079 Chest pain, unspecified: Secondary | ICD-10-CM

## 2022-04-16 MED ORDER — METOPROLOL SUCCINATE ER 25 MG PO TB24
25.0000 mg | ORAL_TABLET | Freq: Every day | ORAL | 1 refills | Status: DC
Start: 2022-04-16 — End: 2022-07-21

## 2022-04-16 NOTE — Patient Instructions (Signed)
Medication Instructions:  Start Metoprolol Xl 25 mg daily. *If you need a refill on your cardiac medications before your next appointment, please call your pharmacy*   Lab Work: None. If you have labs (blood work) drawn today and your tests are completely normal, you will receive your results only by: MyChart Message (if you have MyChart) OR A paper copy in the mail If you have any lab test that is abnormal or we need to change your treatment, we will call you to review the results.   Testing/Procedures: None.   Follow-Up: At [facility], you and your health needs are our priority.  As part of our continuing mission to provide you with exceptional heart care, we have created designated Provider Care Teams.  These Care Teams include your primary Cardiologist (physician) and Advanced Practice Providers (APPs -  Physician Assistants and Nurse Practitioners) who all work together to provide you with the care you need, when you need it.  We recommend signing up for the patient portal called "MyChart".  Sign up information is provided on this After Visit Summary.  MyChart is used to connect with patients for Virtual Visits (Telemedicine).  Patients are able to view lab/test results, encounter notes, upcoming appointments, etc.  Non-urgent messages can be sent to your provider as well.   To learn more about what you can do with MyChart, go to [email or web address].    Your next appointment:   3 month(s)  The format for your next appointment:   In Person  Provider:   Dr. Rennis Golden    Important Information About Sugar

## 2022-04-16 NOTE — Progress Notes (Signed)
Virtual Visit via Video Note   Because of [name] co-morbid illnesses, she is at least at moderate risk for complications without adequate follow up.  This format is felt to be most appropriate for this patient at this time.  All issues noted in this document were discussed and addressed.  A limited physical exam was performed with this format.  Please refer to the patient's chart for her consent to telehealth for [facility].      Date:  04/16/2022   ID:  Jessica Kirby, DOB 10-Feb-1956, MRN [id number] The patient was identified using 2 identifiers.  Evaluation Performed:  Follow-Up Visit  Patient Location:  [address] [place] [address]  Provider location:   [address], [address] [place], [place] [address]  PCP:  Tommie Sams, DO  Cardiologist:  None Electrophysiologist:  None   Chief Complaint: Follow-up stress test  History of Present Illness:    Jessica Kirby is a 66 y.o. female who presents via audio/video conferencing for a telehealth visit today.  This is a pleasant 66 year old female kindly referred for evaluation of heart murmur.  She was seen by primary care provider noted to have a heart murmur but beyond that she reports having some chest discomfort and fatigue with exertion.  She recently has lost a lot of weight and is exercising fairly regularly.  She notes when she exercises however she gets significantly diaphoretic however this is a good 10 to 15 minutes into her exercise.  Generally she does about 3 and a half miles per hour on a flat treadmill.  She does not necessarily get chest discomfort with this but notes that she fatigues easily doing certain activities.  An echocardiogram was ordered to evaluate her murmur prior to this visit.  This demonstrated LVEF 70-75%, no regional wall motion abnormalities and mild LVH.  Diastolic function was considered normal.  No significant valvular disease was noted.  She does report family  history of heart disease.  Other risk factors include type 2 diabetes, obesity with recent significant weight loss, hypertension and hypothyroidism.  04/16/2022  [name] returns today for follow-up of her stress test.  This was performed for worsening shortness of breath and diaphoresis with exercise.  She was also noted to have diffuse T wave inversions which were more notable than they had been previously.  Echo back in May showed hyperdynamic LVEF 7075% with mild LVH.  Myoview stress test indicated mild ischemia.  She was able to exercise for 6 minutes achieving 7 METS.  There was ST depression noted during stress and abnormal perfusion including mild defects in the basal anterior wall that was reversible and the mid to basal anteroseptal and inferoseptal locations.  This suggested LAD territory ischemia.  No transient ischemic dilatation was noted.  I reviewed the findings with her and her daughter today.  Initially I recommended cardiac catheterization however she did not seem interested in this unless it was absolutely necessary.  As this is a mild defect, I think we could consider additional medical therapy first.  The patient does not have symptoms concerning for COVID-19 infection (fever, chills, cough, or new SHORTNESS OF BREATH).    Prior CV studies:   The following studies were reviewed today:  Myoview stress test  PMHx:  Past Medical History:  Diagnosis Date   Allergy    Anxiety    Insomnia   Depression    Insomnia   Diabetes mellitus without complication (HCC) 11/28/2020  GERD (gastroesophageal reflux disease)    Herniated disc    Hypertension    Hypothyroidism    not medicated at this time   Osteoporosis    Thyroid disease    Hypothyroidism    Past Surgical History:  Procedure Laterality Date   ABDOMINAL HYSTERECTOMY     COLONOSCOPY N/A 04/07/2016   Procedure: COLONOSCOPY;  Surgeon: Daneil Dolin, MD;  [facility]: [facility];  Service: Endoscopy;  Laterality:  N/A;  8:30 AM   TUBAL LIGATION     WRIST SURGERY      FAMHx:  Family History  Problem Relation Age of Onset   Diabetes Father    Heart attack Mother    Heart disease Mother    Bladder Cancer Other    Colon cancer Neg Hx     SOCHx:   reports that she has never smoked. She has been exposed to tobacco smoke. She has never used smokeless tobacco. She reports that she does not drink alcohol and does not use drugs.  ALLERGIES:  Allergies  Allergen Reactions   Fosamax [Alendronate Sodium] Other (See Comments)    GI Headache   Vioxx [Rofecoxib]     Per patient   Losartan Cough   Sulfa Antibiotics Hives    MEDS:  Current Meds  Medication Sig   metoprolol succinate (TOPROL-XL) 25 MG 24 hr tablet Take 1 tablet (25 mg total) by mouth daily.     ROS: Pertinent items noted in HPI and remainder of comprehensive ROS otherwise negative.  Labs/Other Tests and Data Reviewed:    Recent Labs: 02/03/2022: ALT 18; B Natriuretic Peptide 16.0; Hemoglobin 13.1; Platelets 276 04/01/2022: BUN 12; Creatinine, Ser 0.67; Potassium 3.6; Sodium 142   Recent Lipid Panel Lab Results  Component Value Date/Time   CHOL 192 04/01/2022 08:43 AM   TRIG 113 04/01/2022 08:43 AM   HDL 52 04/01/2022 08:43 AM   CHOLHDL 3.7 04/01/2022 08:43 AM   CHOLHDL 3.9 06/25/2014 07:49 AM   LDLCALC 120 (H) 04/01/2022 08:43 AM    Wt Readings from Last 3 Encounters:  04/16/22 190 lb (86.2 kg)  04/08/22 193 lb (87.5 kg)  03/26/22 193 lb (87.5 kg)     Exam:    Vital Signs:  BP (!) 143/79   Pulse 84   Ht 5\' 2"  (1.575 m)   Wt 190 lb (86.2 kg)   SpO2 99%   BMI 34.75 kg/m    General appearance: alert and no distress Lungs: No visual respiratory difficulty Abdomen: Overweight Extremities: extremities normal, atraumatic, no cyanosis or edema Neurologic: Grossly normal  ASSESSMENT & PLAN:    Chest pressure and fatigue with exertion Probable flow murmur Abnormal EKG Family history of heart disease Type 2  diabetes Hypertension Obesity with recent weight loss  [name] was noted to have a mildly abnormal Myoview stress test suggesting of LAD territory ischemia.  She has had some T wave inversions and was noted to have some ST depression with exercise.  Symptoms include some fatigue with exertion and chest pressure which is not exercise limiting.  She is also noted some excessive diaphoresis.  She would like to trial medical therapy first.  I think this is reasonable since this is considered a mild perfusion abnormality.  Would recommend starting Toprol-XL 25 mg daily in addition to her current regimen.  If her symptoms persist, we could consider adding a long-acting nitrate.  Ultimately if her symptoms worsen with exercise, we may need to consider cardiac catheterization.  I  advised her to present to the emergency department if she develops symptoms such as chest pain or severe shortness of breath that do not resolve with rest.   COVID-19 Education: The signs and symptoms of COVID-19 were discussed with the patient and how to seek care for testing (follow up with PCP or arrange E-visit).  The importance of social distancing was discussed today.  Patient Risk:   After full review of this patients clinical status, I feel that they are at least moderate risk at this time.  Time:   Today, I have spent 15 minutes with the patient with telehealth technology discussing stress test results.     Medication Adjustments/Labs and Tests Ordered: Current medicines are reviewed at length with the patient today.  Concerns regarding medicines are outlined above.   Tests Ordered: No orders of the defined types were placed in this encounter.   Medication Changes: Meds ordered this encounter  Medications   metoprolol succinate (TOPROL-XL) 25 MG 24 hr tablet    Sig: Take 1 tablet (25 mg total) by mouth daily.    Dispense:  90 tablet    Refill:  1    Disposition:  in 3 month(s)  Chrystie Nose, MD,  [facility], FACP  [place]  [facility]  Medical Director of the Advanced Lipid Disorders &  Cardiovascular Risk Reduction Clinic Diplomate of the American Board of Clinical Lipidology Attending Cardiologist  Direct Dial: [phone]  Fax: [phone]  Website:  [email or web address]  Chrystie Nose, MD  04/16/2022 9:27 AM

## 2022-04-27 ENCOUNTER — Ambulatory Visit (INDEPENDENT_AMBULATORY_CARE_PROVIDER_SITE_OTHER): Payer: Medicare Other | Admitting: Nurse Practitioner

## 2022-04-27 VITALS — BP 132/82 | Ht 62.0 in | Wt 196.4 lb

## 2022-04-27 DIAGNOSIS — R5383 Other fatigue: Secondary | ICD-10-CM

## 2022-04-27 DIAGNOSIS — R0789 Other chest pain: Secondary | ICD-10-CM | POA: Diagnosis not present

## 2022-04-27 NOTE — Progress Notes (Signed)
Subjective:    Patient ID: Jessica Kirby, female    DOB: July 11, 1956, 66 y.o.   MRN: [id number]  HPI  Patient arrives for a follow up on blood pressure. Patient states her cardiologist added Metoprolol 25 qd and she has been feeling tired on new med and wanted to check her blood pressure.  Patient states that she recently met with the cardiologist on 7/20.  Patient was notified at that time that her EKG and her stress test both showed cardiac ischemia.  The cardiologist recommended a cardiac cath however the patient states that she was apprehensive about that at that time.  Patient states that cardiologist and patient through shared decision making decided to start metoprolol 25 mg daily.  However patient is unsure of how metoprolol 25 mg is supposed to help with her cardiac health.  Patient denies any recent shortness of breath, difficulty breathing, chest pain, chest pressure, arm pain, jaw pain.  Patient does admit to feeling very tired since being on metoprolol.  Patient also states that she feels like she gets more tired when exercising.  Review of Systems  Constitutional:  Positive for fatigue.  All other systems reviewed and are negative.      Objective:   Physical Exam Vitals reviewed.  Constitutional:      General: She is not in acute distress.    Appearance: Normal appearance. She is normal weight. She is not ill-appearing, toxic-appearing or diaphoretic.  HENT:     Head: Normocephalic and atraumatic.  Cardiovascular:     Rate and Rhythm: Normal rate and regular rhythm.     Pulses: Normal pulses.     Heart sounds: Murmur heard.  Pulmonary:     Effort: Pulmonary effort is normal. No respiratory distress.     Breath sounds: Normal breath sounds. No wheezing.  Musculoskeletal:     Right lower leg: Edema present.     Left lower leg: Edema present.     Comments: Grossly intact  Skin:    General: Skin is warm.     Capillary Refill: Capillary refill takes less than 2  seconds.  Neurological:     Mental Status: She is alert.     Comments: Grossly intact  Psychiatric:        Mood and Affect: Mood normal.        Behavior: Behavior normal.         Assessment & Plan:   1. Chest pressure -Patient likely has cardiac ischemia based on EKG findings and stress test findings. -Patient started on metoprolol 25 mg daily. -Patient to continue metoprolol 25 mg daily to decrease cardiac workload. -Pulse today was in the 70s.  Do not have concerns of beta-blocker induced bradycardia at this time -Patient instructed to continue to monitor pulse and recognize signs of bradycardia -Patient states that she is willing to pursue cardiac catheterization -Go to the emergency room if she experiences any chest pain, increased shortness of breath, or any unwell feeling. -We will contact Dr. Debara Pickett with cardiology to inquire about getting patient scheduled for cardiac cath -Follow-up in 3 months  2. Fatigue, unspecified type -Likely cardiology etiology -Spent detailed time discussing hemodynamic today's etiology of heart and coronary arteries. -Go to emergency room if you experience any increased shortness of breath or increased fatigue -We will contact Dr. Berdine Addison to cardiology to inquire about getting patient scheduled for cardiac cath -Follow-up in 3 months    Note:  This document was prepared using Dragon voice recognition software  and may include unintentional dictation errors. Note - This record has been created using [employer].  Chart creation errors have been sought, but may not always  have been located. Such creation errors do not reflect on  the standard of medical care.

## 2022-04-28 ENCOUNTER — Encounter: Payer: Self-pay | Admitting: Nurse Practitioner

## 2022-04-28 DIAGNOSIS — M79672 Pain in left foot: Secondary | ICD-10-CM | POA: Diagnosis not present

## 2022-04-28 DIAGNOSIS — M79674 Pain in right toe(s): Secondary | ICD-10-CM | POA: Diagnosis not present

## 2022-04-28 DIAGNOSIS — E1140 Type 2 diabetes mellitus with diabetic neuropathy, unspecified: Secondary | ICD-10-CM | POA: Diagnosis not present

## 2022-04-28 DIAGNOSIS — M79675 Pain in left toe(s): Secondary | ICD-10-CM | POA: Diagnosis not present

## 2022-04-28 DIAGNOSIS — L110 Acquired keratosis follicularis: Secondary | ICD-10-CM | POA: Diagnosis not present

## 2022-04-28 DIAGNOSIS — M79671 Pain in right foot: Secondary | ICD-10-CM | POA: Diagnosis not present

## 2022-05-08 ENCOUNTER — Telehealth: Payer: Self-pay

## 2022-05-08 DIAGNOSIS — M9902 Segmental and somatic dysfunction of thoracic region: Secondary | ICD-10-CM | POA: Diagnosis not present

## 2022-05-08 DIAGNOSIS — E119 Type 2 diabetes mellitus without complications: Secondary | ICD-10-CM

## 2022-05-08 DIAGNOSIS — M542 Cervicalgia: Secondary | ICD-10-CM | POA: Diagnosis not present

## 2022-05-08 DIAGNOSIS — M546 Pain in thoracic spine: Secondary | ICD-10-CM | POA: Diagnosis not present

## 2022-05-08 DIAGNOSIS — M9901 Segmental and somatic dysfunction of cervical region: Secondary | ICD-10-CM | POA: Diagnosis not present

## 2022-05-08 MED ORDER — ACCU-CHEK SOFTCLIX LANCETS MISC: 7 refills | Status: DC

## 2022-05-08 MED ORDER — GLUCOSE BLOOD VI STRP: ORAL_STRIP | 7 refills | Status: DC

## 2022-05-08 NOTE — Telephone Encounter (Signed)
Called patient verified brand name , has been sent to pharmacy.

## 2022-05-08 NOTE — Telephone Encounter (Signed)
Encourage patient to contact the pharmacy for refills or they can request refills through [facility]  (Please schedule appointment if patient has not been seen in over a year)    WHAT PHARMACY WOULD THEY LIKE THIS SENT TO: [id number] - [place], [place] - [address]   MEDICATION NAME & DOSE:Pt needs supplies for needles and strips for glucose reader  NOTES/COMMENTS FROM PATIENT:      Front office please notify patient: It takes 48-72 hours to process rx refill requests Ask patient to call pharmacy to ensure rx is ready before heading there.

## 2022-06-08 ENCOUNTER — Other Ambulatory Visit: Payer: Self-pay | Admitting: Family Medicine

## 2022-06-08 ENCOUNTER — Telehealth: Payer: Self-pay

## 2022-06-08 DIAGNOSIS — R531 Weakness: Secondary | ICD-10-CM

## 2022-06-08 NOTE — Telephone Encounter (Signed)
Caller name:Gavin Schlie   On DPR? :Yes  Call back number:[phone]  Provider they see: Adriana Simas   Reason for call:Pt is worried her potassium is getting to low she is feeling weak and tired and wants to know if Dr Adriana Simas can order blood work? Pt has upcoming appt 06/25/22

## 2022-06-08 NOTE — Telephone Encounter (Signed)
Jessica Sams, DO     I ordered labs as she requested.

## 2022-06-08 NOTE — Telephone Encounter (Signed)
Left message to return call 

## 2022-06-10 DIAGNOSIS — R531 Weakness: Secondary | ICD-10-CM | POA: Diagnosis not present

## 2022-06-10 NOTE — Telephone Encounter (Signed)
Patient notified

## 2022-06-11 LAB — CMP14+EGFR
ALT: 17 IU/L (ref 0–32)
AST: 16 IU/L (ref 0–40)
Albumin/Globulin Ratio: 2.2 (ref 1.2–2.2)
Albumin: 4.6 g/dL (ref 3.9–4.9)
Alkaline Phosphatase: 68 IU/L (ref 44–121)
BUN/Creatinine Ratio: 19 (ref 12–28)
BUN: 14 mg/dL (ref 8–27)
Bilirubin Total: 0.8 mg/dL (ref 0.0–1.2)
CO2: 26 mmol/L (ref 20–29)
Calcium: 9.5 mg/dL (ref 8.7–10.3)
Chloride: 102 mmol/L (ref 96–106)
Creatinine, Ser: 0.72 mg/dL (ref 0.57–1.00)
Globulin, Total: 2.1 g/dL (ref 1.5–4.5)
Glucose: 88 mg/dL (ref 70–99)
Potassium: 4.5 mmol/L (ref 3.5–5.2)
Sodium: 144 mmol/L (ref 134–144)
Total Protein: 6.7 g/dL (ref 6.0–8.5)
eGFR: 91 mL/min/{1.73_m2} (ref >59)

## 2022-06-11 LAB — CBC
Hematocrit: 40.5 % (ref 34.0–46.6)
Hemoglobin: 13.6 g/dL (ref 11.1–15.9)
MCH: 29.4 pg (ref 26.6–33.0)
MCHC: 33.6 g/dL (ref 31.5–35.7)
MCV: 88 fL (ref 79–97)
Platelets: 262 10*3/uL (ref 150–450)
RBC: 4.63 x10E6/uL (ref 3.77–5.28)
RDW: 11.9 % (ref 11.7–15.4)
WBC: 10.4 10*3/uL (ref 3.4–10.8)

## 2022-06-11 LAB — TSH: TSH: 2.82 u[IU]/mL (ref 0.450–4.500)

## 2022-06-25 ENCOUNTER — Encounter: Payer: Self-pay | Admitting: Family Medicine

## 2022-06-25 ENCOUNTER — Ambulatory Visit (INDEPENDENT_AMBULATORY_CARE_PROVIDER_SITE_OTHER): Payer: Medicare Other | Admitting: Family Medicine

## 2022-06-25 VITALS — BP 120/79 | HR 77 | Temp 96.6°F | Wt 201.0 lb

## 2022-06-25 DIAGNOSIS — Z23 Encounter for immunization: Secondary | ICD-10-CM | POA: Diagnosis not present

## 2022-06-25 DIAGNOSIS — E785 Hyperlipidemia, unspecified: Secondary | ICD-10-CM | POA: Diagnosis not present

## 2022-06-25 DIAGNOSIS — R9439 Abnormal result of other cardiovascular function study: Secondary | ICD-10-CM | POA: Insufficient documentation

## 2022-06-25 DIAGNOSIS — I10 Essential (primary) hypertension: Secondary | ICD-10-CM | POA: Diagnosis not present

## 2022-06-25 NOTE — Progress Notes (Signed)
Subjective:  Patient ID: Jessica Kirby, female    DOB: 1956-09-08  Age: 66 y.o. MRN: [id number]  CC: Chief Complaint  Patient presents with   Diabetes    Pt states sugars have been running around the low 100s. Has been feeling tired.     HPI:  66 year old female with HTN, GERD, DM-2, Osteopenia, Anxiety, HLD presents for follow-up.  Patient's hypertension is well controlled.  She is currently on metoprolol, HCTZ, and amlodipine.  Patient has been seen by cardiology in June and then subsequently had a follow-up video visit in August.  Has had an abnormal stress test.  Reports that she feels fatigued.  She is particular fatigue after exertion.  Patient states that she walks on a treadmill and gets quite diaphoretic.  Occasionally has some brief chest pain.  No shortness of breath.  Seems to be more fatigued since starting the beta-blocker.  Patient is on Crestor for hyperlipidemia.  Tolerating.  Patient Active Problem List   Diagnosis Date Noted   Abnormal stress test 06/25/2022   Hyperlipidemia 03/25/2022   Diabetes mellitus without complication ([place]) [id number]   Anxiety 11/04/2015   GERD (gastroesophageal reflux disease) 09/27/2013   Hypertension    Osteopenia     Social Hx   Social History   Socioeconomic History   Marital status: Widowed    Spouse name: Not on file   Number of children: Not on file   Years of education: Not on file   Highest education level: Not on file  Occupational History   Not on file  Tobacco Use   Smoking status: Never    Passive exposure: Yes   Smokeless tobacco: Never  Vaping Use   Vaping Use: Never used  Substance and Sexual Activity   Alcohol use: No    Alcohol/week: 0.0 standard drinks of alcohol   Drug use: No   Sexual activity: Yes  Other Topics Concern   Not on file  Social History Narrative   Not on file   Social Determinants of Health   Financial Resource Strain: Not on file  Food Insecurity: Not on file   Transportation Needs: Not on file  Physical Activity: Not on file  Stress: Not on file  Social Connections: Not on file    Review of Systems Per HPI  Objective:  BP 120/79   Pulse 77   Temp (!) 96.6 F (35.9 C)   Wt 201 lb (91.2 kg)   SpO2 97%   BMI 36.76 kg/m      06/25/2022    9:05 AM 04/27/2022    2:33 PM 04/16/2022    8:38 AM  BP/Weight  Systolic BP [id number]  Diastolic BP 79 82 79  Wt. (Lbs) 201 196.4 190  BMI 36.76 kg/m2 35.92 kg/m2 34.75 kg/m2    Physical Exam Vitals and nursing note reviewed.  Constitutional:      Appearance: Normal appearance.  HENT:     Head: Normocephalic and atraumatic.  Eyes:     General:        Right eye: No discharge.        Left eye: No discharge.     Conjunctiva/sclera: Conjunctivae normal.  Cardiovascular:     Rate and Rhythm: Normal rate and regular rhythm.  Pulmonary:     Effort: Pulmonary effort is normal.     Breath sounds: Normal breath sounds. No wheezing, rhonchi or rales.  Neurological:     Mental Status: She is alert.  Psychiatric:  Mood and Affect: Mood normal.        Behavior: Behavior normal.     Lab Results  Component Value Date   WBC 10.4 06/10/2022   HGB 13.6 06/10/2022   HCT 40.5 06/10/2022   PLT 262 06/10/2022   GLUCOSE 88 06/10/2022   CHOL 192 04/01/2022   TRIG 113 04/01/2022   HDL 52 04/01/2022   LDLCALC 120 (H) 04/01/2022   ALT 17 06/10/2022   AST 16 06/10/2022   NA 144 06/10/2022   K 4.5 06/10/2022   CL 102 06/10/2022   CREATININE 0.72 06/10/2022   BUN 14 06/10/2022   CO2 26 06/10/2022   TSH 2.820 06/10/2022   INR 0.9 02/03/2022   HGBA1C 5.8 (H) 04/01/2022     Assessment & Plan:   Problem List Items Addressed This Visit       Cardiovascular and Mediastinum   Hypertension    BP well controlled.  Continue metoprolol, amlodipine, and HCTZ.        Other   Abnormal stress test    Spoke with cardiologist today regarding patient's symptoms.  He has previously discussed  the possibility of catheterization.  I believe that she needs cardiac catheterization given her persistent symptoms.  Advised the patient to take it easy.  No significant exertion.  Continue current medications.  Cardiology has reached out to her and has placed her on the upcoming schedule.      Hyperlipidemia    Patient is tolerating statin.  Continue.      Other Visit Diagnoses     Need for vaccination    -  Primary   Relevant Orders   Flu Vaccine QUAD High Dose(Fluad) (Completed)      Follow-up:  Return in about 6 months (around 12/24/2022).  [place]

## 2022-06-25 NOTE — Assessment & Plan Note (Signed)
BP well controlled.  Continue metoprolol, amlodipine, and HCTZ.

## 2022-06-25 NOTE — Assessment & Plan Note (Signed)
Spoke with cardiologist today regarding patient's symptoms.  He has previously discussed the possibility of catheterization.  I believe that she needs cardiac catheterization given her persistent symptoms.  Advised the patient to take it easy.  No significant exertion.  Continue current medications.  Cardiology has reached out to her and has placed her on the upcoming schedule.

## 2022-06-25 NOTE — Assessment & Plan Note (Signed)
Patient is tolerating statin.  Continue.

## 2022-06-25 NOTE — Patient Instructions (Signed)
Continue your current medications.  Please take it easy for now.  I will reach out to Dr. Debara Pickett.  Take care  Dr. Lacinda Axon

## 2022-06-26 ENCOUNTER — Encounter: Payer: Self-pay | Admitting: Physician Assistant

## 2022-06-26 ENCOUNTER — Ambulatory Visit: Payer: Medicare Other | Attending: Physician Assistant | Admitting: Physician Assistant

## 2022-06-26 VITALS — BP 130/70 | HR 75 | Ht 62.0 in | Wt 200.6 lb

## 2022-06-26 DIAGNOSIS — I10 Essential (primary) hypertension: Secondary | ICD-10-CM | POA: Diagnosis not present

## 2022-06-26 DIAGNOSIS — R079 Chest pain, unspecified: Secondary | ICD-10-CM | POA: Diagnosis not present

## 2022-06-26 NOTE — Patient Instructions (Signed)
Medication Instructions:  Your physician recommends that you continue on your current medications as directed. Please refer to the Current Medication list given to you today.   *If you need a refill on your cardiac medications before your next appointment, please call your pharmacy*   Lab Work: NONE ordered at this time of appointment (Already had lab work for cath)  If you have labs (blood work) drawn today and your tests are completely normal, you will receive your results only by: [place] (if you have MyChart) OR A paper copy in the mail If you have any lab test that is abnormal or we need to change your treatment, we will call you to review the results.   Testing/Procedures: Your physician has requested that you have a cardiac catheterization. Cardiac catheterization is used to diagnose and/or treat various heart conditions. Doctors may recommend this procedure for a number of different reasons. The most common reason is to evaluate chest pain. Chest pain can be a symptom of coronary artery disease (CAD), and cardiac catheterization can show whether plaque is narrowing or blocking your heart's arteries. This procedure is also used to evaluate the valves, as well as measure the blood flow and oxygen levels in different parts of your heart. For further information please visit [email or web address].[email or web address]. Please follow instruction sheet, as given.   [place] A DEPT OF [employer] [place] A DEPT OF [place]. [facility] [place] [facility] [id number] [place] [address] Dept: [phone] Loc: [place]  06/26/2022  You are scheduled for a Cardiac Catheterization on Wednesday, October 4 with Dr. Shelva Majestic.  1. Please arrive at the [facility] at [facility]: [address] [place], [place] [address] at 8:30 AM (This time is two hours before your procedure to  ensure your preparation). Free valet parking service is available.   Special note: Every effort is made to have your procedure done on time. Please understand that emergencies sometimes delay scheduled procedures.  2. Diet: Do not eat solid foods after midnight.  The patient may have clear liquids until 5am upon the day of the procedure.  3. Labs: Already Completed.  4. Medication instructions in preparation for your procedure:   Contrast Allergy: No    Current Outpatient Medications (Cardiovascular):    amLODipine (NORVASC) 10 MG tablet, TAKE 1 TABLET(10 MG) BY MOUTH DAILY   hydrochlorothiazide (HYDRODIURIL) 25 MG tablet, Take 1 tablet (25 mg total) by mouth daily.   metoprolol succinate (TOPROL-XL) 25 MG 24 hr tablet, Take 1 tablet (25 mg total) by mouth daily.   rosuvastatin (CRESTOR) 10 MG tablet, Take 1 tablet (10 mg total) by mouth daily.  Current Outpatient Medications (Respiratory):    levocetirizine (XYZAL) 5 MG tablet, Take 5 mg by mouth every evening.   triamcinolone (NASACORT) 55 MCG/ACT AERO nasal inhaler, Place 2 sprays into the nose daily. As needed per patient.  Current Outpatient Medications (Analgesics):    aspirin EC 81 MG tablet, Take 81 mg by mouth daily.    Current Outpatient Medications (Other):    Accu-Chek Softclix Lancets lancets, Use to check blood sugars daily before meals E11.9   ALPRAZolam (XANAX) 0.5 MG tablet, Take 1 tablet (0.5 mg total) by mouth daily as needed for anxiety.   blood glucose meter kit and supplies, Dispense based on patient and insurance preference. Use to test glucose twice daily  (FOR ICD-10,  E11.9).  Calcium Carbonate-Vitamin D (CALCIUM-VITAMIN D3 PO), Take 1 capsule by mouth 2 (two) times daily.   glucose blood test strip, Use to check blood sugars daily before meals E11.9   Multiple Vitamins-Minerals (CENTRUM SILVER PO), Take 1 tablet by mouth daily.   pantoprazole (PROTONIX) 20 MG tablet, Take 1 tablet (20 mg total) by mouth  daily. *For reference purposes while preparing patient instructions.   Delete this med list prior to printing instructions for patient.*    HOLD HYDROCHLOROTHIAZIDE THE MORNING OF PROCEDURE     On the morning of your procedure, take your Aspirin 81 mg and any morning medicines NOT listed above.  You may use sips of water.  5. Plan for one night stay--bring personal belongings. 6. Bring a current list of your medications and current insurance cards. 7. You MUST have a responsible person to drive you home. 8. Someone MUST be with you the first 24 hours after you arrive home or your discharge will be delayed. 9. Please wear clothes that are easy to get on and off and wear slip-on shoes.  Thank you for allowing [place] to care for you!   -- [place] [facility]      Follow-Up: At [facility], you and your health needs are our priority.  As part of our continuing mission to provide you with exceptional heart care, we have created designated Provider Care Teams.  These Care Teams include your primary Cardiologist (physician) and Advanced Practice Providers (APPs -  Physician Assistants and Nurse Practitioners) who all work together to provide you with the care you need, when you need it.  We recommend signing up for the patient portal called "MyChart".  Sign up information is provided on this After Visit Summary.  MyChart is used to connect with patients for Virtual Visits (Telemedicine).  Patients are able to view lab/test results, encounter notes, upcoming appointments, etc.  Non-urgent messages can be sent to your provider as well.   To learn more about what you can do with MyChart, go to [email or web address].    Your next appointment:   4 week(s)  The format for your next appointment:   In Person  Provider:   Almyra Deforest, PA-C        Other Instructions   Important Information About Sugar

## 2022-06-26 NOTE — Progress Notes (Unsigned)
Cardiology Office Note:    Date:  06/27/2022   ID:  Jessica Kirby, DOB 1956/06/29, MRN [id number]  PCP:  Coral Spikes, DO   [place] [facility] Cardiologist:  Pixie Casino, MD     Referring MD: Coral Spikes, DO   Chief Complaint  Patient presents with   Chest Pain    Worsening dyspnea. Seen for Dr. Debara Pickett    History of Present Illness:    Jessica Kirby is a 66 y.o. female with a hx of GERD, hypertension, hypothyroidism and history of DM2.  Patient was initially referred to cardiology service for evaluation of heart murmur.  Subsequent echocardiogram showed EF of 70 to 75%, no regional wall motion abnormality, mild LVH.  Normal diastolic function.  No significant valve issue.  She reported family history of heart disease.  Patient was last seen by Dr. Debara Pickett on 03/26/2022 at which time her EKG showed T wave inversion in lead III and aVF.  She was also having fatigue with exertion and intermittent chest discomfort, therefore a nuclear stress test was recommended.  Myoview obtained on 04/02/2022 came back intermediate risk with medium defect of mild reduction in uptake present in the apical to basal anterior location which is reversible consistent with mild ischemia, ST depression in V4-V6 noted during stress.  Since the last visit, patient continued to have worsening dyspnea on exertion.  She changed her mind and wish to consider cardiac catheterization.  She does have tightness in the chest when she has significant shortness of breath.  She appears to be euvolemic on physical exam.  We discussed the risk and benefit of the procedure, he was agreeable to proceed.   Past Medical History:  Diagnosis Date   Allergy    Anxiety    Insomnia   Depression    Insomnia   Diabetes mellitus without complication ([place]) 12/03/9022   GERD (gastroesophageal reflux disease)    Herniated disc    Hypertension    Hypothyroidism    not medicated at this time   Osteoporosis    Thyroid  disease    Hypothyroidism    Past Surgical History:  Procedure Laterality Date   ABDOMINAL HYSTERECTOMY     COLONOSCOPY N/A 04/07/2016   Procedure: COLONOSCOPY;  Surgeon: Daneil Dolin, MD;  Location: [facility];  Service: Endoscopy;  Laterality: N/A;  8:30 AM   TUBAL LIGATION     WRIST SURGERY      Current Medications: Current Meds  Medication Sig   Accu-Chek Softclix Lancets lancets Use to check blood sugars daily before meals E11.9   ALPRAZolam (XANAX) 0.5 MG tablet Take 1 tablet (0.5 mg total) by mouth daily as needed for anxiety.   amLODipine (NORVASC) 10 MG tablet TAKE 1 TABLET(10 MG) BY MOUTH DAILY   aspirin EC 81 MG tablet Take 81 mg by mouth daily.    blood glucose meter kit and supplies Dispense based on patient and insurance preference. Use to test glucose twice daily  (FOR ICD-10,  E11.9).   Calcium Carbonate-Vitamin D (CALCIUM-VITAMIN D3 PO) Take 1 capsule by mouth 2 (two) times daily.   glucose blood test strip Use to check blood sugars daily before meals E11.9   hydrochlorothiazide (HYDRODIURIL) 25 MG tablet Take 1 tablet (25 mg total) by mouth daily.   levocetirizine (XYZAL) 5 MG tablet Take 5 mg by mouth every evening.   metoprolol succinate (TOPROL-XL) 25 MG 24 hr tablet Take 1 tablet (25 mg total) by  mouth daily.   Multiple Vitamins-Minerals (CENTRUM SILVER PO) Take 1 tablet by mouth daily.   pantoprazole (PROTONIX) 20 MG tablet Take 1 tablet (20 mg total) by mouth daily.   rosuvastatin (CRESTOR) 10 MG tablet Take 1 tablet (10 mg total) by mouth daily.   triamcinolone (NASACORT) 55 MCG/ACT AERO nasal inhaler Place 2 sprays into the nose daily. As needed per patient.     Allergies:   Fosamax [alendronate sodium], Vioxx [rofecoxib], Losartan, and Sulfa antibiotics   Social History   Socioeconomic History   Marital status: Widowed    Spouse name: Not on file   Number of children: Not on file   Years of education: Not on file   Highest education level: Not  on file  Occupational History   Not on file  Tobacco Use   Smoking status: Never    Passive exposure: Yes   Smokeless tobacco: Never  Vaping Use   Vaping Use: Never used  Substance and Sexual Activity   Alcohol use: No    Alcohol/week: 0.0 standard drinks of alcohol   Drug use: No   Sexual activity: Yes  Other Topics Concern   Not on file  Social History Narrative   Not on file   Social Determinants of Health   Financial Resource Strain: Not on file  Food Insecurity: Not on file  Transportation Needs: Not on file  Physical Activity: Not on file  Stress: Not on file  Social Connections: Not on file     Family History: The patient's family history includes Bladder Cancer in an other family member; Diabetes in her father; Heart attack in her mother; Heart disease in her mother. There is no history of Colon cancer.  ROS:   Please see the history of present illness.     All other systems reviewed and are negative.  EKGs/Labs/Other Studies Reviewed:    The following studies were reviewed today:  Echo 02/12/2022   1. Left ventricular ejection fraction, by estimation, is 70 to 75%. The  left ventricle has hyperdynamic function. The left ventricle has no  regional wall motion abnormalities. There is mild left ventricular  hypertrophy. Left ventricular diastolic  parameters were normal.   2. Right ventricular systolic function is normal. The right ventricular  size is normal.   3. The mitral valve is normal in structure. No evidence of mitral valve  regurgitation.   4. The aortic valve is normal in structure. Aortic valve regurgitation is  not visualized.   EKG:  EKG is ordered today.  The ekg ordered today demonstrates normal sinus rhythm, no significant ST-T wave changes.  Recent Labs: 02/03/2022: B Natriuretic Peptide 16.0 06/10/2022: ALT 17; BUN 14; Creatinine, Ser 0.72; Hemoglobin 13.6; Platelets 262; Potassium 4.5; Sodium 144; TSH 2.820  Recent Lipid Panel     Component Value Date/Time   CHOL 192 04/01/2022 0843   TRIG 113 04/01/2022 0843   HDL 52 04/01/2022 0843   CHOLHDL 3.7 04/01/2022 0843   CHOLHDL 3.9 06/25/2014 0749   VLDL 17 06/25/2014 0749   LDLCALC 120 (H) 04/01/2022 0843     Risk Assessment/Calculations:           Physical Exam:    VS:  BP 130/70   Pulse 75   Ht _0  (1.575 m)   Wt 200 lb 9.6 oz (91 kg)   SpO2 97%   BMI 36.69 kg/m         Wt Readings from Last 3 Encounters:  06/26/22 200 lb  9.6 oz (91 kg)  06/25/22 201 lb (91.2 kg)  04/27/22 196 lb 6.4 oz (89.1 kg)     GEN:  Well nourished, well developed in no acute distress HEENT: Normal NECK: No JVD; No carotid bruits LYMPHATICS: No lymphadenopathy CARDIAC: RRR, no murmurs, rubs, gallops RESPIRATORY:  Clear to auscultation without rales, wheezing or rhonchi  ABDOMEN: Soft, non-tender, non-distended MUSCULOSKELETAL:  No edema; No deformity  SKIN: Warm and dry NEUROLOGIC:  Alert and oriented x 3 PSYCHIATRIC:  Normal affect   ASSESSMENT:    1. Chest pain, unspecified type   2. Primary hypertension    PLAN:    In order of problems listed above:  Chest pain: Recent worsening dyspnea on exertion.  Patient previously underwent Myoview on 04/02/2022 that revealed ST depression in V4-V6 during stress, medium defect of mild reduction in apical and the basal anterior location that was reversible.  Dr. Debara Pickett talked with the patient regarding cardiac catheterization, however patient did not want to proceed at the time.  Since then, she has been having worsening dyspnea on exertion and did not wish to proceed with cardiac catheterization.  Risk and the benefit of the procedure has been discussed with the patient who is agreeable to proceed.  The case was also discussed with DOD Dr. Sallyanne Kuster as well  Hypertension: Blood pressure stable.      Shared Decision Making/Informed Consent The risks [stroke (1 in 1000), death (1 in 1000), kidney failure [usually  temporary] (1 in 500), bleeding (1 in 200), allergic reaction [possibly serious] (1 in 200)], benefits (diagnostic support and management of coronary artery disease) and alternatives of a cardiac catheterization were discussed in detail with Ms. Alire and she is willing to proceed.    Medication Adjustments/Labs and Tests Ordered: Current medicines are reviewed at length with the patient today.  Concerns regarding medicines are outlined above.  Orders Placed This Encounter  Procedures   EKG 12-Lead   No orders of the defined types were placed in this encounter.   Patient Instructions  Medication Instructions:  Your physician recommends that you continue on your current medications as directed. Please refer to the Current Medication list given to you today.   *If you need a refill on your cardiac medications before your next appointment, please call your pharmacy*   Lab Work: NONE ordered at this time of appointment (Already had lab work for cath)  If you have labs (blood work) drawn today and your tests are completely normal, you will receive your results only by: [place] (if you have MyChart) OR A paper copy in the mail If you have any lab test that is abnormal or we need to change your treatment, we will call you to review the results.   Testing/Procedures: Your physician has requested that you have a cardiac catheterization. Cardiac catheterization is used to diagnose and/or treat various heart conditions. Doctors may recommend this procedure for a number of different reasons. The most common reason is to evaluate chest pain. Chest pain can be a symptom of coronary artery disease (CAD), and cardiac catheterization can show whether plaque is narrowing or blocking your heart's arteries. This procedure is also used to evaluate the valves, as well as measure the blood flow and oxygen levels in different parts of your heart. For further information please visit  [email or web address]. Please follow instruction sheet, as given.   [place] [facility] DE[facility] OF [place] [facility] DEPT OF [place].  [facility] [id number] [place] [id number] Dept: [phone] Loc: [place]  06/26/2022  You are scheduled for a Cardiac Catheterization on Wednesday, October 4 with Dr. Shelva Majestic.  1. Please arrive at the [facility] (Main Entrance A) at [facility]: [address] [place], [place] [address] at 8:30 AM (This time is two hours before your procedure to ensure your preparation). Free valet parking service is available.   Special note: Every effort is made to have your procedure done on time. Please understand that emergencies sometimes delay scheduled procedures.  2. Diet: Do not eat solid foods after midnight.  The patient may have clear liquids until 5am upon the day of the procedure.  3. Labs: Already Completed.  4. Medication instructions in preparation for your procedure:   Contrast Allergy: No    Current Outpatient Medications (Cardiovascular):    amLODipine (NORVASC) 10 MG tablet, TAKE 1 TABLET(10 MG) BY MOUTH DAILY   hydrochlorothiazide (HYDRODIURIL) 25 MG tablet, Take 1 tablet (25 mg total) by mouth daily.   metoprolol succinate (TOPROL-XL) 25 MG 24 hr tablet, Take 1 tablet (25 mg total) by mouth daily.   rosuvastatin (CRESTOR) 10 MG tablet, Take 1 tablet (10 mg total) by mouth daily.  Current Outpatient Medications (Respiratory):    levocetirizine (XYZAL) 5 MG tablet, Take 5 mg by mouth every evening.   triamcinolone (NASACORT) 55 MCG/ACT AERO nasal inhaler, Place 2 sprays into the nose daily. As needed per patient.  Current Outpatient Medications (Analgesics):    aspirin EC 81 MG tablet, Take 81 mg by mouth daily.    Current Outpatient Medications (Other):    Accu-Chek Softclix Lancets lancets, Use to check blood  sugars daily before meals E11.9   ALPRAZolam (XANAX) 0.5 MG tablet, Take 1 tablet (0.5 mg total) by mouth daily as needed for anxiety.   blood glucose meter kit and supplies, Dispense based on patient and insurance preference. Use to test glucose twice daily  (FOR ICD-10,  E11.9).   Calcium Carbonate-Vitamin D (CALCIUM-VITAMIN D3 PO), Take 1 capsule by mouth 2 (two) times daily.   glucose blood test strip, Use to check blood sugars daily before meals E11.9   Multiple Vitamins-Minerals (CENTRUM SILVER PO), Take 1 tablet by mouth daily.   pantoprazole (PROTONIX) 20 MG tablet, Take 1 tablet (20 mg total) by mouth daily. *For reference purposes while preparing patient instructions.   Delete this med list prior to printing instructions for patient.*    HOLD HYDROCHLOROTHIAZIDE THE MORNING OF PROCEDURE     On the morning of your procedure, take your Aspirin 81 mg and any morning medicines NOT listed above.  You may use sips of water.  5. Plan for one night stay--bring personal belongings. 6. Bring a current list of your medications and current insurance cards. 7. You MUST have a responsible person to drive you home. 8. Someone MUST be with you the first 24 hours after you arrive home or your discharge will be delayed. 9. Please wear clothes that are easy to get on and off and wear slip-on shoes.  Thank you for allowing [place] to care for you!   -- [facility]      Follow-Up: At [facility], you and your health needs are our priority.  As part of our continuing mission to provide you with exceptional heart care, we have created designated Provider Care Teams.  These Care Teams include your primary  Cardiologist (physician) and Advanced Practice Providers (APPs -  Physician Assistants and Nurse Practitioners) who all work together to provide you with the care you need, when you need it.  We recommend signing up for the patient portal called "MyChart".   Sign up information is provided on this After Visit Summary.  MyChart is used to connect with patients for Virtual Visits (Telemedicine).  Patients are able to view lab/test results, encounter notes, upcoming appointments, etc.  Non-urgent messages can be sent to your provider as well.   To learn more about what you can do with MyChart, go to [email or web address].    Your next appointment:   4 week(s)  The format for your next appointment:   In Person  Provider:   Almyra Deforest, PA-C        Other Instructions   Important Information About Sugar         Hilbert Corrigan, [place]  06/27/2022 11:48 PM    [place]

## 2022-06-26 NOTE — H&P (View-Only) (Signed)
Cardiology Office Note:    Date:  06/27/2022   ID:  Jessica Kirby, DOB 1956/06/29, MRN [id number]  PCP:  Jessica Spikes, DO   [place] [facility] Cardiologist:  Pixie Casino, MD     Referring MD: Jessica Spikes, DO   Chief Complaint  Patient presents with   Chest Pain    Worsening dyspnea. Seen for Dr. Debara Pickett    History of Present Illness:    Jessica Kirby is a 66 y.o. female with a hx of GERD, hypertension, hypothyroidism and history of DM2.  Patient was initially referred to cardiology service for evaluation of heart murmur.  Subsequent echocardiogram showed EF of 70 to 75%, no regional wall motion abnormality, mild LVH.  Normal diastolic function.  No significant valve issue.  She reported family history of heart disease.  Patient was last seen by Dr. Debara Pickett on 03/26/2022 at which time her EKG showed T wave inversion in lead III and aVF.  She was also having fatigue with exertion and intermittent chest discomfort, therefore a nuclear stress test was recommended.  Myoview obtained on 04/02/2022 came back intermediate risk with medium defect of mild reduction in uptake present in the apical to basal anterior location which is reversible consistent with mild ischemia, ST depression in V4-V6 noted during stress.  Since the last visit, patient continued to have worsening dyspnea on exertion.  She changed her mind and wish to consider cardiac catheterization.  She does have tightness in the chest when she has significant shortness of breath.  She appears to be euvolemic on physical exam.  We discussed the risk and benefit of the procedure, he was agreeable to proceed.   Past Medical History:  Diagnosis Date   Allergy    Anxiety    Insomnia   Depression    Insomnia   Diabetes mellitus without complication ([place]) 12/03/9022   GERD (gastroesophageal reflux disease)    Herniated disc    Hypertension    Hypothyroidism    not medicated at this time   Osteoporosis    Thyroid  disease    Hypothyroidism    Past Surgical History:  Procedure Laterality Date   ABDOMINAL HYSTERECTOMY     COLONOSCOPY N/A 04/07/2016   Procedure: COLONOSCOPY;  Surgeon: Daneil Dolin, MD;  Location: [facility];  Service: Endoscopy;  Laterality: N/A;  8:30 AM   TUBAL LIGATION     WRIST SURGERY      Current Medications: Current Meds  Medication Sig   Accu-Chek Softclix Lancets lancets Use to check blood sugars daily before meals E11.9   ALPRAZolam (XANAX) 0.5 MG tablet Take 1 tablet (0.5 mg total) by mouth daily as needed for anxiety.   amLODipine (NORVASC) 10 MG tablet TAKE 1 TABLET(10 MG) BY MOUTH DAILY   aspirin EC 81 MG tablet Take 81 mg by mouth daily.    blood glucose meter kit and supplies Dispense based on patient and insurance preference. Use to test glucose twice daily  (FOR ICD-10,  E11.9).   Calcium Carbonate-Vitamin D (CALCIUM-VITAMIN D3 PO) Take 1 capsule by mouth 2 (two) times daily.   glucose blood test strip Use to check blood sugars daily before meals E11.9   hydrochlorothiazide (HYDRODIURIL) 25 MG tablet Take 1 tablet (25 mg total) by mouth daily.   levocetirizine (XYZAL) 5 MG tablet Take 5 mg by mouth every evening.   metoprolol succinate (TOPROL-XL) 25 MG 24 hr tablet Take 1 tablet (25 mg total) by  mouth daily.   Multiple Vitamins-Minerals (CENTRUM SILVER PO) Take 1 tablet by mouth daily.   pantoprazole (PROTONIX) 20 MG tablet Take 1 tablet (20 mg total) by mouth daily.   rosuvastatin (CRESTOR) 10 MG tablet Take 1 tablet (10 mg total) by mouth daily.   triamcinolone (NASACORT) 55 MCG/ACT AERO nasal inhaler Place 2 sprays into the nose daily. As needed per patient.     Allergies:   Fosamax [alendronate sodium], Vioxx [rofecoxib], Losartan, and Sulfa antibiotics   Social History   Socioeconomic History   Marital status: Widowed    Spouse name: Not on file   Number of children: Not on file   Years of education: Not on file   Highest education level: Not  on file  Occupational History   Not on file  Tobacco Use   Smoking status: Never    Passive exposure: Yes   Smokeless tobacco: Never  Vaping Use   Vaping Use: Never used  Substance and Sexual Activity   Alcohol use: No    Alcohol/week: 0.0 standard drinks of alcohol   Drug use: No   Sexual activity: Yes  Other Topics Concern   Not on file  Social History Narrative   Not on file   Social Determinants of Health   Financial Resource Strain: Not on file  Food Insecurity: Not on file  Transportation Needs: Not on file  Physical Activity: Not on file  Stress: Not on file  Social Connections: Not on file     Family History: The patient's family history includes Bladder Cancer in an other family member; Diabetes in her father; Heart attack in her mother; Heart disease in her mother. There is no history of Colon cancer.  ROS:   Please see the history of present illness.     All other systems reviewed and are negative.  EKGs/Labs/Other Studies Reviewed:    The following studies were reviewed today:  Echo 02/12/2022   1. Left ventricular ejection fraction, by estimation, is 70 to 75%. The  left ventricle has hyperdynamic function. The left ventricle has no  regional wall motion abnormalities. There is mild left ventricular  hypertrophy. Left ventricular diastolic  parameters were normal.   2. Right ventricular systolic function is normal. The right ventricular  size is normal.   3. The mitral valve is normal in structure. No evidence of mitral valve  regurgitation.   4. The aortic valve is normal in structure. Aortic valve regurgitation is  not visualized.   EKG:  EKG is ordered today.  The ekg ordered today demonstrates normal sinus rhythm, no significant ST-T wave changes.  Recent Labs: 02/03/2022: B Natriuretic Peptide 16.0 06/10/2022: ALT 17; BUN 14; Creatinine, Ser 0.72; Hemoglobin 13.6; Platelets 262; Potassium 4.5; Sodium 144; TSH 2.820  Recent Lipid Panel     Component Value Date/Time   CHOL 192 04/01/2022 0843   TRIG 113 04/01/2022 0843   HDL 52 04/01/2022 0843   CHOLHDL 3.7 04/01/2022 0843   CHOLHDL 3.9 06/25/2014 0749   VLDL 17 06/25/2014 0749   LDLCALC 120 (H) 04/01/2022 0843     Risk Assessment/Calculations:           Physical Exam:    VS:  BP 130/70   Pulse 75   Ht _0  (1.575 m)   Wt 200 lb 9.6 oz (91 kg)   SpO2 97%   BMI 36.69 kg/m         Wt Readings from Last 3 Encounters:  06/26/22 200 lb  9.6 oz (91 kg)  06/25/22 201 lb (91.2 kg)  04/27/22 196 lb 6.4 oz (89.1 kg)     GEN:  Well nourished, well developed in no acute distress HEENT: Normal NECK: No JVD; No carotid bruits LYMPHATICS: No lymphadenopathy CARDIAC: RRR, no murmurs, rubs, gallops RESPIRATORY:  Clear to auscultation without rales, wheezing or rhonchi  ABDOMEN: Soft, non-tender, non-distended MUSCULOSKELETAL:  No edema; No deformity  SKIN: Warm and dry NEUROLOGIC:  Alert and oriented x 3 PSYCHIATRIC:  Normal affect   ASSESSMENT:    1. Chest pain, unspecified type   2. Primary hypertension    PLAN:    In order of problems listed above:  Chest pain: Recent worsening dyspnea on exertion.  Patient previously underwent Myoview on 04/02/2022 that revealed ST depression in V4-V6 during stress, medium defect of mild reduction in apical and the basal anterior location that was reversible.  Dr. Debara Pickett talked with the patient regarding cardiac catheterization, however patient did not want to proceed at the time.  Since then, she has been having worsening dyspnea on exertion and did not wish to proceed with cardiac catheterization.  Risk and the benefit of the procedure has been discussed with the patient who is agreeable to proceed.  The case was also discussed with DOD Dr. Sallyanne Kuster as well  Hypertension: Blood pressure stable.      Shared Decision Making/Informed Consent The risks [stroke (1 in 1000), death (1 in 1000), kidney failure [usually  temporary] (1 in 500), bleeding (1 in 200), allergic reaction [possibly serious] (1 in 200)], benefits (diagnostic support and management of coronary artery disease) and alternatives of a cardiac catheterization were discussed in detail with Ms. Alire and she is willing to proceed.    Medication Adjustments/Labs and Tests Ordered: Current medicines are reviewed at length with the patient today.  Concerns regarding medicines are outlined above.  Orders Placed This Encounter  Procedures   EKG 12-Lead   No orders of the defined types were placed in this encounter.   Patient Instructions  Medication Instructions:  Your physician recommends that you continue on your current medications as directed. Please refer to the Current Medication list given to you today.   *If you need a refill on your cardiac medications before your next appointment, please call your pharmacy*   Lab Work: NONE ordered at this time of appointment (Already had lab work for cath)  If you have labs (blood work) drawn today and your tests are completely normal, you will receive your results only by: [facility] [place] (if you have MyChart) OR A paper copy in the mail If you have any lab test that is abnormal or we need to change your treatment, we will call you to review the results.   Testing/Procedures: Your physician has requested that you have a cardiac catheterization. Cardiac catheterization is used to diagnose and/or treat various heart conditions. Doctors may recommend this procedure for a number of different reasons. The most common reason is to evaluate chest pain. Chest pain can be a symptom of coronary artery disease (CAD), and cardiac catheterization can show whether plaque is narrowing or blocking your heart's arteries. This procedure is also used to evaluate the valves, as well as measure the blood flow and oxygen levels in different parts of your heart. For further information please visit  [email or web address]. Please follow instruction sheet, as given.   [place] A DEPT OF [place] A DEPT OF [place].  [facility] [id number] [place] [id number] Dept: [phone] Loc: [place]  06/26/2022  You are scheduled for a Cardiac Catheterization on Wednesday, October 4 with Dr. Shelva Majestic.  1. Please arrive at the [facility] (Main Entrance A) at [facility]: [address] [place], [place] [address] at 8:30 AM (This time is two hours before your procedure to ensure your preparation). Free valet parking service is available.   Special note: Every effort is made to have your procedure done on time. Please understand that emergencies sometimes delay scheduled procedures.  2. Diet: Do not eat solid foods after midnight.  The patient may have clear liquids until 5am upon the day of the procedure.  3. Labs: Already Completed.  4. Medication instructions in preparation for your procedure:   Contrast Allergy: No    Current Outpatient Medications (Cardiovascular):    amLODipine (NORVASC) 10 MG tablet, TAKE 1 TABLET(10 MG) BY MOUTH DAILY   hydrochlorothiazide (HYDRODIURIL) 25 MG tablet, Take 1 tablet (25 mg total) by mouth daily.   metoprolol succinate (TOPROL-XL) 25 MG 24 hr tablet, Take 1 tablet (25 mg total) by mouth daily.   rosuvastatin (CRESTOR) 10 MG tablet, Take 1 tablet (10 mg total) by mouth daily.  Current Outpatient Medications (Respiratory):    levocetirizine (XYZAL) 5 MG tablet, Take 5 mg by mouth every evening.   triamcinolone (NASACORT) 55 MCG/ACT AERO nasal inhaler, Place 2 sprays into the nose daily. As needed per patient.  Current Outpatient Medications (Analgesics):    aspirin EC 81 MG tablet, Take 81 mg by mouth daily.    Current Outpatient Medications (Other):    Accu-Chek Softclix Lancets lancets, Use to check blood  sugars daily before meals E11.9   ALPRAZolam (XANAX) 0.5 MG tablet, Take 1 tablet (0.5 mg total) by mouth daily as needed for anxiety.   blood glucose meter kit and supplies, Dispense based on patient and insurance preference. Use to test glucose twice daily  (FOR ICD-10,  E11.9).   Calcium Carbonate-Vitamin D (CALCIUM-VITAMIN D3 PO), Take 1 capsule by mouth 2 (two) times daily.   glucose blood test strip, Use to check blood sugars daily before meals E11.9   Multiple Vitamins-Minerals (CENTRUM SILVER PO), Take 1 tablet by mouth daily.   pantoprazole (PROTONIX) 20 MG tablet, Take 1 tablet (20 mg total) by mouth daily. *For reference purposes while preparing patient instructions.   Delete this med list prior to printing instructions for patient.*    HOLD HYDROCHLOROTHIAZIDE THE MORNING OF PROCEDURE     On the morning of your procedure, take your Aspirin 81 mg and any morning medicines NOT listed above.  You may use sips of water.  5. Plan for one night stay--bring personal belongings. 6. Bring a current list of your medications and current insurance cards. 7. You MUST have a responsible person to drive you home. 8. Someone MUST be with you the first 24 hours after you arrive home or your discharge will be delayed. 9. Please wear clothes that are easy to get on and off and wear slip-on shoes.  Thank you for allowing [place] to care for you!   -- [facility]      Follow-Up: At [facility], you and your health needs are our priority.  As part of our continuing mission to provide you with exceptional heart care, we have created designated Provider Care Teams.  These Care Teams include your primary  Cardiologist (physician) and Advanced Practice Providers (APPs -  Physician Assistants and Nurse Practitioners) who all work together to provide you with the care you need, when you need it.  We recommend signing up for the patient portal called "MyChart".   Sign up information is provided on this After Visit Summary.  MyChart is used to connect with patients for Virtual Visits (Telemedicine).  Patients are able to view lab/test results, encounter notes, upcoming appointments, etc.  Non-urgent messages can be sent to your provider as well.   To learn more about what you can do with MyChart, go to [email or web address].    Your next appointment:   4 week(s)  The format for your next appointment:   In Person  Provider:   Almyra Deforest, PA-C        Other Instructions   Important Information About Sugar         Hilbert Corrigan, [place]  06/27/2022 11:48 PM    [place]

## 2022-06-26 NOTE — Progress Notes (Signed)
EKG

## 2022-06-27 ENCOUNTER — Encounter: Payer: Self-pay | Admitting: Physician Assistant

## 2022-06-29 ENCOUNTER — Telehealth: Payer: Self-pay | Admitting: *Deleted

## 2022-06-29 NOTE — Telephone Encounter (Signed)
Cardiac Catheterization scheduled at [facility] for: Wednesday July 01, 2022 8:30 AM Arrival time and place: [place] Entrance A at: 6:30 AM  Nothing to eat after midnight prior to procedure, clear liquids until 5 AM day of procedure.  Medication instructions: -Hold:   HCTZ-AM of procedure -Except hold medications usual morning medications can be taken with sips of water including aspirin 81 mg.  Confirmed patient has responsible adult to drive home post procedure and be with patient first 24 hours after arriving home.  Patient reports no new symptoms concerning for COVID-19 in the past 10 days.  Reviewed procedure instructions with patient.

## 2022-06-30 ENCOUNTER — Telehealth: Payer: Self-pay | Admitting: Internal Medicine

## 2022-06-30 NOTE — Telephone Encounter (Signed)
[place] from pre-service is calling to speak with Jessica Kirby in regards to this patients authorization. Please advise. [phone]

## 2022-07-01 ENCOUNTER — Ambulatory Visit (HOSPITAL_COMMUNITY)
Admission: RE | Admit: 2022-07-01 | Discharge: 2022-07-01 | Disposition: A | Discharge status: Home / Self Care | Payer: Medicare Other | Attending: Cardiovascular Disease | Admitting: Cardiovascular Disease

## 2022-07-01 ENCOUNTER — Encounter (HOSPITAL_COMMUNITY)
Admission: RE | Disposition: A | Discharge status: Home / Self Care | Payer: Self-pay | Source: Home / Self Care | Attending: Cardiovascular Disease

## 2022-07-01 ENCOUNTER — Other Ambulatory Visit: Payer: Self-pay

## 2022-07-01 ENCOUNTER — Encounter (HOSPITAL_COMMUNITY): Payer: Self-pay | Admitting: Cardiovascular Disease

## 2022-07-01 DIAGNOSIS — R9439 Abnormal result of other cardiovascular function study: Secondary | ICD-10-CM | POA: Diagnosis not present

## 2022-07-01 DIAGNOSIS — K219 Gastro-esophageal reflux disease without esophagitis: Secondary | ICD-10-CM | POA: Diagnosis not present

## 2022-07-01 DIAGNOSIS — I10 Essential (primary) hypertension: Secondary | ICD-10-CM | POA: Insufficient documentation

## 2022-07-01 DIAGNOSIS — I2510 Atherosclerotic heart disease of native coronary artery without angina pectoris: Secondary | ICD-10-CM | POA: Diagnosis not present

## 2022-07-01 DIAGNOSIS — E119 Type 2 diabetes mellitus without complications: Secondary | ICD-10-CM | POA: Insufficient documentation

## 2022-07-01 DIAGNOSIS — R0609 Other forms of dyspnea: Secondary | ICD-10-CM | POA: Diagnosis not present

## 2022-07-01 DIAGNOSIS — E039 Hypothyroidism, unspecified: Secondary | ICD-10-CM | POA: Insufficient documentation

## 2022-07-01 HISTORY — PX: LEFT HEART CATH AND CORONARY ANGIOGRAPHY: CATH118249

## 2022-07-01 SURGERY — LEFT HEART CATH AND CORONARY ANGIOGRAPHY
Anesthesia: LOCAL

## 2022-07-01 MED ORDER — IOHEXOL 350 MG/ML SOLN
INTRAVENOUS | Status: DC | PRN
  Administered 2022-07-01: 50 mL

## 2022-07-01 MED ORDER — FENTANYL CITRATE (PF) 100 MCG/2ML IJ SOLN
INTRAMUSCULAR | Status: AC
  Filled 2022-07-01: qty 2

## 2022-07-01 MED ORDER — HEPARIN SODIUM (PORCINE) 1000 UNIT/ML IJ SOLN
INTRAMUSCULAR | Status: DC | PRN
  Administered 2022-07-01: 4500 [IU] via INTRAVENOUS

## 2022-07-01 MED ORDER — HEPARIN (PORCINE) IN NACL 1000-0.9 UT/500ML-% IV SOLN
INTRAVENOUS | Status: DC | PRN
  Administered 2022-07-01 (×2): 500 mL

## 2022-07-01 MED ORDER — SODIUM CHLORIDE 0.9 % WEIGHT BASED INFUSION
3.0000 mL/kg/h | INTRAVENOUS | Status: AC
  Administered 2022-07-01: 3 mL/kg/h via INTRAVENOUS

## 2022-07-01 MED ORDER — HEPARIN (PORCINE) IN NACL 1000-0.9 UT/500ML-% IV SOLN
INTRAVENOUS | Status: AC
  Filled 2022-07-01: qty 1000

## 2022-07-01 MED ORDER — SODIUM CHLORIDE 0.9 % WEIGHT BASED INFUSION: 1.0000 mL/kg/h | INTRAVENOUS | Status: DC

## 2022-07-01 MED ORDER — MIDAZOLAM HCL 2 MG/2ML IJ SOLN
INTRAMUSCULAR | Status: AC
  Filled 2022-07-01: qty 2

## 2022-07-01 MED ORDER — HYDRALAZINE HCL 20 MG/ML IJ SOLN: 10.0000 mg | INTRAMUSCULAR | Status: DC | PRN

## 2022-07-01 MED ORDER — MIDAZOLAM HCL 2 MG/2ML IJ SOLN
INTRAMUSCULAR | Status: DC | PRN
  Administered 2022-07-01: 2 mg via INTRAVENOUS

## 2022-07-01 MED ORDER — SODIUM CHLORIDE 0.9% FLUSH: 3.0000 mL | Freq: Two times a day (BID) | INTRAVENOUS | Status: DC

## 2022-07-01 MED ORDER — SODIUM CHLORIDE 0.9 % IV SOLN: 250.0000 mL | INTRAVENOUS | Status: DC | PRN

## 2022-07-01 MED ORDER — ASPIRIN 81 MG PO CHEW: 81.0000 mg | CHEWABLE_TABLET | ORAL | Status: DC

## 2022-07-01 MED ORDER — LIDOCAINE HCL (PF) 1 % IJ SOLN
INTRAMUSCULAR | Status: DC | PRN
  Administered 2022-07-01: 2 mL

## 2022-07-01 MED ORDER — ACETAMINOPHEN 325 MG PO TABS: 650.0000 mg | ORAL_TABLET | ORAL | Status: DC | PRN

## 2022-07-01 MED ORDER — VERAPAMIL HCL 2.5 MG/ML IV SOLN
INTRAVENOUS | Status: DC | PRN
  Administered 2022-07-01: 10 mL via INTRA_ARTERIAL

## 2022-07-01 MED ORDER — HEPARIN SODIUM (PORCINE) 1000 UNIT/ML IJ SOLN
INTRAMUSCULAR | Status: AC
  Filled 2022-07-01: qty 10

## 2022-07-01 MED ORDER — VERAPAMIL HCL 2.5 MG/ML IV SOLN
INTRAVENOUS | Status: AC
  Filled 2022-07-01: qty 2

## 2022-07-01 MED ORDER — SODIUM CHLORIDE 0.9% FLUSH: 3.0000 mL | INTRAVENOUS | Status: DC | PRN

## 2022-07-01 MED ORDER — FENTANYL CITRATE (PF) 100 MCG/2ML IJ SOLN
INTRAMUSCULAR | Status: DC | PRN
  Administered 2022-07-01: 25 ug via INTRAVENOUS

## 2022-07-01 MED ORDER — LABETALOL HCL 5 MG/ML IV SOLN: 10.0000 mg | INTRAVENOUS | Status: DC | PRN

## 2022-07-01 MED ORDER — SODIUM CHLORIDE 0.9 % IV SOLN: INTRAVENOUS | Status: DC

## 2022-07-01 MED ORDER — ONDANSETRON HCL 4 MG/2ML IJ SOLN: 4.0000 mg | Freq: Four times a day (QID) | INTRAMUSCULAR | Status: DC | PRN

## 2022-07-01 MED ORDER — LIDOCAINE HCL (PF) 1 % IJ SOLN
INTRAMUSCULAR | Status: AC
  Filled 2022-07-01: qty 30

## 2022-07-01 SURGICAL SUPPLY — 12 items
BAND ZEPHYR COMPRESS 30 LONG (HEMOSTASIS) IMPLANT
CATH INFINITI 5 FR JL3.5 (CATHETERS) IMPLANT
CATH INFINITI JR4 5F (CATHETERS) IMPLANT
CATH OPTITORQUE TIG 4.0 5F (CATHETERS) IMPLANT
GLIDESHEATH SLEND SS 6F .021 (SHEATH) IMPLANT
GUIDEWIRE INQWIRE 1.5J.035X260 (WIRE) IMPLANT
INQWIRE 1.5J .035X260CM (WIRE) ×1
KIT HEART LEFT (KITS) ×1 IMPLANT
PACK CARDIAC CATHETERIZATION (CUSTOM PROCEDURE TRAY) ×1 IMPLANT
SHEATH PROBE COVER 6X72 (BAG) IMPLANT
TRANSDUCER W/STOPCOCK (MISCELLANEOUS) ×1 IMPLANT
TUBING CIL FLEX 10 FLL-RA (TUBING) ×1 IMPLANT

## 2022-07-01 NOTE — Interval H&P Note (Signed)
Cath Lab Visit (complete for each Cath Lab visit)  Clinical Evaluation Leading to the Procedure:   ACS: No.  Non-ACS:    Anginal Classification: CCS II  Anti-ischemic medical therapy: Maximal Therapy (2 or more classes of medications)  Non-Invasive Test Results: Intermediate-risk stress test findings: cardiac mortality 1-3%/year  Prior CABG: No previous CABG      History and Physical Interval Note:  07/01/2022 8:18 AM  Jessica Kirby  has presented today for surgery, with the diagnosis of abnormal nuclear stress test.  The various methods of treatment have been discussed with the patient and family. After consideration of risks, benefits and other options for treatment, the patient has consented to  Procedure(s): LEFT HEART CATH AND CORONARY ANGIOGRAPHY (N/A) as a surgical intervention.  The patient's history has been reviewed, patient examined, no change in status, stable for surgery.  I have reviewed the patient's chart and labs.  Questions were answered to the patient's satisfaction.     Shelva Majestic

## 2022-07-01 NOTE — Progress Notes (Signed)
TR BAND REMOVAL  LOCATION:    right radial  DEFLATED PER PROTOCOL:    Yes.    TIME BAND OFF / DRESSING APPLIED:    1045   SITE UPON ARRIVAL:    Level 0  SITE AFTER BAND REMOVAL:    Level 0  CIRCULATION SENSATION AND MOVEMENT:    Within Normal Limits   Yes.    COMMENTS:   No bleeding noted

## 2022-07-01 NOTE — Progress Notes (Signed)
Patient and son was given discharge instructions. Both verbalized understanding. 

## 2022-07-01 NOTE — Discharge Instructions (Signed)

## 2022-07-02 ENCOUNTER — Telehealth: Payer: Self-pay | Admitting: Student

## 2022-07-02 ENCOUNTER — Ambulatory Visit: Payer: PRIVATE HEALTH INSURANCE

## 2022-07-02 NOTE — Telephone Encounter (Signed)
    Notified by the catheterization lab that the patient was having some numbness along her right thumb following her catheterization yesterday. She presented to the office today for evaluation and her cath site is overall stable with no evidence of a hematoma or pseudoaneurysm. She does have mild bruising along her cath site. She has a good radial pulse and capillary refill. Recommended she limit her activity with her right wrist for the next several days. If she continues to have numbness or paraesthesias, can use a stress ball. Reviewed warning signs to monitor for which would warrant additional evaluation.   Signed, Erma Heritage, PA-C 07/02/2022, 12:34 PM Pager: [phone]

## 2022-07-03 ENCOUNTER — Telehealth: Payer: Self-pay | Admitting: Internal Medicine

## 2022-07-03 NOTE — Telephone Encounter (Signed)
Caller stated they will need retroactive authorization for patient's recent procedure.  Auth# [id number].  Authorization can be back dated to the date of the procedure.

## 2022-07-05 DIAGNOSIS — J069 Acute upper respiratory infection, unspecified: Secondary | ICD-10-CM | POA: Diagnosis not present

## 2022-07-05 DIAGNOSIS — R030 Elevated blood-pressure reading, without diagnosis of hypertension: Secondary | ICD-10-CM | POA: Diagnosis not present

## 2022-07-07 DIAGNOSIS — E1140 Type 2 diabetes mellitus with diabetic neuropathy, unspecified: Secondary | ICD-10-CM | POA: Diagnosis not present

## 2022-07-07 DIAGNOSIS — M79675 Pain in left toe(s): Secondary | ICD-10-CM | POA: Diagnosis not present

## 2022-07-07 DIAGNOSIS — M79671 Pain in right foot: Secondary | ICD-10-CM | POA: Diagnosis not present

## 2022-07-07 DIAGNOSIS — M79672 Pain in left foot: Secondary | ICD-10-CM | POA: Diagnosis not present

## 2022-07-07 DIAGNOSIS — L110 Acquired keratosis follicularis: Secondary | ICD-10-CM | POA: Diagnosis not present

## 2022-07-07 DIAGNOSIS — M79674 Pain in right toe(s): Secondary | ICD-10-CM | POA: Diagnosis not present

## 2022-07-20 ENCOUNTER — Other Ambulatory Visit (HOSPITAL_COMMUNITY): Payer: Self-pay | Admitting: Internal Medicine

## 2022-07-20 ENCOUNTER — Other Ambulatory Visit (HOSPITAL_COMMUNITY): Payer: Self-pay | Admitting: Family Medicine

## 2022-07-20 DIAGNOSIS — Z1231 Encounter for screening mammogram for malignant neoplasm of breast: Secondary | ICD-10-CM

## 2022-07-21 ENCOUNTER — Ambulatory Visit: Payer: Medicare Other | Attending: Physician Assistant | Admitting: Physician Assistant

## 2022-07-21 ENCOUNTER — Encounter: Payer: Self-pay | Admitting: Physician Assistant

## 2022-07-21 ENCOUNTER — Other Ambulatory Visit: Payer: Self-pay

## 2022-07-21 VITALS — BP 140/74 | HR 75 | Ht 62.0 in | Wt 205.0 lb

## 2022-07-21 DIAGNOSIS — R0609 Other forms of dyspnea: Secondary | ICD-10-CM

## 2022-07-21 DIAGNOSIS — I10 Essential (primary) hypertension: Secondary | ICD-10-CM | POA: Diagnosis not present

## 2022-07-21 MED ORDER — METOPROLOL SUCCINATE ER 25 MG PO TB24: 25.0000 mg | ORAL_TABLET | Freq: Every day | ORAL | 3 refills | Status: DC

## 2022-07-21 NOTE — Patient Instructions (Signed)
Medication Instructions:  Your physician recommends that you continue on your current medications as directed. Please refer to the Current Medication list given to you today.  *If you need a refill on your cardiac medications before your next appointment, please call your pharmacy*  Lab Work: NONE ordered at this time of appointment   If you have labs (blood work) drawn today and your tests are completely normal, you will receive your results only by: [place] (if you have MyChart) OR A paper copy in the mail If you have any lab test that is abnormal or we need to change your treatment, we will call you to review the results.  Testing/Procedures: NONE ordered at this time of appointment   Follow-Up: At [facility], you and your health needs are our priority.  As part of our continuing mission to provide you with exceptional heart care, we have created designated Provider Care Teams.  These Care Teams include your primary Cardiologist (physician) and Advanced Practice Providers (APPs -  Physician Assistants and Nurse Practitioners) who all work together to provide you with the care you need, when you need it.   Your next appointment:   As needed if symptoms worsen or fail to improve   The format for your next appointment:   In Person  Provider:   Pixie Casino, MD  or  APP       Other Instructions  How to Increase Your Level of Physical Activity Getting regular physical activity is important for your overall health and well-being. Most people do not get enough exercise. There are easy ways to increase your level of physical activity, even if you have not been very active in the past or if you are just starting out. What are the benefits of physical activity? Physical activity has many short-term and long-term benefits. Being active on a regular basis can improve your physical and mental health as well as provide other benefits. Physical health benefits Helping you  lose weight or maintain a healthy weight. Strengthening your muscles and bones. Reducing your risk of certain long-term (chronic) diseases, including heart disease, cancer, and diabetes. Being able to move around more easily and for longer periods of time without getting tired (increased endurance or stamina). Improving your ability to fight off illness (enhanced immunity). Being able to sleep better. Helping you stay healthy as you get older, including: Helping you stay mobile, or capable of walking and moving around. Preventing accidents, such as falls. Increasing life expectancy. Mental health benefits Boosting your mood and improving your self-esteem. Lowering your chance of having mental health problems, such as depression or anxiety. Helping you feel good about your body. Other benefits Finding new sources of fun and enjoyment. Meeting new people who share a common interest. Before you begin If you have a chronic illness or have not been active for a while, check with your health care provider about how to get started. Ask your health care provider what activities are safe for you. Start out slowly. Walking or doing some simple chair exercises is a good place to start, especially if you have not been active before or for a long time. Set goals that you can work toward. Ask your health care provider how much exercise is best for you. In general, most adults should: Do moderate-intensity exercise for at least 150 minutes each week (30 minutes on most days of the week) or vigorous exercise for at least 75 minutes each week, or a combination of  these. Moderate-intensity exercise can include walking at a quick pace, biking, yoga, water aerobics, or gardening. Vigorous exercise involves activities that take more effort, such as jogging or running, playing sports, swimming laps, or jumping rope. Do strength exercises on at least 2 days each week. This can include weight lifting, body weight  exercises, and resistance-band exercises. How to be more physically active Make a plan  Try to find activities that you enjoy. You are more likely to commit to an exercise routine if it does not feel like a chore. If you have bone or joint problems, choose low-impact exercises, like walking or swimming. Use these tips for being successful with an exercise plan: Find a workout partner for accountability. Join a group or class, such as an aerobics class, cycling class, or sports team. Make family time active. Go for a walk, bike, or swim. Include a variety of exercises each week. Consider using a fitness tracker, such as a mobile phone app or a device worn like a watch, that will count the number of steps you take each day. Many people strive to reach 10,000 steps a day. Find ways to be active in your daily routines Besides your formal exercise plans, you can find ways to do physical activity during your daily routines, such as: Walking or biking to work or to the store. Taking the stairs instead of the elevator. Parking farther away from the door at work or at the store. Planning walking meetings. Walking around while you are on the phone. Where to find more information Centers for Disease Control and Prevention: CampusCasting.com.pt President's Council on Fitness, Sports & Nutrition: www.fitness.gov ChooseMyPlate: http://www.harvey.com/ Contact a health care provider if: You have headaches, muscle aches, or joint pain that is concerning. You feel dizzy or light-headed while exercising. You faint. You feel your heart skipping, racing, or fluttering. You have chest pain while exercising. Summary Exercise benefits your mind and body at any age, even if you are just starting out. If you have a chronic illness or have not been active for a while, check with your health care provider before increasing your physical activity. Choose activities that are safe and enjoyable for you. Ask your  health care provider what activities are safe for you. Start slowly. Tell your health care provider if you have problems as you start to increase your activity level. This information is not intended to replace advice given to you by your health care provider. Make sure you discuss any questions you have with your health care provider. Document Revised: 01/10/2021 Document Reviewed: 01/10/2021 Elsevier Patient Education  2023 Elsevier Inc.  Important Information About Sugar

## 2022-07-21 NOTE — Progress Notes (Unsigned)
Cardiology Office Note:    Date:  07/23/2022   ID:  Jessica Kirby, DOB 1956/03/30, MRN [id number]  PCP:  Coral Spikes, DO   [facility] Cardiologist:  Pixie Casino, MD     Referring MD: Coral Spikes, DO   Chief Complaint  Patient presents with   Follow-up    Post cath.    History of Present Illness:    Jessica Kirby is a 66 y.o. female with a hx of GERD, hypertension, hypothyroidism and history of DM2.  Patient was initially referred to cardiology service for evaluation of heart murmur.  Subsequent echocardiogram showed EF of 70 to 75%, no regional wall motion abnormality, mild LVH.  Normal diastolic function.  No significant valve issue.  She reported family history of heart disease.  Patient was last seen by Dr. Debara Pickett on 03/26/2022 at which time her EKG showed T wave inversion in lead III and aVF.  She was also having fatigue with exertion and intermittent chest discomfort, therefore a nuclear stress test was recommended.  Myoview obtained on 04/02/2022 came back intermediate risk with medium defect of mild reduction in uptake present in the apical to basal anterior location which is reversible consistent with mild ischemia, ST depression in V4-V6 noted during stress.   I last saw the patient on 06/26/2022 at which time she continued to have worsening dyspnea on exertion.  She wished to proceed with cardiac catheterization.  Scheduled cardiac apposition performed on 07/01/2022 showed only 10% diagonal lesion, no significant coronary artery disease.  Medical therapy to optimize blood pressure control and lipid management.  Her stress test was false positive.    Patient presents today accompanied by friend.  We have reviewed the recent cardiac catheterization result.  Her radial cath site appears to be stable.  She does have a little bit numbness in her thumb, however the radial artery distal to the cast hide showed a very strong pulse.  I do not think her numbness  is related to vascular injury.  Her blood pressure is borderline high today however previously was normal.  I recommended continue on the current therapy.  She can follow-up with cardiology service on as-needed basis.    Past Medical History:  Diagnosis Date   Allergy    Anxiety    Insomnia   Depression    Insomnia   Diabetes mellitus without complication ([place]) 4/0/9811   GERD (gastroesophageal reflux disease)    Herniated disc    Hypertension    Hypothyroidism    not medicated at this time   Osteoporosis    Thyroid disease    Hypothyroidism    Past Surgical History:  Procedure Laterality Date   ABDOMINAL HYSTERECTOMY     COLONOSCOPY N/A 04/07/2016   Procedure: COLONOSCOPY;  Surgeon: Daneil Dolin, MD;  [facility];  Service: Endoscopy;  Laterality: N/A;  8:30 AM   LEFT HEART CATH AND CORONARY ANGIOGRAPHY N/A 07/01/2022   Procedure: LEFT HEART CATH AND CORONARY ANGIOGRAPHY;  Surgeon: Troy Sine, MD;  Location: [facility];  Service: Cardiovascular;  Laterality: N/A;   TUBAL LIGATION     WRIST SURGERY      Current Medications: Current Meds  Medication Sig   Accu-Chek Softclix Lancets lancets Use to check blood sugars daily before meals E11.9   ALPRAZolam (XANAX) 0.5 MG tablet Take 1 tablet (0.5 mg total) by mouth daily as needed for anxiety.   amLODipine (NORVASC) 10 MG  tablet TAKE 1 TABLET(10 MG) BY MOUTH DAILY   aspirin EC 81 MG tablet Take 81 mg by mouth daily.    blood glucose meter kit and supplies Dispense based on patient and insurance preference. Use to test glucose twice daily  (FOR ICD-10,  E11.9).   Calcium Carbonate-Vitamin D (CALCIUM-VITAMIN D3 PO) Take 1 tablet by mouth daily.   glucose blood test strip Use to check blood sugars daily before meals E11.9   hydrochlorothiazide (HYDRODIURIL) 25 MG tablet Take 1 tablet (25 mg total) by mouth daily.   levocetirizine (XYZAL) 5 MG tablet Take 5 mg by mouth daily as needed for allergies.    Multiple Vitamins-Minerals (CENTRUM SILVER PO) Take 1 tablet by mouth daily.   pantoprazole (PROTONIX) 20 MG tablet Take 1 tablet (20 mg total) by mouth daily. (Patient taking differently: Take 20 mg by mouth daily as needed for heartburn or indigestion.)   rosuvastatin (CRESTOR) 10 MG tablet Take 1 tablet (10 mg total) by mouth daily.   triamcinolone (NASACORT) 55 MCG/ACT AERO nasal inhaler Place 2 sprays into the nose daily.   [DISCONTINUED] metoprolol succinate (TOPROL-XL) 25 MG 24 hr tablet Take 1 tablet (25 mg total) by mouth daily.     Allergies:   Fosamax [alendronate sodium], Vioxx [rofecoxib], Losartan, and Sulfa antibiotics   Social History   Socioeconomic History   Marital status: Widowed    Spouse name: Not on file   Number of children: Not on file   Years of education: Not on file   Highest education level: Not on file  Occupational History   Not on file  Tobacco Use   Smoking status: Never    Passive exposure: Yes   Smokeless tobacco: Never  Vaping Use   Vaping Use: Never used  Substance and Sexual Activity   Alcohol use: No    Alcohol/week: 0.0 standard drinks of alcohol   Drug use: No   Sexual activity: Yes  Other Topics Concern   Not on file  Social History Narrative   Not on file   Social Determinants of Health   Financial Resource Strain: Not on file  Food Insecurity: Not on file  Transportation Needs: Not on file  Physical Activity: Not on file  Stress: Not on file  Social Connections: Not on file     Family History: The patient's family history includes Bladder Cancer in an other family member; Diabetes in her father; Heart attack in her mother; Heart disease in her mother. There is no history of Colon cancer.  ROS:   Please see the history of present illness.     All other systems reviewed and are negative.  EKGs/Labs/Other Studies Reviewed:    The following studies were reviewed today:  Echo 02/12/2022 1. Left ventricular ejection  fraction, by estimation, is 70 to 75%. The  left ventricle has hyperdynamic function. The left ventricle has no  regional wall motion abnormalities. There is mild left ventricular  hypertrophy. Left ventricular diastolic  parameters were normal.   2. Right ventricular systolic function is normal. The right ventricular  size is normal.   3. The mitral valve is normal in structure. No evidence of mitral valve  regurgitation.   4. The aortic valve is normal in structure. Aortic valve regurgitation is  not visualized.   EKG:  EKG is not ordered today.    Recent Labs: 02/03/2022: B Natriuretic Peptide 16.0 06/10/2022: ALT 17; BUN 14; Creatinine, Ser 0.72; Hemoglobin 13.6; Platelets 262; Potassium 4.5; Sodium 144; TSH  2.820  Recent Lipid Panel    Component Value Date/Time   CHOL 192 04/01/2022 0843   TRIG 113 04/01/2022 0843   HDL 52 04/01/2022 0843   CHOLHDL 3.7 04/01/2022 0843   CHOLHDL 3.9 06/25/2014 0749   VLDL 17 06/25/2014 0749   LDLCALC 120 (H) 04/01/2022 0843     Risk Assessment/Calculations:           Physical Exam:    VS:  BP (!) 140/74 (BP Location: Right Arm, Patient Position: Sitting, Cuff Size: Large)   Pulse 75   Ht '5\' 2"'  (1.575 m)   Wt 205 lb (93 kg)   BMI 37.49 kg/m        Wt Readings from Last 3 Encounters:  07/21/22 205 lb (93 kg)  07/01/22 200 lb (90.7 kg)  06/26/22 200 lb 9.6 oz (91 kg)     GEN:  Well nourished, well developed in no acute distress HEENT: Normal NECK: No JVD; No carotid bruits LYMPHATICS: No lymphadenopathy CARDIAC: RRR, no murmurs, rubs, gallops RESPIRATORY:  Clear to auscultation without rales, wheezing or rhonchi  ABDOMEN: Soft, non-tender, non-distended MUSCULOSKELETAL:  No edema; No deformity  SKIN: Warm and dry NEUROLOGIC:  Alert and oriented x 3 PSYCHIATRIC:  Normal affect   ASSESSMENT:    1. DOE (dyspnea on exertion)   2. Primary hypertension    PLAN:    In order of problems listed above:  Dyspnea on  exertion: Recent Myoview was abnormal, however subsequent cardiac catheterization on 07/01/2022 only showed 10% D1 lesion, otherwise no significant disease.  Myoview was false positive.  No further work-up was recommended.  Patient to follow-up with cardiology service on a as needed basis.  Hypertension: On amlodipine, HCTZ and metoprolol succinate.  Blood pressure is mildly elevated today, however normally it is very well controlled.           Medication Adjustments/Labs and Tests Ordered: Current medicines are reviewed at length with the patient today.  Concerns regarding medicines are outlined above.  No orders of the defined types were placed in this encounter.  No orders of the defined types were placed in this encounter.   Patient Instructions  Medication Instructions:  Your physician recommends that you continue on your current medications as directed. Please refer to the Current Medication list given to you today.  *If you need a refill on your cardiac medications before your next appointment, please call your pharmacy*  Lab Work: NONE ordered at this time of appointment   If you have labs (blood work) drawn today and your tests are completely normal, you will receive your results only by: [place] (if you have MyChart) OR A paper copy in the mail If you have any lab test that is abnormal or we need to change your treatment, we will call you to review the results.  Testing/Procedures: NONE ordered at this time of appointment   Follow-Up: At [facility], you and your health needs are our priority.  As part of our continuing mission to provide you with exceptional heart care, we have created designated Provider Care Teams.  These Care Teams include your primary Cardiologist (physician) and Advanced Practice Providers (APPs -  Physician Assistants and Nurse Practitioners) who all work together to provide you with the care you need, when you need it.   Your  next appointment:   As needed if symptoms worsen or fail to improve   The format for your next appointment:   In Person  Provider:  Pixie Casino, MD  or  APP       Other Instructions  How to Increase Your Level of Physical Activity Getting regular physical activity is important for your overall health and well-being. Most people do not get enough exercise. There are easy ways to increase your level of physical activity, even if you have not been very active in the past or if you are just starting out. What are the benefits of physical activity? Physical activity has many short-term and long-term benefits. Being active on a regular basis can improve your physical and mental health as well as provide other benefits. Physical health benefits Helping you lose weight or maintain a healthy weight. Strengthening your muscles and bones. Reducing your risk of certain long-term (chronic) diseases, including heart disease, cancer, and diabetes. Being able to move around more easily and for longer periods of time without getting tired (increased endurance or stamina). Improving your ability to fight off illness (enhanced immunity). Being able to sleep better. Helping you stay healthy as you get older, including: Helping you stay mobile, or capable of walking and moving around. Preventing accidents, such as falls. Increasing life expectancy. Mental health benefits Boosting your mood and improving your self-esteem. Lowering your chance of having mental health problems, such as depression or anxiety. Helping you feel good about your body. Other benefits Finding new sources of fun and enjoyment. Meeting new people who share a common interest. Before you begin If you have a chronic illness or have not been active for a while, check with your health care provider about how to get started. Ask your health care provider what activities are safe for you. Start out slowly. Walking or doing some  simple chair exercises is a good place to start, especially if you have not been active before or for a long time. Set goals that you can work toward. Ask your health care provider how much exercise is best for you. In general, most adults should: Do moderate-intensity exercise for at least 150 minutes each week (30 minutes on most days of the week) or vigorous exercise for at least 75 minutes each week, or a combination of these. Moderate-intensity exercise can include walking at a quick pace, biking, yoga, water aerobics, or gardening. Vigorous exercise involves activities that take more effort, such as jogging or running, playing sports, swimming laps, or jumping rope. Do strength exercises on at least 2 days each week. This can include weight lifting, body weight exercises, and resistance-band exercises. How to be more physically active Make a plan  Try to find activities that you enjoy. You are more likely to commit to an exercise routine if it does not feel like a chore. If you have bone or joint problems, choose low-impact exercises, like walking or swimming. Use these tips for being successful with an exercise plan: Find a workout partner for accountability. Join a group or class, such as an aerobics class, cycling class, or sports team. Make family time active. Go for a walk, bike, or swim. Include a variety of exercises each week. Consider using a fitness tracker, such as a mobile phone app or a device worn like a watch, that will count the number of steps you take each day. Many people strive to reach 10,000 steps a day. Find ways to be active in your daily routines Besides your formal exercise plans, you can find ways to do physical activity during your daily routines, such as: Walking or biking to work or to  the store. Taking the stairs instead of the elevator. Parking farther away from the door at work or at the store. Planning walking meetings. Walking around while you are on  the phone. Where to find more information Centers for Disease Control and Prevention: WorkDashboard.es President's Council on Fitness, Sports & Nutrition: www.fitness.gov ChooseMyPlate: MassVoice.es Contact a health care provider if: You have headaches, muscle aches, or joint pain that is concerning. You feel dizzy or light-headed while exercising. You faint. You feel your heart skipping, racing, or fluttering. You have chest pain while exercising. Summary Exercise benefits your mind and body at any age, even if you are just starting out. If you have a chronic illness or have not been active for a while, check with your health care provider before increasing your physical activity. Choose activities that are safe and enjoyable for you. Ask your health care provider what activities are safe for you. Start slowly. Tell your health care provider if you have problems as you start to increase your activity level. This information is not intended to replace advice given to you by your health care provider. Make sure you discuss any questions you have with your health care provider. Document Revised: 01/10/2021 Document Reviewed: 01/10/2021 Elsevier Patient Education  [place]         Signed, [place], [place]  07/23/2022 11:52 PM    [place]

## 2022-08-04 ENCOUNTER — Ambulatory Visit (INDEPENDENT_AMBULATORY_CARE_PROVIDER_SITE_OTHER): Payer: Medicare Other | Admitting: Nurse Practitioner

## 2022-08-04 VITALS — BP 128/82 | Ht 62.0 in | Wt 205.6 lb

## 2022-08-04 DIAGNOSIS — R300 Dysuria: Secondary | ICD-10-CM

## 2022-08-04 LAB — POCT URINALYSIS DIPSTICK
Spec Grav, UA: 1.01 (ref 1.010–1.025)
pH, UA: 6 (ref 5.0–8.0)

## 2022-08-04 MED ORDER — NITROFURANTOIN MONOHYD MACRO 100 MG PO CAPS: 100.0000 mg | ORAL_CAPSULE | Freq: Two times a day (BID) | ORAL | 0 refills | Status: AC

## 2022-08-04 NOTE — Progress Notes (Signed)
   Subjective:    Patient ID: Jessica Kirby, female    DOB: Jul 22, 1956, 66 y.o.   MRN: [id number]  HPI Patient arrives with dysuria and frequency with urination x2-3 days. Patient denies any back pain, pelvic pain, fever, chills, body aches.   Results for orders placed or performed in visit on 08/04/22  POCT urinalysis dipstick  Result Value Ref Range   Color, UA     Clarity, UA     Glucose, UA     Bilirubin, UA     Ketones, UA     Spec Grav, UA 1.010 1.010 - 1.025   Blood, UA     pH, UA 6.0 5.0 - 8.0   Protein, UA     Urobilinogen, UA     Nitrite, UA     Leukocytes, UA     Appearance     Odor        Review of Systems  Genitourinary:  Positive for dysuria, frequency and urgency.  All other systems reviewed and are negative.      Objective:   Physical Exam Vitals reviewed.  Constitutional:      General: She is not in acute distress.    Appearance: Normal appearance. She is normal weight. She is not ill-appearing, toxic-appearing or diaphoretic.  HENT:     Head: Normocephalic and atraumatic.  Cardiovascular:     Rate and Rhythm: Normal rate and regular rhythm.     Pulses: Normal pulses.     Heart sounds: Normal heart sounds. No murmur heard. Pulmonary:     Effort: Pulmonary effort is normal. No respiratory distress.     Breath sounds: Normal breath sounds. No wheezing.  Musculoskeletal:     Comments: Grossly intact  Skin:    General: Skin is warm.     Capillary Refill: Capillary refill takes less than 2 seconds.  Neurological:     Mental Status: She is alert.     Comments: Grossly intact  Psychiatric:        Mood and Affect: Mood normal.        Behavior: Behavior normal.           Assessment & Plan:   1. Dysuria - Will treat apophylactically while culture pending.  - POCT urinalysis dipstick = negative - nitrofurantoin, macrocrystal-monohydrate, (MACROBID) 100 MG capsule; Take 1 capsule (100 mg total) by mouth 2 (two) times daily for 5 days.   Dispense: 10 capsule; Refill: 0 - Urine Culture -RTC if symptoms worsen or do not improve.

## 2022-08-07 ENCOUNTER — Ambulatory Visit (HOSPITAL_COMMUNITY)
Admission: RE | Admit: 2022-08-07 | Discharge: 2022-08-07 | Disposition: A | Discharge status: Home / Self Care | Payer: Medicare Other | Source: Ambulatory Visit | Attending: Family Medicine | Admitting: Family Medicine

## 2022-08-07 ENCOUNTER — Encounter: Payer: Self-pay | Admitting: Nurse Practitioner

## 2022-08-07 DIAGNOSIS — Z1231 Encounter for screening mammogram for malignant neoplasm of breast: Secondary | ICD-10-CM | POA: Diagnosis not present

## 2022-08-09 LAB — URINE CULTURE

## 2022-08-10 ENCOUNTER — Telehealth: Payer: Self-pay | Admitting: Family Medicine

## 2022-08-10 NOTE — Telephone Encounter (Signed)
Please advise. Thank you

## 2022-08-10 NOTE — Telephone Encounter (Signed)
Patient would like results of mammogram. 

## 2022-08-11 NOTE — Telephone Encounter (Signed)
Mychart message sent to patient.

## 2022-08-11 NOTE — Telephone Encounter (Signed)
Pt has read mychart message.

## 2022-08-13 ENCOUNTER — Ambulatory Visit: Payer: Medicare Other | Admitting: Internal Medicine

## 2022-08-18 ENCOUNTER — Ambulatory Visit (INDEPENDENT_AMBULATORY_CARE_PROVIDER_SITE_OTHER): Payer: Medicare Other | Admitting: Nurse Practitioner

## 2022-08-18 ENCOUNTER — Encounter: Payer: Self-pay | Admitting: Nurse Practitioner

## 2022-08-18 VITALS — BP 150/72 | HR 88 | Temp 97.2°F | Ht 62.0 in | Wt 210.0 lb

## 2022-08-18 DIAGNOSIS — R0981 Nasal congestion: Secondary | ICD-10-CM | POA: Diagnosis not present

## 2022-08-18 MED ORDER — CETIRIZINE HCL 10 MG PO TABS: 10.0000 mg | ORAL_TABLET | Freq: Every day | ORAL | 1 refills | Status: DC

## 2022-08-18 MED ORDER — GUAIFENESIN ER 600 MG PO TB12: 600.0000 mg | ORAL_TABLET | Freq: Two times a day (BID) | ORAL | 1 refills | Status: DC

## 2022-08-18 MED ORDER — FLUTICASONE PROPIONATE 50 MCG/ACT NA SUSP: 2.0000 | Freq: Every day | NASAL | 6 refills | Status: DC

## 2022-08-18 NOTE — Progress Notes (Signed)
   Subjective:    Patient ID: Jessica Kirby, female    DOB: 12-Apr-1956, 66 y.o.   MRN: [id number]  HPI  66 y/o female patient present to the clinic with nasal congestion , post nasal drainage x1 day. Denies any fevers, chills, body aches, headaches, sore throat.   Review of Systems  HENT:  Positive for congestion and postnasal drip.   All other systems reviewed and are negative.      Objective:   Physical Exam Vitals reviewed.  Constitutional:      General: She is not in acute distress.    Appearance: Normal appearance. She is normal weight. She is not ill-appearing, toxic-appearing or diaphoretic.  HENT:     Head: Normocephalic and atraumatic.     Right Ear: Tympanic membrane, ear canal and external ear normal.     Left Ear: Tympanic membrane, ear canal and external ear normal.     Nose: Rhinorrhea present. No congestion.     Mouth/Throat:     Mouth: Mucous membranes are moist.     Pharynx: Oropharynx is clear. No oropharyngeal exudate or posterior oropharyngeal erythema.  Eyes:     General: No scleral icterus.       Right eye: No discharge.        Left eye: No discharge.     Extraocular Movements: Extraocular movements intact.     Conjunctiva/sclera: Conjunctivae normal.     Pupils: Pupils are equal, round, and reactive to light.  Cardiovascular:     Rate and Rhythm: Normal rate and regular rhythm.     Pulses: Normal pulses.     Heart sounds: Normal heart sounds. No murmur heard. Pulmonary:     Effort: Pulmonary effort is normal. No respiratory distress.     Breath sounds: Normal breath sounds. No wheezing.  Musculoskeletal:     Cervical back: Normal range of motion and neck supple. Tenderness present. No rigidity.     Comments: Grossly intact  Lymphadenopathy:     Cervical: No cervical adenopathy.  Skin:    General: Skin is warm.     Capillary Refill: Capillary refill takes less than 2 seconds.  Neurological:     Mental Status: She is alert.     Comments:  Grossly intact  Psychiatric:        Mood and Affect: Mood normal.        Behavior: Behavior normal.           Assessment & Plan:   1. Nasal congestion - Suspect viral process or possibly allergies - COVID-19, Flu A+B and RSV - cetirizine (ZYRTEC ALLERGY) 10 MG tablet; Take 1 tablet (10 mg total) by mouth daily.  Dispense: 30 tablet; Refill: 1 - fluticasone (FLONASE) 50 MCG/ACT nasal spray; Place 2 sprays into both nostrils daily.  Dispense: 16 g; Refill: 6 - guaiFENesin (MUCINEX) 600 MG 12 hr tablet; Take 1 tablet (600 mg total) by mouth 2 (two) times daily.  Dispense: 30 tablet; Refill: 1 - RTC if symptoms persist or worsen

## 2022-08-20 LAB — COVID-19, FLU A+B AND RSV
Influenza A, NAA: NOT DETECTED
Influenza B, NAA: NOT DETECTED
RSV, NAA: NOT DETECTED
SARS-CoV-2, NAA: NOT DETECTED

## 2022-08-20 LAB — SPECIMEN STATUS REPORT

## 2022-08-27 ENCOUNTER — Ambulatory Visit: Payer: Medicare Other | Admitting: Internal Medicine

## 2022-09-02 DIAGNOSIS — M9901 Segmental and somatic dysfunction of cervical region: Secondary | ICD-10-CM | POA: Diagnosis not present

## 2022-09-02 DIAGNOSIS — M542 Cervicalgia: Secondary | ICD-10-CM | POA: Diagnosis not present

## 2022-09-02 DIAGNOSIS — M9902 Segmental and somatic dysfunction of thoracic region: Secondary | ICD-10-CM | POA: Diagnosis not present

## 2022-09-02 DIAGNOSIS — M546 Pain in thoracic spine: Secondary | ICD-10-CM | POA: Diagnosis not present

## 2022-09-10 ENCOUNTER — Telehealth: Payer: Self-pay

## 2022-09-10 DIAGNOSIS — I10 Essential (primary) hypertension: Secondary | ICD-10-CM

## 2022-09-10 MED ORDER — AMLODIPINE BESYLATE 10 MG PO TABS: ORAL_TABLET | ORAL | 0 refills | Status: DC

## 2022-09-10 NOTE — Telephone Encounter (Signed)
Prescription sent electronically to pharmacy  Left message to return call 

## 2022-09-10 NOTE — Telephone Encounter (Signed)
Encourage patient to contact the pharmacy for refills or they can request refills through [facility]  (Please schedule appointment if patient has not been seen in over a year)    WHAT PHARMACY WOULD THEY LIKE THIS SENT TO: [facility] on [address]   MEDICATION NAME & DOSE:amLODipine (NORVASC) 10 MG tablet   NOTES/COMMENTS FROM PATIENT:Pt has been getting this medication she don't know where they are sending for refills she just finished her refills       Front office please notify patient: It takes 48-72 hours to process rx refill requests Ask patient to call pharmacy to ensure rx is ready before heading there.

## 2022-09-10 NOTE — Telephone Encounter (Signed)
Cook, [place] G, DO     Yes okay to refill.

## 2022-09-11 NOTE — Telephone Encounter (Signed)
Pt returned call and verbalized understanding  

## 2022-09-17 DIAGNOSIS — M79672 Pain in left foot: Secondary | ICD-10-CM | POA: Diagnosis not present

## 2022-09-17 DIAGNOSIS — E1140 Type 2 diabetes mellitus with diabetic neuropathy, unspecified: Secondary | ICD-10-CM | POA: Diagnosis not present

## 2022-09-17 DIAGNOSIS — L110 Acquired keratosis follicularis: Secondary | ICD-10-CM | POA: Diagnosis not present

## 2022-09-17 DIAGNOSIS — M79675 Pain in left toe(s): Secondary | ICD-10-CM | POA: Diagnosis not present

## 2022-09-17 DIAGNOSIS — M79674 Pain in right toe(s): Secondary | ICD-10-CM | POA: Diagnosis not present

## 2022-09-17 DIAGNOSIS — M79671 Pain in right foot: Secondary | ICD-10-CM | POA: Diagnosis not present

## 2022-09-22 ENCOUNTER — Other Ambulatory Visit: Payer: Self-pay | Admitting: Family Medicine

## 2022-09-24 ENCOUNTER — Telehealth: Payer: Self-pay

## 2022-09-24 NOTE — Telephone Encounter (Signed)
Prescription was refilled today per chart   Left message to return call to notify patient.

## 2022-09-24 NOTE — Telephone Encounter (Signed)
Encourage patient to contact the pharmacy for refills or they can request refills through [facility]  (Please schedule appointment if patient has not been seen in over a year)    WHAT PHARMACY WOULD THEY LIKE THIS SENT TO: [id number] - [place], [place] - [address]   MEDICATION NAME & DOSE:hydrochlorothiazide (HYDRODIURIL) 25 MG tablet   NOTES/COMMENTS FROM PATIENT:      Front office please notify patient: It takes 48-72 hours to process rx refill requests Ask patient to call pharmacy to ensure rx is ready before heading there.

## 2022-10-02 NOTE — Telephone Encounter (Signed)
Patient picked up script 09/25/22 per pharmacy.

## 2022-10-12 ENCOUNTER — Ambulatory Visit (INDEPENDENT_AMBULATORY_CARE_PROVIDER_SITE_OTHER): Payer: Medicare Other | Admitting: Gastroenterology

## 2022-10-12 DIAGNOSIS — D225 Melanocytic nevi of trunk: Secondary | ICD-10-CM | POA: Diagnosis not present

## 2022-10-12 DIAGNOSIS — L821 Other seborrheic keratosis: Secondary | ICD-10-CM | POA: Diagnosis not present

## 2022-10-15 ENCOUNTER — Encounter (INDEPENDENT_AMBULATORY_CARE_PROVIDER_SITE_OTHER): Payer: Self-pay | Admitting: Gastroenterology

## 2022-10-15 ENCOUNTER — Ambulatory Visit (INDEPENDENT_AMBULATORY_CARE_PROVIDER_SITE_OTHER): Payer: Medicare Other | Admitting: Gastroenterology

## 2022-10-15 VITALS — BP 149/85 | HR 76 | Temp 97.8°F | Ht 63.0 in | Wt 213.1 lb

## 2022-10-15 DIAGNOSIS — K219 Gastro-esophageal reflux disease without esophagitis: Secondary | ICD-10-CM

## 2022-10-15 DIAGNOSIS — K5904 Chronic idiopathic constipation: Secondary | ICD-10-CM

## 2022-10-15 MED ORDER — PANTOPRAZOLE SODIUM 20 MG PO TBEC: 20.0000 mg | DELAYED_RELEASE_TABLET | Freq: Every day | ORAL | 3 refills | Status: DC

## 2022-10-15 NOTE — Patient Instructions (Addendum)
You can continue to take protonix 20mg  as needed, refill has been sent to your pharmacy  Avoid greasy, spicy, fried, citrus foods, and be mindful that caffeine, carbonated drinks, chocolate and alcohol can increase reflux symptoms Stay upright 2-3 hours after eating, prior to lying down and avoid eating late in the evenings.  As discussed, if miralax is not well tolerated, you can try adding benefiber 1T twice daily with a meal, make sure water intake is good, aim for atleast 64 oz per day. Increase fruits, veggies and whole grains, kiwi and prunes are especially good for constipation  Please let me know if you have any new or worsening symptoms  Please follow up with PCP regarding elevated BP  Follow up 1 year

## 2022-10-15 NOTE — Progress Notes (Addendum)
Referring Provider: Celene Squibb, MD Primary Care Physician:  Coral Spikes, DO Primary GI Physician:Castaneda   Chief Complaint  Patient presents with   Follow-up    Doing well, no issues.    HPI:   Jessica Kirby is a 67 y.o. female with past medical history of anxiety, hypertension, hypothyroidism, depression, diabetes   Patient presenting today for follow up of GERD and constipation.  Last seen January 2023, at that time having occasional regurgitation of pills, 1-2x/week, mild heartburn when this occurs, takes famotidine which helped with symptoms. Taking miralax 1-2x/week, intermittnet constipation with BMs 3-4x/week. Down 47 lbs with dietary changes.   Recommend starting protonix 20mg  daily, reflux precautions, stop famotidine, miralax 1 capful per day.  Present:  She notes decrease in regurgitation issues since she came off of potassium pills, she is taking protonix PRN, maybe twice per week when she eats something she knows she shouldn't. Will have some heartburn when she experiences symptoms, no further regurgitation. No dysphagia or odynophagia. Denies nausea, vomiting, early satiety. She avoid caffeine, spicy foods and greasy foods.   She is still having some issues with constipation. She is trying to have a BM once a day but occasionally she will skip a day. She feels that the HCTZ she is on dries her out a lot and she requires more water. Stools are hard sometimes. She is taking miralax as needed but it makes her feel bloated. She has taken laxatives in the past but was advised previously to use miralax insteasd. She is drinking around five 16 oz bottles of water per day. Tries to get some fruits and veggies in her diet. She has to strain on occasion to defecate. No rectal bleeding or melena. Had some abdominal pain a few days ago that resolved when she had a BM. Denies any changes in appetite or weight loss.   Last Colonoscopy:2017 diverticulosis In sigmoid and descending    Recommendations:  Repeat TCS in 2027  Past Medical History:  Diagnosis Date   Allergy    Anxiety    Insomnia   Depression    Insomnia   Diabetes mellitus without complication ([place]) 11/01/5571   GERD (gastroesophageal reflux disease)    Herniated disc    Hypertension    Hypothyroidism    not medicated at this time   Osteoporosis    Thyroid disease    Hypothyroidism    Past Surgical History:  Procedure Laterality Date   ABDOMINAL HYSTERECTOMY     COLONOSCOPY N/A 04/07/2016   Procedure: COLONOSCOPY;  Surgeon: Daneil Dolin, MD;  Location: [facility];  Service: Endoscopy;  Laterality: N/A;  8:30 AM   LEFT HEART CATH AND CORONARY ANGIOGRAPHY N/A 07/01/2022   Procedure: LEFT HEART CATH AND CORONARY ANGIOGRAPHY;  Surgeon: Troy Sine, MD;  Location: [facility];  Service: Cardiovascular;  Laterality: N/A;   TUBAL LIGATION     WRIST SURGERY      Current Outpatient Medications  Medication Sig Dispense Refill   Accu-Chek Softclix Lancets lancets Use to check blood sugars daily before meals E11.9 100 each 7   ALPRAZolam (XANAX) 0.5 MG tablet Take 1 tablet (0.5 mg total) by mouth daily as needed for anxiety. 30 tablet 3   amLODipine (NORVASC) 10 MG tablet TAKE 1 TABLET(10 MG) BY MOUTH DAILY 90 tablet 0   aspirin EC 81 MG tablet Take 81 mg by mouth daily.      blood glucose meter kit and supplies Dispense  based on patient and insurance preference. Use to test glucose twice daily  (FOR ICD-10,  E11.9). 1 each 5   Calcium Carbonate-Vitamin D (CALCIUM-VITAMIN D3 PO) Take 1 tablet by mouth daily.     cetirizine (ZYRTEC ALLERGY) 10 MG tablet Take 1 tablet (10 mg total) by mouth daily. 30 tablet 1   fluticasone (FLONASE) 50 MCG/ACT nasal spray Place 2 sprays into both nostrils daily. 16 g 6   glucose blood test strip Use to check blood sugars daily before meals E11.9 100 each 7   hydrochlorothiazide (HYDRODIURIL) 25 MG tablet TAKE 1 TABLET(25 MG) BY MOUTH DAILY 90 tablet 1    metoprolol succinate (TOPROL-XL) 25 MG 24 hr tablet Take 1 tablet (25 mg total) by mouth daily. 90 tablet 3   Multiple Vitamins-Minerals (CENTRUM SILVER PO) Take 1 tablet by mouth daily.     pantoprazole (PROTONIX) 20 MG tablet Take 1 tablet (20 mg total) by mouth daily. (Patient taking differently: Take 20 mg by mouth daily as needed for heartburn or indigestion.) 90 tablet 3   polyethylene glycol (MIRALAX / GLYCOLAX) 17 g packet Take 17 g by mouth daily.     rosuvastatin (CRESTOR) 10 MG tablet Take 1 tablet (10 mg total) by mouth daily. 90 tablet 3   No current facility-administered medications for this visit.    Allergies as of 10/15/2022 - Review Complete 10/15/2022  Allergen Reaction Noted   Fosamax [alendronate sodium] Other (See Comments) 12/21/2012   Vioxx [rofecoxib]  12/21/2012   Losartan Cough 06/13/2015   Sulfa antibiotics Hives 09/10/2011    Family History  Problem Relation Age of Onset   Diabetes Father    Heart attack Mother    Heart disease Mother    Bladder Cancer Other    Colon cancer Neg Hx     Social History   Socioeconomic History   Marital status: Widowed    Spouse name: Not on file   Number of children: Not on file   Years of education: Not on file   Highest education level: Not on file  Occupational History   Not on file  Tobacco Use   Smoking status: Never    Passive exposure: Yes   Smokeless tobacco: Never  Vaping Use   Vaping Use: Never used  Substance and Sexual Activity   Alcohol use: No    Alcohol/week: 0.0 standard drinks of alcohol   Drug use: No   Sexual activity: Yes  Other Topics Concern   Not on file  Social History Narrative   Not on file   Social Determinants of Health   Financial Resource Strain: Not on file  Food Insecurity: Not on file  Transportation Needs: Not on file  Physical Activity: Not on file  Stress: Not on file  Social Connections: Not on file   Review of systems General: negative for malaise, night  sweats, fever, chills, weight loss Neck: Negative for lumps, goiter, pain and significant neck swelling Resp: Negative for cough, wheezing, dyspnea at rest CV: Negative for chest pain, leg swelling, palpitations, orthopnea GI: denies melena, hematochezia, nausea, vomiting, diarrhea,dysphagia, odyonophagia, early satiety or unintentional weight loss. +occasional heartburn +constipation  MSK: Negative for joint pain or swelling, back pain, and muscle pain. Derm: Negative for itching or rash Psych: Denies depression, anxiety, memory loss, confusion. No homicidal or suicidal ideation.  Heme: Negative for prolonged bleeding, bruising easily, and swollen nodes. Endocrine: Negative for cold or heat intolerance, polyuria, polydipsia and goiter. Neuro: negative for tremor, gait imbalance,  syncope and seizures. The remainder of the review of systems is noncontributory.  Physical Exam: BP (!) 149/85 (BP Location: Right Arm, Patient Position: Sitting, Cuff Size: Large)   Pulse 76   Temp 97.8 F (36.6 C) (Oral)   Ht 5\' 3"  (1.6 m)   Wt 213 lb 1.6 oz (96.7 kg)   SpO2 97%   BMI 37.75 kg/m  General:   Alert and oriented. No distress noted. Pleasant and cooperative.  Head:  Normocephalic and atraumatic. Eyes:  Conjuctiva clear without scleral icterus. Mouth:  Oral mucosa pink and moist. Good dentition. No lesions. Heart: Normal rate and rhythm, s1 and s2 heart sounds present.  Lungs: Clear lung sounds in all lobes. Respirations equal and unlabored. Abdomen:  +BS, soft, non-tender and non-distended. No rebound or guarding. No HSM or masses noted. Derm: No palmar erythema or jaundice Msk:  Symmetrical without gross deformities. Normal posture. Extremities:  Without edema. Neurologic:  Alert and  oriented x4 Psych:  Alert and cooperative. Normal mood and affect.  Invalid input(s): "6 MONTHS"   ASSESSMENT: Jessica Kirby is a 67 y.o. female presenting today for follow up of GERD and  constipation.  GERD: well controlled with dietary measures and PPI PRN, taking protonix 20mg  maybe twice per week. No dysphagia, odynophagia, nausea, vomiting, early satiety. Avoiding trigger foods. Will continue with current regimen of PPI PRN as she is doing well with this, should continue with reflux precautions to include  Avoiding greasy, spicy, fried, citrus foods, and be mindful that caffeine, carbonated drinks, chocolate and alcohol can increase reflux symptoms, Stay upright 2-3 hours after eating, prior to lying down and avoid eating late in the evenings.  Constipation: still having some issues, water intake is good. Miralax causes bloating. Recommend increasing fruits, veggies and whole grains, especially kiwi and prunes, continue with good water intake and start benefiber 1T BID with meals.   The patient was found to have elevated blood pressure when vital signs were checked in the office. The blood pressure was rechecked by the nursing staff and it was found be persistently elevated >140/90 mmHg. No blurred vision, lightheadedness or dizziness associated. I personally advised to the patient to follow up closely with PCP for hypertension control.   PLAN:  Continue PPI as needed with reflux precautions  2.  Benefiber 1T BID with meals 3. Good water intake, aim for 64 oz per day 4. Diet high in fruits, veggies and whole grains.  5. Follow up with PCP for HTN  All questions were answered, patient verbalized understanding and is in agreement with plan as outlined above.   Follow Up: 1 year   Estelle Skibicki L. 71, MSN, APRN, AGNP-C Adult-Gerontology Nurse Practitioner [facility]  I have reviewed the note and agree with the APP's assessment as described in this progress note  Jeanmarie Hubert, MD Gastroenterology and Hepatology [facility]

## 2022-10-27 ENCOUNTER — Telehealth: Payer: Self-pay

## 2022-10-27 DIAGNOSIS — F419 Anxiety disorder, unspecified: Secondary | ICD-10-CM

## 2022-10-27 NOTE — Telephone Encounter (Signed)
Jessica Kirby has appt on the 21st of March with [place] she was told that she needs a letter from her primary care provider before her appt. Referral she thinks. Jessica Kirby at [employer] [phone]

## 2022-10-27 NOTE — Telephone Encounter (Signed)
Pt contacted and states that she is needing referral. Referral placed

## 2022-11-12 ENCOUNTER — Telehealth: Payer: Self-pay

## 2022-11-12 DIAGNOSIS — E119 Type 2 diabetes mellitus without complications: Secondary | ICD-10-CM

## 2022-11-12 DIAGNOSIS — E785 Hyperlipidemia, unspecified: Secondary | ICD-10-CM

## 2022-11-12 DIAGNOSIS — I10 Essential (primary) hypertension: Secondary | ICD-10-CM

## 2022-11-12 NOTE — Telephone Encounter (Signed)
Jessica Kirby is calling back to see if she needs to have blood work done before she comes back 12/19/22

## 2022-11-12 NOTE — Telephone Encounter (Signed)
Last labs completed 06/10/22-TSH, CMP14, CBC. Please advise. Thank you

## 2022-11-13 NOTE — Telephone Encounter (Signed)
Lab orders placed and pt is aware 

## 2022-11-24 DIAGNOSIS — I10 Essential (primary) hypertension: Secondary | ICD-10-CM | POA: Diagnosis not present

## 2022-11-24 DIAGNOSIS — E119 Type 2 diabetes mellitus without complications: Secondary | ICD-10-CM | POA: Diagnosis not present

## 2022-11-24 DIAGNOSIS — E785 Hyperlipidemia, unspecified: Secondary | ICD-10-CM | POA: Diagnosis not present

## 2022-11-25 ENCOUNTER — Other Ambulatory Visit: Payer: Self-pay | Admitting: Family Medicine

## 2022-11-25 DIAGNOSIS — F418 Other specified anxiety disorders: Secondary | ICD-10-CM

## 2022-11-25 LAB — CBC WITH DIFFERENTIAL/PLATELET
Basophils Absolute: 0.1 10*3/uL (ref 0.0–0.2)
Basos: 1 %
EOS (ABSOLUTE): 0.3 10*3/uL (ref 0.0–0.4)
Eos: 2 %
Hematocrit: 41.8 % (ref 34.0–46.6)
Hemoglobin: 13.9 g/dL (ref 11.1–15.9)
Immature Grans (Abs): 0 10*3/uL (ref 0.0–0.1)
Immature Granulocytes: 0 %
Lymphocytes Absolute: 4.2 10*3/uL — ABNORMAL HIGH (ref 0.7–3.1)
Lymphs: 40 %
MCH: 28.5 pg (ref 26.6–33.0)
MCHC: 33.3 g/dL (ref 31.5–35.7)
MCV: 86 fL (ref 79–97)
Monocytes Absolute: 0.7 10*3/uL (ref 0.1–0.9)
Monocytes: 7 %
Neutrophils Absolute: 5.3 10*3/uL (ref 1.4–7.0)
Neutrophils: 50 %
Platelets: 287 10*3/uL (ref 150–450)
RBC: 4.87 x10E6/uL (ref 3.77–5.28)
RDW: 11.6 % — ABNORMAL LOW (ref 11.7–15.4)
WBC: 10.5 10*3/uL (ref 3.4–10.8)

## 2022-11-25 LAB — LIPID PANEL
Chol/HDL Ratio: 2.7 ratio (ref 0.0–4.4)
Cholesterol, Total: 142 mg/dL (ref 100–199)
HDL: 53 mg/dL (ref >39)
LDL Chol Calc (NIH): 66 mg/dL (ref 0–99)
Triglycerides: 132 mg/dL (ref 0–149)
VLDL Cholesterol Cal: 23 mg/dL (ref 5–40)

## 2022-11-25 LAB — COMPREHENSIVE METABOLIC PANEL
ALT: 27 IU/L (ref 0–32)
AST: 24 IU/L (ref 0–40)
Albumin/Globulin Ratio: 1.8 (ref 1.2–2.2)
Albumin: 4.4 g/dL (ref 3.9–4.9)
Alkaline Phosphatase: 80 IU/L (ref 44–121)
BUN/Creatinine Ratio: 16 (ref 12–28)
BUN: 13 mg/dL (ref 8–27)
Bilirubin Total: 1 mg/dL (ref 0.0–1.2)
CO2: 23 mmol/L (ref 20–29)
Calcium: 9.5 mg/dL (ref 8.7–10.3)
Chloride: 103 mmol/L (ref 96–106)
Creatinine, Ser: 0.79 mg/dL (ref 0.57–1.00)
Globulin, Total: 2.4 g/dL (ref 1.5–4.5)
Glucose: 114 mg/dL — ABNORMAL HIGH (ref 70–99)
Potassium: 4.2 mmol/L (ref 3.5–5.2)
Sodium: 142 mmol/L (ref 134–144)
Total Protein: 6.8 g/dL (ref 6.0–8.5)
eGFR: 81 mL/min/{1.73_m2} (ref >59)

## 2022-11-25 LAB — HEMOGLOBIN A1C
Est. average glucose Bld gHb Est-mCnc: 148 mg/dL
Hgb A1c MFr Bld: 6.8 % — ABNORMAL HIGH (ref 4.8–5.6)

## 2022-11-25 LAB — MICROALBUMIN / CREATININE URINE RATIO
Creatinine, Urine: 83.9 mg/dL
Microalb/Creat Ratio: 36 mg/g creat — ABNORMAL HIGH (ref 0–29)
Microalbumin, Urine: 30.5 ug/mL

## 2022-11-26 DIAGNOSIS — M79671 Pain in right foot: Secondary | ICD-10-CM | POA: Diagnosis not present

## 2022-11-26 DIAGNOSIS — M79672 Pain in left foot: Secondary | ICD-10-CM | POA: Diagnosis not present

## 2022-11-26 DIAGNOSIS — M79674 Pain in right toe(s): Secondary | ICD-10-CM | POA: Diagnosis not present

## 2022-11-26 DIAGNOSIS — M79675 Pain in left toe(s): Secondary | ICD-10-CM | POA: Diagnosis not present

## 2022-11-26 DIAGNOSIS — L110 Acquired keratosis follicularis: Secondary | ICD-10-CM | POA: Diagnosis not present

## 2022-11-26 DIAGNOSIS — E1140 Type 2 diabetes mellitus with diabetic neuropathy, unspecified: Secondary | ICD-10-CM | POA: Diagnosis not present

## 2022-12-09 ENCOUNTER — Ambulatory Visit (INDEPENDENT_AMBULATORY_CARE_PROVIDER_SITE_OTHER): Payer: Medicare Other | Admitting: Family Medicine

## 2022-12-09 DIAGNOSIS — R3915 Urgency of urination: Secondary | ICD-10-CM | POA: Insufficient documentation

## 2022-12-09 DIAGNOSIS — R809 Proteinuria, unspecified: Secondary | ICD-10-CM | POA: Insufficient documentation

## 2022-12-09 DIAGNOSIS — I10 Essential (primary) hypertension: Secondary | ICD-10-CM | POA: Diagnosis not present

## 2022-12-09 DIAGNOSIS — E119 Type 2 diabetes mellitus without complications: Secondary | ICD-10-CM | POA: Diagnosis not present

## 2022-12-09 LAB — POCT URINALYSIS DIP (CLINITEK)
Bilirubin, UA: NEGATIVE
Blood, UA: NEGATIVE
Glucose, UA: NEGATIVE mg/dL
Ketones, POC UA: NEGATIVE mg/dL
Leukocytes, UA: NEGATIVE
Nitrite, UA: NEGATIVE
POC PROTEIN,UA: NEGATIVE
Spec Grav, UA: <=1.005 — AB (ref 1.010–1.025)
Urobilinogen, UA: 0.2 E.U./dL
pH, UA: 7 (ref 5.0–8.0)

## 2022-12-09 MED ORDER — VALSARTAN 80 MG PO TABS: 80.0000 mg | ORAL_TABLET | Freq: Every day | ORAL | 3 refills | Status: DC

## 2022-12-09 NOTE — Assessment & Plan Note (Signed)
A1c is currently at goal without the use of medication.  We discussed starting medication.  We also discussed dietary recommendations and seeing a nutritionist.  Patient would like to see nutritionist.  Referral placed.

## 2022-12-09 NOTE — Assessment & Plan Note (Signed)
Stopping amlodipine.  Starting valsartan.  Metabolic panel in 2 weeks.

## 2022-12-09 NOTE — Assessment & Plan Note (Signed)
Likely OAB.  Urine clear today.  Patient wants to wait on pharmacotherapy.  If continues to be problematic we will start on pharmacotherapy -Vesicare or Myrbetriq.

## 2022-12-09 NOTE — Patient Instructions (Signed)
Lab in 2 weeks.  Follow up in 3 months.

## 2022-12-09 NOTE — Progress Notes (Signed)
Subjective:  Patient ID: Jessica Kirby, female    DOB: [birth year]  Age: 66 y.o. MRN: [id number]  CC: Chief Complaint  Patient presents with   Diabetes    Blood sugars up to 260 non fasting and fasting 151  Frequent urination and some in continence getting worse x 1 week     HPI:  67 year old female with hypertension, GERD, type 2 diabetes, osteopenia, anxiety, hyperlipidemia presents for evaluation of the above.  Patient recently had an A1c obtained and it was 6.8.  This was done around 11/24/2022.  Patient reports that her sugars have been "up-and-down".  She states that she recently had fasting blood sugar of 151 and also had a nonfasting sugar of 260.  Patient states that she often overeats.  She would like to discuss this today.  Patient reports urinary urgency.  She has the urge to go and if she waits too long she may not be able to get to the bathroom on time.  No fever.  No abdominal pain.  Also, recent labs revealed elevated microalbumin/creatinine ratio of 36.  Will discuss this today.  Patient Active Problem List   Diagnosis Date Noted   Urinary urgency 12/09/2022   Proteinuria 12/09/2022   Chronic idiopathic constipation 10/15/2022   Hyperlipidemia 03/25/2022   Diabetes mellitus without complication ([place]) [id number]   Anxiety 11/04/2015   GERD (gastroesophageal reflux disease) 09/27/2013   Hypertension    Osteopenia     Social Hx   Social History   Socioeconomic History   Marital status: Widowed    Spouse name: Not on file   Number of children: Not on file   Years of education: Not on file   Highest education level: Not on file  Occupational History   Not on file  Tobacco Use   Smoking status: Never    Passive exposure: Yes   Smokeless tobacco: Never  Vaping Use   Vaping Use: Never used  Substance and Sexual Activity   Alcohol use: No    Alcohol/week: 0.0 standard drinks of alcohol   Drug use: No   Sexual activity: Yes  Other Topics Concern   Not  on file  Social History Narrative   Not on file   Social Determinants of Health   Financial Resource Strain: Not on file  Food Insecurity: Not on file  Transportation Needs: Not on file  Physical Activity: Not on file  Stress: Not on file  Social Connections: Not on file    Review of Systems Per HPI  Objective:  BP 133/73   Pulse 75   Temp (!) 97.3 F (36.3 C)   Ht '5\' 3"'$  (1.6 m)   Wt 216 lb (98 kg)   SpO2 95%   BMI 38.26 kg/m      12/09/2022   10:04 AM 10/15/2022    1:42 PM 08/18/2022    1:07 PM  BP/Weight  Systolic BP [id number]  Diastolic BP 73 85 72  Wt. (Lbs) 216 213.1 210  BMI 38.26 kg/m2 37.75 kg/m2 38.41 kg/m2    Physical Exam Vitals and nursing note reviewed.  Constitutional:      Appearance: Normal appearance. She is obese.  HENT:     Head: Normocephalic and atraumatic.  Cardiovascular:     Rate and Rhythm: Normal rate and regular rhythm.  Pulmonary:     Effort: Pulmonary effort is normal.     Breath sounds: Normal breath sounds. No wheezing or rales.  Neurological:  Mental Status: She is alert.  Psychiatric:     Comments: Anxious.     Lab Results  Component Value Date   WBC 10.5 11/24/2022   HGB 13.9 11/24/2022   HCT 41.8 11/24/2022   PLT 287 11/24/2022   GLUCOSE 114 (H) 11/24/2022   CHOL 142 11/24/2022   TRIG 132 11/24/2022   HDL 53 11/24/2022   LDLCALC 66 11/24/2022   ALT 27 11/24/2022   AST 24 11/24/2022   NA 142 11/24/2022   K 4.2 11/24/2022   CL 103 11/24/2022   CREATININE 0.79 11/24/2022   BUN 13 11/24/2022   CO2 23 11/24/2022   TSH 2.820 06/10/2022   INR 0.9 02/03/2022   HGBA1C 6.8 (H) 11/24/2022     Assessment & Plan:   Problem List Items Addressed This Visit       Cardiovascular and Mediastinum   Hypertension   Relevant Medications   valsartan (DIOVAN) 80 MG tablet   Other Relevant Orders   Basic Metabolic Panel     Endocrine   Diabetes mellitus without complication ([facility])    123456 is currently at goal  without the use of medication.  We discussed starting medication.  We also discussed dietary recommendations and seeing a nutritionist.  Patient would like to see nutritionist.  Referral placed.      Relevant Medications   valsartan (DIOVAN) 80 MG tablet   Other Relevant Orders   Amb ref to Medical Nutrition Therapy-MNT     Other   Proteinuria    Stopping amlodipine.  Starting valsartan.  Metabolic panel in 2 weeks.      Urinary urgency    Likely OAB.  Urine clear today.  Patient wants to wait on pharmacotherapy.  If continues to be problematic we will start on pharmacotherapy -Vesicare or Myrbetriq.      Relevant Orders   POCT URINALYSIS DIP (CLINITEK) (Completed)    Meds ordered this encounter  Medications   valsartan (DIOVAN) 80 MG tablet    Sig: Take 1 tablet (80 mg total) by mouth daily.    Dispense:  90 tablet    Refill:  3    Follow-up:  Return in about 3 months (around 03/11/2023).  [place]

## 2022-12-17 ENCOUNTER — Ambulatory Visit (INDEPENDENT_AMBULATORY_CARE_PROVIDER_SITE_OTHER): Payer: Medicare Other | Admitting: Clinical

## 2022-12-17 ENCOUNTER — Other Ambulatory Visit: Payer: Self-pay | Admitting: Family Medicine

## 2022-12-17 ENCOUNTER — Encounter (HOSPITAL_COMMUNITY): Payer: Self-pay

## 2022-12-17 DIAGNOSIS — F4321 Adjustment disorder with depressed mood: Secondary | ICD-10-CM

## 2022-12-17 DIAGNOSIS — F321 Major depressive disorder, single episode, moderate: Secondary | ICD-10-CM | POA: Diagnosis not present

## 2022-12-17 DIAGNOSIS — F4322 Adjustment disorder with anxiety: Secondary | ICD-10-CM

## 2022-12-17 DIAGNOSIS — I10 Essential (primary) hypertension: Secondary | ICD-10-CM

## 2022-12-17 NOTE — Progress Notes (Signed)
IN PERSON  I connected with Jessica Kirby on 12/17/22 at  9:00 AM EDT in person and verified that I am speaking with the correct person using two identifiers.  Location: Patient: Office Provider: Office    I discussed the limitations of evaluation and management by telemedicine and the availability of in person appointments. The patient expressed understanding and agreed to proceed. ( IN PERSON)   Comprehensive Clinical Assessment (CCA) Note  12/17/2022 [name] [id number]  Chief Complaint:  Difficulty with mood , anxiety, and unresolved grief Visit Diagnosis: MDD single episode moderate / Adjustment disorder with anxiety / Unresolved Grief   CCA Screening, Triage and Referral (STR)  Patient Reported Information How did you hear about [place]? No data recorded Referral name: No data recorded Referral phone number: No data recorded  Whom do you see for routine medical problems? No data recorded Practice/Facility Name: No data recorded Practice/Facility Phone Number: No data recorded Name of Contact: No data recorded Contact Number: No data recorded Contact Fax Number: No data recorded Prescriber Name: No data recorded Prescriber Address (if known): No data recorded  What Is the Reason for Your Visit/Call Today? No data recorded How Long Has This Been Causing You Problems? No data recorded What Do You Feel Would Help You the Most Today? No data recorded  Have You Recently Been in Any Inpatient Treatment (Hospital/Detox/Crisis Center/28-Day Program)? No data recorded Name/Location of Program/Hospital:No data recorded How Long Were You There? No data recorded When Were You Discharged? No data recorded  Have You Ever Received Services From [facility] Before? No data recorded Who Do You See at [facility]? No data recorded  Have You Recently Had Any Thoughts About Hurting Yourself? No data recorded Are You Planning to Commit Suicide/Harm Yourself At This time? No data  recorded  Have you Recently Had Thoughts About [place]? No data recorded Explanation: No data recorded  Have You Used Any Alcohol or Drugs in the Past 24 Hours? No data recorded How Long Ago Did You Use Drugs or Alcohol? No data recorded What Did You Use and How Much? No data recorded  Do You Currently Have a Therapist/Psychiatrist? No data recorded Name of Therapist/Psychiatrist: No data recorded  Have You Been Recently Discharged From Any Office Practice or Programs? No data recorded Explanation of Discharge From Practice/Program: No data recorded    CCA Screening Triage Referral Assessment Type of Contact: No data recorded Is this Initial or Reassessment? No data recorded Date Telepsych consult ordered in [facility]:  No data recorded Time Telepsych consult ordered in [facility]:  No data recorded  Patient Reported Information Reviewed? No data recorded Patient Left Without Being Seen? No data recorded Reason for Not Completing Assessment: No data recorded  Collateral Involvement: No data recorded  Does Patient Have a [place]? No data recorded Name and Contact of Legal Guardian: No data recorded If Minor and Not Living with Parent(s), Who has Custody? No data recorded Is CPS involved or ever been involved? No data recorded Is APS involved or ever been involved? No data recorded  Patient Determined To Be At Risk for Harm To Self or Others Based on Review of Patient Reported Information or Presenting Complaint? No data recorded Method: No data recorded Availability of Means: No data recorded Intent: No data recorded Notification Required: No data recorded Additional Information for Danger to Others Potential: No data recorded Additional Comments for Danger to Others Potential: No data recorded Are There Guns  or Other Weapons in [place]? No data recorded Types of Guns/Weapons: No data recorded Are These Weapons Safely Secured?                             No data recorded Who Could Verify You Are Able To Have These Secured: No data recorded Do You Have any Outstanding Charges, Pending Court Dates, Parole/Probation? No data recorded Contacted To Inform of Risk of Harm To Self or Others: No data recorded  Location of Assessment: No data recorded  Does Patient Present under Involuntary Commitment? No data recorded IVC Papers Initial File Date: No data recorded  [place] of Residence: No data recorded  Patient Currently Receiving the Following Services: No data recorded  Determination of Need: No data recorded  Options For Referral: No data recorded    CCA Biopsychosocial Intake/Chief Complaint:  The patient notes asking her PCP for referral for further evaluation for MH treatment services.  Current Symptoms/Problems: The patient notes her husband passed in 2019 and she has had difficulty with coping post the lost. The patient notes she is having difficulty moving through the greif process,   Patient Reported Schizophrenia/Schizoaffective Diagnosis in Past: No   Strengths: I get along with other people and i try to help others and relate easily with others.  Preferences: Watching Tv cleaning and doing yard work  Abilities: Animal nutritionist / Education administrator   Type of Services Patient Feels are Needed: The patient notes she takes Xanax to help her with Anxiety from her PCP / Individual Therapy   Initial Clinical Notes/Concerns: The patient notes prior online counsling since the loss of her husband, however, this did not help her to move through the grief process.   Mental Health Symptoms Depression:   Change in energy/activity; Fatigue; Increase/decrease in appetite; Weight gain/loss; Sleep (too much or little)   Duration of Depressive symptoms:  Greater than two weeks   Mania:   None   Anxiety:    Worrying; Sleep; Restlessness; Fatigue; Tension   Psychosis:   None   Duration of Psychotic symptoms: NA  Trauma:   None (The  patient notes difficulty with functioning post her husband passing away and more recently her son coming to live with her after he got into legal trouble.)   Obsessions:   None   Compulsions:   None   Inattention:   None   Hyperactivity/Impulsivity:   None   Oppositional/Defiant Behaviors:   None   Emotional Irregularity:   None   Other Mood/Personality Symptoms:   NA    Mental Status Exam Appearance and self-care  Stature:   Average   Weight:   Overweight   Clothing:   Casual   Grooming:   Normal   Cosmetic use:   Age appropriate   Posture/gait:   Normal   Motor activity:   Not Remarkable   Sensorium  Attention:   Normal   Concentration:   Anxiety interferes   Orientation:   X5   Recall/memory:   Normal   Affect and Mood  Affect:   Appropriate   Mood:   Anxious; Depressed   Relating  Eye contact:   Normal   Facial expression:   Responsive   Attitude toward examiner:   Cooperative   Thought and Language  Speech flow:  Normal   Thought content:   Appropriate to Mood and Circumstances   Preoccupation:   None   Hallucinations:   None  Organization:  Logical   Transport planner of Knowledge:   Good   Intelligence:   Average   Abstraction:   Normal   Judgement:   Good   Reality Testing:   Realistic   Insight:   Good   Decision Making:   Normal   Social Functioning  Social Maturity:   Responsible   Social Judgement:   Normal   Stress  Stressors:   Family conflict; Grief/losses; Illness (The patient notes conflict with her son who has come to live with her post her husband passing. The patient notes her husband passed in 29-Dec-2017. high blood pressure.)   Coping Ability:   Normal   Skill Deficits:   None   Supports:   [employer] involvement and notes currently she is attending grief share at [employer]. Spends time with her neighbors.)     Religion: Religion/Spirituality Are You A  Religious Person?: Yes What is Your Religious Affiliation?: Christian How Might This Affect Treatment?: NA  Leisure/Recreation: Leisure / Recreation Do You Have Hobbies?: No  Exercise/Diet: Exercise/Diet Do You Exercise?: Yes What Type of Exercise Do You Do?: Run/Walk (Attends family fitness center in the mornings.) How Many Times a Week Do You Exercise?: 1-3 times a week Have You Gained or Lost A Significant Amount of Weight in the Past Six Months?: No Do You Follow a Special Diet?: No Do You Have Any Trouble Sleeping?: Yes Explanation of Sleeping Difficulties: The patient notes difficulty with both falling asleep as well as staying asleep.   CCA Employment/Education Employment/Work Situation: Employment / Work Nurse, children's Situation: Retired Social research officer, government has Been Impacted by Current Illness: No What is the Longest Time Patient has Held a Job?: 17 Where was the Patient Employed at that Time?: [employer] Has Patient ever Been in the [employer]?: No  Education: Education Is Patient Currently Attending School?: No Last Grade Completed: 10 Name of High School: [employer]. Did You Graduate From [employer]?: No Did You Attend College?: No Did [place]?: No Did You Have Any Special Interests In School?: NA Did You Have An Individualized Education Program (IIEP): No Did You Have Any Difficulty At School?: No Patient's Education Has Been Impacted by Current Illness: No   CCA Family/Childhood History Family and Relationship History: Family history Marital status: Widowed Widowed, when?: The patient notes her husband passed away in 12/29/17 Are you sexually active?: No What is your sexual orientation?: Heterosexual . Has your sexual activity been affected by drugs, alcohol, medication, or emotional stress?: NA Does patient have children?: Yes How many children?: 2 How is patient's relationship with their children?: The  patient notes she has 2 sons one of which is currently living with her in the home and they have a conflictual relationship.  The patient notes having an ok relationship with her son who does not live in the home and she sees him on a regular basis.  Childhood History:  Childhood History By whom was/is the patient raised?: Both parents Additional childhood history information: NA Description of patient's relationship with caregiver when they were a child: The patient notes , " My mom and dad were good with me my dad was not really as involved". Patient's description of current relationship with people who raised him/her: The patient notes her mother and father are both deceased . How were you disciplined when you got in trouble as a child/adolescent?: Grounded Does patient have siblings?: Yes Description of patient's current relationship  with siblings: The patient notes 1 half sister on her mothers side and 3 half siblings on her fathers side. The patient notes she talks frequently and interacts on social media with her half siblings. Did patient suffer any verbal/emotional/physical/sexual abuse as a child?: No Did patient suffer from severe childhood neglect?: No Has patient ever been sexually abused/assaulted/raped as an adolescent or adult?: No Was the patient ever a victim of a crime or a disaster?: No Witnessed domestic violence?: No Has patient been affected by domestic violence as an adult?: No  Child/Adolescent Assessment:     CCA Substance Use Alcohol/Drug Use: Alcohol / Drug Use Pain Medications: See MAR Prescriptions: See MAR Over the Counter: Essential Multivitamin. History of alcohol / drug use?: No history of alcohol / drug abuse Longest period of sobriety (when/how long): NA                         ASAM's:  Six Dimensions of Multidimensional Assessment  Dimension 1:  Acute Intoxication and/or Withdrawal Potential:      Dimension 2:  Biomedical Conditions  and Complications:      Dimension 3:  Emotional, Behavioral, or Cognitive Conditions and Complications:     Dimension 4:  Readiness to Change:     Dimension 5:  Relapse, Continued use, or Continued Problem Potential:     Dimension 6:  Recovery/Living Environment:     ASAM Severity Score:    ASAM Recommended Level of Treatment:     Substance use Disorder (SUD)    Recommendations for Services/Supports/Treatments: Recommendations for Services/Supports/Treatments Recommendations For Services/Supports/Treatments: Individual Therapy, Medication Management  DSM5 Diagnoses: Patient Active Problem List   Diagnosis Date Noted   Urinary urgency 12/09/2022   Proteinuria 12/09/2022   Chronic idiopathic constipation 10/15/2022   Hyperlipidemia 03/25/2022   Diabetes mellitus without complication ([place]) [id number]   Anxiety 11/04/2015   GERD (gastroesophageal reflux disease) 09/27/2013   Hypertension    Osteopenia     Patient Centered Plan: Patient is on the following Treatment Plan(s): MDD Single episode, moderate / Adjustment Disorder with anxiety / unresolved grief   Referrals to Alternative Service(s): Referred to Alternative Service(s):   Place:   Date:   Time:    Referred to Alternative Service(s):   Place:   Date:   Time:    Referred to Alternative Service(s):   Place:   Date:   Time:    Referred to Alternative Service(s):   Place:   Date:   Time:      Collaboration of Care: No additional collaboration for this session.  Patient/Guardian was advised Release of Information must be obtained prior to any record release in order to collaborate their care with an outside provider. Patient/Guardian was advised if they have not already done so to contact the registration department to sign all necessary forms in order for [place] to release information regarding their care.   Consent: Patient/Guardian gives verbal consent for treatment and assignment of benefits for services provided during  this visit. Patient/Guardian expressed understanding and agreed to proceed.   I discussed the assessment and treatment plan with the patient. The patient was provided an opportunity to ask questions and all were answered. The patient agreed with the plan and demonstrated an understanding of the instructions.   The patient was advised to call back or seek an in-person evaluation if the symptoms worsen or if the condition fails to improve as anticipated.  I provided 60 minutes of non-face-to-face time  during this encounter.  Lennox Grumbles, LCSW  12/17/2022

## 2022-12-23 DIAGNOSIS — I10 Essential (primary) hypertension: Secondary | ICD-10-CM | POA: Diagnosis not present

## 2022-12-24 ENCOUNTER — Ambulatory Visit: Payer: Medicare Other | Admitting: Family Medicine

## 2022-12-24 LAB — BASIC METABOLIC PANEL
BUN/Creatinine Ratio: 11 — ABNORMAL LOW (ref 12–28)
BUN: 8 mg/dL (ref 8–27)
CO2: 24 mmol/L (ref 20–29)
Calcium: 9.4 mg/dL (ref 8.7–10.3)
Chloride: 103 mmol/L (ref 96–106)
Creatinine, Ser: 0.74 mg/dL (ref 0.57–1.00)
Glucose: 110 mg/dL — ABNORMAL HIGH (ref 70–99)
Potassium: 4.3 mmol/L (ref 3.5–5.2)
Sodium: 143 mmol/L (ref 134–144)
eGFR: 88 mL/min/{1.73_m2} (ref >59)

## 2022-12-30 ENCOUNTER — Ambulatory Visit: Payer: Medicare Other | Admitting: Obstetrics & Gynecology

## 2022-12-30 ENCOUNTER — Encounter: Payer: Self-pay | Admitting: Obstetrics & Gynecology

## 2022-12-30 VITALS — BP 160/80 | HR 67 | Ht 63.0 in | Wt 220.0 lb

## 2022-12-30 DIAGNOSIS — I10 Essential (primary) hypertension: Secondary | ICD-10-CM

## 2022-12-30 DIAGNOSIS — N3281 Overactive bladder: Secondary | ICD-10-CM

## 2022-12-30 DIAGNOSIS — E669 Obesity, unspecified: Secondary | ICD-10-CM

## 2022-12-30 DIAGNOSIS — Z90710 Acquired absence of both cervix and uterus: Secondary | ICD-10-CM | POA: Diagnosis not present

## 2022-12-30 MED ORDER — MIRABEGRON ER 25 MG PO TB24: 25.0000 mg | ORAL_TABLET | Freq: Every day | ORAL | 6 refills | Status: DC

## 2022-12-30 NOTE — Progress Notes (Signed)
GYN VISIT Patient name: Jessica Kirby MRN [id number]  Date of birth: September 28, 1956 Chief Complaint:   Gynecologic Exam (Having trouble with her bladder)  History of Present Illness:   Jessica Kirby is a 67 y.o. PM, [place] female being seen today for the following concerns:  Urinary issues: Today she notes on/off symptoms for the past several years.  Notes she goes void 9-10x per day.  Up at night a few times to void.  Just started wearing pad- notes that "probably" stress incontinence.  Notes incomplete voiding.  Notes that as soon as she is out, when she comes home, she notes urgency/urge incontinence.    In the past, seen by PCP- tried Myrbetriq- notes she didn't feel great while taking it.   S/p hysterectomy with [facility] for prolapse  No LMP recorded. Patient has had a hysterectomy.     12/30/2022    1:28 PM 12/17/2022    9:22 AM 12/09/2022   10:06 AM 04/27/2022    2:38 PM 03/19/2021   10:49 AM  Depression screen PHQ 2/9  Decreased Interest 0  1 0 0  Down, Depressed, Hopeless 0  0 0 0  PHQ - 2 Score 0  1 0 0  Altered sleeping 1  2  1   Tired, decreased energy 1  2  1   Change in appetite 2  3  0  Feeling bad or failure about yourself  0  0  0  Trouble concentrating 0  0  0  Moving slowly or fidgety/restless 0  0  0  Suicidal thoughts 0  0  0  PHQ-9 Score 4  8  2   Difficult doing work/chores   Somewhat difficult  Not difficult at all     Information is confidential and restricted. Go to Review Flowsheets to unlock data.     Review of Systems:   Pertinent items are noted in HPI Denies fever/chills, dizziness, headaches, visual disturbances, fatigue, shortness of breath, chest pain, abdominal pain, vomiting Pertinent History Reviewed:  Reviewed past medical,surgical, social, obstetrical and family history.  Reviewed problem list, medications and allergies. Physical Assessment:   Vitals:   12/30/22 1331  BP: (!) 160/80  Pulse: 67  Weight: 220 lb (99.8 kg)  Height: 5\' 3"   (1.6 m)  Body mass index is 38.97 kg/m.       Physical Examination:   General appearance: alert, well appearing, and in no distress  Psych: mood appropriate, normal affect  Skin: warm & dry   Cardiovascular: normal heart rate noted  Respiratory: normal respiratory effort, no distress  Abdomen: obese, soft, non-tender, no rebound, no guarding  Pelvic: VULVA: normal appearing vulva with no masses, tenderness or lesions, VAGINA: normal appearing vagina with normal color and discharge, no lesions.  Minimal Stage 1 prolapse noted.  With Valsalva good apical support noted.  Uterus and cervix surgically absent.  ADNEXA: no masses or tenderness noted  Extremities: no edema   Chaperone: Celene Squibb    Assessment & Plan:  1) OAB -reviewed that based on exam and history concern for OAB -reviewed conservative management.  Discussed that while HCTZ is likely playing a role not the only factor -discussed medication options- Rx for Myrbetriq and samples given -f/u in 3-91mos  Meds ordered this encounter  Medications   mirabegron ER (MYRBETRIQ) 25 MG TB24 tablet    Sig: Take 1 tablet (25 mg total) by mouth daily.    Dispense:  30 tablet    Refill:  6  Return in about 3 months (around 03/31/2023) for Medication follow up.   Janyth Pupa, DO Attending [place], [facility] for [employer], [place]

## 2022-12-31 ENCOUNTER — Other Ambulatory Visit (INDEPENDENT_AMBULATORY_CARE_PROVIDER_SITE_OTHER): Payer: Self-pay | Admitting: Gastroenterology

## 2022-12-31 DIAGNOSIS — K219 Gastro-esophageal reflux disease without esophagitis: Secondary | ICD-10-CM

## 2023-01-12 ENCOUNTER — Ambulatory Visit: Payer: Medicare Other | Admitting: Nutrition

## 2023-01-27 ENCOUNTER — Ambulatory Visit (HOSPITAL_COMMUNITY): Payer: Medicare Other | Admitting: Clinical

## 2023-01-27 DIAGNOSIS — F4321 Adjustment disorder with depressed mood: Secondary | ICD-10-CM

## 2023-01-27 DIAGNOSIS — F321 Major depressive disorder, single episode, moderate: Secondary | ICD-10-CM

## 2023-01-27 DIAGNOSIS — F4322 Adjustment disorder with anxiety: Secondary | ICD-10-CM | POA: Diagnosis not present

## 2023-01-27 NOTE — Progress Notes (Signed)
IN PERSON  I connected with Jessica Kirby on 01/27/23 at  9:00 AM EDT in person and verified that I am speaking with the correct person using two identifiers.  Location: Patient: Office Provider: Office   I discussed the limitations of evaluation and management by telemedicine and the availability of in person appointments. The patient expressed understanding and agreed to proceed. (IN PERSON)  THERAPIST PROGRESS NOTE   Session Time: 9:00 AM-9:45 AM   Participation Level: Active   Behavioral Response: CasualAlertDepressed   Type of Therapy: Individual Therapy   Treatment Goals addressed: Depression and Greif   Interventions: CBT, Motivational Interviewing, Solution Focused and Strength-based   Summary: Jessica Kirby is a 67 y.o. female who presents with Depression /Adjustments Disorder/ unresolved grief. The OPT therapist worked with the patient for her ongoing OPT treatment. The OPT therapist utilized Motivational Interviewing to assist in creating therapeutic repore. The patient in the session was engaged and work in collaboration giving feedback about her triggers and symptoms over the past few weeks. The patient spoke about recent life change noting her son was living with her until around 3 weeks ago, when they got into an argument and the sheriff office responded to the home and the patients son left the residence with a friend. The patients acknowledged her son living with her has been a stressor and they would be in conflict often. The patient verbalized since her son left she has felt relief and her day to day functioning has improved.The OPT therapist utilized Cognitive Behavioral Therapy through cognitive restructuring as well as worked with the patient on coping strategies to assist in management of mood and being active. The OPT therapist promoted the patients work to challenge automatic negative thinking and promoted positive mindset.The OPT therapist worked with the  patient providing support and psycho-education. The patient spoke about focusing on herself and getting healthy. The patient is considering going back to the gym on a routine basis. The patient overviewed her upcoming health appointments as listed in patients MyChart.   Suicidal/Homicidal: Nowithout intent/plan   Therapist Response: The OPT therapist worked with the patient for the patients scheduled session. The patient was engaged in her session and gave feedback in relation to triggers, symptoms, and behavior responses over the past few weeks. The patient shared life event involving her son leaving the home after a conflict led to law enforcement intervention. The OPT therapist worked with the patient utilizing an in session Cognitive Behavioral Therapy exercise. The patient was responsive in the session and verbalized, "I have been able to sleep better and I have not been as stressed out since he left and I know now he is a grown adult and I have to focus on myself and my own health and I will not let him come back and live with me". The patient spoke about her plans to be active getting back into the gym and going out to dinner with neighbors for socialization, working to be more aware of her baseline and working to get back to her baseline and maintaining this with consistency. The OPT therapist worked with the patient reviewing coping strategies and promoting on going self check-ins. The patient spoke about willingness to try journaling. The patient will continue to work on her physical health and attend all scheduled health appointments. The OPT therapist will continue treatment work with the patient in her next scheduled session.   Plan: Return again in 2/3 weeks.   Diagnosis:  Axis I: Major depressive disorder, single episode, moderate / Unresolved grief/ Adjustment Disorder with anxious mood                           Axis II: No diagnosis   Collaboration of Care: Collaboration of care for  this session review of med therapy with Dr. Tenny Craw.   Patient/Guardian was advised Release of Information must be obtained prior to any record release in order to collaborate their care with an outside provider. Patient/Guardian was advised if they have not already done so to contact the registration department to sign all necessary forms in order for [place] to release information regarding their care.    Consent: Patient/Guardian gives verbal consent for treatment and assignment of benefits for services provided during this visit. Patient/Guardian expressed understanding and agreed to proceed.       I discussed the assessment and treatment plan with the patient. The patient was provided an opportunity to ask questions and all were answered. The patient agreed with the plan and demonstrated an understanding of the instructions.   The patient was advised to call back or seek an in-person evaluation if the symptoms worsen or if the condition fails to improve as anticipated.   I provided 45 minutes of face-to-face time during this encounter.   Suzan Garibaldi, LCSW   01/27/2023

## 2023-02-02 ENCOUNTER — Other Ambulatory Visit: Payer: Self-pay

## 2023-02-02 ENCOUNTER — Emergency Department (HOSPITAL_COMMUNITY): Payer: Medicare Other

## 2023-02-02 ENCOUNTER — Emergency Department (HOSPITAL_COMMUNITY)
Admission: EM | Admit: 2023-02-02 | Discharge: 2023-02-02 | Disposition: A | Discharge status: Home / Self Care | Payer: Medicare Other | Attending: Emergency Medicine | Admitting: Emergency Medicine

## 2023-02-02 DIAGNOSIS — R0602 Shortness of breath: Secondary | ICD-10-CM | POA: Diagnosis not present

## 2023-02-02 DIAGNOSIS — R490 Dysphonia: Secondary | ICD-10-CM | POA: Diagnosis present

## 2023-02-02 DIAGNOSIS — R059 Cough, unspecified: Secondary | ICD-10-CM | POA: Diagnosis not present

## 2023-02-02 DIAGNOSIS — J040 Acute laryngitis: Secondary | ICD-10-CM | POA: Insufficient documentation

## 2023-02-02 MED ORDER — PREDNISONE 50 MG PO TABS
60.0000 mg | ORAL_TABLET | Freq: Once | ORAL | Status: AC
  Administered 2023-02-02: 60 mg via ORAL
  Filled 2023-02-02: qty 1

## 2023-02-02 MED ORDER — PREDNISONE 20 MG PO TABS: ORAL_TABLET | ORAL | 0 refills | Status: DC

## 2023-02-02 NOTE — ED Triage Notes (Signed)
Pt states started feeling hoarse yesterday and now feels like she is having she is having trouble breathing. Reports she feels like she is congested but cannot cough anything up.

## 2023-02-03 NOTE — ED Provider Notes (Signed)
[place] EMERGENCY DEPARTMENT AT [facility] Provider Note   CSN: [id number] Arrival date & time: 02/02/23  0341     History  Chief Complaint  Patient presents with   Hoarse   Shortness of Breath    Jessica Kirby is a 67 y.o. female.  Here with hoarse voice of unclear etiology. No recent illnesses. Did start losartan a couple months ago. Has had a dry cough for a few days. Feels short of breath, but doesn't have any noisy breathing. No fevers.    Shortness of Breath      Home Medications Prior to Admission medications   Medication Sig Start Date End Date Taking? Authorizing Provider  predniSONE (DELTASONE) 20 MG tablet 2 tabs po daily x 4 days 02/03/23  Yes Camauri Fleece, Barbara Cower, MD  Accu-Chek Softclix Lancets lancets Use to check blood sugars daily before meals E11.9 05/08/22   Cook, Verdis Frederickson, DO  ALPRAZolam (XANAX) 0.5 MG tablet TAKE 1 TABLET(0.5 MG) BY MOUTH DAILY AS NEEDED FOR ANXIETY 11/25/22   Tommie Sams, DO  aspirin EC 81 MG tablet Take 81 mg by mouth daily.     [provider]  blood glucose meter kit and supplies Dispense based on patient and insurance preference. Use to test glucose twice daily  (FOR ICD-10,  E11.9). 12/03/20   Laroy Apple M, DO  Calcium Carbonate-Vitamin D (CALCIUM-VITAMIN D3 PO) Take 1 tablet by mouth daily.    [provider]  cetirizine (ZYRTEC ALLERGY) 10 MG tablet Take 1 tablet (10 mg total) by mouth daily. 08/18/22   Ameduite, Alvino Chapel, FNP  fluticasone (FLONASE) 50 MCG/ACT nasal spray Place 2 sprays into both nostrils daily. 08/18/22   Ameduite, Alvino Chapel, FNP  glucose blood test strip Use to check blood sugars daily before meals E11.9 05/08/22   Everlene Other G, DO  hydrochlorothiazide (HYDRODIURIL) 25 MG tablet TAKE 1 TABLET(25 MG) BY MOUTH DAILY 09/24/22   Everlene Other G, DO  metoprolol succinate (TOPROL-XL) 25 MG 24 hr tablet Take 1 tablet (25 mg total) by mouth daily. 07/21/22   Hilty, Lisette Abu, MD  mirabegron ER  (MYRBETRIQ) 25 MG TB24 tablet Take 1 tablet (25 mg total) by mouth daily. 12/30/22 01/29/23  Myna Hidalgo, DO  Multiple Vitamins-Minerals (CENTRUM SILVER PO) Take 1 tablet by mouth daily.    [provider]  pantoprazole (PROTONIX) 20 MG tablet TAKE 1 TABLET(20 MG) BY MOUTH DAILY 12/31/22   Carlan, Chelsea L, NP  polyethylene glycol (MIRALAX / GLYCOLAX) 17 g packet Take 17 g by mouth daily.    [provider]  rosuvastatin (CRESTOR) 10 MG tablet Take 1 tablet (10 mg total) by mouth daily. 04/08/22   Ameduite, Alvino Chapel, FNP      Allergies    Fosamax [alendronate sodium], Vioxx [rofecoxib], Losartan, and Sulfa antibiotics    Review of Systems   Review of Systems  Respiratory:  Positive for shortness of breath.     Physical Exam Updated Vital Signs BP 124/65   Pulse 74   Temp 98.5 F (36.9 C) (Oral)   Resp 13   Ht 5\' 3"  (1.6 m)   Wt 102.1 kg   SpO2 94%   BMI 39.86 kg/m  Physical Exam Vitals and nursing note reviewed.  Constitutional:      Appearance: She is well-developed.  HENT:     Head: Normocephalic and atraumatic.     Comments: Hoarse voice without oral edema or neck abnormalities. Lungs clear. Cardiovascular:  Rate and Rhythm: Normal rate and regular rhythm.  Pulmonary:     Effort: No respiratory distress.     Breath sounds: No stridor.  Abdominal:     General: There is no distension.  Musculoskeletal:     Cervical back: Normal range of motion.  Neurological:     Mental Status: She is alert.     ED Results / Procedures / Treatments   Labs (all labs ordered are listed, but only abnormal results are displayed) Labs Reviewed - No data to display  EKG None  Radiology DG Neck Soft Tissue  Result Date: 02/02/2023 CLINICAL DATA:  Hoarse voice and cough.  Shortness of breath. EXAM: CHEST - 2 VIEW; NECK SOFT TISSUES - 1+ VIEW COMPARISON:  02/03/2022. FINDINGS: The heart is enlarged and the mediastinal contour is within normal limits. No  consolidation, effusion, or pneumothorax. Degenerative changes are noted in the thoracic spine. No acute osseous abnormality. The epiglottis, aryepiglottic folds and cervical airway are within normal limits. No radiopaque foreign body is identified. No prevertebral soft tissue swelling is seen. Degenerative changes are noted in the cervical spine from C3-C7. IMPRESSION: 1. No active cardiopulmonary disease. 2. No abnormality in the neck soft tissues to explain reported symptoms. Electronically Signed   By: Thornell Sartorius M.D.   On: 02/02/2023 04:44   DG Chest 2 View  Result Date: 02/02/2023 CLINICAL DATA:  Hoarse voice and cough.  Shortness of breath. EXAM: CHEST - 2 VIEW; NECK SOFT TISSUES - 1+ VIEW COMPARISON:  02/03/2022. FINDINGS: The heart is enlarged and the mediastinal contour is within normal limits. No consolidation, effusion, or pneumothorax. Degenerative changes are noted in the thoracic spine. No acute osseous abnormality. The epiglottis, aryepiglottic folds and cervical airway are within normal limits. No radiopaque foreign body is identified. No prevertebral soft tissue swelling is seen. Degenerative changes are noted in the cervical spine from C3-C7. IMPRESSION: 1. No active cardiopulmonary disease. 2. No abnormality in the neck soft tissues to explain reported symptoms. Electronically Signed   By: Thornell Sartorius M.D.   On: 02/02/2023 04:44    Procedures Procedures    Medications Ordered in ED Medications  predniSONE (DELTASONE) tablet 60 mg (60 mg Oral Given 02/02/23 0420)    ED Course/ Medical Decision Making/ A&P                             Medical Decision Making Amount and/or Complexity of Data Reviewed Radiology: ordered.  Risk Prescription drug management.   Xr's negative on my interpretation, specifically no e/o RPA, epiglotittis or impinged airway. Observed for 3 hours in ED without change in voice or normal VS. No distress. Not hypoxic. No stridor. Possible from  coughing maybe related to the ARB. Will stop the ARB, continue steroids and return if any worsening breathign or stridor, otherwise follow up with PCP for further management of her BP meds.   Final Clinical Impression(s) / ED Diagnoses Final diagnoses:  Laryngitis    Rx / DC Orders ED Discharge Orders          Ordered    predniSONE (DELTASONE) 20 MG tablet        02/02/23 0639              Aadvika Konen, Barbara Cower, MD 02/03/23 [id number]

## 2023-02-05 ENCOUNTER — Ambulatory Visit (INDEPENDENT_AMBULATORY_CARE_PROVIDER_SITE_OTHER): Payer: Medicare Other | Admitting: Family Medicine

## 2023-02-05 VITALS — BP 139/89 | Ht 63.0 in | Wt 218.8 lb

## 2023-02-05 DIAGNOSIS — J040 Acute laryngitis: Secondary | ICD-10-CM | POA: Insufficient documentation

## 2023-02-05 NOTE — Assessment & Plan Note (Addendum)
I do not believe that the ARB is contributing.  I believe that this is simple viral laryngitis.  Advised voice rest and supportive care. I advised the patient that she can stop the prednisone.

## 2023-02-05 NOTE — Progress Notes (Signed)
Subjective:  Patient ID: Jessica Kirby, female    DOB: 11/29/1955  Age: 67 y.o. MRN: [id number]  CC: Chief Complaint  Patient presents with   ER follow up    Lost voice and the ER said to hold blood pressure med because that could have done it or reflux- gave her prednisone    HPI:  67 year old female presents for follow-up.  Patient recently seen in the ER for hoarseness/laryngitis.  Had a negative workup.  Was placed on prednisone.  ER physician thought that ARB could be contributing.  Patient's had some sinus congestion.  No fever.  No other associated symptoms.  No other complaints.  Patient Active Problem List   Diagnosis Date Noted   Laryngitis 02/05/2023   Proteinuria 12/09/2022   Chronic idiopathic constipation 10/15/2022   Hyperlipidemia 03/25/2022   Diabetes mellitus without complication (HCC) 11/28/2020   Anxiety 11/04/2015   GERD (gastroesophageal reflux disease) 09/27/2013   Hypertension    Osteopenia     Social Hx   Social History   Socioeconomic History   Marital status: Widowed    Spouse name: Not on file   Number of children: Not on file   Years of education: Not on file   Highest education level: Not on file  Occupational History   Not on file  Tobacco Use   Smoking status: Never    Passive exposure: Yes   Smokeless tobacco: Never  Vaping Use   Vaping Use: Never used  Substance and Sexual Activity   Alcohol use: No    Alcohol/week: 0.0 standard drinks of alcohol   Drug use: No   Sexual activity: Yes  Other Topics Concern   Not on file  Social History Narrative   Not on file   Social Determinants of Health   Financial Resource Strain: Low Risk  (12/30/2022)   Overall Financial Resource Strain (CARDIA)    Difficulty of Paying Living Expenses: Not very hard  Food Insecurity: No Food Insecurity (12/30/2022)   Hunger Vital Sign    Worried About Running Out of Food in the Last Year: Never true    Ran Out of Food in the Last Year: Never  true  Transportation Needs: No Transportation Needs (12/30/2022)   PRAPARE - Administrator, Civil Service (Medical): No    Lack of Transportation (Non-Medical): No  Physical Activity: Insufficiently Active (12/30/2022)   Exercise Vital Sign    Days of Exercise per Week: 4 days    Minutes of Exercise per Session: 30 min  Stress: No Stress Concern Present (12/30/2022)   [employer] of Occupational Health - Occupational Stress Questionnaire    Feeling of Stress : Only a little  Social Connections: Moderately Integrated (12/30/2022)   Social Connection and Isolation Panel [NHANES]    Frequency of Communication with Friends and Family: More than three times a week    Frequency of Social Gatherings with Friends and Family: More than three times a week    Attends Religious Services: More than 4 times per year    Active Member of [employer] or Organizations: Yes    Attends Banker Meetings: More than 4 times per year    Marital Status: Widowed    Review of Systems Per HPI  Objective:  BP 139/89   Ht 5\' 3"  (1.6 m)   Wt 218 lb 12.8 oz (99.2 kg)   BMI 38.76 kg/m      02/05/2023    3:43 PM 02/02/2023  6:30 AM 02/02/2023    3:52 AM  BP/Weight  Systolic BP 139 124 152  Diastolic BP 89 65 75  Wt. (Lbs) 218.8    BMI 38.76 kg/m2      Physical Exam Vitals and nursing note reviewed.  Constitutional:      General: She is not in acute distress.    Appearance: Normal appearance.  HENT:     Head: Normocephalic and atraumatic.     Mouth/Throat:     Pharynx: Oropharynx is clear.  Cardiovascular:     Rate and Rhythm: Normal rate and regular rhythm.  Pulmonary:     Effort: Pulmonary effort is normal.     Breath sounds: Normal breath sounds.  Neurological:     Mental Status: She is alert.     Lab Results  Component Value Date   WBC 10.5 11/24/2022   HGB 13.9 11/24/2022   HCT 41.8 11/24/2022   PLT 287 11/24/2022   GLUCOSE 110 (H) 12/23/2022   CHOL 142  11/24/2022   TRIG 132 11/24/2022   HDL 53 11/24/2022   LDLCALC 66 11/24/2022   ALT 27 11/24/2022   AST 24 11/24/2022   NA 143 12/23/2022   K 4.3 12/23/2022   CL 103 12/23/2022   CREATININE 0.74 12/23/2022   BUN 8 12/23/2022   CO2 24 12/23/2022   TSH 2.820 06/10/2022   INR 0.9 02/03/2022   HGBA1C 6.8 (H) 11/24/2022     Assessment & Plan:   Problem List Items Addressed This Visit       Respiratory   Laryngitis - Primary    I do not believe that the ARB is contributing.  I believe that this is simple viral laryngitis.  Advised voice rest and supportive care. I advised the patient that she can stop the prednisone.      Everlene Other DO [facility]

## 2023-02-05 NOTE — Patient Instructions (Signed)
Rest. Fluids.  Voice rest.  If continues to persist, please let me know.  Stop prednisone.

## 2023-02-08 ENCOUNTER — Telehealth: Payer: Self-pay

## 2023-02-08 ENCOUNTER — Other Ambulatory Visit: Payer: Self-pay | Admitting: Family Medicine

## 2023-02-08 MED ORDER — BENZONATATE 200 MG PO CAPS: 200.0000 mg | ORAL_CAPSULE | Freq: Three times a day (TID) | ORAL | 0 refills | Status: DC | PRN

## 2023-02-08 MED ORDER — AMOXICILLIN-POT CLAVULANATE 875-125 MG PO TABS
1.0000 | ORAL_TABLET | Freq: Two times a day (BID) | ORAL | 0 refills | Status: DC
Start: 2023-02-08 — End: 2023-02-17

## 2023-02-08 NOTE — Telephone Encounter (Signed)
Pt seen Dr Adriana Simas for HFU last week told patient if she was not feeling better at the beginning of the week to call and he would call a medication in for the patient. Pt is still having a cough a lot  and congestion head starting to hurt behind the ears and needs something called into the pharmacy [facility] on [address]  Note PT has High Blood Pressure what kind of cough syrup can patient take   Rus call back [phone]

## 2023-02-09 NOTE — Telephone Encounter (Signed)
Jessica Sams, DO     I sent in antibiotic and something for her cough.

## 2023-02-09 NOTE — Telephone Encounter (Signed)
Patient notified and stated she picked up medication from pharmacy

## 2023-02-10 ENCOUNTER — Ambulatory Visit: Payer: Medicare Other | Admitting: Family Medicine

## 2023-02-11 DIAGNOSIS — M79675 Pain in left toe(s): Secondary | ICD-10-CM | POA: Diagnosis not present

## 2023-02-11 DIAGNOSIS — M79674 Pain in right toe(s): Secondary | ICD-10-CM | POA: Diagnosis not present

## 2023-02-11 DIAGNOSIS — M79672 Pain in left foot: Secondary | ICD-10-CM | POA: Diagnosis not present

## 2023-02-11 DIAGNOSIS — M79671 Pain in right foot: Secondary | ICD-10-CM | POA: Diagnosis not present

## 2023-02-11 DIAGNOSIS — E1140 Type 2 diabetes mellitus with diabetic neuropathy, unspecified: Secondary | ICD-10-CM | POA: Diagnosis not present

## 2023-02-11 DIAGNOSIS — L110 Acquired keratosis follicularis: Secondary | ICD-10-CM | POA: Diagnosis not present

## 2023-02-16 NOTE — Progress Notes (Unsigned)
GI Office Note    Referring Provider: Tommie Sams, DO Primary Care Physician:  Tommie Sams, DO Primary Gastroenterologist: Dolores Frame, MD  Date:  02/17/2023  ID:  Jessica Kirby, DOB Jan 27, 1956, MRN [id number]   Chief Complaint   Chief Complaint  Patient presents with   Follow-up    Swallowing is better, but she is hoarse    History of Present Illness  [name] is a 67 y.o. female with a history of constipation, GERD, diabetes, hypothyroidism, anxiety, depression presenting today with complaint of dysphagia.  Last colonoscopy in 2017: -Sigmoid and descending diverticulosis  Last office visit 10/15/2022.  Noted decrease in regurgitation issues since she came off potassium pills continue Protonix.  Previously takes this about twice per week when she eats something she knows she should not.  Has heartburn symptoms occasionally.  Denies any dysphagia or odynophagia, nausea vomiting, early satiety.  Tries to avoid spicy foods, caffeine, greasy foods.  Struggling with some constipation during a bowel movement once a day but occasionally she may skip a day.  Feels that this may be related to HCTZ.  Stools hard at times, takes MiraLAX as needed but makes her feel bloated.  Has tried increasing fiber in diet.  Occasionally has to strain. Advised PPI as needed and reflux precautions.  Start Benefiber 1 tablespoon twice daily with meals and for 64 ounces of water daily and increase fiber in her diet.   Today:  Dysphagia - Feels as though at night the powders from her medications are irritating her throat. She takes all of her mediations during the day but if she takes anything at night it is bothering her throat. Drinking lots of water. She states she feels a coating on her throat. The last couple of days she has taken her PPI daily instead of a few times a week. Does not have pain or hoarseness after eating solids. Only seems to have issues with medications and the  coughing occurs at night or around 4-5 pm. She feels as though the cough works it way up.   Will take her multivitamin with applesauce.   A couple of weeks ago she went to the ED and had a chest xray and CT scan and it was negative. At times feels as though she is strangling and has a dry cough.  She reports her stools have been a lighter color but are normally a brown color. Stools are hard usually. Takes miralax as needed.     Current Outpatient Medications  Medication Sig Dispense Refill   Accu-Chek Softclix Lancets lancets Use to check blood sugars daily before meals E11.9 100 each 7   ALPRAZolam (XANAX) 0.5 MG tablet TAKE 1 TABLET(0.5 MG) BY MOUTH DAILY AS NEEDED FOR ANXIETY 30 tablet 0   aspirin EC 81 MG tablet Take 81 mg by mouth daily.      blood glucose meter kit and supplies Dispense based on patient and insurance preference. Use to test glucose twice daily  (FOR ICD-10,  E11.9). 1 each 5   Calcium Carbonate-Vitamin D (CALCIUM-VITAMIN D3 PO) Take 1 tablet by mouth daily.     fluticasone (FLONASE) 50 MCG/ACT nasal spray Place 2 sprays into both nostrils daily. 16 g 6   glucose blood test strip Use to check blood sugars daily before meals E11.9 100 each 7   metoprolol succinate (TOPROL-XL) 25 MG 24 hr tablet Take 1 tablet (25 mg total) by mouth daily. 90 tablet 3   Multiple  Vitamins-Minerals (CENTRUM SILVER PO) Take 1 tablet by mouth daily.     pantoprazole (PROTONIX) 20 MG tablet TAKE 1 TABLET(20 MG) BY MOUTH DAILY 90 tablet 3   polyethylene glycol (MIRALAX / GLYCOLAX) 17 g packet Take 17 g by mouth daily.     rosuvastatin (CRESTOR) 10 MG tablet Take 1 tablet (10 mg total) by mouth daily. 90 tablet 3   valsartan (DIOVAN) 40 MG tablet Take 40 mg by mouth daily.     amoxicillin-clavulanate (AUGMENTIN) 875-125 MG tablet Take 1 tablet by mouth 2 (two) times daily. (Patient not taking: Reported on 02/17/2023) 14 tablet 0   benzonatate (TESSALON) 200 MG capsule Take 1 capsule (200 mg  total) by mouth 3 (three) times daily as needed for cough. (Patient not taking: Reported on 02/17/2023) 30 capsule 0   cetirizine (ZYRTEC ALLERGY) 10 MG tablet Take 1 tablet (10 mg total) by mouth daily. (Patient not taking: Reported on 02/17/2023) 30 tablet 1   hydrochlorothiazide (HYDRODIURIL) 25 MG tablet TAKE 1 TABLET(25 MG) BY MOUTH DAILY (Patient not taking: Reported on 02/17/2023) 90 tablet 1   mirabegron ER (MYRBETRIQ) 25 MG TB24 tablet Take 1 tablet (25 mg total) by mouth daily. 30 tablet 6   predniSONE (DELTASONE) 20 MG tablet 2 tabs po daily x 4 days (Patient not taking: Reported on 02/17/2023) 8 tablet 0   No current facility-administered medications for this visit.   Past Medical History:  Diagnosis Date   Allergy    Anxiety    Insomnia   Depression    Insomnia   Diabetes mellitus without complication (HCC) 11/28/2020   GERD (gastroesophageal reflux disease)    Herniated disc    Hypertension    Hypothyroidism    not medicated at this time   Osteoporosis    Thyroid disease    Hypothyroidism    Past Surgical History:  Procedure Laterality Date   ABDOMINAL HYSTERECTOMY     COLONOSCOPY N/A 04/07/2016   Procedure: COLONOSCOPY;  Surgeon: Corbin Ade, MD;  Location: [facility]ITE;  Service: Endoscopy;  Laterality: N/A;  8:30 AM   LEFT HEART CATH AND CORONARY ANGIOGRAPHY N/A 07/01/2022   Procedure: LEFT HEART CATH AND CORONARY ANGIOGRAPHY;  Surgeon: Lennette Bihari, MD;  Location: [facility] INVASIVE CV LAB;  Service: Cardiovascular;  Laterality: N/A;   TUBAL LIGATION     WRIST SURGERY      Family History  Problem Relation Age of Onset   Diabetes Father    Heart attack Mother    Heart disease Mother    Bladder Cancer Other    Colon cancer Neg Hx     Allergies as of 02/17/2023 - Review Complete 02/17/2023  Allergen Reaction Noted   Fosamax [alendronate sodium] Other (See Comments) 12/21/2012   Vioxx [rofecoxib]  12/21/2012   Losartan Cough 06/13/2015   Sulfa antibiotics  Hives 09/10/2011    Social History   Socioeconomic History   Marital status: Widowed    Spouse name: Not on file   Number of children: Not on file   Years of education: Not on file   Highest education level: Not on file  Occupational History   Not on file  Tobacco Use   Smoking status: Never    Passive exposure: Yes   Smokeless tobacco: Never  Vaping Use   Vaping Use: Never used  Substance and Sexual Activity   Alcohol use: No    Alcohol/week: 0.0 standard drinks of alcohol   Drug use: No   Sexual  activity: Yes  Other Topics Concern   Not on file  Social History Narrative   Not on file   Social Determinants of Health   Financial Resource Strain: Low Risk  (12/30/2022)   Overall Financial Resource Strain (CARDIA)    Difficulty of Paying Living Expenses: Not very hard  Food Insecurity: No Food Insecurity (12/30/2022)   Hunger Vital Sign    Worried About Running Out of Food in the Last Year: Never true    Ran Out of Food in the Last Year: Never true  Transportation Needs: No Transportation Needs (12/30/2022)   PRAPARE - Administrator, Civil Service (Medical): No    Lack of Transportation (Non-Medical): No  Physical Activity: Insufficiently Active (12/30/2022)   Exercise Vital Sign    Days of Exercise per Week: 4 days    Minutes of Exercise per Session: 30 min  Stress: No Stress Concern Present (12/30/2022)   [employer] of Occupational Health - Occupational Stress Questionnaire    Feeling of Stress : Only a little  Social Connections: Moderately Integrated (12/30/2022)   Social Connection and Isolation Panel [NHANES]    Frequency of Communication with Friends and Family: More than three times a week    Frequency of Social Gatherings with Friends and Family: More than three times a week    Attends Religious Services: More than 4 times per year    Active Member of [employer] or Organizations: Yes    Attends Banker Meetings: More than 4 times per year     Marital Status: Widowed     Review of Systems   Gen: Denies fever, chills, anorexia. Denies fatigue, weakness, weight loss.  CV: Denies chest pain, palpitations, syncope, peripheral edema, and claudication. Resp: Denies dyspnea at rest, cough, wheezing, coughing up blood, and pleurisy. GI: See HPI Derm: Denies rash, itching, dry skin Psych: Denies depression, anxiety, memory loss, confusion. No homicidal or suicidal ideation.  Heme: Denies bruising, bleeding, and enlarged lymph nodes.   Physical Exam   BP (!) 143/86   Pulse 71   Temp 97.8 F (36.6 C)   Ht 5\' 2"  (1.575 m)   Wt 217 lb 9.6 oz (98.7 kg)   BMI 39.80 kg/m   General:   Alert and oriented. No distress noted. Pleasant and cooperative.  Head:  Normocephalic and atraumatic. Eyes:  Conjuctiva clear without scleral icterus. Mouth:  Oral mucosa pink and moist. Good dentition. No lesions. Lungs:  Clear to auscultation bilaterally. No wheezes, rales, or rhonchi. No distress.  Heart:  S1, S2 present without murmurs appreciated.  Abdomen:  +BS, soft, non-tender and non-distended. No rebound or guarding. No HSM or masses noted. Rectal: deferred Msk:  Symmetrical without gross deformities. Normal posture. Extremities:  non pitting bilateral lower extremity edema.  Neurologic:  Alert and  oriented x4 Psych:  Alert and cooperative. Normal mood and affect.   Assessment  Jessica Kirby is a 67 y.o. female with a history of constipation, GERD, diabetes, hypothyroidism, anxiety, depression presenting today with complaint of dysphagia.  GERD, Dysphagia, voice hoarseness, cough: No overt dysphagia except with medications at times. Sometimes requires applesauce to take larger pills or tablets with a coating. Recent chest xray and CT soft tissue negative.  Intermittent dry cough likely secondary to medication effect given start of recent ARB and timing of symptom onset however patient feels as though this is more of a GI issue  given the cough. Within the last few days has been more  consistent with her PPI which she previously took as needed given she does not have any overt reflux. In order to rule out esophagitis from uncontrolled reflux we will continue consistent use of PPI and trial course of carafate to coat the esophagus to help with mild burning that occurs. If cough continues despite this treatment I discussed with her that she will need to discuss with her primary care an alternative BP agent.    Constipation: Having an almost daily BM but continues to have hard stools. Denies straining. Not using miralax frequently. Reinforced fiber diet and supplementation and encouraged to start daily stool softener.   PLAN   Carafate 1g TID for 2 weeks.  Continue pantoprazole 20 mg once daily Use applesauce to take medications May need to consider stopping valsartan Start Stool softener daily Continue miralax as needed.  High fiber diet, supplement daily with benefiber if no improvement Follow up in 6 weeks.     Brooke Bonito, MSN, FNP-BC, AGACNP-BC [facility]  I have reviewed the note and agree with the APP's assessment as described in this progress note  Katrinka Blazing, MD Gastroenterology and Hepatology [facility]

## 2023-02-17 ENCOUNTER — Encounter: Payer: Self-pay | Admitting: Gastroenterology

## 2023-02-17 ENCOUNTER — Ambulatory Visit: Payer: Medicare Other | Admitting: Gastroenterology

## 2023-02-17 VITALS — BP 143/86 | HR 71 | Temp 97.8°F | Ht 62.0 in | Wt 217.6 lb

## 2023-02-17 DIAGNOSIS — K219 Gastro-esophageal reflux disease without esophagitis: Secondary | ICD-10-CM | POA: Diagnosis not present

## 2023-02-17 DIAGNOSIS — R1310 Dysphagia, unspecified: Secondary | ICD-10-CM

## 2023-02-17 DIAGNOSIS — R058 Other specified cough: Secondary | ICD-10-CM | POA: Diagnosis not present

## 2023-02-17 DIAGNOSIS — K5904 Chronic idiopathic constipation: Secondary | ICD-10-CM

## 2023-02-17 DIAGNOSIS — R490 Dysphonia: Secondary | ICD-10-CM | POA: Diagnosis not present

## 2023-02-17 MED ORDER — SUCRALFATE 1 GM/10ML PO SUSP: 1.0000 g | Freq: Three times a day (TID) | ORAL | 0 refills | Status: DC

## 2023-02-17 NOTE — Patient Instructions (Addendum)
Continue to take pantoprazole 20 mg once daily.  I am going to send in Carafate for you and you to take 1 g 3 times a day for 2 weeks.  I am sending in the suspension but if there is an issue with cost please let me know and I will send in tablets as this is usually cheaper.  If you obtain the tablets you will let this dissolve in 1-2 ounces of water and drink as a slurry.  Please start taking your medications with a small amount of applesauce to ensure your medications are adequately getting to your stomach and not causing irritation within your esophagus.  Continue to take MiraLAX as needed and if you are having hard stools I would you to start a daily stool softener such as Colace (docusate sodium).   Continue increasing fiber in your diet.  You may benefit from supplementing your diet with Benefiber 1 tablespoon daily.  If all of these interventions are not helpful with your cough and voice hoarseness then you may need to consider talking with your primary care to stop valsartan and trial different blood pressure medication as this is a common side effect of this type of medication.  We will plan to follow-up in 6 weeks to see if you are still having issues.  Please reach out to me via phone call or MyChart message if you do not see an improvement within the next 3-4 weeks.  It was a pleasure to see you today. I want to create trusting relationships with patients. If you receive a survey regarding your visit,  I greatly appreciate you taking time to fill this out on paper or through your MyChart. I value your feedback.  Brooke Bonito, MSN, FNP-BC, AGACNP-BC [facility]

## 2023-02-21 ENCOUNTER — Encounter (HOSPITAL_COMMUNITY): Payer: Self-pay | Admitting: Emergency Medicine

## 2023-02-21 ENCOUNTER — Emergency Department (HOSPITAL_COMMUNITY)
Admission: EM | Admit: 2023-02-21 | Discharge: 2023-02-22 | Disposition: A | Discharge status: Home / Self Care | Payer: Medicare Other | Attending: Emergency Medicine | Admitting: Emergency Medicine

## 2023-02-21 ENCOUNTER — Other Ambulatory Visit: Payer: Self-pay

## 2023-02-21 ENCOUNTER — Emergency Department (HOSPITAL_COMMUNITY): Payer: Medicare Other

## 2023-02-21 DIAGNOSIS — E876 Hypokalemia: Secondary | ICD-10-CM | POA: Insufficient documentation

## 2023-02-21 DIAGNOSIS — R0789 Other chest pain: Secondary | ICD-10-CM | POA: Insufficient documentation

## 2023-02-21 DIAGNOSIS — R079 Chest pain, unspecified: Secondary | ICD-10-CM | POA: Diagnosis not present

## 2023-02-21 LAB — CBC
HCT: 37.6 % (ref 36.0–46.0)
Hemoglobin: 12.7 g/dL (ref 12.0–15.0)
MCH: 28.9 pg (ref 26.0–34.0)
MCHC: 33.8 g/dL (ref 30.0–36.0)
MCV: 85.5 fL (ref 80.0–100.0)
Platelets: 264 10*3/uL (ref 150–400)
RBC: 4.4 MIL/uL (ref 3.87–5.11)
RDW: 12 % (ref 11.5–15.5)
WBC: 10.7 10*3/uL — ABNORMAL HIGH (ref 4.0–10.5)
nRBC: 0 % (ref 0.0–0.2)

## 2023-02-21 LAB — BASIC METABOLIC PANEL
Anion gap: 14 (ref 5–15)
BUN: 9 mg/dL (ref 8–23)
CO2: 24 mmol/L (ref 22–32)
Calcium: 8.8 mg/dL — ABNORMAL LOW (ref 8.9–10.3)
Chloride: 95 mmol/L — ABNORMAL LOW (ref 98–111)
Creatinine, Ser: 0.71 mg/dL (ref 0.44–1.00)
GFR, Estimated: >60 mL/min (ref >60)
Glucose, Bld: 175 mg/dL — ABNORMAL HIGH (ref 70–99)
Potassium: 2.9 mmol/L — ABNORMAL LOW (ref 3.5–5.1)
Sodium: 133 mmol/L — ABNORMAL LOW (ref 135–145)

## 2023-02-21 LAB — TROPONIN I (HIGH SENSITIVITY): Troponin I (High Sensitivity): 4 ng/L (ref <18)

## 2023-02-21 MED ORDER — POTASSIUM CHLORIDE 10 MEQ PO PACK: 20.0000 meq | PACK | Freq: Two times a day (BID) | ORAL | 0 refills | Status: DC

## 2023-02-21 MED ORDER — POTASSIUM CHLORIDE 10 MEQ/100ML IV SOLN
10.0000 meq | INTRAVENOUS | Status: AC
  Administered 2023-02-22 (×2): 10 meq via INTRAVENOUS
  Filled 2023-02-21 (×2): qty 100

## 2023-02-21 MED ORDER — POTASSIUM CHLORIDE 20 MEQ PO PACK
40.0000 meq | PACK | Freq: Once | ORAL | Status: AC
  Administered 2023-02-22: 40 meq via ORAL
  Filled 2023-02-21: qty 2

## 2023-02-21 MED ORDER — ALUM & MAG HYDROXIDE-SIMETH 200-200-20 MG/5ML PO SUSP
30.0000 mL | Freq: Once | ORAL | Status: AC
  Administered 2023-02-21: 30 mL via ORAL
  Filled 2023-02-21: qty 30

## 2023-02-21 NOTE — Discharge Instructions (Addendum)
We saw you in the ER for the chest pain/shortness of breath. All of our cardiac workup is normal, including labs, EKG and chest X-RAY are normal. The only concerning abnormality is low potassium.  We will be sending you home with potassium prescription.  We are not sure what is causing your discomfort, but we feel comfortable sending you home at this time. The workup in the ER is not complete, and you should follow up with your primary care doctor for further evaluation.

## 2023-02-21 NOTE — ED Provider Notes (Signed)
Jessica Kirby EMERGENCY DEPARTMENT AT [facility] Provider Note   CSN: [id number] Arrival date & time: 02/21/23  2049     History  Chief Complaint  Patient presents with   Gastroesophageal Reflux    Jessica Kirby is a 67 y.o. female.  HPI    67 year old female comes in with chief complaint of chest pain.  Patient has history of GERD, esophagitis and is seeing GI doctors.  Jessica Kirby was placed on Carafate just earlier this week.  Jessica Kirby states that today Jessica Kirby is having right-sided chest pain that is sharp in nature.  Chest pain is different than the GERD pain Jessica Kirby has been experiencing.  Review of system is negative for shortness of breath, nausea, diaphoresis.  Home Medications Prior to Admission medications   Medication Sig Start Date End Date Taking? Authorizing Provider  Potassium Chloride 10 MEQ PACK Take 20 mEq by mouth in the morning and at bedtime. 02/21/23  Yes Derwood Kaplan, MD  Accu-Chek Softclix Lancets lancets Use to check blood sugars daily before meals E11.9 05/08/22   Cook, Verdis Frederickson, DO  ALPRAZolam Prudy Feeler) 0.5 MG tablet TAKE 1 TABLET(0.5 MG) BY MOUTH DAILY AS NEEDED FOR ANXIETY 11/25/22   Tommie Sams, DO  aspirin EC 81 MG tablet Take 81 mg by mouth daily.     [provider]  blood glucose meter kit and supplies Dispense based on patient and insurance preference. Use to test glucose twice daily  (FOR ICD-10,  E11.9). 12/03/20   Laroy Apple M, DO  Calcium Carbonate-Vitamin D (CALCIUM-VITAMIN D3 PO) Take 1 tablet by mouth daily.    [provider]  cetirizine (ZYRTEC ALLERGY) 10 MG tablet Take 1 tablet (10 mg total) by mouth daily. Patient not taking: Reported on 02/17/2023 08/18/22   Ameduite, Alvino Chapel, FNP  fluticasone (FLONASE) 50 MCG/ACT nasal spray Place 2 sprays into both nostrils daily. 08/18/22   Ameduite, Alvino Chapel, FNP  glucose blood test strip Use to check blood sugars daily before meals E11.9 05/08/22   Everlene Other G, DO  hydrochlorothiazide  (HYDRODIURIL) 25 MG tablet TAKE 1 TABLET(25 MG) BY MOUTH DAILY Patient not taking: Reported on 02/17/2023 09/24/22   Tommie Sams, DO  metoprolol succinate (TOPROL-XL) 25 MG 24 hr tablet Take 1 tablet (25 mg total) by mouth daily. 07/21/22   Hilty, Lisette Abu, MD  mirabegron ER (MYRBETRIQ) 25 MG TB24 tablet Take 1 tablet (25 mg total) by mouth daily. 12/30/22 01/29/23  Myna Hidalgo, DO  Multiple Vitamins-Minerals (CENTRUM SILVER PO) Take 1 tablet by mouth daily.    [provider]  pantoprazole (PROTONIX) 20 MG tablet TAKE 1 TABLET(20 MG) BY MOUTH DAILY 12/31/22   Carlan, Chelsea L, NP  polyethylene glycol (MIRALAX / GLYCOLAX) 17 g packet Take 17 g by mouth daily.    [provider]  rosuvastatin (CRESTOR) 10 MG tablet Take 1 tablet (10 mg total) by mouth daily. 04/08/22   Ameduite, Alvino Chapel, FNP  sucralfate (CARAFATE) 1 GM/10ML suspension Take 10 mLs (1 g total) by mouth 3 (three) times daily with meals. 02/17/23   Aida Raider, NP  valsartan (DIOVAN) 40 MG tablet Take 40 mg by mouth daily.    [provider]      Allergies    Fosamax [alendronate sodium], Vioxx [rofecoxib], Losartan, and Sulfa antibiotics    Review of Systems   Review of Systems  All other systems reviewed and are negative.   Physical Exam Updated Vital Signs BP Marland Kitchen)  180/85   Pulse 98   Temp 98.4 F (36.9 C) (Oral)   Resp 20   Ht 5\' 3"  (1.6 m)   Wt 98.4 kg   SpO2 92%   BMI 38.44 kg/m  Physical Exam Vitals and nursing note reviewed.  Constitutional:      Appearance: Jessica Kirby is well-developed.  HENT:     Head: Normocephalic and atraumatic.  Eyes:     Extraocular Movements: Extraocular movements intact.  Cardiovascular:     Rate and Rhythm: Normal rate.  Pulmonary:     Effort: Pulmonary effort is normal.  Musculoskeletal:     Cervical back: Normal range of motion and neck supple.  Skin:    General: Skin is dry.  Neurological:     Mental Status: Jessica Kirby is alert and oriented to person,  place, and time.     ED Results / Procedures / Treatments   Labs (all labs ordered are listed, but only abnormal results are displayed) Labs Reviewed  BASIC METABOLIC PANEL - Abnormal; Notable for the following components:      Result Value   Sodium 133 (*)    Potassium 2.9 (*)    Chloride 95 (*)    Glucose, Bld 175 (*)    Calcium 8.8 (*)    All other components within normal limits  CBC - Abnormal; Notable for the following components:   WBC 10.7 (*)    All other components within normal limits  TROPONIN I (HIGH SENSITIVITY)  TROPONIN I (HIGH SENSITIVITY)    EKG EKG Interpretation  Date/Time:  Sunday Feb 21 2023 22:11:00 EDT Ventricular Rate:  85 PR Interval:  205 QRS Duration: 106 QT Interval:  375 QTC Calculation: 446 R Axis:   40 Text Interpretation: Sinus rhythm Atrial premature complex Borderline repolarization abnormality No acute changes No significant change since last tracing Confirmed by Derwood Kaplan ([id number]) on 02/21/2023 10:21:46 PM  Radiology DG Chest Port 1 View  Result Date: 02/21/2023 CLINICAL DATA:  Chest pain. EXAM: PORTABLE CHEST 1 VIEW COMPARISON:  Feb 02, 2023 FINDINGS: The heart size and mediastinal contours are within normal limits. Mild, diffuse, chronic appearing increased lung markings are seen. There is no evidence of acute infiltrate, pleural effusion or pneumothorax. The visualized skeletal structures are unremarkable. IMPRESSION: Chronic appearing increased lung markings without evidence of acute or active cardiopulmonary disease. Electronically Signed   By: Aram Candela M.D.   On: 02/21/2023 22:54    Procedures Procedures    Medications Ordered in ED Medications  potassium chloride 10 mEq in 100 mL IVPB (has no administration in time range)  potassium chloride (KLOR-CON) packet 40 mEq (has no administration in time range)  alum & mag hydroxide-simeth (MAALOX/MYLANTA) 200-200-20 MG/5ML suspension 30 mL (30 mLs Oral Given 02/21/23  2218)    ED Course/ Medical Decision Making/ A&P                             Medical Decision Making Amount and/or Complexity of Data Reviewed Labs: ordered. Radiology: ordered.  Risk OTC drugs. Prescription drug management.  67 year old female comes in with chief complaint of chest discomfort.  Jessica Kirby has working diagnosis of esophagitis and was started on Carafate by GI doctors recently.  Additionally Jessica Kirby has diabetes, hypertension and hyperlipidemia.  Her chest pain is atypical.  It is right-sided, pressure type pain.  Pain is nonradiating.  Additionally Jessica Kirby indicates that the pain is not similar to her GERD/esophagitis pain.  Differential  diagnosis for this patient includes acute coronary syndrome, chest wall pain, esophagitis, esophageal spasms, gastritis.  No pleuritic component to the pain.  Patient does not have any PE, DVT risk history or risk factors.  Plan is to get delta troponin for hear score of 3.  EKG is reassuring.  Have reviewed patient's chart, including GI visits and primary care doctor visits.  I have independently interpreted patient's chest x-ray, no evidence of pneumothorax, pneumonia, mediastinal widening.  11:52 PM Patient's K is low.  We will replace potassium here and also discharge her with some prescription pack potassium. Final Clinical Impression(s) / ED Diagnoses Final diagnoses:  Acute hypokalemia  Chest discomfort    Rx / DC Orders ED Discharge Orders          Ordered    Potassium Chloride 10 MEQ PACK  2 times daily        02/21/23 2349              Derwood Kaplan, MD 02/21/23 2352

## 2023-02-21 NOTE — ED Triage Notes (Signed)
Pt via POV c/o sore throat and indigestion x 1 week. She was evaluated on Wed and received new rx for sucralfate and other meds but her symptoms have returned. Pt notes that she has a very dry mouth also. Pain rated 6/10 in esophagus

## 2023-02-22 LAB — TROPONIN I (HIGH SENSITIVITY): Troponin I (High Sensitivity): 3 ng/L (ref <18)

## 2023-02-22 NOTE — ED Provider Notes (Signed)
Signed out to me to follow-up on second troponin.  Patient seen with atypical chest pain.  Clinically, pain felt to be related to GI etiology.  Second troponin has returned, is normal.  Patient will be discharged, follow-up with her care team.   Gilda Crease, MD 02/22/23 0120

## 2023-02-23 ENCOUNTER — Telehealth: Payer: Self-pay | Admitting: Family Medicine

## 2023-02-23 NOTE — Telephone Encounter (Signed)
Patient was seen at ER on 5/26 and was told her potassium was low and give packets of pills and prescription was sent to her pharmacy but its going to cost her $47.00 with insurance. Please review labs to see if needs to keep taking medication if so can you send in a prescription to [facility].  

## 2023-02-23 NOTE — Telephone Encounter (Signed)
Patient was seen at ER on 5/26 and was told her potassium was low and give packets of pills and prescription was sent to her pharmacy but its going to cost her $47.00 with insurance. Please review labs to see if needs to keep taking medication if so can you send in a prescription to [facility].

## 2023-02-24 NOTE — Telephone Encounter (Signed)
Patient is now taking potassium tablets , she will follow up on 03/03/23

## 2023-03-03 ENCOUNTER — Ambulatory Visit (HOSPITAL_COMMUNITY): Payer: Medicare Other | Admitting: Clinical

## 2023-03-03 ENCOUNTER — Ambulatory Visit (INDEPENDENT_AMBULATORY_CARE_PROVIDER_SITE_OTHER): Payer: Medicare Other | Admitting: Family Medicine

## 2023-03-03 VITALS — BP 122/74 | HR 77 | Temp 97.5°F | Ht 63.0 in | Wt 216.0 lb

## 2023-03-03 DIAGNOSIS — M542 Cervicalgia: Secondary | ICD-10-CM | POA: Diagnosis not present

## 2023-03-03 DIAGNOSIS — M9901 Segmental and somatic dysfunction of cervical region: Secondary | ICD-10-CM | POA: Diagnosis not present

## 2023-03-03 DIAGNOSIS — I10 Essential (primary) hypertension: Secondary | ICD-10-CM | POA: Diagnosis not present

## 2023-03-03 DIAGNOSIS — E119 Type 2 diabetes mellitus without complications: Secondary | ICD-10-CM | POA: Diagnosis not present

## 2023-03-03 DIAGNOSIS — E876 Hypokalemia: Secondary | ICD-10-CM | POA: Insufficient documentation

## 2023-03-03 DIAGNOSIS — M9902 Segmental and somatic dysfunction of thoracic region: Secondary | ICD-10-CM | POA: Diagnosis not present

## 2023-03-03 DIAGNOSIS — M546 Pain in thoracic spine: Secondary | ICD-10-CM | POA: Diagnosis not present

## 2023-03-03 MED ORDER — LEVOCETIRIZINE DIHYDROCHLORIDE 5 MG PO TABS: 5.0000 mg | ORAL_TABLET | Freq: Every evening | ORAL | 3 refills | Status: DC

## 2023-03-03 MED ORDER — AMLODIPINE BESYLATE 2.5 MG PO TABS: 2.5000 mg | ORAL_TABLET | Freq: Every day | ORAL | 0 refills | Status: DC

## 2023-03-03 NOTE — Assessment & Plan Note (Signed)
BP is well-controlled.  However, she is experiencing side effects from medication.  Stopping valsartan.  Stopping HCTZ.  Continue metoprolol.  Adding low-dose amlodipine.

## 2023-03-03 NOTE — Assessment & Plan Note (Signed)
Due for A1c.  A1c ordered.

## 2023-03-03 NOTE — Assessment & Plan Note (Signed)
Metabolic panel today to reassess.  Stopping HCTZ.

## 2023-03-03 NOTE — Patient Instructions (Signed)
Labs today.  Stop HCTZ and Valsartan.  Low dose Amlodipine.  Follow up in 1 month.

## 2023-03-03 NOTE — Progress Notes (Signed)
Subjective:  Patient ID: Jessica Kirby, female    DOB: Jul 26, 1956  Age: 67 y.o. MRN: [id number]  CC: Chief Complaint  Patient presents with   Hospitalization Follow-up    Hypokalemia-feeling ok but tired and frustrated with hacking dry cough with relation to valsartan , also coughed with losartan, low leg , ankle, and foot swelling    HPI:  67 year old female presents for ED follow-up.  Patient recently seen in the ER.  Found to be hypokalemic.  Patient was given potassium and discharged home in stable condition.  Patient reports that she has been holding her HCTZ.  She states that she is experiencing cough from valsartan.  Additionally, she has had some lower extremity edema although she does not have any at this time.  She is in need of repeat labs.  Blood pressure is well-controlled here today.  She states that she has not taken any of her blood pressure medication today.  She is on metoprolol as well.  Additionally, patient requests an antihistamine for allergies.  Patient Active Problem List   Diagnosis Date Noted   Hypokalemia 03/03/2023   Proteinuria 12/09/2022   Chronic idiopathic constipation 10/15/2022   Hyperlipidemia 03/25/2022   Diabetes mellitus without complication (HCC) 11/28/2020   Anxiety 11/04/2015   GERD (gastroesophageal reflux disease) 09/27/2013   Hypertension    Osteopenia     Social Hx   Social History   Socioeconomic History   Marital status: Widowed    Spouse name: Not on file   Number of children: Not on file   Years of education: Not on file   Highest education level: Not on file  Occupational History   Not on file  Tobacco Use   Smoking status: Never    Passive exposure: Yes   Smokeless tobacco: Never  Vaping Use   Vaping Use: Never used  Substance and Sexual Activity   Alcohol use: No    Alcohol/week: 0.0 standard drinks of alcohol   Drug use: No   Sexual activity: Yes  Other Topics Concern   Not on file  Social History Narrative    Not on file   Social Determinants of Health   Financial Resource Strain: Low Risk  (12/30/2022)   Overall Financial Resource Strain (CARDIA)    Difficulty of Paying Living Expenses: Not very hard  Food Insecurity: No Food Insecurity (12/30/2022)   Hunger Vital Sign    Worried About Running Out of Food in the Last Year: Never true    Ran Out of Food in the Last Year: Never true  Transportation Needs: No Transportation Needs (12/30/2022)   PRAPARE - Administrator, Civil Service (Medical): No    Lack of Transportation (Non-Medical): No  Physical Activity: Insufficiently Active (12/30/2022)   Exercise Vital Sign    Days of Exercise per Week: 4 days    Minutes of Exercise per Session: 30 min  Stress: No Stress Concern Present (12/30/2022)   [employer] of Occupational Health - Occupational Stress Questionnaire    Feeling of Stress : Only a little  Social Connections: Moderately Integrated (12/30/2022)   Social Connection and Isolation Panel [NHANES]    Frequency of Communication with Friends and Family: More than three times a week    Frequency of Social Gatherings with Friends and Family: More than three times a week    Attends Religious Services: More than 4 times per year    Active Member of Clubs or Organizations: Yes    Attends  Club or Organization Meetings: More than 4 times per year    Marital Status: Widowed    Review of Systems Per HPI  Objective:  BP 122/74   Pulse 77   Temp (!) 97.5 F (36.4 C)   Ht 5\' 3"  (1.6 m)   Wt 216 lb (98 kg)   SpO2 95%   BMI 38.26 kg/m      03/03/2023    8:32 AM 02/22/2023    2:30 AM 02/22/2023    1:00 AM  BP/Weight  Systolic BP 122 130 141  Diastolic BP 74 61 78  Wt. (Lbs) 216    BMI 38.26 kg/m2      Physical Exam Vitals and nursing note reviewed.  Constitutional:      General: She is not in acute distress.    Appearance: Normal appearance.  HENT:     Head: Normocephalic and atraumatic.  Cardiovascular:     Rate  and Rhythm: Normal rate and regular rhythm.     Comments: No significant lower extremity edema on exam. Pulmonary:     Effort: Pulmonary effort is normal.     Breath sounds: Normal breath sounds.  Neurological:     Mental Status: She is alert.  Psychiatric:        Mood and Affect: Mood normal.        Behavior: Behavior normal.     Lab Results  Component Value Date   WBC 10.7 (H) 02/21/2023   HGB 12.7 02/21/2023   HCT 37.6 02/21/2023   PLT 264 02/21/2023   GLUCOSE 175 (H) 02/21/2023   CHOL 142 11/24/2022   TRIG 132 11/24/2022   HDL 53 11/24/2022   LDLCALC 66 11/24/2022   ALT 27 11/24/2022   AST 24 11/24/2022   NA 133 (L) 02/21/2023   K 2.9 (L) 02/21/2023   CL 95 (L) 02/21/2023   CREATININE 0.71 02/21/2023   BUN 9 02/21/2023   CO2 24 02/21/2023   TSH 2.820 06/10/2022   INR 0.9 02/03/2022   HGBA1C 6.8 (H) 11/24/2022     Assessment & Plan:   Problem List Items Addressed This Visit       Cardiovascular and Mediastinum   Hypertension - Primary    BP is well-controlled.  However, she is experiencing side effects from medication.  Stopping valsartan.  Stopping HCTZ.  Continue metoprolol.  Adding low-dose amlodipine.      Relevant Medications   amLODipine (NORVASC) 2.5 MG tablet     Endocrine   Diabetes mellitus without complication (HCC)    Due for A1c.  A1c ordered.      Relevant Orders   CMP14+EGFR   Hemoglobin A1c     Other   Hypokalemia    Metabolic panel today to reassess.  Stopping HCTZ.       Meds ordered this encounter  Medications   levocetirizine (XYZAL) 5 MG tablet    Sig: Take 1 tablet (5 mg total) by mouth every evening.    Dispense:  90 tablet    Refill:  3   amLODipine (NORVASC) 2.5 MG tablet    Sig: Take 1 tablet (2.5 mg total) by mouth daily.    Dispense:  90 tablet    Refill:  0    Follow-up:  Return in about 1 month (around 04/02/2023) for HTN follow up.  Everlene Other DO [facility]

## 2023-03-04 LAB — HEMOGLOBIN A1C
Est. average glucose Bld gHb Est-mCnc: 154 mg/dL
Hgb A1c MFr Bld: 7 % — ABNORMAL HIGH (ref 4.8–5.6)

## 2023-03-04 LAB — CMP14+EGFR
ALT: 19 IU/L (ref 0–32)
AST: 17 IU/L (ref 0–40)
Albumin/Globulin Ratio: 1.8 (ref 1.2–2.2)
Albumin: 4.1 g/dL (ref 3.9–4.9)
Alkaline Phosphatase: 70 IU/L (ref 44–121)
BUN/Creatinine Ratio: 14 (ref 12–28)
BUN: 11 mg/dL (ref 8–27)
Bilirubin Total: 0.6 mg/dL (ref 0.0–1.2)
CO2: 24 mmol/L (ref 20–29)
Calcium: 9.1 mg/dL (ref 8.7–10.3)
Chloride: 107 mmol/L — ABNORMAL HIGH (ref 96–106)
Creatinine, Ser: 0.76 mg/dL (ref 0.57–1.00)
Globulin, Total: 2.3 g/dL (ref 1.5–4.5)
Glucose: 125 mg/dL — ABNORMAL HIGH (ref 70–99)
Potassium: 4.3 mmol/L (ref 3.5–5.2)
Sodium: 144 mmol/L (ref 134–144)
Total Protein: 6.4 g/dL (ref 6.0–8.5)
eGFR: 85 mL/min/{1.73_m2} (ref >59)

## 2023-03-05 DIAGNOSIS — M546 Pain in thoracic spine: Secondary | ICD-10-CM | POA: Diagnosis not present

## 2023-03-05 DIAGNOSIS — M9902 Segmental and somatic dysfunction of thoracic region: Secondary | ICD-10-CM | POA: Diagnosis not present

## 2023-03-05 DIAGNOSIS — M62830 Muscle spasm of back: Secondary | ICD-10-CM | POA: Diagnosis not present

## 2023-03-05 DIAGNOSIS — M9901 Segmental and somatic dysfunction of cervical region: Secondary | ICD-10-CM | POA: Diagnosis not present

## 2023-03-05 DIAGNOSIS — M9903 Segmental and somatic dysfunction of lumbar region: Secondary | ICD-10-CM | POA: Diagnosis not present

## 2023-03-05 DIAGNOSIS — M542 Cervicalgia: Secondary | ICD-10-CM | POA: Diagnosis not present

## 2023-03-08 DIAGNOSIS — M9903 Segmental and somatic dysfunction of lumbar region: Secondary | ICD-10-CM | POA: Diagnosis not present

## 2023-03-08 DIAGNOSIS — M542 Cervicalgia: Secondary | ICD-10-CM | POA: Diagnosis not present

## 2023-03-08 DIAGNOSIS — M546 Pain in thoracic spine: Secondary | ICD-10-CM | POA: Diagnosis not present

## 2023-03-08 DIAGNOSIS — M9901 Segmental and somatic dysfunction of cervical region: Secondary | ICD-10-CM | POA: Diagnosis not present

## 2023-03-08 DIAGNOSIS — M9902 Segmental and somatic dysfunction of thoracic region: Secondary | ICD-10-CM | POA: Diagnosis not present

## 2023-03-08 DIAGNOSIS — M62830 Muscle spasm of back: Secondary | ICD-10-CM | POA: Diagnosis not present

## 2023-03-11 ENCOUNTER — Ambulatory Visit: Payer: Medicare Other | Admitting: Family Medicine

## 2023-03-12 DIAGNOSIS — M9901 Segmental and somatic dysfunction of cervical region: Secondary | ICD-10-CM | POA: Diagnosis not present

## 2023-03-12 DIAGNOSIS — M9903 Segmental and somatic dysfunction of lumbar region: Secondary | ICD-10-CM | POA: Diagnosis not present

## 2023-03-12 DIAGNOSIS — M9902 Segmental and somatic dysfunction of thoracic region: Secondary | ICD-10-CM | POA: Diagnosis not present

## 2023-03-12 DIAGNOSIS — M542 Cervicalgia: Secondary | ICD-10-CM | POA: Diagnosis not present

## 2023-03-12 DIAGNOSIS — M62830 Muscle spasm of back: Secondary | ICD-10-CM | POA: Diagnosis not present

## 2023-03-12 DIAGNOSIS — M546 Pain in thoracic spine: Secondary | ICD-10-CM | POA: Diagnosis not present

## 2023-03-15 ENCOUNTER — Ambulatory Visit: Payer: Medicare Other | Admitting: Family Medicine

## 2023-03-15 DIAGNOSIS — M9903 Segmental and somatic dysfunction of lumbar region: Secondary | ICD-10-CM | POA: Diagnosis not present

## 2023-03-15 DIAGNOSIS — M542 Cervicalgia: Secondary | ICD-10-CM | POA: Diagnosis not present

## 2023-03-15 DIAGNOSIS — M9901 Segmental and somatic dysfunction of cervical region: Secondary | ICD-10-CM | POA: Diagnosis not present

## 2023-03-15 DIAGNOSIS — M62830 Muscle spasm of back: Secondary | ICD-10-CM | POA: Diagnosis not present

## 2023-03-15 DIAGNOSIS — M546 Pain in thoracic spine: Secondary | ICD-10-CM | POA: Diagnosis not present

## 2023-03-15 DIAGNOSIS — M9902 Segmental and somatic dysfunction of thoracic region: Secondary | ICD-10-CM | POA: Diagnosis not present

## 2023-03-19 ENCOUNTER — Telehealth: Payer: Self-pay | Admitting: Obstetrics & Gynecology

## 2023-03-19 DIAGNOSIS — M62830 Muscle spasm of back: Secondary | ICD-10-CM | POA: Diagnosis not present

## 2023-03-19 DIAGNOSIS — M9901 Segmental and somatic dysfunction of cervical region: Secondary | ICD-10-CM | POA: Diagnosis not present

## 2023-03-19 DIAGNOSIS — M546 Pain in thoracic spine: Secondary | ICD-10-CM | POA: Diagnosis not present

## 2023-03-19 DIAGNOSIS — M9903 Segmental and somatic dysfunction of lumbar region: Secondary | ICD-10-CM | POA: Diagnosis not present

## 2023-03-19 DIAGNOSIS — M9902 Segmental and somatic dysfunction of thoracic region: Secondary | ICD-10-CM | POA: Diagnosis not present

## 2023-03-19 DIAGNOSIS — M542 Cervicalgia: Secondary | ICD-10-CM | POA: Diagnosis not present

## 2023-03-19 NOTE — Telephone Encounter (Signed)
Patient stopped taking medicine with the samples that was given about 1 month ago. She is not able to hold urine. Please advise if she still needs to come for medication follow up.

## 2023-03-19 NOTE — Telephone Encounter (Signed)
Returned patient's call. States she is no longer taking the Mrybetriq and has stopped her hydrochlorothiazide. Doesn't think she needs a f/u appt with [place].  Advised patient if she wanted to be seen here if symptoms return or worsen, to let us know.  Could also f/u with PCP about issues in July at her next appt with them.  Pt verbalized understanding with no further questions.

## 2023-03-26 DIAGNOSIS — M9902 Segmental and somatic dysfunction of thoracic region: Secondary | ICD-10-CM | POA: Diagnosis not present

## 2023-03-26 DIAGNOSIS — M542 Cervicalgia: Secondary | ICD-10-CM | POA: Diagnosis not present

## 2023-03-26 DIAGNOSIS — M9901 Segmental and somatic dysfunction of cervical region: Secondary | ICD-10-CM | POA: Diagnosis not present

## 2023-03-26 DIAGNOSIS — M546 Pain in thoracic spine: Secondary | ICD-10-CM | POA: Diagnosis not present

## 2023-03-26 DIAGNOSIS — M62830 Muscle spasm of back: Secondary | ICD-10-CM | POA: Diagnosis not present

## 2023-03-26 DIAGNOSIS — M9903 Segmental and somatic dysfunction of lumbar region: Secondary | ICD-10-CM | POA: Diagnosis not present

## 2023-03-28 NOTE — Progress Notes (Unsigned)
GI Office Note    Referring Provider: Tommie Sams, DO Primary Care Physician:  Tommie Sams, DO Primary Gastroenterologist: Dolores Frame, MD   Date:  03/29/2023  ID:  Jessica Kirby, DOB 1956-06-27, MRN [id number]   Chief Complaint   Chief Complaint  Patient presents with   Follow-up    Follow up GERD and constipation. Both are better   History of Present Illness  Jessica Kirby is a 67 y.o. female with a history of constipation, GERD, diabetes, hypothyroidism, anxiety, and depression presenting today for follow-up of GERD, dysphagia, and constipation.  Last colonoscopy in 2017: -Sigmoid and descending diverticulosis   Office visit 10/15/2022.  Noted decrease in regurgitation issues since she came off potassium pills continue Protonix.  Previously takes this about twice per week when she eats something she knows she should not.  Has heartburn symptoms occasionally.  Denies any dysphagia or odynophagia, nausea vomiting, early satiety.  Tries to avoid spicy foods, caffeine, greasy foods.  Struggling with some constipation during a bowel movement once a day but occasionally she may skip a day.  Feels that this may be related to HCTZ.  Stools hard at times, takes MiraLAX as needed but makes her feel bloated.  Has tried increasing fiber in diet.  Occasionally has to strain. Advised PPI as needed and reflux precautions.  Start Benefiber 1 tablespoon twice daily with meals and for 64 ounces of water daily and increase fiber in her diet.  Last office visit 02/17/23.  Reported frequent nightly throat irritation patient is getting stuck in her throat at times.  Denied any odynophagia or hoarseness.  Also having issues with cough at night around 4-5 PM.  Usually takes her multivitamin with applesauce.  Also having a frequent dry cough.  Reports stools have been a lighter color but usually brown.  Stools have been hard at times. Advised Carafate for 2 weeks, low dose PPI once daily.  Applesauce for medications. Daily stool softener and miralax as needed. High fiber diet and benefiber.   ED visit in May for atypical chest pain and found to have low potassium and EKG with NSR. Followed up with pcp and BP medications changed. (Hydrochlorothiazide and valsartan stopped and started on amlodipine and continued on metoprolol)  Potassium stable on 03/03/23. LFTs also normal.   Today: Constipation - BM everyday. Stools are still hard and does have to strain some. Takes miralax as needed (once per week), everyday use tends to bloat her. Has not tried colace alone.   GERD, Dysphagia, cough - Has not been having acid but feels like pills are coming back up into her throat. She has stopped HCTZ and valsartan. At times she feels like her medications are not dissolving and that anything that is powder coated is getting stuck in her throat. Taking her medications with applesauce. Takes an allergy pill at night and states she can taste that powder as well. Cough is improved. Not getting strangled with foods. At times does have a little bit of acid that comes up but does not occur all the time. Things always feel like they are stopping med chest.   Tries to avoid excessive amounts of caffeine.   Gets a powdery feeling to her tongue at times. Tries to drink as much water as possible - drinks at least 1/2 to 3/4 of a bottle of water to get her medications down. Tries not to take too many medications at night.   She states at times she  feels like her stool has been white at times related to certain medications (cetrum silver) but she was able to taste it all day.    Current Outpatient Medications  Medication Sig Dispense Refill   Accu-Chek Softclix Lancets lancets Use to check blood sugars daily before meals E11.9 100 each 7   ALPRAZolam (XANAX) 0.5 MG tablet TAKE 1 TABLET(0.5 MG) BY MOUTH DAILY AS NEEDED FOR ANXIETY 30 tablet 0   amLODipine (NORVASC) 2.5 MG tablet Take 1 tablet (2.5 mg total) by  mouth daily. 90 tablet 0   aspirin EC 81 MG tablet Take 81 mg by mouth daily.      blood glucose meter kit and supplies Dispense based on patient and insurance preference. Use to test glucose twice daily  (FOR ICD-10,  E11.9). 1 each 5   fluticasone (FLONASE) 50 MCG/ACT nasal spray Place 2 sprays into both nostrils daily. 16 g 6   glucose blood test strip Use to check blood sugars daily before meals E11.9 100 each 7   levocetirizine (XYZAL) 5 MG tablet Take 1 tablet (5 mg total) by mouth every evening. 90 tablet 3   metoprolol succinate (TOPROL-XL) 25 MG 24 hr tablet Take 1 tablet (25 mg total) by mouth daily. 90 tablet 3   polyethylene glycol (MIRALAX / GLYCOLAX) 17 g packet Take 17 g by mouth daily.     rosuvastatin (CRESTOR) 10 MG tablet Take 1 tablet (10 mg total) by mouth daily. 90 tablet 3   pantoprazole (PROTONIX) 20 MG tablet TAKE 1 TABLET(20 MG) BY MOUTH DAILY (Patient not taking: Reported on 03/29/2023) 90 tablet 3   No current facility-administered medications for this visit.    Past Medical History:  Diagnosis Date   Allergy    Anxiety    Insomnia   Depression    Insomnia   Diabetes mellitus without complication (HCC) 11/28/2020   GERD (gastroesophageal reflux disease)    Herniated disc    Hypertension    Hypothyroidism    not medicated at this time   Osteoporosis    Thyroid disease    Hypothyroidism    Past Surgical History:  Procedure Laterality Date   ABDOMINAL HYSTERECTOMY     COLONOSCOPY N/A 04/07/2016   Procedure: COLONOSCOPY;  Surgeon: Corbin Ade, MD;  Location: [facility];  Service: Endoscopy;  Laterality: N/A;  8:30 AM   LEFT HEART CATH AND CORONARY ANGIOGRAPHY N/A 07/01/2022   Procedure: LEFT HEART CATH AND CORONARY ANGIOGRAPHY;  Surgeon: Lennette Bihari, MD;  Location: [facility];  Service: Cardiovascular;  Laterality: N/A;   TUBAL LIGATION     WRIST SURGERY      Family History  Problem Relation Age of Onset   Diabetes Father    Heart  attack Mother    Heart disease Mother    Bladder Cancer Other    Colon cancer Neg Hx     Allergies as of 03/29/2023 - Review Complete 03/29/2023  Allergen Reaction Noted   Fosamax [alendronate sodium] Other (See Comments) 12/21/2012   Vioxx [rofecoxib]  12/21/2012   Losartan Cough 06/13/2015   Sulfa antibiotics Hives 09/10/2011    Social History   Socioeconomic History   Marital status: Widowed    Spouse name: Not on file   Number of children: Not on file   Years of education: Not on file   Highest education level: Not on file  Occupational History   Not on file  Tobacco Use   Smoking status: Never  Passive exposure: Yes   Smokeless tobacco: Never  Vaping Use   Vaping Use: Never used  Substance and Sexual Activity   Alcohol use: No    Alcohol/week: 0.0 standard drinks of alcohol   Drug use: No   Sexual activity: Yes  Other Topics Concern   Not on file  Social History Narrative   Not on file   Social Determinants of Health   Financial Resource Strain: Low Risk  (12/30/2022)   Overall Financial Resource Strain (CARDIA)    Difficulty of Paying Living Expenses: Not very hard  Food Insecurity: No Food Insecurity (12/30/2022)   Hunger Vital Sign    Worried About Running Out of Food in the Last Year: Never true    Ran Out of Food in the Last Year: Never true  Transportation Needs: No Transportation Needs (12/30/2022)   PRAPARE - Administrator, Civil Service (Medical): No    Lack of Transportation (Non-Medical): No  Physical Activity: Insufficiently Active (12/30/2022)   Exercise Vital Sign    Days of Exercise per Week: 4 days    Minutes of Exercise per Session: 30 min  Stress: No Stress Concern Present (12/30/2022)   [employer] of Occupational Health - Occupational Stress Questionnaire    Feeling of Stress : Only a little  Social Connections: Moderately Integrated (12/30/2022)   Social Connection and Isolation Panel [NHANES]    Frequency of  Communication with Friends and Family: More than three times a week    Frequency of Social Gatherings with Friends and Family: More than three times a week    Attends Religious Services: More than 4 times per year    Active Member of [employer] or Organizations: Yes    Attends Banker Meetings: More than 4 times per year    Marital Status: Widowed   Review of Systems   Gen: Denies fever, chills, anorexia. Denies fatigue, weakness, weight loss.  CV: Denies chest pain, palpitations, syncope, peripheral edema, and claudication. Resp: Denies dyspnea at rest, cough, wheezing, coughing up blood, and pleurisy. GI: See HPI Derm: Denies rash, itching, dry skin Psych: Denies depression, anxiety, memory loss, confusion. No homicidal or suicidal ideation.  Heme: Denies bruising, bleeding, and enlarged lymph nodes.  Physical Exam   BP (!) 147/75   Pulse 78   Temp 97.8 F (36.6 C)   Ht 5' 2.5" (1.588 m)   Wt 219 lb 6.4 oz (99.5 kg)   BMI 39.49 kg/m   General:   Alert and oriented. No distress noted. Pleasant and cooperative.  Head:  Normocephalic and atraumatic. Eyes:  Conjuctiva clear without scleral icterus. Lungs:  Clear to auscultation bilaterally. No wheezes, rales, or rhonchi. No distress.  Heart:  S1, S2 present without murmurs appreciated.  Abdomen:  +BS, soft, non-tender and non-distended. No rebound or guarding. No HSM or masses noted. Rectal: deferred Msk:  Symmetrical without gross deformities. Normal posture. Extremities:  Without edema. Neurologic:  Alert and  oriented x4 Psych:  Alert and cooperative. Normal mood and affect.   Assessment  Jessica Kirby is a 67 y.o. female with a history of constipation, GERD, diabetes, hypothyroidism, anxiety, and depression presenting today for follow-up of GERD, dysphagia, and constipation.   GERD, Dysphagia, voice hoarseness: Overall cough is improved and she has stopped taking HCTZ and valsartan.  She continues to feel  coating from pills stuck in her throat and is having intermittent acid reflux but denies any overt burning.  She continues to note a  change in her voice as well and also feels as though medications specifically are getting stuck mid substernal region.  No history of prior EGD and has only been taking PPI as needed.  Continues to need applesauce for swallowing medications.  Discussed EGD versus BPE and patient would like to pursue BPE for further evaluation of her swallowing.  GERD diet was reinforced as well and encouraged her to be consistent with her PPI once daily.  She may ultimately need upper endoscopy with possible dilation.   Constipation: Typically with a bowel movement daily stools are hard stools and straining at times.  Has tried using MiraLAX but if she uses this consistently she has significant bloating.  Advised to trial Colace once daily by itself and MiraLAX once weekly as she has been to see if she has any relief of constipation with this regimen.     PLAN   Colace 50 mg once daily Continue miralax once weekly.  Encouraged to take her pantoprazole 20 mg once daily, 30 minutes prior to breakfast.  GERD diet BPE Follow up in 8 weeks.    Brooke Bonito, MSN, FNP-BC, AGACNP-BC [facility]   I have reviewed the note and agree with the APP's assessment as described in this progress note  Katrinka Blazing, MD Gastroenterology and Hepatology [facility]

## 2023-03-29 ENCOUNTER — Encounter: Payer: Self-pay | Admitting: Gastroenterology

## 2023-03-29 ENCOUNTER — Ambulatory Visit: Payer: Medicare Other | Admitting: Gastroenterology

## 2023-03-29 VITALS — BP 147/75 | HR 78 | Temp 97.8°F | Ht 62.5 in | Wt 219.4 lb

## 2023-03-29 DIAGNOSIS — K5904 Chronic idiopathic constipation: Secondary | ICD-10-CM

## 2023-03-29 DIAGNOSIS — K219 Gastro-esophageal reflux disease without esophagitis: Secondary | ICD-10-CM

## 2023-03-29 DIAGNOSIS — R1310 Dysphagia, unspecified: Secondary | ICD-10-CM | POA: Diagnosis not present

## 2023-03-29 DIAGNOSIS — R490 Dysphonia: Secondary | ICD-10-CM | POA: Diagnosis not present

## 2023-03-29 NOTE — Patient Instructions (Addendum)
Follow a GERD diet:  Avoid fried, fatty, greasy, spicy, citrus foods. Avoid caffeine and carbonated beverages. Avoid chocolate. Try eating 4-6 small meals a day rather than 3 large meals. Do not eat within 3 hours of laying down. Prop head of bed up on wood or bricks to create a 6 inch incline.  Try taking your pantoprazole everyday regardless if you have burning or not and if you have concerns about swallowing you may mix the contents into a spoonful of applesauce.  We will schedule you for a barium pill esophagram to assess your swallowing issue. You can call your insurance company to ask about cost and tell them the diagnosis of dysphagia.   Start colace 50 mg once daily to help with firm stools and continue miralax once weekly.   We will plan to follow up in 8 weeks.  It was a pleasure to see you today. I want to create trusting relationships with patients. If you receive a survey regarding your visit,  I greatly appreciate you taking time to fill this out on paper or through your MyChart. I value your feedback.  Brooke Bonito, MSN, FNP-BC, AGACNP-BC [facility]

## 2023-03-31 ENCOUNTER — Ambulatory Visit (HOSPITAL_COMMUNITY): Payer: Medicare Other | Admitting: Clinical

## 2023-03-31 DIAGNOSIS — F4321 Adjustment disorder with depressed mood: Secondary | ICD-10-CM

## 2023-03-31 DIAGNOSIS — F4322 Adjustment disorder with anxiety: Secondary | ICD-10-CM | POA: Diagnosis not present

## 2023-03-31 DIAGNOSIS — F321 Major depressive disorder, single episode, moderate: Secondary | ICD-10-CM | POA: Diagnosis not present

## 2023-03-31 NOTE — Progress Notes (Signed)
IN PERSON   I connected with Jessica Kirby on 03/31/23 at  8:00 AM EDT in person and verified that I am speaking with the correct person using two identifiers.   Location: Patient: Office Provider: Office   I discussed the limitations of evaluation and management by telemedicine and the availability of in person appointments. The patient expressed understanding and agreed to proceed. (IN PERSON)   THERAPIST PROGRESS NOTE   Session Time: 8:00 AM-8:45 AM   Participation Level: Active   Behavioral Response: CasualAlertDepressed   Type of Therapy: Individual Therapy   Treatment Goals addressed: Depression and Greif   Interventions: CBT, Motivational Interviewing, Solution Focused and Strength-based   Summary: Jessica Kirby is a 67 y.o. female who presents with Depression /Adjustments Disorder/ unresolved grief. The OPT therapist worked with the patient for her ongoing OPT treatment. The OPT therapist utilized Motivational Interviewing to assist in creating therapeutic repore. The patient in the session was engaged and work in collaboration giving feedback about her triggers and symptoms over the past few weeks. The patient spoke about the impact of the son who was living with her being incarcerated and his relaease date coming up in July from which and due to the prior conflict she is not going to be allowing him to come back and live with her. Additionally the patient spoke about the anniversary of losing her partner being yesterday and this being a tough day for her. The patient spoke about several family related events coming up in July that she is going to be involved in and looking forward to attending..The OPT therapist utilized Cognitive Behavioral Therapy through cognitive restructuring as well as worked with the patient on coping strategies to assist in management of mood and being active. The OPT therapist promoted the patients work to challenge automatic negative thinking and  promoted positive mindset.The OPT therapist worked with the patient providing support and psycho-education. The patient spoke about focusing on herself and getting healthy. The patient is still wanting to get back to the gym on a routine basis. The patient overviewed her upcoming health appointments as listed in patients MyChart.   Suicidal/Homicidal: Nowithout intent/plan   Therapist Response: The OPT therapist worked with the patient for the patients scheduled session. The patient was engaged in her session and gave feedback in relation to triggers, symptoms, and behavior responses over the past few weeks. The patient her feelings about her sons upcoming release from incarceration and her decision to not allow him to come back and live with her . The patient spoke about the anniversary of her partner passing being yesterday and this was difficult. The patient spoke about her upcoming birthday, July 4th holiday, grandchild birthday, and a gender reveal all happening in July. The OPT therapist worked with the patient utilizing an in session Cognitive Behavioral Therapy exercise. The patient was responsive in the session and verbalized, "I have been working to adjust in handling things my late husband would normally take care of". The patient spoke about her plans to be active getting back into the gym and going out to dinner with neighbors for socialization, working to be more aware of her baseline and working to get back to her baseline and maintaining this with consistency. The OPT therapist worked with the patient reviewing coping strategies and promoting on going self check-ins..The patient will continue to work on her physical health and attend all scheduled health appointments. The OPT therapist will continue treatment work with the patient in her  next scheduled session.   Plan: Return again in 2/3 weeks.   Diagnosis:      Axis I: Major depressive disorder, single episode, moderate / Unresolved grief/  Adjustment Disorder with anxious mood                           Axis II: No diagnosis   Collaboration of Care: Collaboration of care for this session review of med therapy with Dr. Tenny Craw.   Patient/Guardian was advised Release of Information must be obtained prior to any record release in order to collaborate their care with an outside provider. Patient/Guardian was advised if they have not already done so to contact the registration department to sign all necessary forms in order for [place] to release information regarding their care.    Consent: Patient/Guardian gives verbal consent for treatment and assignment of benefits for services provided during this visit. Patient/Guardian expressed understanding and agreed to proceed.       I discussed the assessment and treatment plan with the patient. The patient was provided an opportunity to ask questions and all were answered. The patient agreed with the plan and demonstrated an understanding of the instructions.   The patient was advised to call back or seek an in-person evaluation if the symptoms worsen or if the condition fails to improve as anticipated.   I provided 45 minutes of face-to-face time during this encounter.   Suzan Garibaldi, LCSW   03/31/2023

## 2023-04-02 ENCOUNTER — Ambulatory Visit (HOSPITAL_COMMUNITY): Payer: Medicare Other

## 2023-04-02 ENCOUNTER — Ambulatory Visit (HOSPITAL_COMMUNITY)
Admission: RE | Admit: 2023-04-02 | Discharge: 2023-04-02 | Disposition: A | Discharge status: Home / Self Care | Payer: Medicare Other | Source: Ambulatory Visit | Attending: Gastroenterology | Admitting: Gastroenterology

## 2023-04-02 DIAGNOSIS — K219 Gastro-esophageal reflux disease without esophagitis: Secondary | ICD-10-CM | POA: Diagnosis not present

## 2023-04-02 DIAGNOSIS — M546 Pain in thoracic spine: Secondary | ICD-10-CM | POA: Diagnosis not present

## 2023-04-02 DIAGNOSIS — M9901 Segmental and somatic dysfunction of cervical region: Secondary | ICD-10-CM | POA: Diagnosis not present

## 2023-04-02 DIAGNOSIS — M62830 Muscle spasm of back: Secondary | ICD-10-CM | POA: Diagnosis not present

## 2023-04-02 DIAGNOSIS — R1310 Dysphagia, unspecified: Secondary | ICD-10-CM | POA: Diagnosis not present

## 2023-04-02 DIAGNOSIS — M9902 Segmental and somatic dysfunction of thoracic region: Secondary | ICD-10-CM | POA: Diagnosis not present

## 2023-04-02 DIAGNOSIS — R490 Dysphonia: Secondary | ICD-10-CM

## 2023-04-02 DIAGNOSIS — M9903 Segmental and somatic dysfunction of lumbar region: Secondary | ICD-10-CM | POA: Diagnosis not present

## 2023-04-02 DIAGNOSIS — M542 Cervicalgia: Secondary | ICD-10-CM | POA: Diagnosis not present

## 2023-04-05 ENCOUNTER — Other Ambulatory Visit: Payer: Self-pay | Admitting: Internal Medicine

## 2023-04-05 ENCOUNTER — Other Ambulatory Visit: Payer: Self-pay | Admitting: Family Medicine

## 2023-04-05 DIAGNOSIS — E119 Type 2 diabetes mellitus without complications: Secondary | ICD-10-CM

## 2023-04-05 DIAGNOSIS — F418 Other specified anxiety disorders: Secondary | ICD-10-CM

## 2023-04-12 ENCOUNTER — Other Ambulatory Visit: Payer: Self-pay | Admitting: Family Medicine

## 2023-04-12 DIAGNOSIS — E119 Type 2 diabetes mellitus without complications: Secondary | ICD-10-CM

## 2023-04-13 DIAGNOSIS — U071 COVID-19: Secondary | ICD-10-CM | POA: Diagnosis not present

## 2023-04-13 DIAGNOSIS — R030 Elevated blood-pressure reading, without diagnosis of hypertension: Secondary | ICD-10-CM | POA: Diagnosis not present

## 2023-04-14 ENCOUNTER — Other Ambulatory Visit: Payer: Self-pay | Admitting: Family Medicine

## 2023-04-19 ENCOUNTER — Ambulatory Visit (INDEPENDENT_AMBULATORY_CARE_PROVIDER_SITE_OTHER): Payer: Medicare Other | Admitting: Family Medicine

## 2023-04-19 VITALS — BP 151/88 | HR 70 | Temp 98.1°F | Ht 62.5 in | Wt 214.2 lb

## 2023-04-19 DIAGNOSIS — I10 Essential (primary) hypertension: Secondary | ICD-10-CM

## 2023-04-19 MED ORDER — AMLODIPINE BESYLATE 5 MG PO TABS: 5.0000 mg | ORAL_TABLET | Freq: Every day | ORAL | 3 refills | Status: DC

## 2023-04-19 NOTE — Progress Notes (Signed)
Subjective:  Patient ID: Jessica Kirby, female    DOB: 18-Jul-1956  Age: 67 y.o. MRN: [id number]  CC: Chief Complaint  Patient presents with   Hypertension   Covid Positive    PT tested Positive to covid on 7/16 last tuesday    HPI:  67 year old female presents for follow-up regarding hypertension.  Patient has recently had COVID-19.  She is feeling better.  Patient here for hypertension follow-up.  BP elevated here today.  She endorses compliance with amlodipine 2.5 mg daily as well as metoprolol 25 mg daily.  Previously, ARB and HCTZ discontinued due to side effects.  Patient Active Problem List   Diagnosis Date Noted   Proteinuria 12/09/2022   Chronic idiopathic constipation 10/15/2022   Hyperlipidemia 03/25/2022   Diabetes mellitus without complication (HCC) 11/28/2020   Anxiety 11/04/2015   GERD (gastroesophageal reflux disease) 09/27/2013   Hypertension    Osteopenia     Social Hx   Social History   Socioeconomic History   Marital status: Widowed    Spouse name: Not on file   Number of children: Not on file   Years of education: Not on file   Highest education level: Not on file  Occupational History   Not on file  Tobacco Use   Smoking status: Never    Passive exposure: Yes   Smokeless tobacco: Never  Vaping Use   Vaping status: Never Used  Substance and Sexual Activity   Alcohol use: No    Alcohol/week: 0.0 standard drinks of alcohol   Drug use: No   Sexual activity: Yes  Other Topics Concern   Not on file  Social History Narrative   Not on file   Social Determinants of Health   Financial Resource Strain: Low Risk  (12/30/2022)   Overall Financial Resource Strain (CARDIA)    Difficulty of Paying Living Expenses: Not very hard  Food Insecurity: No Food Insecurity (12/30/2022)   Hunger Vital Sign    Worried About Running Out of Food in the Last Year: Never true    Ran Out of Food in the Last Year: Never true  Transportation Needs: No  Transportation Needs (12/30/2022)   PRAPARE - Administrator, Civil Service (Medical): No    Lack of Transportation (Non-Medical): No  Physical Activity: Insufficiently Active (12/30/2022)   Exercise Vital Sign    Days of Exercise per Week: 4 days    Minutes of Exercise per Session: 30 min  Stress: No Stress Concern Present (12/30/2022)   [employer] of Occupational Health - Occupational Stress Questionnaire    Feeling of Stress : Only a little  Social Connections: Moderately Integrated (12/30/2022)   Social Connection and Isolation Panel [NHANES]    Frequency of Communication with Friends and Family: More than three times a week    Frequency of Social Gatherings with Friends and Family: More than three times a week    Attends Religious Services: More than 4 times per year    Active Member of [employer] or Organizations: Yes    Attends Banker Meetings: More than 4 times per year    Marital Status: Widowed    Review of Systems Per HPI  Objective:  BP (!) 151/88   Pulse 70   Temp 98.1 F (36.7 C)   Ht 5' 2.5" (1.588 m)   Wt 214 lb 3.2 oz (97.2 kg)   SpO2 96%   BMI 38.55 kg/m      04/19/2023  9:24 AM 03/29/2023    8:38 AM 03/29/2023    8:33 AM  BP/Weight  Systolic BP 151 147 144  Diastolic BP 88 75 82  Wt. (Lbs) 214.2  219.4  BMI 38.55 kg/m2  39.49 kg/m2    Physical Exam Vitals and nursing note reviewed.  Constitutional:      General: She is not in acute distress.    Appearance: Normal appearance.  HENT:     Head: Normocephalic and atraumatic.  Cardiovascular:     Rate and Rhythm: Normal rate and regular rhythm.  Pulmonary:     Effort: Pulmonary effort is normal.     Breath sounds: Normal breath sounds.  Neurological:     Mental Status: She is alert.  Psychiatric:        Mood and Affect: Mood normal.        Behavior: Behavior normal.     Lab Results  Component Value Date   WBC 10.7 (H) 02/21/2023   HGB 12.7 02/21/2023   HCT 37.6  02/21/2023   PLT 264 02/21/2023   GLUCOSE 125 (H) 03/03/2023   CHOL 142 11/24/2022   TRIG 132 11/24/2022   HDL 53 11/24/2022   LDLCALC 66 11/24/2022   ALT 19 03/03/2023   AST 17 03/03/2023   NA 144 03/03/2023   K 4.3 03/03/2023   CL 107 (H) 03/03/2023   CREATININE 0.76 03/03/2023   BUN 11 03/03/2023   CO2 24 03/03/2023   TSH 2.820 06/10/2022   INR 0.9 02/03/2022   HGBA1C 7.0 (H) 03/03/2023     Assessment & Plan:   Problem List Items Addressed This Visit       Cardiovascular and Mediastinum   Hypertension - Primary    Remains uncontrolled.  Increasing amlodipine.  Follow-up in 3 months.  Advised to keep an eye on blood pressures at home.      Relevant Medications   amLODipine (NORVASC) 5 MG tablet    Meds ordered this encounter  Medications   amLODipine (NORVASC) 5 MG tablet    Sig: Take 1 tablet (5 mg total) by mouth daily.    Dispense:  90 tablet    Refill:  3    Follow-up:  Return in about 3 months (around 07/20/2023) for HTN follow up.  Everlene Other DO [facility]

## 2023-04-19 NOTE — Assessment & Plan Note (Signed)
Remains uncontrolled.  Increasing amlodipine.  Follow-up in 3 months.  Advised to keep an eye on blood pressures at home.

## 2023-04-19 NOTE — Patient Instructions (Addendum)
I have increased the amlodipine to 5 mg daily.  Follow-up in 3 months.  Keep an eye on your blood pressures at home.

## 2023-04-27 ENCOUNTER — Telehealth: Payer: Self-pay | Admitting: Obstetrics & Gynecology

## 2023-04-27 NOTE — Telephone Encounter (Signed)
Patient is asking if there is a cheaper medicine that can be sent in for over active bladder, other than the one that was sent due to it was to much. Her pharmacy [facility] on [address]

## 2023-04-29 DIAGNOSIS — M79674 Pain in right toe(s): Secondary | ICD-10-CM | POA: Diagnosis not present

## 2023-04-29 DIAGNOSIS — L110 Acquired keratosis follicularis: Secondary | ICD-10-CM | POA: Diagnosis not present

## 2023-04-29 DIAGNOSIS — M79672 Pain in left foot: Secondary | ICD-10-CM | POA: Diagnosis not present

## 2023-04-29 DIAGNOSIS — E1140 Type 2 diabetes mellitus with diabetic neuropathy, unspecified: Secondary | ICD-10-CM | POA: Diagnosis not present

## 2023-04-29 DIAGNOSIS — M79671 Pain in right foot: Secondary | ICD-10-CM | POA: Diagnosis not present

## 2023-04-29 DIAGNOSIS — M79675 Pain in left toe(s): Secondary | ICD-10-CM | POA: Diagnosis not present

## 2023-05-05 ENCOUNTER — Ambulatory Visit (HOSPITAL_COMMUNITY): Payer: Medicare Other | Admitting: Clinical

## 2023-05-05 DIAGNOSIS — F321 Major depressive disorder, single episode, moderate: Secondary | ICD-10-CM | POA: Diagnosis not present

## 2023-05-05 DIAGNOSIS — F4322 Adjustment disorder with anxiety: Secondary | ICD-10-CM | POA: Diagnosis not present

## 2023-05-05 DIAGNOSIS — F4321 Adjustment disorder with depressed mood: Secondary | ICD-10-CM

## 2023-05-05 NOTE — Progress Notes (Signed)
N PERSON   I connected with Jessica Kirby on 05/05/23 at  8:00 AM EDT in person and verified that I am speaking with the correct person using two identifiers.   Location: Patient: Office Provider: Office   I discussed the limitations of evaluation and management by telemedicine and the availability of in person appointments. The patient expressed understanding and agreed to proceed. (IN PERSON)   THERAPIST PROGRESS NOTE   Session Time: 8:00 AM-8:45 AM   Participation Level: Active   Behavioral Response: CasualAlertDepressed   Type of Therapy: Individual Therapy   Treatment Goals addressed: Depression and Greif   Interventions: CBT, Motivational Interviewing, Solution Focused and Strength-based   Summary: Jessica Kirby is a 67 y.o. female who presents with Depression /Adjustments Disorder/ unresolved grief. The OPT therapist worked with the patient for her ongoing OPT treatment. The OPT therapist utilized Motivational Interviewing to assist in creating therapeutic repore. The patient in the session was engaged and work in collaboration giving feedback about her triggers and symptoms over the past few weeks. The patient spoke about the impact of having her son who was relaease from incarceration reaching back out and asking her for financial help. The patient spoke about having a new outlook that she feels is going to be healthier for her moving forward which in part is to not be to scared to make decisions that it causes indecision. The patient spoke about a noticeable change with her being at a new place of willingness to move forward and let go of her past. The patient indicated a noticeable difference with the impact of grief not being as present and this allowing her to think about starting a new chapter for herself. The OPT therapist utilized Cognitive Behavioral Therapy through cognitive restructuring as well as worked with the patient on coping strategies to assist in management of  mood and being active. The OPT therapist promoted the patients work to challenge automatic negative thinking and promoted positive mindset.The OPT therapist worked with the patient providing support and psycho-education. The patient spoke about focusing on herself and getting healthy. The patient is  back to working out at the gym on a routine basis.  The OPT therapist worked with the patient on coping strategies including  journaling, using a planner, and getting out of the home more. The patient spoke about her intent to find a volunteer ship preferably involving taking care of animals. The patient overviewed her upcoming health appointments as listed in patients MyChart.   Suicidal/Homicidal: Nowithout intent/plan   Therapist Response: The OPT therapist worked with the patient for the patients scheduled session. The patient was engaged in her session and gave feedback in relation to triggers, symptoms, and behavior responses over the past few weeks. The patient spoke about interactions with her son since his release from prison.The OPT therapist worked with the patient utilizing an in session Cognitive Behavioral Therapy exercise. The patient was responsive in the session and verbalized, " I have been trying to be more active and make decisions and not be as scared to make decisions". The patient spoke about her plans to focus more on herself and her health and be more confortable in saying "No" when she needs to instead of always saying "YES" when ask to do something for other people. . The OPT therapist worked with the patient reviewing coping strategies and promoting on going self check-ins..The patient will continue to work on her physical health and attend all scheduled health appointments. The OPT  therapist will continue treatment work with the patient in her next scheduled session.   Plan: Return again in 2/3 weeks.   Diagnosis:      Axis I: Major depressive disorder, single episode, moderate /  Unresolved grief/ Adjustment Disorder with anxious mood                           Axis II: No diagnosis   Collaboration of Care: Collaboration of care for this session review of med therapy with Dr. Tenny Craw.   Patient/Guardian was advised Release of Information must be obtained prior to any record release in order to collaborate their care with an outside provider. Patient/Guardian was advised if they have not already done so to contact the registration department to sign all necessary forms in order for [place] to release information regarding their care.    Consent: Patient/Guardian gives verbal consent for treatment and assignment of benefits for services provided during this visit. Patient/Guardian expressed understanding and agreed to proceed.       I discussed the assessment and treatment plan with the patient. The patient was provided an opportunity to ask questions and all were answered. The patient agreed with the plan and demonstrated an understanding of the instructions.   The patient was advised to call back or seek an in-person evaluation if the symptoms worsen or if the condition fails to improve as anticipated.   I provided 45 minutes of face-to-face time during this encounter.   Suzan Garibaldi, LCSW   05/05/2023

## 2023-05-14 ENCOUNTER — Ambulatory Visit (INDEPENDENT_AMBULATORY_CARE_PROVIDER_SITE_OTHER): Payer: Medicare Other

## 2023-05-14 VITALS — Ht 62.0 in | Wt 212.0 lb

## 2023-05-14 DIAGNOSIS — Z0000 Encounter for general adult medical examination without abnormal findings: Secondary | ICD-10-CM | POA: Diagnosis not present

## 2023-05-14 NOTE — Progress Notes (Signed)
Because this visit was a virtual/telehealth visit,  certain criteria was not obtained, such a blood pressure, CBG if patient is a diabetic, and timed get up and go. Any medications not marked as "taking" was not mentioned during the medication reconciliation part of the visit. Any vitals not documented were not able to be obtained due to this being a telehealth visit. Vitals that have been documented are verbally provided by the patient.  Patient was unable to self-report a recent blood pressure reading due to a lack of equipment at home via telehealth.  Subjective:   Jessica Kirby is a 67 y.o. female who presents for Medicare Annual (Subsequent) preventive examination.  Visit Complete: Virtual  I connected with  Jessica Kirby on 05/14/23 by a audio enabled telemedicine application and verified that I am speaking with the correct person using two identifiers.  Patient Location: Home  Provider Location: Home Office  I discussed the limitations of evaluation and management by telemedicine. The patient expressed understanding and agreed to proceed.  Patient Medicare AWV questionnaire was completed by the patient on n/a; I have confirmed that all information answered by patient is correct and no changes since this date.  Review of Systems     Cardiac Risk Factors include: advanced age (>5men, >21 women);dyslipidemia;hypertension;obesity (BMI >30kg/m2)     Objective:    Today's Vitals   05/14/23 1036  Weight: 212 lb (96.2 kg)  Height: 5\' 2"  (1.575 m)   Body mass index is 38.78 kg/m.     05/14/2023   10:36 AM 02/21/2023    9:03 PM 07/01/2022    6:46 AM 02/03/2022    3:16 PM 10/25/2020    8:11 AM 03/15/2020   11:33 AM 04/07/2016    7:47 AM  Advanced Directives  Does Patient Have a Medical Advance Directive? No No No No No No No  Would patient like information on creating a medical advance directive? No - Patient declined  Yes (MAU/Ambulatory/Procedural Areas - Information given)   No - Patient declined  No - patient declined information    Current Medications (verified) Outpatient Encounter Medications as of 05/14/2023  Medication Sig   ALPRAZolam (XANAX) 0.5 MG tablet TAKE 1 TABLET(0.5 MG) BY MOUTH DAILY AS NEEDED FOR ANXIETY   amLODipine (NORVASC) 5 MG tablet Take 1 tablet (5 mg total) by mouth daily.   aspirin EC 81 MG tablet Take 81 mg by mouth daily.    levocetirizine (XYZAL) 5 MG tablet Take 1 tablet (5 mg total) by mouth every evening.   metoprolol succinate (TOPROL-XL) 25 MG 24 hr tablet TAKE 1 TABLET(25 MG) BY MOUTH DAILY   pantoprazole (PROTONIX) 20 MG tablet TAKE 1 TABLET(20 MG) BY MOUTH DAILY   rosuvastatin (CRESTOR) 10 MG tablet Take 1 tablet (10 mg total) by mouth daily.   Accu-Chek Softclix Lancets lancets USE TO CHECK BLOOD GLUCOSE DAILY BEFORE MEALS   blood glucose meter kit and supplies Dispense based on patient and insurance preference. Use to test glucose twice daily  (FOR ICD-10,  E11.9).   fluticasone (FLONASE) 50 MCG/ACT nasal spray Place 2 sprays into both nostrils daily.   glucose blood (ACCU-CHEK GUIDE) test strip USE TO CHECK BLOOD SUGARS DAILY BEFORE MEALS   polyethylene glycol (MIRALAX / GLYCOLAX) 17 g packet Take 17 g by mouth daily.   No facility-administered encounter medications on file as of 05/14/2023.    Allergies (verified) Fosamax [alendronate sodium], Vioxx [rofecoxib], Losartan, and Sulfa antibiotics   History: Past Medical History:  Diagnosis Date   Allergy    Anxiety    Insomnia   Depression    Insomnia   Diabetes mellitus without complication (HCC) 11/28/2020   GERD (gastroesophageal reflux disease)    Herniated disc    Hypertension    Hypothyroidism    not medicated at this time   Osteoporosis    Thyroid disease    Hypothyroidism   Past Surgical History:  Procedure Laterality Date   ABDOMINAL HYSTERECTOMY     COLONOSCOPY N/A 04/07/2016   Procedure: COLONOSCOPY;  Surgeon: Corbin Ade, MD;  Location: [facility]  END[facility];  Service: Endoscopy;  Laterality: N/A;  8:30 AM   LEFT HEART CATH AND CORONARY ANGIOGRAPHY N/A 07/01/2022   Procedure: LEFT HEART CATH AND CORONARY ANGIOGRAPHY;  Surgeon: Lennette Bihari, MD;  Location: [facility] INVASIVE CV LAB;  Service: Cardiovascular;  Laterality: N/A;   TUBAL LIGATION     WRIST SURGERY     Family History  Problem Relation Age of Onset   Diabetes Father    Heart attack Mother    Heart disease Mother    Bladder Cancer Other    Colon cancer Neg Hx    Social History   Socioeconomic History   Marital status: Widowed    Spouse name: Not on file   Number of children: Not on file   Years of education: Not on file   Highest education level: Not on file  Occupational History   Not on file  Tobacco Use   Smoking status: Never    Passive exposure: Yes   Smokeless tobacco: Never  Vaping Use   Vaping status: Never Used  Substance and Sexual Activity   Alcohol use: No    Alcohol/week: 0.0 standard drinks of alcohol   Drug use: No   Sexual activity: Yes  Other Topics Concern   Not on file  Social History Narrative   Not on file   Social Determinants of Health   Financial Resource Strain: Low Risk  (05/14/2023)   Overall Financial Resource Strain (CARDIA)    Difficulty of Paying Living Expenses: Not hard at all  Food Insecurity: No Food Insecurity (05/14/2023)   Hunger Vital Sign    Worried About Running Out of Food in the Last Year: Never true    Ran Out of Food in the Last Year: Never true  Transportation Needs: No Transportation Needs (05/14/2023)   PRAPARE - Administrator, Civil Service (Medical): No    Lack of Transportation (Non-Medical): No  Physical Activity: Sufficiently Active (05/14/2023)   Exercise Vital Sign    Days of Exercise per Week: 7 days    Minutes of Exercise per Session: 30 min  Stress: No Stress Concern Present (05/14/2023)   [employer] of Occupational Health - Occupational Stress Questionnaire    Feeling of  Stress : Not at all  Social Connections: Moderately Integrated (05/14/2023)   Social Connection and Isolation Panel [NHANES]    Frequency of Communication with Friends and Family: More than three times a week    Frequency of Social Gatherings with Friends and Family: More than three times a week    Attends Religious Services: More than 4 times per year    Active Member of [employer] or Organizations: Yes    Attends Banker Meetings: More than 4 times per year    Marital Status: Widowed    Tobacco Counseling Counseling given: Yes   Clinical Intake:  Pre-visit preparation completed: Yes  Pain :  No/denies pain     BMI - recorded: 38.78 Nutritional Status: BMI > 30  Obese Nutritional Risks: None Diabetes: No (per patient she isn't a diabetic)  How often do you need to have someone help you when you read instructions, pamphlets, or other written materials from your doctor or pharmacy?: 1 - Never  Interpreter Needed?: No  Information entered by :: Abby Lindel Marcell, CMA   Activities of Daily Living    05/14/2023   12:30 PM  In your present state of health, do you have any difficulty performing the following activities:  Hearing? 0  Vision? 0  Difficulty concentrating or making decisions? 0  Walking or climbing stairs? 0  Dressing or bathing? 0  Doing errands, shopping? 0  Preparing Food and eating ? N  Using the Toilet? N  In the past six months, have you accidently leaked urine? N  Do you have problems with loss of bowel control? N  Managing your Medications? N  Managing your Finances? N  Housekeeping or managing your Housekeeping? N    Patient Care Team: Tommie Sams, DO as PCP - General (Family Medicine) Rennis Golden Lisette Abu, MD as PCP - Cardiology (Cardiology)  Indicate any recent Medical Services you may have received from other than [facility] providers in the past year (date may be approximate).     Assessment:   This is a routine wellness examination for  [place].  Hearing/Vision screen Hearing Screening - Comments:: Patient denies any hearing difficulties.   Vision Screening - Comments:: Patient denies any vision difficulties and is utd with yearly eye exams  Dietary issues and exercise activities discussed:     Goals Addressed             This Visit's Progress    Patient Stated       Lose weight       Depression Screen    05/14/2023   10:46 AM 04/19/2023   10:10 AM 03/03/2023    8:51 AM 02/05/2023    3:44 PM 12/30/2022    1:28 PM 12/17/2022    9:22 AM 12/09/2022   10:06 AM  PHQ 2/9 Scores  PHQ - 2 Score 1 1 1  0 0  1  PHQ- 9 Score 3  6 2 4  8      Information is confidential and restricted. Go to Review Flowsheets to unlock data.    Fall Risk    05/14/2023   12:29 PM 04/19/2023   10:10 AM 03/03/2023    8:51 AM 02/05/2023    3:44 PM 04/27/2022    2:38 PM  Fall Risk   Falls in the past year? 0 1 0 0 0  Number falls in past yr: 0 0 0    Injury with Fall? 0 0 0    Risk for fall due to : No Fall Risks  No Fall Risks No Fall Risks No Fall Risks  Follow up Falls prevention discussed  Falls evaluation completed Falls evaluation completed Falls evaluation completed    MEDICARE RISK AT HOME:  Medicare Risk at Home - 05/14/23 1228     Any stairs in or around the home? No    If so, are there any without handrails? No    Home free of loose throw rugs in walkways, pet beds, electrical cords, etc? Yes    Adequate lighting in your home to reduce risk of falls? Yes    Life alert? No    Use of a cane, walker or w/c? No  Grab bars in the bathroom? No    Shower chair or bench in shower? No    Elevated toilet seat or a handicapped toilet? No             TIMED UP AND GO:  Was the test performed?  No    Cognitive Function:        05/14/2023   10:42 AM  6CIT Screen  What Year? 0 points  What month? 0 points  What time? 0 points  Count back from 20 0 points  Months in reverse 0 points  Repeat phrase 0 points  Total  Score 0 points    Immunizations Immunization History  Administered Date(s) Administered   Fluad Quad(high Dose 65+) 06/23/2019, 06/25/2022   Influenza,inj,Quad PF,6+ Mos 08/05/2018   Influenza-Unspecified 07/08/2017, 07/29/2019, 06/22/2020, 06/18/2021   Moderna Sars-Covid-2 Vaccination 11/29/2019, 12/27/2019, 08/01/2020, 01/23/2021, 08/06/2021   Pneumococcal Polysaccharide-23 07/28/2019   Tdap 08/05/2018    TDAP status: Up to date  Flu Vaccine status: Due, Education has been provided regarding the importance of this vaccine. Advised may receive this vaccine at local pharmacy or Health Dept. Aware to provide a copy of the vaccination record if obtained from local pharmacy or Health Dept. Verbalized acceptance and understanding.  Pneumococcal vaccine status: Due, Education has been provided regarding the importance of this vaccine. Advised may receive this vaccine at local pharmacy or Health Dept. Aware to provide a copy of the vaccination record if obtained from local pharmacy or Health Dept. Verbalized acceptance and understanding.  Covid-19 vaccine status: Information provided on how to obtain vaccines.   Qualifies for Shingles Vaccine? Yes   Zostavax completed No   Shingrix Completed?: No.    Education has been provided regarding the importance of this vaccine. Patient has been advised to call insurance company to determine out of pocket expense if they have not yet received this vaccine. Advised may also receive vaccine at local pharmacy or Health Dept. Verbalized acceptance and understanding.  Screening Tests Health Maintenance  Topic Date Due   Zoster Vaccines- Shingrix (1 of 2) Never done   Pneumonia Vaccine 50+ Years old (2 of 2 - PCV) 07/27/2020   Medicare Annual Wellness (AWV)  09/25/2022   FOOT EXAM  03/26/2023   INFLUENZA VACCINE  04/29/2023   COVID-19 Vaccine (6 - 2023-24 season) 02/23/2024 (Originally 05/29/2022)   MAMMOGRAM  08/08/2023   HEMOGLOBIN A1C  09/02/2023    OPHTHALMOLOGY EXAM  09/30/2023   DEXA SCAN  10/25/2023   Diabetic kidney evaluation - Urine ACR  11/25/2023   Diabetic kidney evaluation - eGFR measurement  03/02/2024   Colonoscopy  04/07/2026   DTaP/Tdap/Td (2 - Td or Tdap) 08/05/2028   HPV VACCINES  Aged Out   Hepatitis C Screening  Discontinued    Health Maintenance  Health Maintenance Due  Topic Date Due   Zoster Vaccines- Shingrix (1 of 2) Never done   Pneumonia Vaccine 46+ Years old (2 of 2 - PCV) 07/27/2020   Medicare Annual Wellness (AWV)  09/25/2022   FOOT EXAM  03/26/2023   INFLUENZA VACCINE  04/29/2023    Colorectal cancer screening: Type of screening: Colonoscopy. Completed 04/07/2016. Repeat every 10 years  Mammogram status: Completed 08/07/2022. Repeat every year  Bone Density status: Completed 10/24/2021. Results reflect: Bone density results: OSTEOPENIA. Repeat every 2 years.  Lung Cancer Screening: (Low Dose CT Chest recommended if Age 69-80 years, 20 pack-year currently smoking OR have quit w/in 15years.) does not qualify.    Additional Screening:  Hepatitis C Screening: does not qualify;patient declined  Vision Screening: Recommended annual ophthalmology exams for early detection of glaucoma and other disorders of the eye. Is the patient up to date with their annual eye exam?  Yes  Who is the provider or what is the name of the office in which the patient attends annual eye exams? Dr. Charise Killian If pt is not established with a provider, would they like to be referred to a provider to establish care? No .   Dental Screening: Recommended annual dental exams for proper oral hygiene  Diabetic Foot Exam: Diabetic Foot Exam: Overdue, Pt has been advised about the importance in completing this exam. Pt is scheduled for diabetic foot exam on provider notified.  Community Resource Referral / Chronic Care Management: CRR required this visit?  No   CCM required this visit?  No     Plan:     I have personally  reviewed and noted the following in the patient's chart:   Medical and social history Use of alcohol, tobacco or illicit drugs  Current medications and supplements including opioid prescriptions. Patient is not currently taking opioid prescriptions. Functional ability and status Nutritional status Physical activity Advanced directives List of other physicians Hospitalizations, surgeries, and ER visits in previous 12 months Vitals Screenings to include cognitive, depression, and falls Referrals and appointments  In addition, I have reviewed and discussed with patient certain preventive protocols, quality metrics, and best practice recommendations. A written personalized care plan for preventive services as well as general preventive health recommendations were provided to patient.     Jordan Hawks Tam Savoia, CMA   05/14/2023   After Visit Summary: (MyChart) Due to this being a telephonic visit, the after visit summary with patients personalized plan was offered to patient via MyChart   Nurse Notes: Patient due for a diabetic foot exam

## 2023-05-14 NOTE — Patient Instructions (Addendum)
[name] , Thank you for taking time to come for your Medicare Wellness Visit. I appreciate your ongoing commitment to your health goals. Please review the following plan we discussed and let me know if I can assist you in the future.   These are the goals we discussed:  Goals      Patient Stated     Lose weight        This is a list of the screening recommended for you and due dates:  Health Maintenance  Topic Date Due   Zoster (Shingles) Vaccine (1 of 2) Never done   Pneumonia Vaccine (2 of 2 - PCV) 07/27/2020   Complete foot exam   03/26/2023   Flu Shot  04/29/2023   COVID-19 Vaccine (6 - 2023-24 season) 02/23/2024*   Mammogram  08/08/2023   Hemoglobin A1C  09/02/2023   Eye exam for diabetics  09/30/2023   DEXA scan (bone density measurement)  10/25/2023   Yearly kidney health urinalysis for diabetes  11/25/2023   Yearly kidney function blood test for diabetes  03/02/2024   Medicare Annual Wellness Visit  05/13/2024   Colon Cancer Screening  04/07/2026   DTaP/Tdap/Td vaccine (2 - Td or Tdap) 08/05/2028   HPV Vaccine  Aged Out   Hepatitis C Screening  Discontinued  *Topic was postponed. The date shown is not the original due date.    Advanced directives: Advance directive discussed with you today. Even though you declined this today, please call our office should you change your mind, and we can give you the proper paperwork for you to fill out. Advance care planning is a way to make decisions about medical care that fits your values in case you are ever unable to make these decisions for yourself.  Information on Advanced Care Planning can be found at [facility] Advance Health Care Directives Advance Health Care Directives ([email or web address]    Conditions/risks identified: You are due for a diabetic foot exam.   Next appointment: VIRTUAL/TELEPHONE APPOINTMENT Follow up in one year for your annual wellness visit  May 19, 2024 at 2:30 pm telephone  visit.    Preventive Care 69 Years and Older, Female Preventive care refers to lifestyle choices and visits with your health care provider that can promote health and wellness. What does preventive care include? A yearly physical exam. This is also called an annual well check. Dental exams once or twice a year. Routine eye exams. Ask your health care provider how often you should have your eyes checked. Personal lifestyle choices, including: Daily care of your teeth and gums. Regular physical activity. Eating a healthy diet. Avoiding tobacco and drug use. Limiting alcohol use. Practicing safe sex. Taking low-dose aspirin every day. Taking vitamin and mineral supplements as recommended by your health care provider. What happens during an annual well check? The services and screenings done by your health care provider during your annual well check will depend on your age, overall health, lifestyle risk factors, and family history of disease. Counseling  Your health care provider may ask you questions about your: Alcohol use. Tobacco use. Drug use. Emotional well-being. Home and relationship well-being. Sexual activity. Eating habits. History of falls. Memory and ability to understand (cognition). Work and work Astronomer. Reproductive health. Screening  You may have the following tests or measurements: Height, weight, and BMI. Blood pressure. Lipid and cholesterol levels. These may be checked every 5 years, or more frequently if you are over 61 years old. Skin  check. Lung cancer screening. You may have this screening every year starting at age 57 if you have a 30-pack-year history of smoking and currently smoke or have quit within the past 15 years. Fecal occult blood test (FOBT) of the stool. You may have this test every year starting at age 45. Flexible sigmoidoscopy or colonoscopy. You may have a sigmoidoscopy every 5 years or a colonoscopy every 10 years starting at age  53. Hepatitis C blood test. Hepatitis B blood test. Sexually transmitted disease (STD) testing. Diabetes screening. This is done by checking your blood sugar (glucose) after you have not eaten for a while (fasting). You may have this done every 1-3 years. Bone density scan. This is done to screen for osteoporosis. You may have this done starting at age 15. Mammogram. This may be done every 1-2 years. Talk to your health care provider about how often you should have regular mammograms. Talk with your health care provider about your test results, treatment options, and if necessary, the need for more tests. Vaccines  Your health care provider may recommend certain vaccines, such as: Influenza vaccine. This is recommended every year. Tetanus, diphtheria, and acellular pertussis (Tdap, Td) vaccine. You may need a Td booster every 10 years. Zoster vaccine. You may need this after age 78. Pneumococcal 13-valent conjugate (PCV13) vaccine. One dose is recommended after age 58. Pneumococcal polysaccharide (PPSV23) vaccine. One dose is recommended after age 32. Talk to your health care provider about which screenings and vaccines you need and how often you need them. This information is not intended to replace advice given to you by your health care provider. Make sure you discuss any questions you have with your health care provider. Document Released: 10/11/2015 Document Revised: 06/03/2016 Document Reviewed: 07/16/2015 Elsevier Interactive Patient Education  2017 [employer].  Fall Prevention in the Home Falls can cause injuries. They can happen to people of all ages. There are many things you can do to make your home safe and to help prevent falls. What can I do on the outside of my home? Regularly fix the edges of walkways and driveways and fix any cracks. Remove anything that might make you trip as you walk through a door, such as a raised step or threshold. Trim any bushes or trees on the  path to your home. Use bright outdoor lighting. Clear any walking paths of anything that might make someone trip, such as rocks or tools. Regularly check to see if handrails are loose or broken. Make sure that both sides of any steps have handrails. Any raised decks and porches should have guardrails on the edges. Have any leaves, snow, or ice cleared regularly. Use sand or salt on walking paths during winter. Clean up any spills in your garage right away. This includes oil or grease spills. What can I do in the bathroom? Use night lights. Install grab bars by the toilet and in the tub and shower. Do not use towel bars as grab bars. Use non-skid mats or decals in the tub or shower. If you need to sit down in the shower, use a plastic, non-slip stool. Keep the floor dry. Clean up any water that spills on the floor as soon as it happens. Remove soap buildup in the tub or shower regularly. Attach bath mats securely with double-sided non-slip rug tape. Do not have throw rugs and other things on the floor that can make you trip. What can I do in the bedroom? Use night lights. Make  sure that you have a light by your bed that is easy to reach. Do not use any sheets or blankets that are too big for your bed. They should not hang down onto the floor. Have a firm chair that has side arms. You can use this for support while you get dressed. Do not have throw rugs and other things on the floor that can make you trip. What can I do in the kitchen? Clean up any spills right away. Avoid walking on wet floors. Keep items that you use a lot in easy-to-reach places. If you need to reach something above you, use a strong step stool that has a grab bar. Keep electrical cords out of the way. Do not use floor polish or wax that makes floors slippery. If you must use wax, use non-skid floor wax. Do not have throw rugs and other things on the floor that can make you trip. What can I do with my stairs? Do not  leave any items on the stairs. Make sure that there are handrails on both sides of the stairs and use them. Fix handrails that are broken or loose. Make sure that handrails are as long as the stairways. Check any carpeting to make sure that it is firmly attached to the stairs. Fix any carpet that is loose or worn. Avoid having throw rugs at the top or bottom of the stairs. If you do have throw rugs, attach them to the floor with carpet tape. Make sure that you have a light switch at the top of the stairs and the bottom of the stairs. If you do not have them, ask someone to add them for you. What else can I do to help prevent falls? Wear shoes that: Do not have high heels. Have rubber bottoms. Are comfortable and fit you well. Are closed at the toe. Do not wear sandals. If you use a stepladder: Make sure that it is fully opened. Do not climb a closed stepladder. Make sure that both sides of the stepladder are locked into place. Ask someone to hold it for you, if possible. Clearly mark and make sure that you can see: Any grab bars or handrails. First and last steps. Where the edge of each step is. Use tools that help you move around (mobility aids) if they are needed. These include: Canes. Walkers. Scooters. Crutches. Turn on the lights when you go into a dark area. Replace any light bulbs as soon as they burn out. Set up your furniture so you have a clear path. Avoid moving your furniture around. If any of your floors are uneven, fix them. If there are any pets around you, be aware of where they are. Review your medicines with your doctor. Some medicines can make you feel dizzy. This can increase your chance of falling. Ask your doctor what other things that you can do to help prevent falls. This information is not intended to replace advice given to you by your health care provider. Make sure you discuss any questions you have with your health care provider. Document Released:  07/11/2009 Document Revised: 02/20/2016 Document Reviewed: 10/19/2014 Elsevier Interactive Patient Education  2017 [employer].

## 2023-05-18 ENCOUNTER — Telehealth: Payer: Self-pay | Admitting: Family Medicine

## 2023-05-18 ENCOUNTER — Other Ambulatory Visit: Payer: Self-pay | Admitting: Family Medicine

## 2023-05-18 DIAGNOSIS — I10 Essential (primary) hypertension: Secondary | ICD-10-CM

## 2023-05-18 NOTE — Telephone Encounter (Signed)
Patient would like to get her potassium check again feeling tired and weak again

## 2023-05-19 NOTE — Telephone Encounter (Signed)
Informed pt of labs being ordered

## 2023-05-20 DIAGNOSIS — I10 Essential (primary) hypertension: Secondary | ICD-10-CM | POA: Diagnosis not present

## 2023-05-21 LAB — BASIC METABOLIC PANEL
BUN/Creatinine Ratio: 13 (ref 12–28)
BUN: 9 mg/dL (ref 8–27)
CO2: 25 mmol/L (ref 20–29)
Calcium: 9.1 mg/dL (ref 8.7–10.3)
Chloride: 106 mmol/L (ref 96–106)
Creatinine, Ser: 0.71 mg/dL (ref 0.57–1.00)
Glucose: 108 mg/dL — ABNORMAL HIGH (ref 70–99)
Potassium: 4.5 mmol/L (ref 3.5–5.2)
Sodium: 143 mmol/L (ref 134–144)
eGFR: 92 mL/min/{1.73_m2} (ref >59)

## 2023-05-23 NOTE — H&P (View-Only) (Signed)
GI Office Note    Referring Provider: Tommie Sams, DO Primary Care Physician:  Tommie Sams, DO Primary Gastroenterologist: Dolores Frame, MD   Date:  05/24/2023  ID:  Jessica Kirby, DOB 15-May-1956, MRN [id number]   Chief Complaint   Chief Complaint  Patient presents with   Follow-up    Pt following up on her  swallowing. She states pills still not going down easy   History of Present Illness  Jessica Kirby is a 67 y.o. female with a history of constipation, GERD, diabetes, hypothyroidism, anxiety, and depression presenting today with ongoing dysphagia.   Last colonoscopy in 2017: -Sigmoid and descending diverticulosis   Office visit 10/15/2022.  Noted decrease in regurgitation issues since she came off potassium pills continue Protonix.  Previously takes this about twice per week when she eats something she knows she should not.  Has heartburn symptoms occasionally.  Denies any dysphagia or odynophagia, nausea vomiting, early satiety.  Tries to avoid spicy foods, caffeine, greasy foods.  Struggling with some constipation during a bowel movement once a day but occasionally she may skip a day.  Feels that this may be related to HCTZ.  Stools hard at times, takes MiraLAX as needed but makes her feel bloated.  Has tried increasing fiber in diet.  Occasionally has to strain. Advised PPI as needed and reflux precautions.  Start Benefiber 1 tablespoon twice daily with meals and for 64 ounces of water daily and increase fiber in her diet.   Office visit 02/17/23.  Reported frequent nightly throat irritation patient is getting stuck in her throat at times.  Denied any odynophagia or hoarseness.  Also having issues with cough at night around 4-5 PM.  Usually takes her multivitamin with applesauce.  Also having a frequent dry cough.  Reports stools have been a lighter color but usually brown.  Stools have been hard at times. Advised Carafate for 2 weeks, low dose PPI once daily.  Applesauce for medications. Daily stool softener and miralax as needed. High fiber diet and benefiber.    ED visit in May for atypical chest pain and found to have low potassium and EKG with NSR. Followed up with pcp and BP medications changed. (Hydrochlorothiazide and valsartan stopped and started on amlodipine and continued on metoprolol)   Potassium stable on 03/03/23. LFTs also normal.   Last office visit 03/29/23. Ongoing constipation with hard stools and straining. Reporting pill dysphagia and regurgitation. Taking medication with applesauce. Powdery feeling on tongue causing need to drink many fluids. Thinks stools had been white at time due to certain medications. BPE ordered. Advised colace daily for constipation and miralax once weekly. Encouraged daily use of PPI.   BPE 04/02/23: no stricture or mass identified. No hernia. (EGD offered - patient preferred to monitor and discuss at follow up)   Today: GERD, dysphagia - She states by the end of the day her medications she is able to taste t come back . Some cantaloupe has felt stuck and at times her pills feel stuck almost between the breasts. If she eats the wrong thing she will have acid production regurgitate at night. Tries to avoid fried foods and spicy foods. She knows pizza used to bother her. Does not get hungry at night.   Constipation - Has been using miralax a couple times per week and has been trying to increase her fiber. Wants to try to increase her usage of Miralax to 3-4 times. Tries to drink water. Miralax she  usually takes at night. BM mostly once per day but stools have been hard and bulky and dry. No abdominal pain. No melena or brbpr.   Current Outpatient Medications  Medication Sig Dispense Refill   Accu-Chek Softclix Lancets lancets USE TO CHECK BLOOD GLUCOSE DAILY BEFORE MEALS 200 each 0   ALPRAZolam (XANAX) 0.5 MG tablet TAKE 1 TABLET(0.5 MG) BY MOUTH DAILY AS NEEDED FOR ANXIETY 30 tablet 3   amLODipine (NORVASC) 5 MG  tablet Take 1 tablet (5 mg total) by mouth daily. 90 tablet 3   aspirin EC 81 MG tablet Take 81 mg by mouth daily.      blood glucose meter kit and supplies Dispense based on patient and insurance preference. Use to test glucose twice daily  (FOR ICD-10,  E11.9). 1 each 5   fluticasone (FLONASE) 50 MCG/ACT nasal spray Place 2 sprays into both nostrils daily. 16 g 6   glucose blood (ACCU-CHEK GUIDE) test strip USE TO CHECK BLOOD SUGARS DAILY BEFORE MEALS 100 strip 3   levocetirizine (XYZAL) 5 MG tablet Take 1 tablet (5 mg total) by mouth every evening. 90 tablet 3   metoprolol succinate (TOPROL-XL) 25 MG 24 hr tablet TAKE 1 TABLET(25 MG) BY MOUTH DAILY 90 tablet 3   pantoprazole (PROTONIX) 20 MG tablet TAKE 1 TABLET(20 MG) BY MOUTH DAILY 90 tablet 3   polyethylene glycol (MIRALAX / GLYCOLAX) 17 g packet Take 17 g by mouth daily.     rosuvastatin (CRESTOR) 10 MG tablet Take 1 tablet (10 mg total) by mouth daily. 90 tablet 3   No current facility-administered medications for this visit.    Past Medical History:  Diagnosis Date   Allergy    Anxiety    Insomnia   Depression    Insomnia   Diabetes mellitus without complication (HCC) 11/28/2020   GERD (gastroesophageal reflux disease)    Herniated disc    Hypertension    Hypothyroidism    not medicated at this time   Osteoporosis    Thyroid disease    Hypothyroidism    Past Surgical History:  Procedure Laterality Date   ABDOMINAL HYSTERECTOMY     COLONOSCOPY N/A 04/07/2016   Procedure: COLONOSCOPY;  Surgeon: Corbin Ade, MD;  Location: [facility];  Service: Endoscopy;  Laterality: N/A;  8:30 AM   LEFT HEART CATH AND CORONARY ANGIOGRAPHY N/A 07/01/2022   Procedure: LEFT HEART CATH AND CORONARY ANGIOGRAPHY;  Surgeon: Lennette Bihari, MD;  Location: [facility] INVASIVE CV LAB;  Service: Cardiovascular;  Laterality: N/A;   TUBAL LIGATION     WRIST SURGERY      Family History  Problem Relation Age of Onset   Diabetes Father    Heart  attack Mother    Heart disease Mother    Bladder Cancer Other    Colon cancer Neg Hx     Allergies as of 05/24/2023 - Review Complete 05/24/2023  Allergen Reaction Noted   Fosamax [alendronate sodium] Other (See Comments) 12/21/2012   Vioxx [rofecoxib]  12/21/2012   Losartan Cough 06/13/2015   Sulfa antibiotics Hives 09/10/2011    Social History   Socioeconomic History   Marital status: Widowed    Spouse name: Not on file   Number of children: Not on file   Years of education: Not on file   Highest education level: Not on file  Occupational History   Not on file  Tobacco Use   Smoking status: Never    Passive exposure: Yes   Smokeless  tobacco: Never  Vaping Use   Vaping status: Never Used  Substance and Sexual Activity   Alcohol use: No    Alcohol/week: 0.0 standard drinks of alcohol   Drug use: No   Sexual activity: Yes  Other Topics Concern   Not on file  Social History Narrative   Not on file   Social Determinants of Health   Financial Resource Strain: Low Risk  (05/14/2023)   Overall Financial Resource Strain (CARDIA)    Difficulty of Paying Living Expenses: Not hard at all  Food Insecurity: No Food Insecurity (05/14/2023)   Hunger Vital Sign    Worried About Running Out of Food in the Last Year: Never true    Ran Out of Food in the Last Year: Never true  Transportation Needs: No Transportation Needs (05/14/2023)   PRAPARE - Administrator, Civil Service (Medical): No    Lack of Transportation (Non-Medical): No  Physical Activity: Sufficiently Active (05/14/2023)   Exercise Vital Sign    Days of Exercise per Week: 7 days    Minutes of Exercise per Session: 30 min  Stress: No Stress Concern Present (05/14/2023)   [employer] of Occupational Health - Occupational Stress Questionnaire    Feeling of Stress : Not at all  Social Connections: Moderately Integrated (05/14/2023)   Social Connection and Isolation Panel [NHANES]    Frequency of  Communication with Friends and Family: More than three times a week    Frequency of Social Gatherings with Friends and Family: More than three times a week    Attends Religious Services: More than 4 times per year    Active Member of [employer] or Organizations: Yes    Attends Banker Meetings: More than 4 times per year    Marital Status: Widowed     Review of Systems   Gen: Denies fever, chills, anorexia. Denies fatigue, weakness, weight loss.  CV: Denies chest pain, palpitations, syncope, peripheral edema, and claudication. Resp: Denies dyspnea at rest, cough, wheezing, coughing up blood, and pleurisy. GI: See HPI Derm: Denies rash, itching, dry skin Psych: Denies depression, anxiety, memory loss, confusion. No homicidal or suicidal ideation.  Heme: Denies bruising, bleeding, and enlarged lymph nodes.   Physical Exam   BP (!) 145/86   Pulse 71   Temp 98.6 F (37 C)   Ht 5\' 2"  (1.575 m)   Wt 215 lb 9.6 oz (97.8 kg)   BMI 39.43 kg/m   General:   Alert and oriented. No distress noted. Pleasant and cooperative.  Head:  Normocephalic and atraumatic. Eyes:  Conjuctiva clear without scleral icterus. Mouth:  Oral mucosa pink and moist. Good dentition. No lesions. Lungs:  Clear to auscultation bilaterally. No wheezes, rales, or rhonchi. No distress.  Heart:  S1, S2 present without murmurs appreciated.  Abdomen:  +BS, soft, non-tender and non-distended. No rebound or guarding. No HSM or masses noted. Rectal: deferred Msk:  Symmetrical without gross deformities. Normal posture. Extremities: Nonpitting ankle edema bilaterally. Neurologic:  Alert and  oriented x4 Psych:  Alert and cooperative. Normal mood and affect.  Assessment  Jessica Kirby is a 67 y.o. female with a history of constipation, GERD, diabetes, hypothyroidism, anxiety, and depression presenting today with ongoing dysphagia.   Constipation: Typically has a bowel movement daily however stools continue to  be hard, bulky, and dry.  Has been using MiraLAX 1-2 times a week and Colace once daily.  We will increase Colace to twice daily and increase MiraLAX  to 3-4 times a week, or every other day.  Also encouraged increased water intake which she has been trying.  She has also been adding fiber to her diet.  GERD, dysphagia: Cough has improved since stopping valsartan and HCTZ.  Continues to have feelings of pills getting stuck in the midsternal area.  Initially denied any significant reflux however she states at nighttime she is experiencing some intermittent burning with acid regurgitation.  BPE without evidence of any stricture, mass, or hernia.  We started low-dose pantoprazole 20 mg once daily and she has not noticed any significant improvement.  Previously discussed upper endoscopy with dilation which we again addressed today and she has agreed to proceed.  If she continues to have issues with dysphagia post EGD and dilation we may need to consider evaluation by speech therapy.  For now we will increase PPI to twice daily, she has reservations on ongoing use given potential long-term side effects including dementia and osteoporosis.  PLAN   Increase pantoprazole to 20 mg twice daily Proceed with upper endoscopy with dilation with propofol by Dr. Levon Hedger in near future: the risks, benefits, and alternatives have been discussed with the patient in detail. The patient states understanding and desires to proceed. ASA 2 Increase miralax to 3-4 times per week Increase colace to twice daily.  GERD diet High-fiber diet. Chewing precautions discussed.  Follow up in 3 months.     Brooke Bonito, MSN, FNP-BC, AGACNP-BC [facility]  I have reviewed the note and agree with the APP's assessment as described in this progress note  Katrinka Blazing, MD [facility]ology and [facility]

## 2023-05-23 NOTE — Progress Notes (Unsigned)
GI Office Note    Referring Provider: Tommie Sams, DO Primary Care Physician:  Tommie Sams, DO Primary Gastroenterologist: Dolores Frame, MD   Date:  05/24/2023  ID:  Jessica Kirby, DOB 10-11-1955, MRN [id number]   Chief Complaint   Chief Complaint  Patient presents with   Follow-up    Pt following up on her  swallowing. She states pills still not going down easy   History of Present Illness  Jessica Kirby is a 67 y.o. female with a history of constipation, GERD, diabetes, hypothyroidism, anxiety, and depression presenting today with ongoing dysphagia.   Last colonoscopy in 2017: -Sigmoid and descending diverticulosis   Office visit 10/15/2022.  Noted decrease in regurgitation issues since she came off potassium pills continue Protonix.  Previously takes this about twice per week when she eats something she knows she should not.  Has heartburn symptoms occasionally.  Denies any dysphagia or odynophagia, nausea vomiting, early satiety.  Tries to avoid spicy foods, caffeine, greasy foods.  Struggling with some constipation during a bowel movement once a day but occasionally she may skip a day.  Feels that this may be related to HCTZ.  Stools hard at times, takes MiraLAX as needed but makes her feel bloated.  Has tried increasing fiber in diet.  Occasionally has to strain. Advised PPI as needed and reflux precautions.  Start Benefiber 1 tablespoon twice daily with meals and for 64 ounces of water daily and increase fiber in her diet.   Office visit 02/17/23.  Reported frequent nightly throat irritation patient is getting stuck in her throat at times.  Denied any odynophagia or hoarseness.  Also having issues with cough at night around 4-5 PM.  Usually takes her multivitamin with applesauce.  Also having a frequent dry cough.  Reports stools have been a lighter color but usually brown.  Stools have been hard at times. Advised Carafate for 2 weeks, low dose PPI once daily.  Applesauce for medications. Daily stool softener and miralax as needed. High fiber diet and benefiber.    ED visit in May for atypical chest pain and found to have low potassium and EKG with NSR. Followed up with pcp and BP medications changed. (Hydrochlorothiazide and valsartan stopped and started on amlodipine and continued on metoprolol)   Potassium stable on 03/03/23. LFTs also normal.   Last office visit 03/29/23. Ongoing constipation with hard stools and straining. Reporting pill dysphagia and regurgitation. Taking medication with applesauce. Powdery feeling on tongue causing need to drink many fluids. Thinks stools had been white at time due to certain medications. BPE ordered. Advised colace daily for constipation and miralax once weekly. Encouraged daily use of PPI.   BPE 04/02/23: no stricture or mass identified. No hernia. (EGD offered - patient preferred to monitor and discuss at follow up)   Today: GERD, dysphagia - She states by the end of the day her medications she is able to taste t come back . Some cantaloupe has felt stuck and at times her pills feel stuck almost between the breasts. If she eats the wrong thing she will have acid production regurgitate at night. Tries to avoid fried foods and spicy foods. She knows pizza used to bother her. Does not get hungry at night.   Constipation - Has been using miralax a couple times per week and has been trying to increase her fiber. Wants to try to increase her usage of Miralax to 3-4 times. Tries to drink water. Miralax she  usually takes at night. BM mostly once per day but stools have been hard and bulky and dry. No abdominal pain. No melena or brbpr.   Current Outpatient Medications  Medication Sig Dispense Refill   Accu-Chek Softclix Lancets lancets USE TO CHECK BLOOD GLUCOSE DAILY BEFORE MEALS 200 each 0   ALPRAZolam (XANAX) 0.5 MG tablet TAKE 1 TABLET(0.5 MG) BY MOUTH DAILY AS NEEDED FOR ANXIETY 30 tablet 3   amLODipine (NORVASC) 5 MG  tablet Take 1 tablet (5 mg total) by mouth daily. 90 tablet 3   aspirin EC 81 MG tablet Take 81 mg by mouth daily.      blood glucose meter kit and supplies Dispense based on patient and insurance preference. Use to test glucose twice daily  (FOR ICD-10,  E11.9). 1 each 5   fluticasone (FLONASE) 50 MCG/ACT nasal spray Place 2 sprays into both nostrils daily. 16 g 6   glucose blood (ACCU-CHEK GUIDE) test strip USE TO CHECK BLOOD SUGARS DAILY BEFORE MEALS 100 strip 3   levocetirizine (XYZAL) 5 MG tablet Take 1 tablet (5 mg total) by mouth every evening. 90 tablet 3   metoprolol succinate (TOPROL-XL) 25 MG 24 hr tablet TAKE 1 TABLET(25 MG) BY MOUTH DAILY 90 tablet 3   pantoprazole (PROTONIX) 20 MG tablet TAKE 1 TABLET(20 MG) BY MOUTH DAILY 90 tablet 3   polyethylene glycol (MIRALAX / GLYCOLAX) 17 g packet Take 17 g by mouth daily.     rosuvastatin (CRESTOR) 10 MG tablet Take 1 tablet (10 mg total) by mouth daily. 90 tablet 3   No current facility-administered medications for this visit.    Past Medical History:  Diagnosis Date   Allergy    Anxiety    Insomnia   Depression    Insomnia   Diabetes mellitus without complication (HCC) 11/28/2020   GERD (gastroesophageal reflux disease)    Herniated disc    Hypertension    Hypothyroidism    not medicated at this time   Osteoporosis    Thyroid disease    Hypothyroidism    Past Surgical History:  Procedure Laterality Date   ABDOMINAL HYSTERECTOMY     COLONOSCOPY N/A 04/07/2016   Procedure: COLONOSCOPY;  Surgeon: Corbin Ade, MD;  Location: [facility];  Service: Endoscopy;  Laterality: N/A;  8:30 AM   LEFT HEART CATH AND CORONARY ANGIOGRAPHY N/A 07/01/2022   Procedure: LEFT HEART CATH AND CORONARY ANGIOGRAPHY;  Surgeon: Lennette Bihari, MD;  Location: [facility];  Service: Cardiovascular;  Laterality: N/A;   TUBAL LIGATION     WRIST SURGERY      Family History  Problem Relation Age of Onset   Diabetes Father    Heart  attack Mother    Heart disease Mother    Bladder Cancer Other    Colon cancer Neg Hx     Allergies as of 05/24/2023 - Review Complete 05/24/2023  Allergen Reaction Noted   Fosamax [alendronate sodium] Other (See Comments) 12/21/2012   Vioxx [rofecoxib]  12/21/2012   Losartan Cough 06/13/2015   Sulfa antibiotics Hives 09/10/2011    Social History   Socioeconomic History   Marital status: Widowed    Spouse name: Not on file   Number of children: Not on file   Years of education: Not on file   Highest education level: Not on file  Occupational History   Not on file  Tobacco Use   Smoking status: Never    Passive exposure: Yes   Smokeless  tobacco: Never  Vaping Use   Vaping status: Never Used  Substance and Sexual Activity   Alcohol use: No    Alcohol/week: 0.0 standard drinks of alcohol   Drug use: No   Sexual activity: Yes  Other Topics Concern   Not on file  Social History Narrative   Not on file   Social Determinants of Health   Financial Resource Strain: Low Risk  (05/14/2023)   Overall Financial Resource Strain (CARDIA)    Difficulty of Paying Living Expenses: Not hard at all  Food Insecurity: No Food Insecurity (05/14/2023)   Hunger Vital Sign    Worried About Running Out of Food in the Last Year: Never true    Ran Out of Food in the Last Year: Never true  Transportation Needs: No Transportation Needs (05/14/2023)   PRAPARE - Administrator, Civil Service (Medical): No    Lack of Transportation (Non-Medical): No  Physical Activity: Sufficiently Active (05/14/2023)   Exercise Vital Sign    Days of Exercise per Week: 7 days    Minutes of Exercise per Session: 30 min  Stress: No Stress Concern Present (05/14/2023)   Jessica Kirby [employer] Occupational [employer] - Occupational Stress Questionnaire    Feeling of Stress : Not at all  Social Connections: Moderately Integrated (05/14/2023)   Social Connection and Isolation Panel [NHANES]    Frequency of  Communication with Friends and Family: More than three times a week    Frequency of Social Gatherings with Friends and Family: More than three times a week    Attends Religious Services: More than 4 times per year    Active Member of [employer] or Organizations: Yes    Attends Banker Meetings: More than 4 times per year    Marital Status: Widowed     Review of Systems   Gen: Denies fever, chills, anorexia. Denies fatigue, weakness, weight loss.  CV: Denies chest pain, palpitations, syncope, peripheral edema, and claudication. Resp: Denies dyspnea at rest, cough, wheezing, coughing up blood, and pleurisy. GI: See HPI Derm: Denies rash, itching, dry skin Psych: Denies depression, anxiety, memory loss, confusion. No homicidal or suicidal ideation.  Heme: Denies bruising, bleeding, and enlarged lymph nodes.   Physical Exam   BP (!) 145/86   Pulse 71   Temp 98.6 F (37 C)   Ht 5\' 2"  (1.575 m)   Wt 215 lb 9.6 oz (97.8 kg)   BMI 39.43 kg/m   General:   Alert and oriented. No distress noted. Pleasant and cooperative.  Head:  Normocephalic and atraumatic. Eyes:  Conjuctiva clear without scleral icterus. Mouth:  Oral mucosa pink and moist. Good dentition. No lesions. Lungs:  Clear to auscultation bilaterally. No wheezes, rales, or rhonchi. No distress.  Heart:  S1, S2 present without murmurs appreciated.  Abdomen:  +BS, soft, non-tender and non-distended. No rebound or guarding. No HSM or masses noted. Rectal: deferred Msk:  Symmetrical without gross deformities. Normal posture. Extremities: Nonpitting ankle edema bilaterally. Neurologic:  Alert and  oriented x4 Psych:  Alert and cooperative. Normal mood and affect.  Assessment  KELAYA CRAGHEAD is a 67 y.o. female with a history of constipation, GERD, diabetes, hypothyroidism, anxiety, and depression presenting today with ongoing dysphagia.   Constipation: Typically has a bowel movement daily however stools continue to  be hard, bulky, and dry.  Has been using MiraLAX 1-2 times a week and Colace once daily.  We will increase Colace to twice daily and increase MiraLAX  to 3-4 times a week, or every other day.  Also encouraged increased water intake which she has been trying.  She has also been adding fiber to her diet.  GERD, dysphagia: Cough has improved since stopping valsartan and HCTZ.  Continues to have feelings of pills getting stuck in the midsternal area.  Initially denied any significant reflux however she states at nighttime she is experiencing some intermittent burning with acid regurgitation.  BPE without evidence of any stricture, mass, or hernia.  We started low-dose pantoprazole 20 mg once daily and she has not noticed any significant improvement.  Previously discussed upper endoscopy with dilation which we again addressed today and she has agreed to proceed.  If she continues to have issues with dysphagia post EGD and dilation we may need to consider evaluation by speech therapy.  For now we will increase PPI to twice daily, she has reservations on ongoing use given potential long-term side effects including dementia and osteoporosis.  PLAN   Increase pantoprazole to 20 mg twice daily Proceed with upper endoscopy with dilation with propofol by Dr. Levon Hedger in near future: the risks, benefits, and alternatives have been discussed with the patient in detail. The patient states understanding and desires to proceed. ASA 2 Increase miralax to 3-4 times per week Increase colace to twice daily.  GERD diet High-fiber diet. Chewing precautions discussed.  Follow up in 3 months.     Brooke Bonito, MSN, FNP-BC, AGACNP-BC [facility]  I have reviewed the note and agree with the APP's assessment as described in this progress note  Katrinka Blazing, MD Gastroenterology and Hepatology [facility]

## 2023-05-24 ENCOUNTER — Ambulatory Visit: Payer: Medicare Other | Admitting: Gastroenterology

## 2023-05-24 ENCOUNTER — Other Ambulatory Visit: Payer: Self-pay | Admitting: Family Medicine

## 2023-05-24 ENCOUNTER — Encounter: Payer: Self-pay | Admitting: Gastroenterology

## 2023-05-24 VITALS — BP 145/86 | HR 71 | Temp 98.6°F | Ht 62.0 in | Wt 215.6 lb

## 2023-05-24 DIAGNOSIS — K5904 Chronic idiopathic constipation: Secondary | ICD-10-CM | POA: Diagnosis not present

## 2023-05-24 DIAGNOSIS — R1310 Dysphagia, unspecified: Secondary | ICD-10-CM

## 2023-05-24 DIAGNOSIS — E785 Hyperlipidemia, unspecified: Secondary | ICD-10-CM

## 2023-05-24 DIAGNOSIS — K219 Gastro-esophageal reflux disease without esophagitis: Secondary | ICD-10-CM

## 2023-05-24 NOTE — Patient Instructions (Addendum)
Start taking your pantoprazole 20 mg once in the morning before breakfast and at least 30 minutes to an hour before dinner.  We will get you scheduled for an upper endoscopy in the near future with Dr. Levon Hedger.  Continue your MiraLAX.  I would like for you to increase this to 3-4 times per week (this would average out to about every other day).  Continue taking your Colace and you can increase this to twice daily as well to help with your harder stools.  Follow a GERD diet:  Avoid fried, fatty, greasy, spicy, citrus foods. Avoid caffeine and carbonated beverages. Avoid chocolate. Try eating 4-6 small meals a day rather than 3 large meals. Do not eat within 3 hours of laying down. Prop head of bed up on wood or bricks to create a 6 inch incline.  You can continue taking medications with applesauce if you need to.  Continue eating slower and ensuring adequate time of chewing between bites and swallowing.  I will see you in the office for follow-up after your procedure.  It was a pleasure to see you today. I want to create trusting relationships with patients. If you receive a survey regarding your visit,  I greatly appreciate you taking time to fill this out on paper or through your MyChart. I value your feedback.  Brooke Bonito, MSN, FNP-BC, AGACNP-BC [facility]

## 2023-05-25 ENCOUNTER — Encounter: Payer: Self-pay | Admitting: *Deleted

## 2023-05-25 ENCOUNTER — Telehealth: Payer: Self-pay | Admitting: *Deleted

## 2023-05-25 NOTE — Telephone Encounter (Signed)
[facility]  EGD/ED w/Dr.Castaneda, ASA 2, needs BMET

## 2023-05-25 NOTE — Telephone Encounter (Signed)
Pt has been scheduled for 06/02/23. Instructions printed and pt to come by office to pick up. Pt had BMET on 05/20/23.

## 2023-05-26 NOTE — Telephone Encounter (Signed)
Pt left vm stating she had a couple of questions.  Called pt and she wanted to know if she had to go to hospital for pre-op visit and how much of a co-pay she would have to pay. Advised pt that she does not have to go for a pre-op and we check insurance to see if it needs a PA. Gave pt number to pre-service center.

## 2023-05-26 NOTE — Telephone Encounter (Signed)
[employer] PA: Notification or Prior Authorization is not required for the requested services You are not required to submit a notification/prior authorization based on the information provided. If you have general questions about the prior authorization requirements, visit [email or web address] > Clinician Resources > Advance and Admission Notification Requirements. The number above acknowledges your notification. Please write this reference number down for future reference. If you would like to request an organization determination, please call us at [phone]. Decision ID #: [id number]

## 2023-06-01 ENCOUNTER — Telehealth (INDEPENDENT_AMBULATORY_CARE_PROVIDER_SITE_OTHER): Payer: Self-pay | Admitting: Gastroenterology

## 2023-06-01 NOTE — Telephone Encounter (Signed)
Pt left voicemail in regards to what medications she can take.  Attempted to return call to pt to get clarification but had to leave message to return call

## 2023-06-01 NOTE — Telephone Encounter (Signed)
Pt left message returning call.  Returned call to patient and went over what medications pt can and can not take for upcoming EGD

## 2023-06-03 ENCOUNTER — Encounter (HOSPITAL_COMMUNITY)
Admission: RE | Disposition: A | Discharge status: Home / Self Care | Payer: Self-pay | Source: Ambulatory Visit | Attending: Gastroenterology

## 2023-06-03 ENCOUNTER — Ambulatory Visit (HOSPITAL_COMMUNITY)
Admission: RE | Admit: 2023-06-03 | Discharge: 2023-06-03 | Disposition: A | Discharge status: Home / Self Care | Payer: Medicare Other | Source: Ambulatory Visit | Attending: Gastroenterology | Admitting: Gastroenterology

## 2023-06-03 ENCOUNTER — Ambulatory Visit (HOSPITAL_BASED_OUTPATIENT_CLINIC_OR_DEPARTMENT_OTHER): Payer: Medicare Other | Admitting: Anesthesiology

## 2023-06-03 ENCOUNTER — Other Ambulatory Visit: Payer: Self-pay

## 2023-06-03 ENCOUNTER — Ambulatory Visit (HOSPITAL_COMMUNITY): Payer: Medicare Other | Admitting: Anesthesiology

## 2023-06-03 ENCOUNTER — Encounter (HOSPITAL_COMMUNITY): Payer: Self-pay | Admitting: Gastroenterology

## 2023-06-03 DIAGNOSIS — F419 Anxiety disorder, unspecified: Secondary | ICD-10-CM | POA: Diagnosis not present

## 2023-06-03 DIAGNOSIS — I1310 Hypertensive heart and chronic kidney disease without heart failure, with stage 1 through stage 4 chronic kidney disease, or unspecified chronic kidney disease: Secondary | ICD-10-CM | POA: Diagnosis not present

## 2023-06-03 DIAGNOSIS — R1310 Dysphagia, unspecified: Secondary | ICD-10-CM

## 2023-06-03 DIAGNOSIS — K5900 Constipation, unspecified: Secondary | ICD-10-CM | POA: Diagnosis not present

## 2023-06-03 DIAGNOSIS — E119 Type 2 diabetes mellitus without complications: Secondary | ICD-10-CM | POA: Diagnosis not present

## 2023-06-03 DIAGNOSIS — Z7984 Long term (current) use of oral hypoglycemic drugs: Secondary | ICD-10-CM | POA: Insufficient documentation

## 2023-06-03 DIAGNOSIS — K449 Diaphragmatic hernia without obstruction or gangrene: Secondary | ICD-10-CM

## 2023-06-03 DIAGNOSIS — K219 Gastro-esophageal reflux disease without esophagitis: Secondary | ICD-10-CM | POA: Insufficient documentation

## 2023-06-03 DIAGNOSIS — Z79899 Other long term (current) drug therapy: Secondary | ICD-10-CM | POA: Diagnosis not present

## 2023-06-03 DIAGNOSIS — I10 Essential (primary) hypertension: Secondary | ICD-10-CM | POA: Insufficient documentation

## 2023-06-03 HISTORY — PX: ESOPHAGOGASTRODUODENOSCOPY (EGD) WITH PROPOFOL: SHX5813

## 2023-06-03 HISTORY — PX: SAVORY DILATION: SHX5439

## 2023-06-03 SURGERY — ESOPHAGOGASTRODUODENOSCOPY (EGD) WITH PROPOFOL
Anesthesia: General

## 2023-06-03 MED ORDER — LIDOCAINE HCL (CARDIAC) PF 100 MG/5ML IV SOSY
PREFILLED_SYRINGE | INTRAVENOUS | Status: DC | PRN
  Administered 2023-06-03: 60 mg via INTRAVENOUS

## 2023-06-03 MED ORDER — LACTATED RINGERS IV SOLN: INTRAVENOUS | Status: DC

## 2023-06-03 MED ORDER — PROPOFOL 10 MG/ML IV BOLUS
INTRAVENOUS | Status: DC | PRN
  Administered 2023-06-03: 40 mg via INTRAVENOUS
  Administered 2023-06-03: 160 mg via INTRAVENOUS

## 2023-06-03 NOTE — Op Note (Signed)
[facility] Patient Name: Jessica Kirby Procedure Date: 06/03/2023 12:00 PM MRN: [id number] Date of Birth: October 22, 1955 Attending MD: Katrinka Blazing , , [id number] CSN: [id number] Age: 67 Admit Type: Outpatient Procedure:                Upper GI endoscopy Indications:              Dysphagia, Esophageal reflux Providers:                Katrinka Blazing, Crystal Page, Lennice Sites                            Technician, Technician Referring MD:              Medicines:                Monitored Anesthesia Care Complications:            No immediate complications. Estimated Blood Loss:     Estimated blood loss: none. Procedure:                Pre-Anesthesia Assessment:                           - Prior to the procedure, a History and Physical                            was performed, and patient medications, allergies                            and sensitivities were reviewed. The patient's                            tolerance of previous anesthesia was reviewed.                           - The risks and benefits of the procedure and the                            sedation options and risks were discussed with the                            patient. All questions were answered and informed                            consent was obtained.                           - ASA Grade Assessment: II - A patient with mild                            systemic disease.                           After obtaining informed consent, the endoscope was                            passed under direct vision. Throughout the  procedure, the patient's blood pressure, pulse, and                            oxygen saturations were monitored continuously. The                            [id number]-[id number] ([id number]) scope was introduced through the                            mouth, and advanced to the second part of duodenum.                            The upper GI endoscopy was accomplished without                             difficulty. The patient tolerated the procedure                            well. Scope In: 12:28:39 PM Scope Out: 12:34:49 PM Total Procedure Duration: 0 hours 6 minutes 10 seconds  Findings:      No endoscopic abnormality was evident in the esophagus to explain the       patient's complaint of dysphagia. It was decided, however, to proceed       with dilation of the entire esophagus. A guidewire was placed and the       scope was withdrawn. Dilation was performed with a Savary dilator with       no resistance at 18 mm. The dilation site was examined following       endoscope reinsertion and showed no change.      A 1 cm hiatal hernia was present.      The stomach was normal.      The examined duodenum was normal. Impression:               - No endoscopic esophageal abnormality to explain                            patient's dysphagia. Esophagus dilated. Dilated.                           - 1 cm hiatal hernia.                           - Normal stomach.                           - Normal examined duodenum.                           - No specimens collected. Moderate Sedation:      Per Anesthesia Care Recommendation:           - Discharge patient to home (ambulatory).                           - Resume previous diet.                           -  Await pathology results.                           - Continue with pantoprazole 20 mg twice a day. If                            no improvement, will consider increasing the dose                            to 40 mg twice a day. Procedure Code(s):        --- Professional ---                           [phone], Esophagogastroduodenoscopy, flexible,                            transoral; with insertion of guide wire followed by                            passage of dilator(s) through esophagus over guide                            wire Diagnosis Code(s):        --- Professional ---                           R13.10,  Dysphagia, unspecified                           K44.9, Diaphragmatic hernia without obstruction or                            gangrene                           K21.9, Gastro-esophageal reflux disease without                            esophagitis CPT copyright 2022 American Medical Association. All rights reserved. The codes documented in this report are preliminary and upon coder review may  be revised to meet current compliance requirements. Katrinka Blazing, MD Katrinka Blazing,  06/03/2023 12:43:22 PM This report has been signed electronically. Number of Addenda: 0

## 2023-06-03 NOTE — Anesthesia Preprocedure Evaluation (Addendum)
Anesthesia Evaluation  Patient identified by MRN, date of birth, ID band Patient awake    Reviewed: Allergy & Precautions, H&P , NPO status , Patient's Chart, lab work & pertinent test results, reviewed documented beta blocker date and time   Airway Mallampati: II  TM Distance: >3 FB Neck ROM: Full    Dental  (+) Dental Advisory Given, Chipped   Pulmonary neg pulmonary ROS   Pulmonary exam normal breath sounds clear to auscultation       Cardiovascular hypertension, Pt. on medications and Pt. on home beta blockers + Valvular Problems/Murmurs  Rhythm:Regular Rate:Normal + Systolic murmurs 1. Left ventricular ejection fraction, by estimation, is 70 to 75%. The  left ventricle has hyperdynamic function. The left ventricle has no  regional wall motion abnormalities. There is mild left ventricular  hypertrophy. Left ventricular diastolic  parameters were normal.   2. Right ventricular systolic function is normal. The right ventricular  size is normal.   3. The mitral valve is normal in structure. No evidence of mitral valve  regurgitation.   4. The aortic valve is normal in structure. Aortic valve regurgitation is  not visualized.     Neuro/Psych  PSYCHIATRIC DISORDERS Anxiety Depression    negative neurological ROS     GI/Hepatic Neg liver ROS,GERD  Medicated and Poorly Controlled,,  Endo/Other  diabetes, Well Controlled, Type 2, Oral Hypoglycemic AgentsHypothyroidism    Renal/GU negative Renal ROS  negative genitourinary   Musculoskeletal negative musculoskeletal ROS (+)    Abdominal   Peds negative pediatric ROS (+)  Hematology negative hematology ROS (+)   Anesthesia Other Findings   Reproductive/Obstetrics negative OB ROS                             Anesthesia Physical Anesthesia Plan  ASA: 2  Anesthesia Plan: General   Post-op Pain Management: Minimal or no pain anticipated    Induction: Intravenous  PONV Risk Score and Plan: 0 and Propofol infusion  Airway Management Planned: Nasal Cannula and Natural Airway  Additional Equipment:   Intra-op Plan:   Post-operative Plan:   Informed Consent: I have reviewed the patients History and Physical, chart, labs and discussed the procedure including the risks, benefits and alternatives for the proposed anesthesia with the patient or authorized representative who has indicated his/her understanding and acceptance.     Dental advisory given  Plan Discussed with: CRNA and Surgeon  Anesthesia Plan Comments:        Anesthesia Quick Evaluation

## 2023-06-03 NOTE — Transfer of Care (Signed)
Immediate Anesthesia Transfer of Care Note  Patient: Jessica Kirby  Procedure(s) Performed: ESOPHAGOGASTRODUODENOSCOPY (EGD) WITH PROPOFOL SAVORY DILATION  Patient Location: Endoscopy Unit  Anesthesia Type:General  Level of Consciousness: drowsy  Airway & Oxygen Therapy: Patient Spontanous Breathing  Post-op Assessment: Report given to RN and Post -op Vital signs reviewed and stable  Post vital signs: Reviewed and stable  Last Vitals:  Vitals Value Taken Time  BP 116/67 06/03/23  1243  Temp 97.5 06/03/23  1243  Pulse 76 06/03/23  1243  Resp 13 06/03/23  1243  SpO2 94 06/03/23  1243  06/03/23  1243  Last Pain:  Vitals:   06/03/23 1223  TempSrc:   PainSc: 0-No pain      Patients Stated Pain Goal: 8 (06/03/23 1009)  Complications: No notable events documented.

## 2023-06-03 NOTE — Anesthesia Procedure Notes (Signed)
Date/Time: 06/03/2023 12:22 PM  Performed by: Franco Nones, CRNAPre-anesthesia Checklist: Patient identified, Emergency Drugs available, Suction available, Timeout performed and Patient being monitored Patient Re-evaluated:Patient Re-evaluated prior to induction Oxygen Delivery Method: Nasal Cannula

## 2023-06-03 NOTE — Anesthesia Postprocedure Evaluation (Signed)
Anesthesia Post Note  Patient: Jessica Kirby  Procedure(s) Performed: ESOPHAGOGASTRODUODENOSCOPY (EGD) WITH PROPOFOL SAVORY DILATION  Patient location during evaluation: Phase II Anesthesia Type: General Level of consciousness: awake and alert and oriented Pain management: pain level controlled Vital Signs Assessment: post-procedure vital signs reviewed and stable Respiratory status: spontaneous breathing, nonlabored ventilation and respiratory function stable Cardiovascular status: blood pressure returned to baseline and stable Postop Assessment: no apparent nausea or vomiting Anesthetic complications: no  No notable events documented.   Last Vitals:  Vitals:   06/03/23 1009 06/03/23 1238  BP: (!) 167/82 116/67  Pulse: 83 77  Resp: 16 16  Temp: 36.7 C (!) 36.4 C  SpO2: 98% 92%    Last Pain:  Vitals:   06/03/23 1238  TempSrc: Axillary  PainSc: 0-No pain                 Drusilla Wampole C Broderick Fonseca

## 2023-06-03 NOTE — Discharge Instructions (Addendum)
You are being discharged to home.  Resume your previous diet.  We are waiting for your pathology results.  Continue with pantoprazole 20 mg twice a day. If no improvement, will consider increasing the dose to 40 mg twice a day.

## 2023-06-03 NOTE — Interval H&P Note (Signed)
History and Physical Interval Note:  06/03/2023 12:21 PM  Jessica Kirby  has presented today for surgery, with the diagnosis of dysphagia,GERD.  The various methods of treatment have been discussed with the patient and family. After consideration of risks, benefits and other options for treatment, the patient has consented to  Procedure(s) with comments: ESOPHAGOGASTRODUODENOSCOPY (EGD) WITH PROPOFOL (N/A) - 11:30 am, asa 2 ESOPHAGEAL DILATION (N/A) as a surgical intervention.  The patient's history has been reviewed, patient examined, no change in status, stable for surgery.  I have reviewed the patient's chart and labs.  Questions were answered to the patient's satisfaction.     Katrinka Blazing Mayorga

## 2023-06-09 ENCOUNTER — Ambulatory Visit (HOSPITAL_COMMUNITY): Payer: Medicare Other | Admitting: Clinical

## 2023-06-09 DIAGNOSIS — F321 Major depressive disorder, single episode, moderate: Secondary | ICD-10-CM

## 2023-06-09 DIAGNOSIS — F4321 Adjustment disorder with depressed mood: Secondary | ICD-10-CM

## 2023-06-09 DIAGNOSIS — F4322 Adjustment disorder with anxiety: Secondary | ICD-10-CM

## 2023-06-09 DIAGNOSIS — F4323 Adjustment disorder with mixed anxiety and depressed mood: Secondary | ICD-10-CM | POA: Diagnosis not present

## 2023-06-09 NOTE — Progress Notes (Signed)
IN PERSON   I connected with Jessica Kirby on 06/09/23 at  10:00 AM EDT in person and verified that I am speaking with the correct person using two identifiers.   Location: Patient: Office Provider: Office   I discussed the limitations of evaluation and management by telemedicine and the availability of in person appointments. The patient expressed understanding and agreed to proceed. (IN PERSON)   THERAPIST PROGRESS NOTE   Session Time: 10:00 AM-10:30 AM   Participation Level: Active   Behavioral Response: CasualAlertDepressed   Type of Therapy: Individual Therapy   Treatment Goals addressed: Depression and Greif   Interventions: CBT, Motivational Interviewing, Solution Focused and Strength-based   Summary: Jessica Kirby is a 67 y.o. female who presents with Depression /Adjustments Disorder/ unresolved grief. The OPT therapist worked with the patient for her ongoing OPT treatment. The OPT therapist utilized Motivational Interviewing to assist in creating therapeutic repore. The patient in the session was engaged and work in collaboration giving feedback about her triggers and symptoms over the past few weeks. The patient spoke about getting a new pet and this helping her with companionship and not isolating. The patient spoke about a noticeable change  ongoing with her being at a new place of willingness to move forward and let go of her past. The patient indicated a noticeable difference with the impact of grief continuing and not being as present and this allowing her to make some choices with her independence including redesigning the home. The OPT therapist utilized Cognitive Behavioral Therapy through cognitive restructuring as well as worked with the patient on coping strategies to assist in management of mood and being active. The OPT therapist promoted the patients work to challenge automatic negative thinking and promoted positive mindset.The OPT therapist worked with the  patient providing support and psycho-education. The patient spoke about focusing on herself and getting healthy. The patient has continued working out at the gym on a routine basis.  The OPT therapist worked with the patient on coping strategies including  journaling, using a planner, and getting out of the home more. The patient overviewed her upcoming health appointments as listed in patients MyChart.   Suicidal/Homicidal: Nowithout intent/plan   Therapist Response: The OPT therapist worked with the patient for the patients scheduled session. The patient was engaged in her session and gave feedback in relation to triggers, symptoms, and behavior responses over the past few weeks. The patient spoke about getting a new animal and this helping to keep her busy.The OPT therapist worked with the patient utilizing an in session Cognitive Behavioral Therapy exercise. The patient was responsive in the session and verbalized, " I feel like I am getting better and I am doing the best I can and making decisions and starting to do things just for myself" . The OPT therapist worked with the patient reviewing coping strategies and promoting on going self check-ins..The patient will continue to work on her physical health and attend all scheduled health appointments. The OPT therapist will continue treatment work with the patient in her next scheduled session.   Plan: Return again in 2/3 weeks.   Diagnosis:      Axis I: Major depressive disorder, single episode, moderate / Unresolved grief/ Adjustment Disorder with anxious mood                           Axis II: No diagnosis   Collaboration of Care: Collaboration of care for this  session review of med therapy with Dr. Tenny Craw.   Patient/Guardian was advised Release of Information must be obtained prior to any record release in order to collaborate their care with an outside provider. Patient/Guardian was advised if they have not already done so to contact the  registration department to sign all necessary forms in order for [place] to release information regarding their care.    Consent: Patient/Guardian gives verbal consent for treatment and assignment of benefits for services provided during this visit. Patient/Guardian expressed understanding and agreed to proceed.       I discussed the assessment and treatment plan with the patient. The patient was provided an opportunity to ask questions and all were answered. The patient agreed with the plan and demonstrated an understanding of the instructions.   The patient was advised to call back or seek an in-person evaluation if the symptoms worsen or if the condition fails to improve as anticipated.   I provided 30 minutes of face-to-face time during this encounter.   Suzan Garibaldi, LCSW   06/09/2023

## 2023-06-11 ENCOUNTER — Encounter (HOSPITAL_COMMUNITY): Payer: Self-pay | Admitting: Gastroenterology

## 2023-07-06 IMAGING — MR MR CERVICAL SPINE W/O CM
16 of 17 series · 43 of 48 positions shown · non-contrast
Comparison: MRI head 07/09/2011 common correlation is also made
with CT head 02/03/2022

CLINICAL DATA: Bilateral arm numbness, headache

EXAM:
MRI HEAD WITHOUT CONTRAST
MRI CERVICAL SPINE WITHOUT CONTRAST
TECHNIQUE: Multiplanar, multiecho pulse sequences of the brain and surrounding
structures, and cervical spine, to include the craniocervical
junction and cervicothoracic junction, were obtained without
intravenous contrast.

[Series 5: DWI · axial · 4.0mm · 0.88mm/px · z∈[-62,+83]mm · 4 of 38 slices shown (1 of 6)]
[im 1/38]
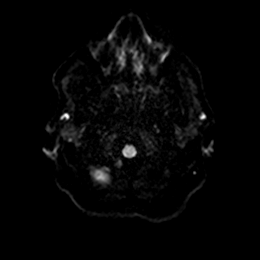
[im 13/38]
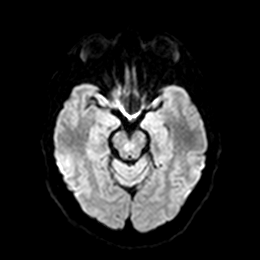
[im 25/38]
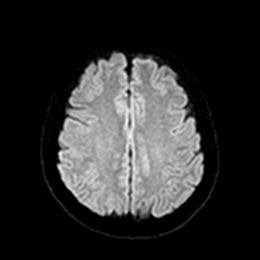
[im 38/38]
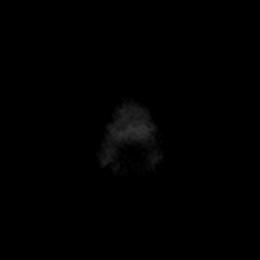

[Series 5: DWI · axial · 4.0mm · 0.88mm/px · z∈[-62,+83]mm · 4 of 38 slices shown (2 of 6)]
[im 1/38]
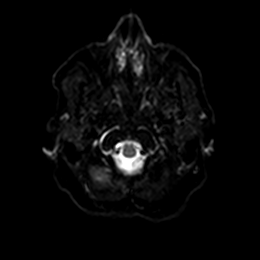
[im 13/38]
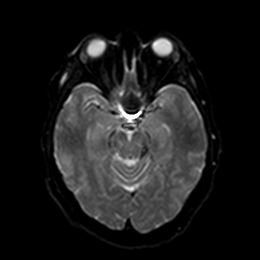
[im 25/38]
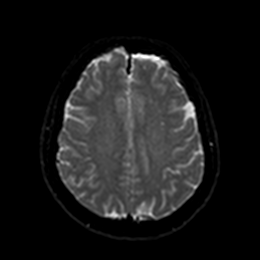
[im 38/38]
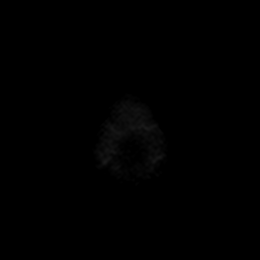

[Series 6: DWI · axial · 4.0mm · 0.88mm/px · z∈[-62,+83]mm · 4 of 38 slices shown (3 of 6)]
[im 1/38]
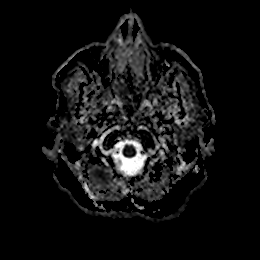
[im 13/38]
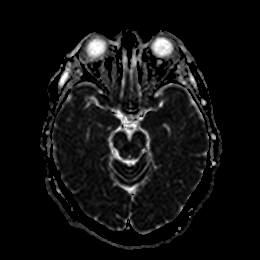
[im 25/38]
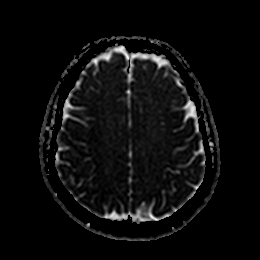
[im 38/38]
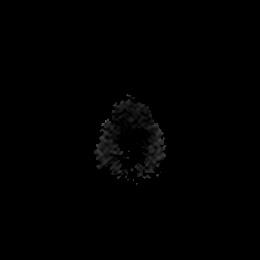

[Series 7: DWI · coronal · 5.0mm · 0.88mm/px · 3 of 29 slices shown (4 of 6)]
[im 1/29]
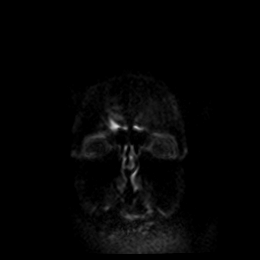
[im 15/29]
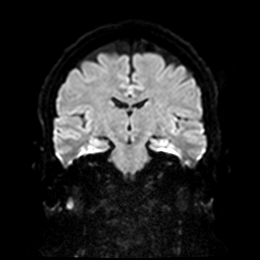
[im 29/29]
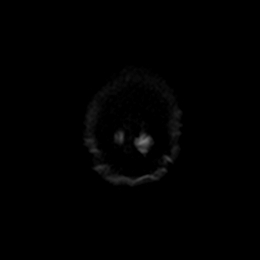

[Series 7: DWI · coronal · 5.0mm · 0.88mm/px · 3 of 29 slices shown (5 of 6)]
[im 1/29]
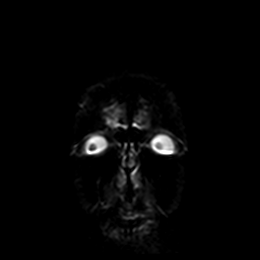
[im 15/29]
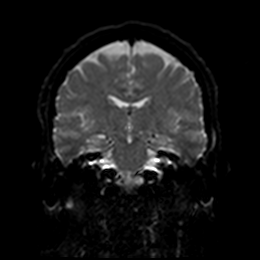
[im 29/29]
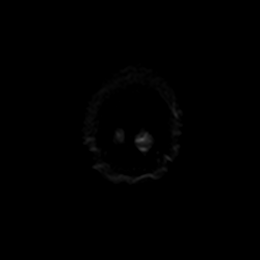

[Series 8: DWI · coronal · 5.0mm · 0.88mm/px · 3 of 29 slices shown (6 of 6)]
[im 1/29]
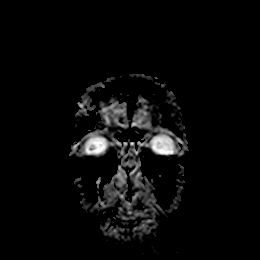
[im 15/29]
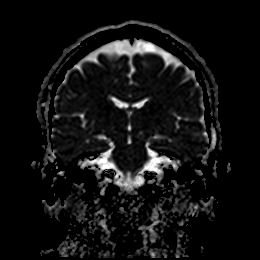
[im 29/29]
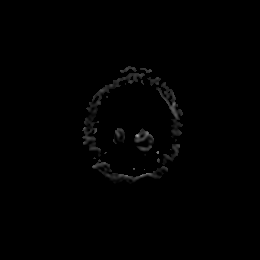

[Series 9: T1 · sagittal · 5.0mm · 0.94mm/px · 2 of 21 slices shown]
[im 1/21]
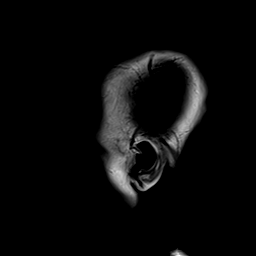
[im 21/21]
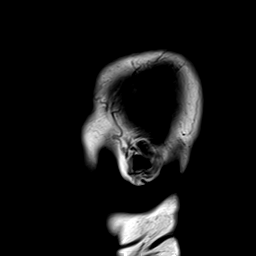

[Series 10: T2 · axial · 5.0mm · 0.72mm/px · z∈[-65,+86]mm · 2 of 23 slices shown (1 of 2)]
[im 1/23]
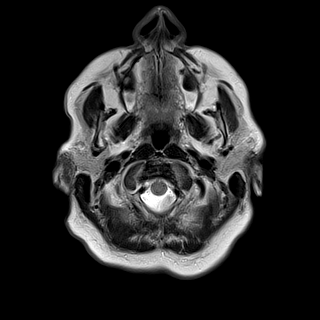
[im 23/23]
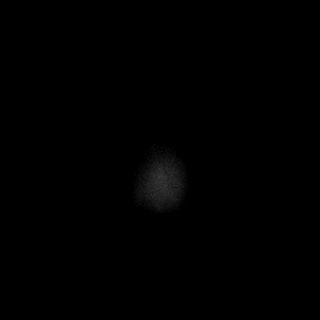

[Series 11: ax hemo · axial · 5.0mm · 0.86mm/px · z∈[-59,+81]mm · 2 of 25 slices shown]
[im 1/25]
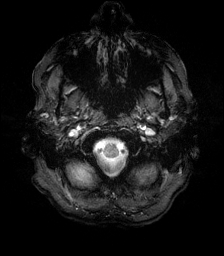
[im 25/25]
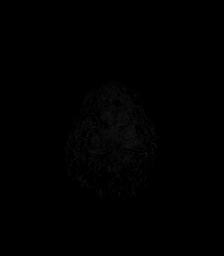

[Series 12: FLAIR · axial · 4.0mm · 0.43mm/px · z∈[-59,+81]mm · 4 of 37 slices shown]
[im 1/37]
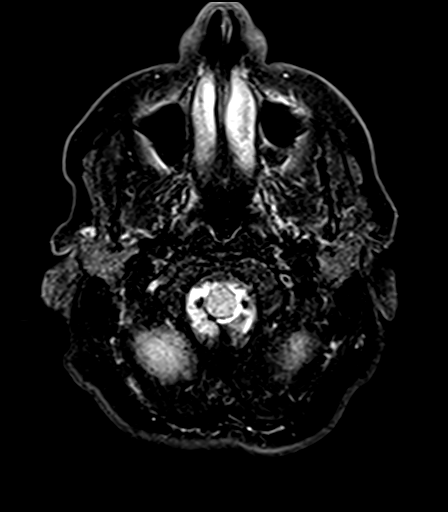
[im 13/37]
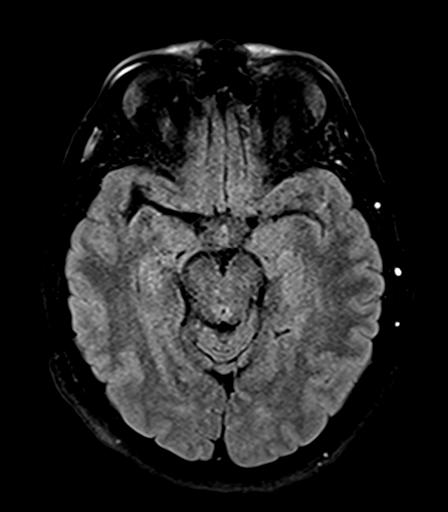
[im 25/37]
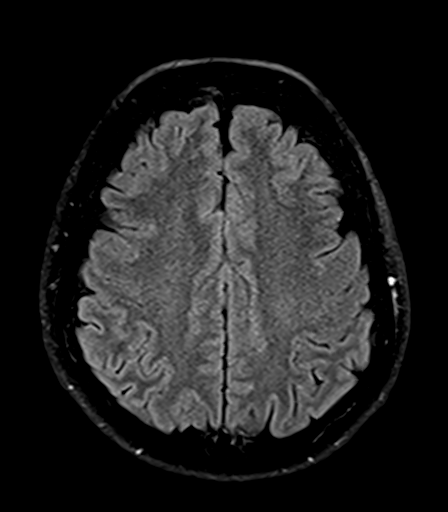
[im 37/37]
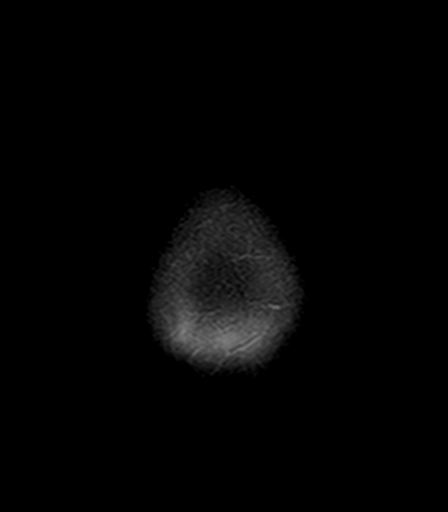

[Series 14: T2 · coronal · 5.0mm · 0.72mm/px · 3 of 28 slices shown (2 of 2)]
[im 1/28]
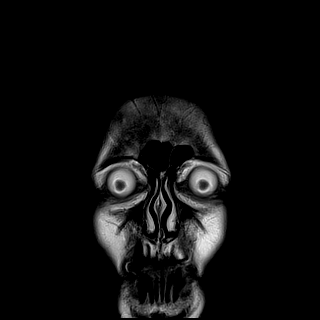
[im 14/28]
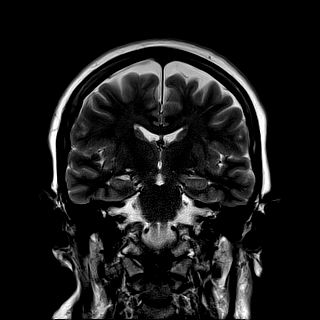
[im 28/28]
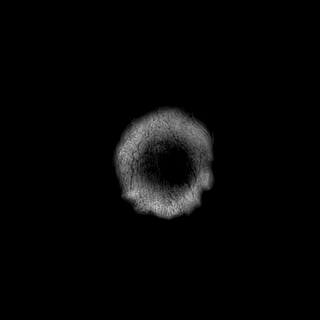

[Series 19: t2_tse_sag_fast · sagittal · 3.0mm · 0.43mm/px · 1 of 15 slices shown]
[im 1/15]
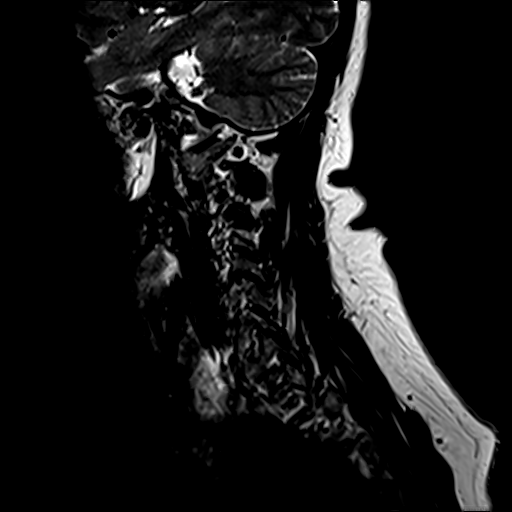

[Series 20: t1_tse_sag_fast · sagittal · 3.0mm · 0.43mm/px · 1 of 15 slices shown]
[im 1/15]
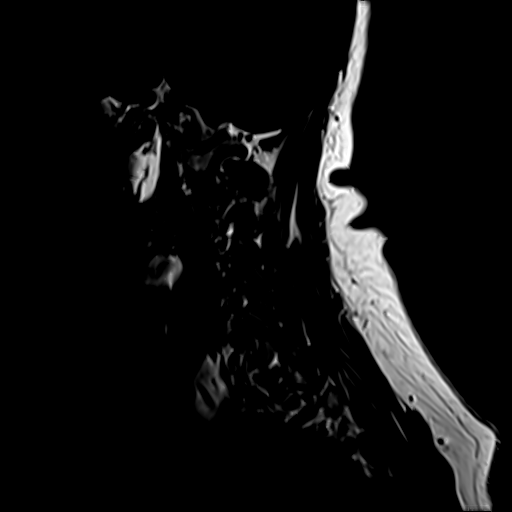

[Series 21: STIR · sagittal · 3.0mm · 0.86mm/px · 1 of 15 slices shown]
[im 1/15]
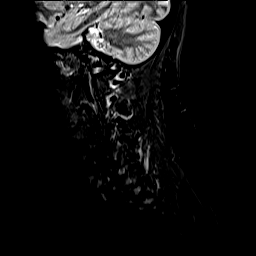

[Series 22: t2_tse_tra_fast · axial · 3.0mm · 0.78mm/px · z∈[-188,-101]mm · 3 of 29 slices shown]
[im 1/29]
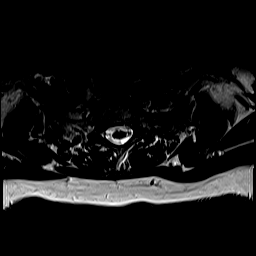
[im 15/29]
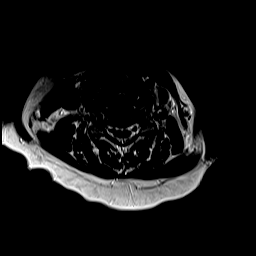
[im 29/29]
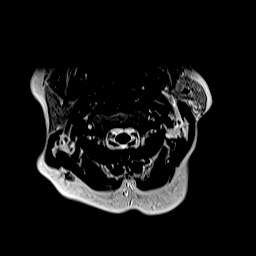

[Series 23: GRE · axial · 3.0mm · 0.78mm/px · z∈[-188,-101]mm · 3 of 29 slices shown]
[im 1/29]
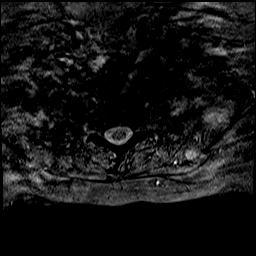
[im 15/29]
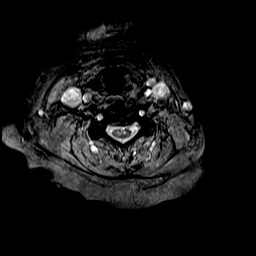
[im 29/29]
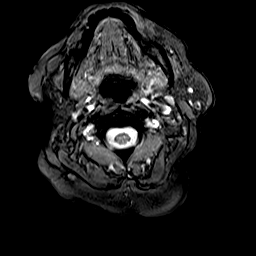

[43 of 48 positions shown; findings below may reference images not displayed]

FINDINGS: Evaluation is somewhat limited by motion artifact.

MRI HEAD FINDINGS

Brain: No restricted diffusion to suggest acute or subacute infarct.
No acute hemorrhage, mass, mass effect, or midline shift. No
hydrocephalus or extra-axial collection. No hemosiderin deposition
to suggest remote hemorrhage. Scattered T2 hyperintense signal in
the periventricular white matter, likely the sequela of mild chronic
small vessel ischemic disease.

Vascular: Normal flow voids.

Skull and upper cervical spine: Normal marrow signal.

Sinuses/Orbits: Negative.

Other: The mastoids are well aerated.

MRI CERVICAL SPINE FINDINGS

Alignment: Straightening of the normal cervical lordosis. No
significant listhesis.

Vertebrae: No acute fracture or suspicious osseous lesion.

Cord: Normal signal and morphology.

Posterior Fossa, vertebral arteries, paraspinal tissues: Negative.

Disc levels:

C2-C3: No significant disc bulge. No spinal canal stenosis or
neuroforaminal narrowing.

C3-C4: Minimal disc bulge. Uncovertebral and facet arthropathy. No
spinal canal stenosis. Mild bilateral neural foraminal narrowing.

C4-C5: Minimal disc bulge. Facet and uncovertebral hypertrophy. No
spinal canal stenosis. Moderate right and mild left neural foraminal
narrowing.

C5-C6: Minimal disc bulge. Uncovertebral and facet arthropathy. No
spinal canal stenosis. Mild right neural foraminal narrowing.

C6-C7: No significant disc bulge. Uncovertebral and facet
arthropathy. No spinal canal stenosis. Mild left neural foraminal
narrowing.

C7-T1: Minimal disc bulge. Uncovertebral and facet arthropathy. No
spinal canal stenosis. Mild bilateral neural foraminal narrowing.
IMPRESSION: 1. Evaluation is somewhat limited by motion. Within this limitation,
there is no spinal canal stenosis or cord signal abnormality.
2. Multilevel uncovertebral and facet arthropathy, which causes up
to moderate neural foraminal narrowing, which is noted on the right
at C4-C5.
3. Additional mild neural foraminal narrowing is noted bilaterally
at C3-C4 and C7-T1, on the right at C5-C6, and on the left at C4-C5
and C6-C7.

## 2023-07-06 IMAGING — DX DG CHEST 2V
2 series · 2 of 2 positions shown · non-contrast
Comparison: 03/15/2020

CLINICAL DATA: Weakness

EXAM:
CHEST - 2 VIEW

[chest pa]
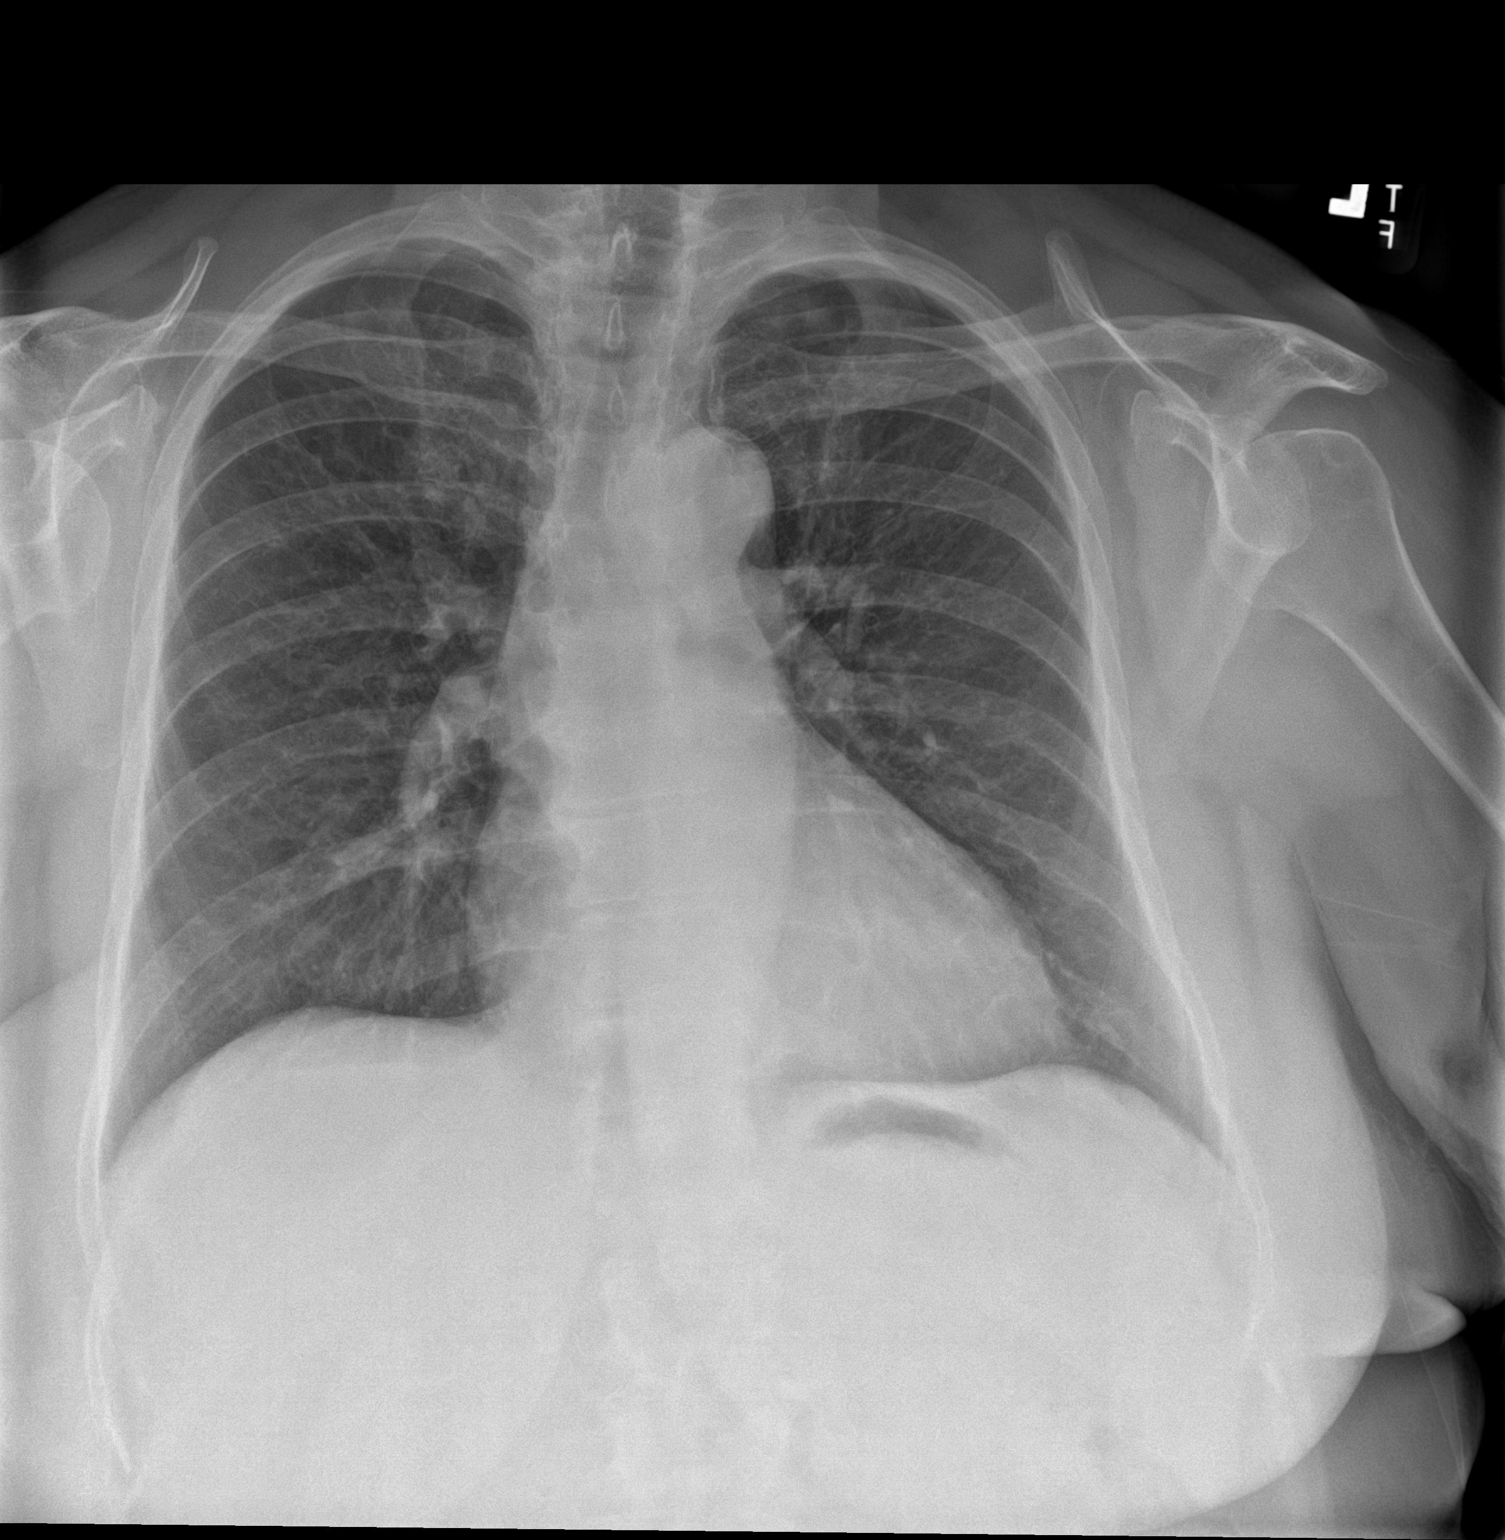

[chest lat]
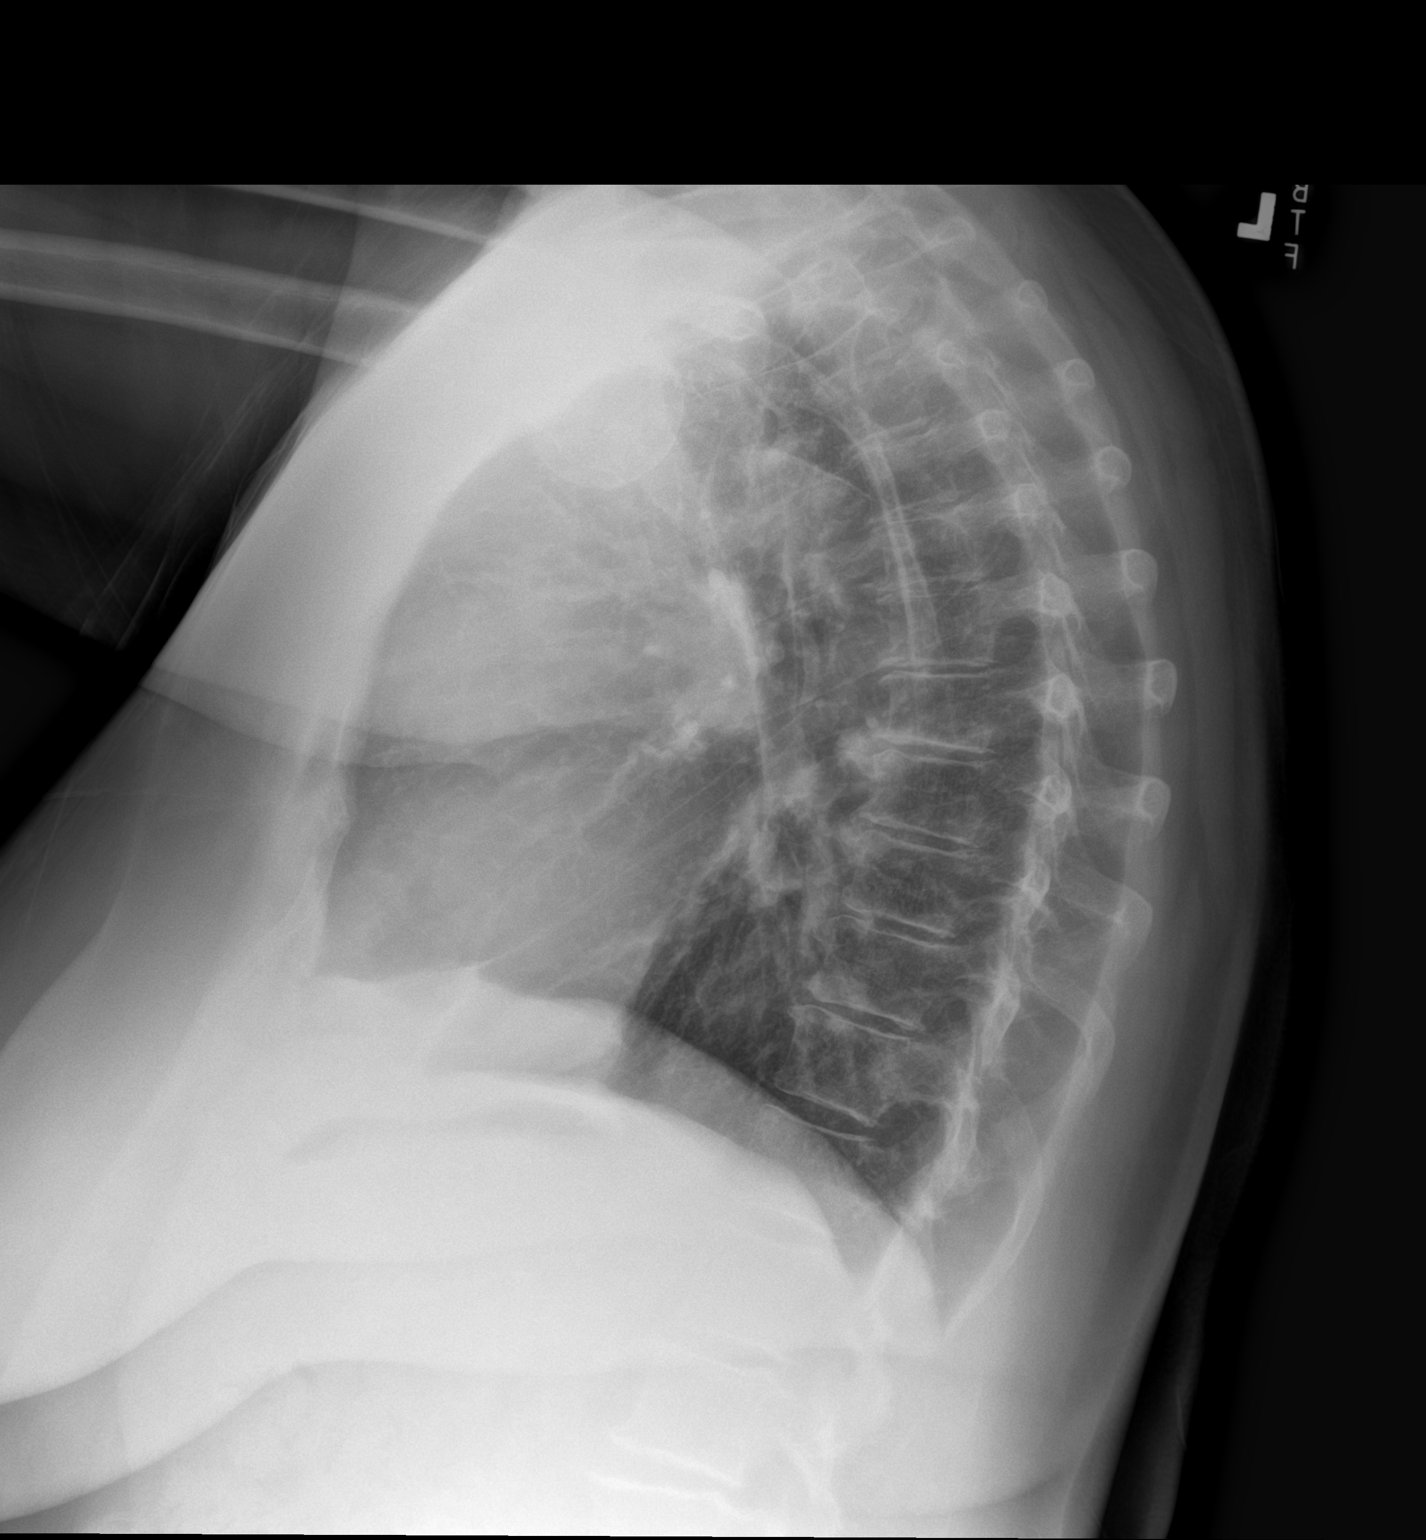

[2 of 2 positions shown; findings below may reference images not displayed]

FINDINGS: The heart size and mediastinal contours are within normal limits.
Both lungs are clear. The visualized skeletal structures are
unremarkable.
IMPRESSION: No active cardiopulmonary disease.

## 2023-07-08 ENCOUNTER — Ambulatory Visit: Payer: Medicare Other | Admitting: Nurse Practitioner

## 2023-07-08 ENCOUNTER — Encounter: Payer: Self-pay | Admitting: Nurse Practitioner

## 2023-07-08 VITALS — BP 150/82 | HR 80 | Temp 100.4°F | Ht 63.0 in | Wt 207.2 lb

## 2023-07-08 DIAGNOSIS — N3001 Acute cystitis with hematuria: Secondary | ICD-10-CM

## 2023-07-08 DIAGNOSIS — E1140 Type 2 diabetes mellitus with diabetic neuropathy, unspecified: Secondary | ICD-10-CM | POA: Diagnosis not present

## 2023-07-08 DIAGNOSIS — R300 Dysuria: Secondary | ICD-10-CM | POA: Diagnosis not present

## 2023-07-08 DIAGNOSIS — M79675 Pain in left toe(s): Secondary | ICD-10-CM | POA: Diagnosis not present

## 2023-07-08 DIAGNOSIS — L110 Acquired keratosis follicularis: Secondary | ICD-10-CM | POA: Diagnosis not present

## 2023-07-08 DIAGNOSIS — M79674 Pain in right toe(s): Secondary | ICD-10-CM | POA: Diagnosis not present

## 2023-07-08 DIAGNOSIS — M79672 Pain in left foot: Secondary | ICD-10-CM | POA: Diagnosis not present

## 2023-07-08 DIAGNOSIS — M79671 Pain in right foot: Secondary | ICD-10-CM | POA: Diagnosis not present

## 2023-07-08 LAB — POCT URINALYSIS DIP (CLINITEK)
Bilirubin, UA: NEGATIVE
Glucose, UA: NEGATIVE mg/dL
Ketones, POC UA: NEGATIVE mg/dL
Nitrite, UA: NEGATIVE
Spec Grav, UA: <=1.005 — AB (ref 1.010–1.025)
Urobilinogen, UA: 0.2 U/dL
pH, UA: 8 (ref 5.0–8.0)

## 2023-07-08 MED ORDER — CEFDINIR 300 MG PO CAPS
300.0000 mg | ORAL_CAPSULE | Freq: Two times a day (BID) | ORAL | 0 refills | Status: DC
Start: 2023-07-08 — End: 2023-07-20

## 2023-07-08 NOTE — Progress Notes (Signed)
Subjective:    Patient ID: Jessica Kirby, female    DOB: 10-17-1955, 67 y.o.   MRN: [id number]  HPI Presents for complaints of urinary symptoms that began this morning.  Saw blood in her urine that looked stringy with what appeared to be very tiny clots.  Feels like she always has to go urinate.  Urgency.  Frequency.  Pressure in the pelvic area.  Burning with urination.  Left low back pain.  No fever.  No history of recent UTIs.  No new sexual partners.  No vaginal discharge.  Taking fluids well.  No nausea or vomiting.  Has a history of OAB.  Was placed on Myrbetriq at 1 point but did not see any improvement, not currently on any therapy.  Non-smoker.  Mild head congestion.  No sinus headache.  No cough.   Review of Systems  Constitutional:  Negative for fever.  Respiratory:  Negative for cough, chest tightness and shortness of breath.   Cardiovascular:  Negative for chest pain.  Genitourinary:  Positive for dysuria, frequency, hematuria, pelvic pain and urgency. Negative for vaginal discharge.       Objective:   Physical Exam NAD.  Alert, oriented.  TMs retracted bilaterally, no erythema.  Nares pale and boggy.  Pharynx clear, membranes moist.  Neck supple with mild soft anterior adenopathy.  Lungs clear.  Heart regular rate rhythm.  No CVA tenderness to percussion.  Lower abdomen soft nondistended with sensation of mild suprapubic pressure noted with palpation. Today's Vitals   07/08/23 0925  BP: (!) 150/82  Pulse: 80  Temp: (!) 100.4 F (38 C)  SpO2: 95%  Weight: 207 lb 3.2 oz (94 kg)  Height: 5\' 3"  (1.6 m)   Body mass index is 36.7 kg/m. Results for orders placed or performed in visit on 07/08/23  POCT URINALYSIS DIP (CLINITEK)  Result Value Ref Range   Color, UA light yellow (A) yellow   Clarity, UA clear clear   Glucose, UA negative negative mg/dL   Bilirubin, UA negative negative   Ketones, POC UA negative negative mg/dL   Spec Grav, UA <=5.621 (A) 1.010 - 1.025    Blood, UA large (A) negative   pH, UA 8.0 5.0 - 8.0   POC PROTEIN,UA trace negative, trace   Urobilinogen, UA 0.2 0.2 or 1.0 E.U./dL   Nitrite, UA Negative Negative   Leukocytes, UA Moderate (2+) (A) Negative           Assessment & Plan:   Problem List Items Addressed This Visit       Genitourinary   Acute cystitis with hematuria - Primary   Relevant Orders   Urine Culture   Other Visit Diagnoses     Dysuria       Relevant Orders   POCT URINALYSIS DIP (CLINITEK) (Completed)   Urine Culture      Meds ordered this encounter  Medications   cefdinir (OMNICEF) 300 MG capsule    Sig: Take 1 capsule (300 mg total) by mouth 2 (two) times daily.    Dispense:  14 capsule    Refill:  0    Order Specific Question:   Supervising Provider    Answer:   Lilyan Punt A [id number]   Urine culture pending. Due to fever and current symptoms, start cefdinir as directed.  May take AZO as directed for the next 48 hours then discontinue.  Continue clear fluid intake. Warning signs reviewed. Call back early next week if no improvement, go to  ED or urgent care sooner if worse. Recommend recheck urine for hematuria at her visit with Dr. Adriana Simas on 10/22.  Recommend preventive health physical and visit to discuss OAB.

## 2023-07-10 LAB — URINE CULTURE

## 2023-07-10 LAB — SPECIMEN STATUS REPORT

## 2023-07-15 ENCOUNTER — Ambulatory Visit (HOSPITAL_COMMUNITY): Payer: 59 | Admitting: Clinical

## 2023-07-15 DIAGNOSIS — F321 Major depressive disorder, single episode, moderate: Secondary | ICD-10-CM

## 2023-07-15 DIAGNOSIS — F4321 Adjustment disorder with depressed mood: Secondary | ICD-10-CM | POA: Diagnosis not present

## 2023-07-15 DIAGNOSIS — F4322 Adjustment disorder with anxiety: Secondary | ICD-10-CM | POA: Diagnosis not present

## 2023-07-15 NOTE — Progress Notes (Signed)
IN PERSON   I connected with Jessica Kirby on 07/15/23 at  8:00 AM EDT in person and verified that I am speaking with the correct person using two identifiers.   Location: Patient: Office Provider: Office   I discussed the limitations of evaluation and management by telemedicine and the availability of in person appointments. The patient expressed understanding and agreed to proceed. (IN PERSON)   THERAPIST PROGRESS NOTE   Session Time: 8:00 AM 8:30 AM   Participation Level: Active   Behavioral Response: CasualAlertDepressed   Type of Therapy: Individual Therapy   Treatment Goals addressed: Depression and Greif   Interventions: CBT, Motivational Interviewing, Solution Focused and Strength-based   Summary: Jessica Kirby is a 67 y.o. female who presents with Depression /Adjustments Disorder/ unresolved grief. The OPT therapist worked with the patient for her ongoing OPT treatment. The OPT therapist utilized Motivational Interviewing to assist in creating therapeutic repore. The patient in the session was engaged and work in collaboration giving feedback about her triggers and symptoms over the past few weeks. The patient spoke about getting out into the community and being involved with more community events and not isolating. The patient spoke about a noticeable change  ongoing with her being at a new place of willingness to move forward and let go of her past. The patient indicated a noticeable difference with the impact of grief continuing and not being as present and this allowing her to make some choices with her independence including redesigning the home. The OPT therapist utilized Cognitive Behavioral Therapy through cognitive restructuring as well as worked with the patient on coping strategies to assist in management of mood and being active. The OPT therapist promoted the patients work to challenge automatic negative thinking and promoted positive mindset.The OPT therapist  worked with the patient providing support and psycho-education. The patient spoke about focusing on herself and getting healthy. The patient has continued working out at the gym on a routine basis.  The OPT therapist worked with the patient on coping strategies including  journaling, using a planner, and getting out of the home more. The patient spoke about upcoming community and churh events she will be involved in including [employer] through her church for the upcoming Halloween holiday. The patient overviewed her upcoming health appointments as listed in patients MyChart.   Suicidal/Homicidal: Nowithout intent/plan   Therapist Response: The OPT therapist worked with the patient for the patients scheduled session. The patient was engaged in her session and gave feedback in relation to triggers, symptoms, and behavior responses over the past few weeks. The patient spoke about getting out of the home more and being more involved in community events and this keeping her busy.The OPT therapist worked with the patient utilizing an in session Cognitive Behavioral Therapy exercise. The patient was responsive in the session and verbalized, " I feel like I am ready to move forward and focus more now on myself and my future" . The OPT therapist worked with the patient reviewing coping strategies and promoting on going self check-ins..The OPT therapist continued to work with the patient on setting family boundaries, socialization, involvement in community and church activities, and maintaining in her areas of basic needs including sleep cycle, eating habits, hygiene, and physical exercise.The patient will continue to work on her physical health and attend all scheduled health appointments. The OPT therapist will continue treatment work with the patient in her next scheduled session.   Plan: Return again in 2/3 weeks.  Diagnosis:      Axis I: Major depressive disorder, single episode, moderate / Unresolved  grief/ Adjustment Disorder with anxious mood                           Axis II: No diagnosis   Collaboration of Care: Collaboration of care for this session review of med therapy with Dr. Tenny Craw.   Patient/Guardian was advised Release of Information must be obtained prior to any record release in order to collaborate their care with an outside provider. Patient/Guardian was advised if they have not already done so to contact the registration department to sign all necessary forms in order for [place] to release information regarding their care.    Consent: Patient/Guardian gives verbal consent for treatment and assignment of benefits for services provided during this visit. Patient/Guardian expressed understanding and agreed to proceed.       I discussed the assessment and treatment plan with the patient. The patient was provided an opportunity to ask questions and all were answered. The patient agreed with the plan and demonstrated an understanding of the instructions.   The patient was advised to call back or seek an in-person evaluation if the symptoms worsen or if the condition fails to improve as anticipated.   I provided 30 minutes of face-to-face time during this encounter.   Suzan Garibaldi, LCSW   07/15/2023

## 2023-07-20 ENCOUNTER — Ambulatory Visit: Payer: Medicare Other | Admitting: Family Medicine

## 2023-07-20 VITALS — BP 150/90 | HR 66 | Temp 97.5°F | Ht 63.0 in | Wt 209.8 lb

## 2023-07-20 DIAGNOSIS — I10 Essential (primary) hypertension: Secondary | ICD-10-CM | POA: Diagnosis not present

## 2023-07-20 DIAGNOSIS — E785 Hyperlipidemia, unspecified: Secondary | ICD-10-CM | POA: Diagnosis not present

## 2023-07-20 DIAGNOSIS — R300 Dysuria: Secondary | ICD-10-CM

## 2023-07-20 DIAGNOSIS — D72829 Elevated white blood cell count, unspecified: Secondary | ICD-10-CM

## 2023-07-20 DIAGNOSIS — E119 Type 2 diabetes mellitus without complications: Secondary | ICD-10-CM

## 2023-07-20 DIAGNOSIS — Z23 Encounter for immunization: Secondary | ICD-10-CM | POA: Diagnosis not present

## 2023-07-20 DIAGNOSIS — K219 Gastro-esophageal reflux disease without esophagitis: Secondary | ICD-10-CM

## 2023-07-20 LAB — POCT URINALYSIS DIP (CLINITEK)
Bilirubin, UA: NEGATIVE
Blood, UA: NEGATIVE
Glucose, UA: NEGATIVE mg/dL
Ketones, POC UA: NEGATIVE mg/dL
Leukocytes, UA: NEGATIVE
Nitrite, UA: NEGATIVE
POC PROTEIN,UA: NEGATIVE
Spec Grav, UA: <=1.005 — AB (ref 1.010–1.025)
Urobilinogen, UA: 0.2 U/dL
pH, UA: 6.5 (ref 5.0–8.0)

## 2023-07-20 MED ORDER — PANTOPRAZOLE SODIUM 20 MG PO TBEC: 20.0000 mg | DELAYED_RELEASE_TABLET | Freq: Every day | ORAL | 3 refills | Status: DC

## 2023-07-20 NOTE — Progress Notes (Signed)
Subjective:  Patient ID: Jessica Kirby, female    DOB: 12-19-1955  Age: 67 y.o. MRN: [id number]  CC:  Follow up   HPI:  67 year old female presents for follow-up.  BP mildly elevated here today.  She is compliant with amlodipine and metoprolol.  Has not tolerated ARB or HCTZ due to side effects.  Patient needs A1c.  Last A1c 7.  She is on no medications regarding her diabetes.  Needs foot exam today.  Patient with recent urinary symptoms.  Needs recheck of urine today.  Patient would like her flu vaccine today.  Patient is also overdue for pneumococcal vaccination.  She states that she is planning on getting this at her follow-up appointment in November.  Patient Active Problem List   Diagnosis Date Noted   Proteinuria 12/09/2022   Chronic idiopathic constipation 10/15/2022   Hyperlipidemia 03/25/2022   Diabetes mellitus without complication (HCC) 11/28/2020   Anxiety 11/04/2015   GERD (gastroesophageal reflux disease) 09/27/2013   Hypertension    Osteopenia     Social Hx   Social History   Socioeconomic History   Marital status: Widowed    Spouse name: Not on file   Number of children: Not on file   Years of education: Not on file   Highest education level: Not on file  Occupational History   Not on file  Tobacco Use   Smoking status: Never    Passive exposure: Yes   Smokeless tobacco: Never  Vaping Use   Vaping status: Never Used  Substance and Sexual Activity   Alcohol use: No    Alcohol/week: 0.0 standard drinks of alcohol   Drug use: No   Sexual activity: Yes  Other Topics Concern   Not on file  Social History Narrative   Not on file   Social Determinants of Health   Financial Resource Strain: Low Risk  (05/14/2023)   Overall Financial Resource Strain (CARDIA)    Difficulty of Paying Living Expenses: Not hard at all  Food Insecurity: No Food Insecurity (05/14/2023)   Hunger Vital Sign    Worried About Running Out of Food in the Last Year: Never  true    Ran Out of Food in the Last Year: Never true  Transportation Needs: No Transportation Needs (05/14/2023)   PRAPARE - Administrator, Civil Service (Medical): No    Lack of Transportation (Non-Medical): No  Physical Activity: Sufficiently Active (05/14/2023)   Exercise Vital Sign    Days of Exercise per Week: 7 days    Minutes of Exercise per Session: 30 min  Stress: No Stress Concern Present (05/14/2023)   [employer] of Occupational Health - Occupational Stress Questionnaire    Feeling of Stress : Not at all  Social Connections: Moderately Integrated (05/14/2023)   Social Connection and Isolation Panel [NHANES]    Frequency of Communication with Friends and Family: More than three times a week    Frequency of Social Gatherings with Friends and Family: More than three times a week    Attends Religious Services: More than 4 times per year    Active Member of [employer] or Organizations: Yes    Attends Banker Meetings: More than 4 times per year    Marital Status: Widowed    Review of Systems Per HPI  Objective:  BP (!) 150/90   Pulse 66   Temp (!) 97.5 F (36.4 C)   Ht 5\' 3"  (1.6 m)   Wt 209 lb 12.8 oz (  95.2 kg)   SpO2 96%   BMI 37.16 kg/m      07/20/2023   10:21 AM 07/20/2023    9:36 AM 07/08/2023    9:25 AM  BP/Weight  Systolic BP 150 157 150  Diastolic BP 90 86 82  Wt. (Lbs)  209.8 207.2  BMI  37.16 kg/m2 36.7 kg/m2    Physical Exam Vitals and nursing note reviewed.  Constitutional:      General: She is not in acute distress.    Appearance: Normal appearance.  HENT:     Head: Normocephalic and atraumatic.  Eyes:     General:        Right eye: No discharge.        Left eye: No discharge.     Conjunctiva/sclera: Conjunctivae normal.  Cardiovascular:     Rate and Rhythm: Normal rate and regular rhythm.  Pulmonary:     Effort: Pulmonary effort is normal.     Breath sounds: Normal breath sounds. No wheezing, rhonchi or rales.   Neurological:     Mental Status: She is alert.  Psychiatric:        Mood and Affect: Mood normal.        Behavior: Behavior normal.     Lab Results  Component Value Date   WBC 10.7 (H) 02/21/2023   HGB 12.7 02/21/2023   HCT 37.6 02/21/2023   PLT 264 02/21/2023   GLUCOSE 108 (H) 05/20/2023   CHOL 142 11/24/2022   TRIG 132 11/24/2022   HDL 53 11/24/2022   LDLCALC 66 11/24/2022   ALT 19 03/03/2023   AST 17 03/03/2023   NA 143 05/20/2023   K 4.5 05/20/2023   CL 106 05/20/2023   CREATININE 0.71 05/20/2023   BUN 9 05/20/2023   CO2 25 05/20/2023   TSH 2.820 06/10/2022   INR 0.9 02/03/2022   HGBA1C 7.0 (H) 03/03/2023     Assessment & Plan:   Problem List Items Addressed This Visit       Cardiovascular and Mediastinum   Hypertension - Primary    BP here today.  Advised to check her blood pressures regularly at home.  Continue metoprolol and amlodipine.        Digestive   GERD (gastroesophageal reflux disease)   Relevant Medications   pantoprazole (PROTONIX) 20 MG tablet     Endocrine   Diabetes mellitus without complication (HCC)    A1c today for evaluation.      Relevant Orders   CMP14+EGFR   Hemoglobin A1c   Microalbumin / creatinine urine ratio     Other   Hyperlipidemia    Last LDL was less than 70.  She is compliant with Crestor.  Rechecking today.      Relevant Orders   Lipid panel   Other Visit Diagnoses     Dysuria       Relevant Orders   POCT URINALYSIS DIP (CLINITEK) (Completed)   Leukocytosis, unspecified type       Relevant Orders   CBC   Immunization due       Relevant Orders   Flu Vaccine Trivalent High Dose (Fluad) (Completed)       Meds ordered this encounter  Medications   pantoprazole (PROTONIX) 20 MG tablet    Sig: Take 1 tablet (20 mg total) by mouth daily.    Dispense:  90 tablet    Refill:  3    Follow-up:   6 months  Kimra Kantor Adriana Simas DO [facility]

## 2023-07-20 NOTE — Assessment & Plan Note (Signed)
BP here today.  Advised to check her blood pressures regularly at home.  Continue metoprolol and amlodipine.

## 2023-07-20 NOTE — Assessment & Plan Note (Signed)
A1c today for evaluation.

## 2023-07-20 NOTE — Assessment & Plan Note (Signed)
Last LDL was less than 70.  She is compliant with Crestor.  Rechecking today.

## 2023-07-20 NOTE — Patient Instructions (Signed)
Labs today.  Continue your medications.  Follow up in 6 months.

## 2023-07-21 LAB — CMP14+EGFR
ALT: 18 [IU]/L (ref 0–32)
AST: 19 [IU]/L (ref 0–40)
Albumin: 4.4 g/dL (ref 3.9–4.9)
Alkaline Phosphatase: 87 [IU]/L (ref 44–121)
BUN/Creatinine Ratio: 11 — ABNORMAL LOW (ref 12–28)
BUN: 8 mg/dL (ref 8–27)
Bilirubin Total: 0.8 mg/dL (ref 0.0–1.2)
CO2: 24 mmol/L (ref 20–29)
Calcium: 9.3 mg/dL (ref 8.7–10.3)
Chloride: 104 mmol/L (ref 96–106)
Creatinine, Ser: 0.74 mg/dL (ref 0.57–1.00)
Globulin, Total: 2.7 g/dL (ref 1.5–4.5)
Glucose: 86 mg/dL (ref 70–99)
Potassium: 4.3 mmol/L (ref 3.5–5.2)
Sodium: 144 mmol/L (ref 134–144)
Total Protein: 7.1 g/dL (ref 6.0–8.5)
eGFR: 88 mL/min/{1.73_m2} (ref >59)

## 2023-07-21 LAB — CBC
Hematocrit: 43.2 % (ref 34.0–46.6)
Hemoglobin: 14.2 g/dL (ref 11.1–15.9)
MCH: 29 pg (ref 26.6–33.0)
MCHC: 32.9 g/dL (ref 31.5–35.7)
MCV: 88 fL (ref 79–97)
Platelets: 284 10*3/uL (ref 150–450)
RBC: 4.9 x10E6/uL (ref 3.77–5.28)
RDW: 12 % (ref 11.7–15.4)
WBC: 10.6 10*3/uL (ref 3.4–10.8)

## 2023-07-21 LAB — MICROALBUMIN / CREATININE URINE RATIO
Creatinine, Urine: 18.9 mg/dL
Microalb/Creat Ratio: <16 mg/g{creat} (ref 0–29)
Microalbumin, Urine: <3 ug/mL

## 2023-07-21 LAB — LIPID PANEL
Chol/HDL Ratio: 3.3 ratio (ref 0.0–4.4)
Cholesterol, Total: 158 mg/dL (ref 100–199)
HDL: 48 mg/dL (ref >39)
LDL Chol Calc (NIH): 84 mg/dL (ref 0–99)
Triglycerides: 148 mg/dL (ref 0–149)
VLDL Cholesterol Cal: 26 mg/dL (ref 5–40)

## 2023-07-21 LAB — HEMOGLOBIN A1C
Est. average glucose Bld gHb Est-mCnc: 131 mg/dL
Hgb A1c MFr Bld: 6.2 % — ABNORMAL HIGH (ref 4.8–5.6)

## 2023-07-27 ENCOUNTER — Other Ambulatory Visit: Payer: Self-pay | Admitting: Family Medicine

## 2023-07-27 DIAGNOSIS — E119 Type 2 diabetes mellitus without complications: Secondary | ICD-10-CM

## 2023-07-28 ENCOUNTER — Telehealth: Payer: Self-pay | Admitting: Family Medicine

## 2023-07-28 NOTE — Telephone Encounter (Signed)
Jessica Kirby called back said she read wrong and to cancel the message

## 2023-07-28 NOTE — Telephone Encounter (Signed)
Patient has some question about her amLODipine (NORVASC) 5 MG tablet . She thought you increased to 10 mg instead of 5 mg please advise which dosage is correct.Her refills has been for 5 mg since June

## 2023-08-03 ENCOUNTER — Other Ambulatory Visit (HOSPITAL_COMMUNITY): Payer: Self-pay | Admitting: Family Medicine

## 2023-08-03 DIAGNOSIS — Z1231 Encounter for screening mammogram for malignant neoplasm of breast: Secondary | ICD-10-CM

## 2023-08-11 ENCOUNTER — Ambulatory Visit (HOSPITAL_COMMUNITY)
Admission: RE | Admit: 2023-08-11 | Discharge: 2023-08-11 | Disposition: A | Discharge status: Home / Self Care | Payer: Medicare Other | Source: Ambulatory Visit | Attending: Family Medicine | Admitting: Family Medicine

## 2023-08-11 ENCOUNTER — Ambulatory Visit: Payer: Medicare Other | Admitting: Nurse Practitioner

## 2023-08-11 ENCOUNTER — Encounter: Payer: Self-pay | Admitting: Nurse Practitioner

## 2023-08-11 ENCOUNTER — Encounter (HOSPITAL_COMMUNITY): Payer: Self-pay

## 2023-08-11 VITALS — BP 148/80 | HR 68 | Temp 97.7°F | Ht 63.0 in | Wt 210.0 lb

## 2023-08-11 DIAGNOSIS — Z1231 Encounter for screening mammogram for malignant neoplasm of breast: Secondary | ICD-10-CM | POA: Diagnosis not present

## 2023-08-11 DIAGNOSIS — R350 Frequency of micturition: Secondary | ICD-10-CM | POA: Diagnosis not present

## 2023-08-11 DIAGNOSIS — N3281 Overactive bladder: Secondary | ICD-10-CM | POA: Diagnosis not present

## 2023-08-11 LAB — POCT URINALYSIS DIP (CLINITEK)
Bilirubin, UA: NEGATIVE
Blood, UA: NEGATIVE
Glucose, UA: NEGATIVE mg/dL
Ketones, POC UA: NEGATIVE mg/dL
Leukocytes, UA: NEGATIVE
Nitrite, UA: NEGATIVE
POC PROTEIN,UA: NEGATIVE
Spec Grav, UA: <=1.005 — AB (ref 1.010–1.025)
Urobilinogen, UA: 0.2 U/dL
pH, UA: 7.5 (ref 5.0–8.0)

## 2023-08-11 MED ORDER — MIRABEGRON ER 25 MG PO TB24: 25.0000 mg | ORAL_TABLET | Freq: Every day | ORAL | 5 refills | Status: DC

## 2023-08-11 NOTE — Progress Notes (Signed)
   Subjective:    Patient ID: Jessica Kirby, female    DOB: 1956-09-14, 67 y.o.   MRN: [id number]  HPI Presents to discuss her diagnosis of overactive bladder.  Having episodes of frequency and urgency.  Varies on time of day or activity.  Has noticed frequency at nighttime on occasion.  No fever or dysuria.  No hematuria.  Was diagnosed with OAB on 12/30/2022 with a pelvic exam and examination by gynecology.  No prolapse or cystocele noted.   Review of Systems  Constitutional:  Negative for fever.  Respiratory:  Negative for cough, chest tightness and shortness of breath.   Cardiovascular:  Negative for chest pain.  Genitourinary:  Positive for enuresis, frequency and urgency. Negative for dysuria and hematuria.       Objective:   Physical Exam NAD.  Alert, oriented.  Lungs clear.  Heart regular rate rhythm.  Abdomen soft nondistended nontender. Results for orders placed or performed in visit on 08/11/23  POCT URINALYSIS DIP (CLINITEK)  Result Value Ref Range   Color, UA yellow yellow   Clarity, UA clear clear   Glucose, UA negative negative mg/dL   Bilirubin, UA negative negative   Ketones, POC UA negative negative mg/dL   Spec Grav, UA <=7.253 (A) 1.010 - 1.025   Blood, UA negative negative   pH, UA 7.5 5.0 - 8.0   POC PROTEIN,UA negative negative, trace   Urobilinogen, UA 0.2 0.2 or 1.0 E.U./dL   Nitrite, UA Negative Negative   Leukocytes, UA Negative Negative   Today's Vitals   08/11/23 0847  BP: (!) 148/80  Pulse: 68  Temp: 97.7 F (36.5 C)  SpO2: 98%  Weight: 210 lb (95.3 kg)  Height: 5\' 3"  (1.6 m)   Body mass index is 37.2 kg/m.        Assessment & Plan:   Problem List Items Addressed This Visit       Genitourinary   Overactive bladder - Primary   Other Visit Diagnoses     Urinary frequency       Relevant Orders   POCT URINALYSIS DIP (CLINITEK) (Completed)      Meds ordered this encounter  Medications   mirabegron ER (MYRBETRIQ) 25 MG TB24  tablet    Sig: Take 1 tablet (25 mg total) by mouth daily.    Dispense:  30 tablet    Refill:  5    Order Specific Question:   Supervising Provider    Answer:   Lilyan Punt A [[id number]]   Discussed options including trial of a different medication or referral to urology.  Patient advised that some of the older medications may have more side effects.  Denies any significant adverse reactions to Myrbetriq.  At that time under insurance the cost was $47 per month which she states is fine. Restart Myrbetriq at previous dose.  Call back if any adverse effects or if no improvement in her symptoms.  Also advised patient to continue working on weight loss and monitor her diet and beverages to see if anything exacerbates her symptoms. Return for routine follow up in April 2025 as planned.

## 2023-08-12 ENCOUNTER — Other Ambulatory Visit: Payer: Self-pay | Admitting: Nurse Practitioner

## 2023-08-12 ENCOUNTER — Telehealth: Payer: Self-pay | Admitting: *Deleted

## 2023-08-12 DIAGNOSIS — R0981 Nasal congestion: Secondary | ICD-10-CM

## 2023-08-12 MED ORDER — FLUTICASONE PROPIONATE 50 MCG/ACT NA SUSP: 2.0000 | Freq: Every day | NASAL | 6 refills | Status: DC

## 2023-08-12 NOTE — Telephone Encounter (Signed)
Source  Jessica Kirby (Patient)   Subject  Jessica Kirby (Patient)   Topic  Clinical - Medication Refill    Summary  Pt wants to see if Dr. Adriana Simas or Dr. Isabell Jarvis can write a new prescription for Flonase and send to her pharmacy. States MyChart shows she has refills but pharmacy told her that she needs a new rx sent.  Communication  Most Recent Primary Care Visit:  Provider: Campbell Riches     Department: [facility]     Visit Type: OFFICE VISIT     Date: 08/11/2023        Medication: Fluticasone (Flonase)        Has the patient contacted their pharmacy? Yes, pharmacy stated she needs a new rx sent.    (Agent: If no, request that the patient contact the pharmacy for the refill. If patient does not wish to contact the pharmacy document the reason why and proceed with request.)    (Agent: If yes, when and what did the pharmacy advise?)        Is this the correct pharmacy for this prescription? Yes    If no, delete pharmacy and type the correct one.    This is the patient's preferred pharmacy:    [id number] - [place], [place] - [address]    [address]    [place] [address]    Phone: [phone] Fax: [phone]            Has the prescription been filled recently? Yes        Is the patient out of the medication? Yes        Has the patient been seen for an appointment in the last year OR does the patient have an upcoming appointment? Yes        Can we respond through MyChart? Yes        Agent: Please be advised that Rx refills may take up to 3 business days. We ask that you follow-up with your pharmacy.

## 2023-08-17 NOTE — Telephone Encounter (Signed)
Prescription sent in by provider 08/12/23

## 2023-08-18 ENCOUNTER — Ambulatory Visit (HOSPITAL_COMMUNITY): Payer: Medicare Other | Admitting: Clinical

## 2023-08-18 DIAGNOSIS — F321 Major depressive disorder, single episode, moderate: Secondary | ICD-10-CM

## 2023-08-18 DIAGNOSIS — F4322 Adjustment disorder with anxiety: Secondary | ICD-10-CM

## 2023-08-18 NOTE — Progress Notes (Signed)
IN PERSON   I connected with Jessica Kirby on 08/18/23 at  8:00 AM EDT in person and verified that I am speaking with the correct person using two identifiers.   Location: Patient: Office Provider: Office   I discussed the limitations of evaluation and management by telemedicine and the availability of in person appointments. The patient expressed understanding and agreed to proceed. (IN PERSON)   THERAPIST PROGRESS NOTE   Session Time: 8:00 AM 8:30 AM   Participation Level: Active   Behavioral Response: CasualAlertDepressed   Type of Therapy: Individual Therapy   Treatment Goals addressed: Depression and Greif   Interventions: CBT, Motivational Interviewing, Solution Focused and Strength-based   Summary: Jessica Kirby is a 67 y.o. female who presents with Depression /Adjustments Disorder/ unresolved grief. The OPT therapist worked with the patient for her ongoing OPT treatment. The OPT therapist utilized Motivational Interviewing to assist in creating therapeutic repore. The patient in the session was engaged and work in collaboration giving feedback about her triggers and symptoms over the past few weeks. The patient spoke about getting out into the community and being involved with more community events and  her church as well as going to dinner with friends and not isolating. The patient spoke about a noticeable change ongoing with her being at a new place of willingness to move forward and let go of her past. The patient indicated a noticeable difference with the impact of grief continuing and not being as present and this allowing her to make some choices with her independence and spoke about making plans for 2025. The OPT therapist utilized Cognitive Behavioral Therapy through cognitive restructuring as well as worked with the patient on coping strategies to assist in management of mood and being active. The OPT therapist promoted the patients work to challenge automatic negative  thinking and promoted positive mindset.The OPT therapist worked with the patient providing support and psycho-education. The patient spoke about focusing on herself and getting healthy. The patient has continued working out at the gym on a routine basis.  The OPT therapist worked with the patient on coping strategies including  journaling, using a planner, and getting out of the home more. The patient spoke about plans for the upcoming Thanksgiving and Christmas  holidays. The patient overviewed her upcoming health appointments as listed in patients MyChart. The OPT therapist along with the patient are in agreement at this time with the patient meeting her treatment goal with consistency for successful discharge.    Suicidal/Homicidal: Nowithout intent/plan   Therapist Response: The OPT therapist worked with the patient for the patients scheduled session. The patient was engaged in her session and gave feedback in relation to triggers, symptoms, and behavior responses over the past few weeks. The patient spoke about getting out of the home more and being more involved in community events and this keeping her busy.The OPT therapist worked with the patient utilizing an in session Cognitive Behavioral Therapy exercise. The patient was responsive in the session and verbalized, " I feel like am in control now of my future" . The OPT therapist worked with the patient reviewing coping strategies and promoting on going self check-ins..The OPT therapist continued to work with the patient on setting family boundaries, socialization, involvement in community and church activities, and maintaining in her areas of basic needs including sleep cycle, eating habits, hygiene, and physical exercise.The patient will continue to work on her physical health and attend all scheduled health appointments. The patient spoke about  her socialization including involvement with friends for social outings. The patient and the O)PT therapist  are in agreement at this time for a formal successful discharge from services.    Plan: Successful Discharge    Diagnosis:      Axis I: Major depressive disorder, single episode, moderate / Unresolved grief/ Adjustment Disorder with anxious mood                           Axis II: No diagnosis   Collaboration of Care: Collaboration of care for this session review of med therapy with Dr. Tenny Craw.   Patient/Guardian was advised Release of Information must be obtained prior to any record release in order to collaborate their care with an outside provider. Patient/Guardian was advised if they have not already done so to contact the registration department to sign all necessary forms in order for [place] to release information regarding their care.    Consent: Patient/Guardian gives verbal consent for treatment and assignment of benefits for services provided during this visit. Patient/Guardian expressed understanding and agreed to proceed.       I discussed the assessment and treatment plan with the patient. The patient was provided an opportunity to ask questions and all were answered. The patient agreed with the plan and demonstrated an understanding of the instructions.   The patient was advised to call back or seek an in-person evaluation if the symptoms worsen or if the condition fails to improve as anticipated.   I provided 30 minutes of face-to-face time during this encounter.   Suzan Garibaldi, LCSW   08/18/2023

## 2023-08-19 ENCOUNTER — Ambulatory Visit: Payer: Self-pay | Admitting: Family Medicine

## 2023-08-19 NOTE — Telephone Encounter (Signed)
Copied from CRM [id number]. Topic: Clinical - Red Word Triage >> Aug 19, 2023  8:35 AM Hector Shade B wrote: [facility] that prompted transfer to Nurse Triage: sliced the tip of finger and right now has it really tight but if bandage is removed it begins to bleed all over again  Chief Complaint:  Laceration Left hand pointer. It is cut at the tip where the fingernail . Patient  states it  has bleeding for a couple of hours but has now stopped.  Symptoms:  Frequency: A  couple of hours ago. Pertinent Negatives: Patient denies bleeding at this time. Disposition: [] ED /[] Urgent Care (no appt availability in office) / [] Appointment(In office/virtual)/ [x]  [facility]/ [] Home Care/ [] Refused Recommended Disposition /[] [facility]/ []  Follow-up with PCP Additional Notes:  Unable to locate Tetanus in record. Advised patient we will check to see if she is able to  worked in today for her tetanus. Patient declined Urgent Care. Reason for Disposition  No prior tetanus shots (or is not fully vaccinated)  Answer Assessment - Initial Assessment Questions 1. APPEARANCE of INJURY: "What does the injury look like?"       2. SIZE: "How large is the cut?"       Chunk meat- it is  has a flap on the tip of the finger 3. BLEEDING: "Is it bleeding now?" If Yes, ask: "Is it difficult to stop?"      yes 4. LOCATION: "Where is the injury located?"  Left hand pointer finger at the tip close to fingernail- Patient denies any amputation of the tip . She does say it is a chunk of meat flapping.      5. ONSET: "How long ago did the injury occur?"       Couple of hours ago  6. MECHANISM: "Tell me how it happened."       Moving a metal airconditioner and my finger got hung on it  7. TETANUS: "When was the last tetanus booster?"     Unsure  Protocols used: Cuts and Lacerations-A-AH

## 2023-08-20 ENCOUNTER — Ambulatory Visit (INDEPENDENT_AMBULATORY_CARE_PROVIDER_SITE_OTHER): Payer: Medicare Other | Admitting: Nurse Practitioner

## 2023-08-20 ENCOUNTER — Encounter: Payer: Self-pay | Admitting: Nurse Practitioner

## 2023-08-20 VITALS — BP 136/75 | Ht 63.0 in | Wt 211.4 lb

## 2023-08-20 DIAGNOSIS — S61211A Laceration without foreign body of left index finger without damage to nail, initial encounter: Secondary | ICD-10-CM | POA: Diagnosis not present

## 2023-08-20 NOTE — Progress Notes (Signed)
   Subjective:    Patient ID: Jessica Kirby, female    DOB: Feb 02, 1956, 67 y.o.   MRN: [id number]  HPI  Patient arrive with a laceration to left index finger. Patient states she slightly cut her finger yesterday while moving a air conditioner.  Bleeding has stopped.  No fever.  Minimal drainage at this time.  Was told she may need a tetanus vaccine.  Has been applying Neosporin and a Band-Aid.      Objective:   Physical Exam NAD.  Alert, oriented.  Superficial small laceration noted on the tip of the left index finger.  No nail involvement.  Minimally tender to palpation.  Superficial. Today's Vitals   08/20/23 1358  BP: 136/75  Weight: 211 lb 6.4 oz (95.9 kg)  Height: 5\' 3"  (1.6 m)   Body mass index is 37.45 kg/m.       Assessment & Plan:  Laceration of left index finger without foreign body without damage to nail, initial encounter Confirmation from the record that she received Tdap November 2019.  No tetanus booster needed at this time. Continue to keep the wound clean and dry apply Neosporin and a Band-Aid.  Warning signs reviewed as far as signs of infection.  Call back if any further problems.

## 2023-08-23 ENCOUNTER — Ambulatory Visit: Payer: Medicare Other | Admitting: Gastroenterology

## 2023-08-23 ENCOUNTER — Encounter: Payer: Self-pay | Admitting: Gastroenterology

## 2023-08-23 VITALS — BP 138/86 | HR 67 | Temp 98.6°F | Ht 63.0 in | Wt 212.6 lb

## 2023-08-23 DIAGNOSIS — K219 Gastro-esophageal reflux disease without esophagitis: Secondary | ICD-10-CM | POA: Diagnosis not present

## 2023-08-23 DIAGNOSIS — R1310 Dysphagia, unspecified: Secondary | ICD-10-CM

## 2023-08-23 DIAGNOSIS — K5900 Constipation, unspecified: Secondary | ICD-10-CM | POA: Diagnosis not present

## 2023-08-23 DIAGNOSIS — K5904 Chronic idiopathic constipation: Secondary | ICD-10-CM

## 2023-08-23 NOTE — Progress Notes (Signed)
GI Office Note    Referring Provider: Tommie Sams, DO Primary Care Physician:  Tommie Sams, DO Primary Gastroenterologist: Dolores Frame, MD  Date:  08/23/2023  ID:  BRITLEY Kirby, DOB 06/18/1956, MRN [id number]   Chief Complaint   Chief Complaint  Patient presents with   Follow-up    Follow up acid reflux, swallowing and constipation. Pt doing better she states swallowing better    History of Present Illness  Jessica Kirby is a 67 y.o. female with a history of constipation, GERD, diabetes, hypothyroidism, anxiety, depression presenting today for follow up post EGD.   Last colonoscopy in 2017: -Sigmoid and descending diverticulosis   OV 10/15/2022.  Noted decrease in regurgitation issues since she came off potassium pills continue Protonix.  Previously takes this about twice per week when she eats something she knows she should not.  Has heartburn symptoms occasionally.  Denies any dysphagia or odynophagia, nausea vomiting, early satiety.  Tries to avoid spicy foods, caffeine, greasy foods.  Struggling with some constipation during a bowel movement once a day but occasionally she may skip a day.  Feels that this may be related to HCTZ.  Stools hard at times, takes MiraLAX as needed but makes her feel bloated.  Has tried increasing fiber in diet.  Occasionally has to strain. Advised PPI as needed and reflux precautions.  Start Benefiber 1 tablespoon twice daily with meals and for 64 ounces of water daily and increase fiber in her diet.   OV  02/17/23.  Reported frequent nightly throat irritation patient is getting stuck in her throat at times.  Denied any odynophagia or hoarseness.  Also having issues with cough at night around 4-5 PM.  Usually takes her multivitamin with applesauce.  Also having a frequent dry cough.  Reports stools have been a lighter color but usually brown.  Stools have been hard at times. Advised Carafate for 2 weeks, low dose PPI once daily.  Applesauce for medications. Daily stool softener and miralax as needed. High fiber diet and benefiber.    ED visit in May for atypical chest pain and found to have low potassium and EKG with NSR. Followed up with pcp and BP medications changed. (Hydrochlorothiazide and valsartan stopped and started on amlodipine and continued on metoprolol)   Potassium stable on 03/03/23. LFTs also normal.   OV 03/29/23. Ongoing constipation with hard stools and straining. Reporting pill dysphagia and regurgitation. Taking medication with applesauce. Powdery feeling on tongue causing need to drink many fluids. Thinks stools had been white at time due to certain medications. BPE ordered. Advised colace daily for constipation and miralax once weekly. Encouraged daily use of PPI.    BPE 04/02/23: no stricture or mass identified. No hernia. (EGD offered - patient preferred to monitor and discuss at follow up)   Last office visit 05/24/23. Feels like she is able to taste her medications, back up by the end the day.  Has gotten Callow stuff at times as well as a couple of her pills, feel like stuck mid chest.  Reportedly if she eats the wrong thing she has regurgitation at night and tries to avoid fried foods and spicy foods.  Using MiraLAX couple times per week and has been trying to increase her fiber.  She discussed possibly wanting to increase her use of MiraLAX to 3-4 times per week.  Mostly having a bowel movement once per day but stools have continued to be hard, bulky, and dry.  No abdominal  pain.Advised increase pantoprazole to 20 mg twice daily, schedule EGD with dilation.  Advised to increase MiraLAX to 3-4 times per week and increase stool softener to twice daily.  Chewing precautions discussed.  EGD 06/03/23: - No endoscopic esophageal abnormality to explain patient' s dysphagia. S/p dilation  - 1 cm hiatal hernia. - Normal stomach.  - Normal examined duodenum.  - No specimens collected - Continue pantoprazole 20 mg  BID, if no improvement consider increasing to 40 mg BID  Today:  If she drinks plenty then pills go down fine. Switched her multivitamin to centrum silver as it is more coated than previously. Has started tacking a vitamin for her eyes as well. Swallowing foods and medications are improved since dilation. No abdominal pain, N/V. Reflux itself has been fairly well controlled with once daily pantoprazole and has been avoiding her typical triggers.  Tries not to eat after 6pm. Also working on cutting back on caffeine as well. States she did lose a little weight (down to 207).   Not taking miralax everyday. Only tacking as needed Has bene going everyday on her own. Eating raisen bran and coffee and bananas. Has bene trying to be more active as well. No melena, brbpr, mucous in stools. Takes colace as needed (1 tablet).   She does report blood in her urine recently and got antibiotics and was going easier with BM then as well. This issues has cleared.   Has not been on fluid pills - if she takes it on a daily basis she has significantly low potassium. In the past she has taken oral potassium. She repots feeling palpitations at times with exercise. She had a heart cath and it was negative.   Wt Readings from Last 3 Encounters:  08/23/23 212 lb 9.6 oz (96.4 kg)  08/20/23 211 lb 6.4 oz (95.9 kg)  08/11/23 210 lb (95.3 kg)   Current Outpatient Medications  Medication Sig Dispense Refill   Accu-Chek Softclix Lancets lancets USE TO CHECK BLOOD SUGAR DAILY BEFORE MEALS THREE TIMES DAILY 200 each 1   ALPRAZolam (XANAX) 0.5 MG tablet TAKE 1 TABLET(0.5 MG) BY MOUTH DAILY AS NEEDED FOR ANXIETY 30 tablet 3   amLODipine (NORVASC) 5 MG tablet Take 1 tablet (5 mg total) by mouth daily. 90 tablet 3   aspirin EC 81 MG tablet Take 81 mg by mouth daily.      blood glucose meter kit and supplies Dispense based on patient and insurance preference. Use to test glucose twice daily  (FOR ICD-10,  E11.9). 1 each 5    docusate sodium (COLACE) 50 MG capsule Take 50 mg by mouth daily. Takes 2 capsules daily     fluticasone (FLONASE) 50 MCG/ACT nasal spray Place 2 sprays into both nostrils daily. 16 g 6   glucose blood (ACCU-CHEK GUIDE) test strip USE TO CHECK BLOOD SUGARS DAILY BEFORE MEALS 100 strip 3   levocetirizine (XYZAL) 5 MG tablet Take 1 tablet (5 mg total) by mouth every evening. 90 tablet 3   metoprolol succinate (TOPROL-XL) 25 MG 24 hr tablet TAKE 1 TABLET(25 MG) BY MOUTH DAILY 90 tablet 3   mirabegron ER (MYRBETRIQ) 25 MG TB24 tablet Take 1 tablet (25 mg total) by mouth daily. 30 tablet 5   pantoprazole (PROTONIX) 20 MG tablet Take 1 tablet (20 mg total) by mouth daily. 90 tablet 3   polyethylene glycol (MIRALAX / GLYCOLAX) 17 g packet Take 17 g by mouth daily.     rosuvastatin (CRESTOR) 10 MG tablet  TAKE 1 TABLET(10 MG) BY MOUTH DAILY 90 tablet 3   No current facility-administered medications for this visit.    Past Medical History:  Diagnosis Date   Allergy    Anxiety    Insomnia   Depression    Insomnia   Diabetes mellitus without complication (HCC) 11/28/2020   GERD (gastroesophageal reflux disease)    Herniated disc    Hypertension    Hypothyroidism    not medicated at this time   Osteoporosis    Thyroid disease    Hypothyroidism    Past Surgical History:  Procedure Laterality Date   ABDOMINAL HYSTERECTOMY     COLONOSCOPY N/A 04/07/2016   Procedure: COLONOSCOPY;  Surgeon: Corbin Ade, MD;  [facility];  Service: Endoscopy;  Laterality: N/A;  8:30 AM   ESOPHAGOGASTRODUODENOSCOPY (EGD) WITH PROPOFOL N/A 06/03/2023   Procedure: ESOPHAGOGASTRODUODENOSCOPY (EGD) WITH PROPOFOL;  Surgeon: Dolores Frame, MD;  [facility];  Service: Gastroenterology;  Laterality: N/A;  11:30 am, asa 2   LEFT HEART CATH AND CORONARY ANGIOGRAPHY N/A 07/01/2022   Procedure: LEFT HEART CATH AND CORONARY ANGIOGRAPHY;  Surgeon: Lennette Bihari, MD;  [facility];  Service: Cardiovascular;  Laterality: N/A;   SAVORY DILATION N/A 06/03/2023   Procedure: SAVORY DILATION;  Surgeon: Dolores Frame, MD;  [facility];  Service: Gastroenterology;  Laterality: N/A;   TUBAL LIGATION     WRIST SURGERY      Family History  Problem Relation Age of Onset   Diabetes Father    Heart attack Mother    Heart disease Mother    Bladder Cancer Other    Colon cancer Neg Hx     Allergies as of 08/23/2023 - Review Complete 08/23/2023  Allergen Reaction Noted   Fosamax [alendronate sodium] Other (See Comments) 12/21/2012   Vioxx [rofecoxib]  12/21/2012   Losartan Cough 06/13/2015   Sulfa antibiotics Hives 09/10/2011    Social History   Socioeconomic History   Marital status: Widowed    Spouse name: Not on file   Number of children: Not on file   Years of education: Not on file   Highest education level: Not on file  Occupational History   Not on file  Tobacco Use   Smoking status: Never    Passive exposure: Yes   Smokeless tobacco: Never  Vaping Use   Vaping status: Never Used  Substance and Sexual Activity   Alcohol use: No    Alcohol/week: 0.0 standard drinks of alcohol   Drug use: No   Sexual activity: Yes  Other Topics Concern   Not on file  Social History Narrative   Not on file   Social Determinants of Health   Financial Resource Strain: Low Risk  (05/14/2023)   Overall Financial Resource Strain (CARDIA)    Difficulty of Paying Living Expenses: Not hard at all  Food Insecurity: No Food Insecurity (05/14/2023)   Hunger Vital Sign    Worried About Running Out of Food in the Last Year: Never true    Ran Out of Food in the Last Year: Never true  Transportation Needs: No Transportation Needs (05/14/2023)   PRAPARE - Administrator, Civil Service (Medical): No    Lack of Transportation (Non-Medical): No  Physical Activity: Sufficiently Active (05/14/2023)   Exercise Vital Sign    Days of Exercise per  Week: 7 days    Minutes of Exercise per Session: 30 min  Stress:  No Stress Concern Present (05/14/2023)   [employer] of Occupational Health - Occupational Stress Questionnaire    Feeling of Stress : Not at all  Social Connections: Moderately Integrated (05/14/2023)   Social Connection and Isolation Panel [NHANES]    Frequency of Communication with Friends and Family: More than three times a week    Frequency of Social Gatherings with Friends and Family: More than three times a week    Attends Religious Services: More than 4 times per year    Active Member of [employer] or Organizations: Yes    Attends Banker Meetings: More than 4 times per year    Marital Status: Widowed     Review of Systems   Gen: Denies fever, chills, anorexia. Denies fatigue, weakness, weight loss.  CV: Denies chest pain, palpitations, syncope, peripheral edema, and claudication. Resp: Denies dyspnea at rest, cough, wheezing, coughing up blood, and pleurisy. GI: See HPI Derm: Denies rash, itching, dry skin Psych: Denies depression, anxiety, memory loss, confusion. No homicidal or suicidal ideation.  Heme: Denies bruising, bleeding, and enlarged lymph nodes.  Physical Exam   BP 138/86   Pulse 67   Temp 98.6 F (37 C)   Ht 5\' 3"  (1.6 m)   Wt 212 lb 9.6 oz (96.4 kg)   BMI 37.66 kg/m   General:   Alert and oriented. No distress noted. Pleasant and cooperative.  Head:  Normocephalic and atraumatic. Eyes:  Conjuctiva clear without scleral icterus. Mouth:  Oral mucosa pink and moist. Good dentition. No lesions. Lungs:  Clear to auscultation bilaterally. No wheezes, rales, or rhonchi. No distress.  Heart:  S1, S2 present without murmurs appreciated.  Abdomen:  +BS, soft, non-tender and non-distended. No rebound or guarding. No HSM or masses noted. Rectal: deferred Msk:  Symmetrical without gross deformities. Normal posture. Extremities:  Without edema. Neurologic:  Alert and  oriented  x4 Psych:  Alert and cooperative. Normal mood and affect.  Assessment  Jessica Kirby is a 67 y.o. female with a history of GERD, constipation, diabetes, hypothyroidism, and anxiety/depression presenting today for follow up post procedure.   Constipation: Not needing on as frequent of a basis anymore.  Recent antibiotic use for possible UTI made bowel movements softer and easier to go and has been maintaining a daily bowel movement since without significant straining.  Only uses Colace and MiraLAX as needed.  GERD: Well controlled with dietary avoidance of triggers and pantoprazole 20 mg once daily. Will continue current regimen. EGD recently unremarkable other than small 1 cm hiatal hernia. GERD diet reinforced. Discussed weight loss. Provided information on calorie reduction.   Dysphagia: Significantly improved since EGD.  Recently underwent EGD with normal esophagus but with empiric dilation performed and is experiencing relief of symptoms currently.  Will continue PPI.  PLAN   Continue pantoprazole 20 mg once daily.  High-fiber diet Colace one tablet as needed MiraLAX as needed GERD diet Discussed reduction in calories for weight loss. Provided goals.  Follow up in 6 months    Brooke Bonito, MSN, FNP-BC, AGACNP-BC [facility]  I have reviewed the note and agree with the APP's assessment as described in this progress note  Katrinka Blazing, MD Gastroenterology and Hepatology [facility]

## 2023-08-23 NOTE — Patient Instructions (Addendum)
Continue pantoprazole 20 mg once daily.  Follow a GERD diet:  Avoid fried, fatty, greasy, spicy, citrus foods. Avoid caffeine and carbonated beverages. Avoid chocolate. Try eating 4-6 small meals a day rather than 3 large meals. Do not eat within 3 hours of laying down. Prop head of bed up on wood or bricks to create a 6 inch incline.  Continue to follow a high-fiber diet and use your stool softener and MiraLAX as needed.  You are recommended calorie intake daily to help lose weight is 1241-1491.  You should aim for about 95 g of protein per day.  Follow up in 6 months, sooner if needed.   It was a pleasure to see you today. I want to create trusting relationships with patients. If you receive a survey regarding your visit,  I greatly appreciate you taking time to fill this out on paper or through your MyChart. I value your feedback.  Brooke Bonito, MSN, FNP-BC, AGACNP-BC [facility]

## 2023-09-12 ENCOUNTER — Encounter: Payer: Self-pay | Admitting: Emergency Medicine

## 2023-09-12 ENCOUNTER — Ambulatory Visit
Admission: EM | Admit: 2023-09-12 | Discharge: 2023-09-12 | Disposition: A | Discharge status: Home / Self Care | Payer: Medicare Other | Attending: Family Medicine | Admitting: Family Medicine

## 2023-09-12 DIAGNOSIS — J3089 Other allergic rhinitis: Secondary | ICD-10-CM

## 2023-09-12 MED ORDER — AZELASTINE HCL 0.1 % NA SOLN: 1.0000 | Freq: Two times a day (BID) | NASAL | 2 refills | Status: DC

## 2023-09-12 NOTE — Discharge Instructions (Signed)
Take your allergy medications consistently every day.  I have added an additional nasal spray to help with the pressure and drainage.  You may also do humidifiers, Coricidin HBP, sinus rinses

## 2023-09-12 NOTE — ED Triage Notes (Signed)
Pt presents to UC for a intermittent dry cough, post nasal drip at night x 1 day and feels like fluid in the right ear and decreased hearing on the right side. States has taken Claritin and Flonase.

## 2023-09-12 NOTE — ED Provider Notes (Signed)
[facility]    CSN: [id number] Arrival date & time: 09/12/23  0849      History   Chief Complaint Chief Complaint  Patient presents with   Post-Nasal Drip   Cough   Ear Problem    HPI Jessica Kirby is a 67 y.o. female.   Presenting today with 1 day history of postnasal drip, intermittent dry cough, ear pressure, rhinorrhea.  Denies fever, chills, body aches, chest pain, shortness of breath, abdominal pain, nausea vomiting or diarrhea.  States she has Claritin and Flonase but does not take them regularly, sometimes feels like her sinuses get worse if she takes the Flonase too much.  No known sick contacts recently.    Past Medical History:  Diagnosis Date   Allergy    Anxiety    Insomnia   Depression    Insomnia   Diabetes mellitus without complication (HCC) 11/28/2020   GERD (gastroesophageal reflux disease)    Herniated disc    Hypertension    Hypothyroidism    not medicated at this time   Osteoporosis    Thyroid disease    Hypothyroidism    Patient Active Problem List   Diagnosis Date Noted   Overactive bladder 08/11/2023   Proteinuria 12/09/2022   Chronic idiopathic constipation 10/15/2022   Hyperlipidemia 03/25/2022   Diabetes mellitus without complication (HCC) 11/28/2020   Anxiety 11/04/2015   GERD (gastroesophageal reflux disease) 09/27/2013   Hypertension    Osteopenia     Past Surgical History:  Procedure Laterality Date   ABDOMINAL HYSTERECTOMY     COLONOSCOPY N/A 04/07/2016   Procedure: COLONOSCOPY;  Surgeon: Corbin Ade, MD;  [facility];  Service: Endoscopy;  Laterality: N/A;  8:30 AM   ESOPHAGOGASTRODUODENOSCOPY (EGD) WITH PROPOFOL N/A 06/03/2023   Procedure: ESOPHAGOGASTRODUODENOSCOPY (EGD) WITH PROPOFOL;  Surgeon: Dolores Frame, MD;  [facility];  Service: Gastroenterology;  Laterality: N/A;  11:30 am, asa 2   LEFT HEART CATH AND CORONARY ANGIOGRAPHY N/A 07/01/2022   Procedure: LEFT HEART  CATH AND CORONARY ANGIOGRAPHY;  Surgeon: Lennette Bihari, MD;  [facility];  Service: Cardiovascular;  Laterality: N/A;   SAVORY DILATION N/A 06/03/2023   Procedure: SAVORY DILATION;  Surgeon: Dolores Frame, MD;  [facility];  Service: Gastroenterology;  Laterality: N/A;   TUBAL LIGATION     WRIST SURGERY      OB History   No obstetric history on file.      Home Medications    Prior to Admission medications   Medication Sig Start Date End Date Taking? Authorizing Provider  azelastine (ASTELIN) 0.1 % nasal spray Place 1 spray into both nostrils 2 (two) times daily. Use in each nostril as directed 09/12/23  Yes Particia Nearing, PA-C  Accu-Chek Softclix Lancets lancets USE TO CHECK BLOOD SUGAR DAILY BEFORE MEALS THREE TIMES DAILY 07/27/23   Adriana Simas, Verdis Frederickson, DO  ALPRAZolam (XANAX) 0.5 MG tablet TAKE 1 TABLET(0.5 MG) BY MOUTH DAILY AS NEEDED FOR ANXIETY 04/06/23   Everlene Other G, DO  amLODipine (NORVASC) 5 MG tablet Take 1 tablet (5 mg total) by mouth daily. 04/19/23   Tommie Sams, DO  aspirin EC 81 MG tablet Take 81 mg by mouth daily.     [provider]  blood glucose meter kit and supplies Dispense based on patient and insurance preference. Use to test glucose twice daily  (FOR ICD-10,  E11.9). 12/03/20   Annalee Genta, DO  docusate sodium (COLACE) 50 MG capsule Take 50 mg by mouth daily. Takes 2 capsules daily    [provider]  fluticasone (FLONASE) 50 MCG/ACT nasal spray Place 2 sprays into both nostrils daily. 08/12/23   Campbell Riches, NP  glucose blood (ACCU-CHEK GUIDE) test strip USE TO CHECK BLOOD SUGARS DAILY BEFORE MEALS 04/06/23   Cook, Jayce G, DO  levocetirizine (XYZAL) 5 MG tablet Take 1 tablet (5 mg total) by mouth every evening. 03/03/23   Tommie Sams, DO  metoprolol succinate (TOPROL-XL) 25 MG 24 hr tablet TAKE 1 TABLET(25 MG) BY MOUTH DAILY 04/06/23   Hilty, Lisette Abu, MD  mirabegron ER (MYRBETRIQ) 25 MG TB24  tablet Take 1 tablet (25 mg total) by mouth daily. 08/11/23   Campbell Riches, NP  pantoprazole (PROTONIX) 20 MG tablet Take 1 tablet (20 mg total) by mouth daily. 07/20/23   Tommie Sams, DO  polyethylene glycol (MIRALAX / GLYCOLAX) 17 g packet Take 17 g by mouth daily.    [provider]  rosuvastatin (CRESTOR) 10 MG tablet TAKE 1 TABLET(10 MG) BY MOUTH DAILY 05/26/23   Tommie Sams, DO    Family History Family History  Problem Relation Age of Onset   Diabetes Father    Heart attack Mother    Heart disease Mother    Bladder Cancer Other    Colon cancer Neg Hx     Social History Social History   Tobacco Use   Smoking status: Never    Passive exposure: Yes   Smokeless tobacco: Never  Vaping Use   Vaping status: Never Used  Substance Use Topics   Alcohol use: No    Alcohol/week: 0.0 standard drinks of alcohol   Drug use: No     Allergies   Fosamax [alendronate sodium], Vioxx [rofecoxib], Losartan, and Sulfa antibiotics   Review of Systems Review of Systems Per HPI  Physical Exam Triage Vital Signs ED Triage Vitals  Encounter Vitals Group     BP 09/12/23 0900 (!) 149/89     Systolic BP Percentile --      Diastolic BP Percentile --      Pulse Rate 09/12/23 0900 78     Resp 09/12/23 0900 16     Temp 09/12/23 0900 98.2 F (36.8 C)     Temp Source 09/12/23 0900 Oral     SpO2 09/12/23 0900 98 %     Weight --      Height --      Head Circumference --      Peak Flow --      Pain Score 09/12/23 0857 5     Pain Loc --      Pain Education --      Exclude from Growth Chart --    No data found.  Updated Vital Signs BP (!) 149/89 (BP Location: Right Arm)   Pulse 78   Temp 98.2 F (36.8 C) (Oral)   Resp 16   SpO2 98%   Visual Acuity Right Eye Distance:   Left Eye Distance:   Bilateral Distance:    Right Eye Near:   Left Eye Near:    Bilateral Near:     Physical Exam Vitals and nursing note reviewed.  Constitutional:      Appearance:  Normal appearance.  HENT:     Head: Atraumatic.     Right Ear: Tympanic membrane and external ear normal.     Left Ear: Tympanic membrane and external ear normal.  Nose: Rhinorrhea present.     Mouth/Throat:     Mouth: Mucous membranes are moist.     Pharynx: Posterior oropharyngeal erythema present.  Eyes:     Extraocular Movements: Extraocular movements intact.     Conjunctiva/sclera: Conjunctivae normal.  Cardiovascular:     Rate and Rhythm: Normal rate and regular rhythm.     Heart sounds: Normal heart sounds.  Pulmonary:     Effort: Pulmonary effort is normal.     Breath sounds: Normal breath sounds. No wheezing.  Musculoskeletal:        General: Normal range of motion.     Cervical back: Normal range of motion and neck supple.  Skin:    General: Skin is warm and dry.  Neurological:     Mental Status: She is alert and oriented to person, place, and time.  Psychiatric:        Mood and Affect: Mood normal.        Thought Content: Thought content normal.      UC Treatments / Results  Labs (all labs ordered are listed, but only abnormal results are displayed) Labs Reviewed - No data to display  EKG   Radiology No results found.  Procedures Procedures (including critical care time)  Medications Ordered in UC Medications - No data to display  Initial Impression / Assessment and Plan / UC Course  I have reviewed the triage vital signs and the nursing notes.  Pertinent labs & imaging results that were available during my care of the patient were reviewed by me and considered in my medical decision making (see chart for details).     Suspect uncontrolled seasonal allergies.  Treat with Astelin, Claritin, Flonase, sinus rinses and decongestants as needed.  Return for worsening symptoms.  Final Clinical Impressions(s) / UC Diagnoses   Final diagnoses:  Seasonal allergic rhinitis due to other allergic trigger     Discharge Instructions      Take your  allergy medications consistently every day.  I have added an additional nasal spray to help with the pressure and drainage.  You may also do humidifiers, Coricidin HBP, sinus rinses    ED Prescriptions     Medication Sig Dispense Auth. Provider   azelastine (ASTELIN) 0.1 % nasal spray Place 1 spray into both nostrils 2 (two) times daily. Use in each nostril as directed 30 mL Particia Nearing, PA-C      PDMP not reviewed this encounter.   Particia Nearing, [place] 09/12/23 1452

## 2023-09-20 ENCOUNTER — Other Ambulatory Visit: Payer: Self-pay | Admitting: Family Medicine

## 2023-09-20 ENCOUNTER — Telehealth: Payer: Self-pay | Admitting: *Deleted

## 2023-09-20 DIAGNOSIS — E1140 Type 2 diabetes mellitus with diabetic neuropathy, unspecified: Secondary | ICD-10-CM | POA: Diagnosis not present

## 2023-09-20 DIAGNOSIS — M79675 Pain in left toe(s): Secondary | ICD-10-CM | POA: Diagnosis not present

## 2023-09-20 DIAGNOSIS — M79672 Pain in left foot: Secondary | ICD-10-CM | POA: Diagnosis not present

## 2023-09-20 DIAGNOSIS — M79674 Pain in right toe(s): Secondary | ICD-10-CM | POA: Diagnosis not present

## 2023-09-20 DIAGNOSIS — L110 Acquired keratosis follicularis: Secondary | ICD-10-CM | POA: Diagnosis not present

## 2023-09-20 DIAGNOSIS — M79671 Pain in right foot: Secondary | ICD-10-CM | POA: Diagnosis not present

## 2023-09-20 MED ORDER — PROMETHAZINE-DM 6.25-15 MG/5ML PO SYRP: 5.0000 mL | ORAL_SOLUTION | Freq: Four times a day (QID) | ORAL | 0 refills | Status: DC | PRN

## 2023-09-20 NOTE — Telephone Encounter (Signed)
Cook, Jayce G, DO   ? ?Rx sent.   ? ?

## 2023-09-20 NOTE — Telephone Encounter (Signed)
Patient notified

## 2023-09-20 NOTE — Telephone Encounter (Signed)
Copied from CRM [id number]. Topic: Clinical - Medication Question >> Sep 20, 2023  3:37 PM Elle L wrote: Reason for CRM: The patient states that she went to urgent care 09/12/2023 for allergies but they did not prescribe her anything. She has been taking over-the-counter medications but she still has a cough. She is requesting that a prescription be sent to [id number] - [place], [place] - [address] for her.

## 2023-10-01 ENCOUNTER — Other Ambulatory Visit: Payer: Self-pay | Admitting: Internal Medicine

## 2023-10-18 ENCOUNTER — Ambulatory Visit (INDEPENDENT_AMBULATORY_CARE_PROVIDER_SITE_OTHER): Payer: Medicare Other | Admitting: Gastroenterology

## 2023-10-24 ENCOUNTER — Ambulatory Visit (INDEPENDENT_AMBULATORY_CARE_PROVIDER_SITE_OTHER): Payer: Medicare Other

## 2023-10-24 ENCOUNTER — Ambulatory Visit
Admission: EM | Admit: 2023-10-24 | Discharge: 2023-10-24 | Disposition: A | Discharge status: Home / Self Care | Payer: Medicare Other | Attending: Physician Assistant | Admitting: Physician Assistant

## 2023-10-24 DIAGNOSIS — W19XXXA Unspecified fall, initial encounter: Secondary | ICD-10-CM | POA: Diagnosis not present

## 2023-10-24 DIAGNOSIS — S66911A Strain of unspecified muscle, fascia and tendon at wrist and hand level, right hand, initial encounter: Secondary | ICD-10-CM

## 2023-10-24 DIAGNOSIS — I10 Essential (primary) hypertension: Secondary | ICD-10-CM

## 2023-10-24 DIAGNOSIS — M79641 Pain in right hand: Secondary | ICD-10-CM | POA: Diagnosis not present

## 2023-10-24 DIAGNOSIS — M25531 Pain in right wrist: Secondary | ICD-10-CM | POA: Diagnosis not present

## 2023-10-24 DIAGNOSIS — S63501A Unspecified sprain of right wrist, initial encounter: Secondary | ICD-10-CM

## 2023-10-24 DIAGNOSIS — M8580 Other specified disorders of bone density and structure, unspecified site: Secondary | ICD-10-CM | POA: Diagnosis not present

## 2023-10-24 DIAGNOSIS — M1811 Unilateral primary osteoarthritis of first carpometacarpal joint, right hand: Secondary | ICD-10-CM | POA: Diagnosis not present

## 2023-10-24 MED ORDER — DICLOFENAC SODIUM 1 % EX GEL: 2.0000 g | Freq: Four times a day (QID) | CUTANEOUS | 0 refills | Status: DC

## 2023-10-24 NOTE — Discharge Instructions (Signed)
As we discussed, the safest thing to do is to go to the emergency room for head CT since you hit your head.  Since we are not doing that, I want you to follow-up with your primary care tomorrow.  If anything changes and you have headache, dizziness, nausea, vomiting, vision change you need to go to the ER immediately.  Please stay in contact with your neighbors and family and have them check on you regularly.  Your x-rays did not show any evidence of broken bone.  I suspect you have injured the soft tissue (ligaments and tendons) in your wrist.  Keep this elevated and use the brace for comfort and support.  Apply diclofenac gel up to 4 times a day to help with pain.  You can also take Tylenol.  Follow-up with your primary care.  If anything worsens or changes please return for reevaluation.  Your blood pressure is elevated.  Please go home and take your medication.  Monitor your blood pressure at home and follow-up with your primary care tomorrow.  If you have any chest pain, shortness of breath, headache, vision change, dizziness in the setting of high blood pressure you need to go to the emergency room immediately.

## 2023-10-24 NOTE — ED Triage Notes (Signed)
Pt reports fall yesterday that has caused injury to her right hand and wrist, pt states she fell on asphalt, landing on her hand and knees, hitting her head denies losing conscientiousness during all. Feels pain in the wrist, when trying to pick up things.

## 2023-10-24 NOTE — ED Provider Notes (Signed)
[facility]    CSN: [id number] Arrival date & time: 10/24/23  4098      History   Chief Complaint No chief complaint on file.   HPI Jessica Kirby is a 68 y.o. female.   Patient presents today for evaluation after a fall that occurred yesterday (10/23/2023).  She reports that she was carrying groceries and when she took a misstep and fell forward.  She did hit the front of her head but denies any loss of consciousness, headache, dizziness, nausea, vomiting.  She takes a baby aspirin but does not take any additional blood thinning medication.  Her primary concern today is right hand and wrist pain.  She reports that this is rated 6 on a 0-10 pain scale, localized to her radial wrist with radiation into hand, described as intense aching with periodic sharp pains, worse with attempted grip or extension of the wrist, no alleviating factors identified.  She denies previous injury or surgery involving her right wrist; has had surgery on her left wrist due to a fall.  She denies any numbness or paresthesias in her hand.  She has taken Tylenol as well as applied ice and use topical medications without improvement of symptoms.  She is right-handed.  Her blood pressure is elevated.  She has not taken her medication today.  She denies any headache, dizziness, chest pain, shortness of breath, visual disturbance.  She has not taken NSAIDs recently denies any recent decongestant use.  She is able to monitor her blood pressure at home.    Past Medical History:  Diagnosis Date   Allergy    Anxiety    Insomnia   Depression    Insomnia   Diabetes mellitus without complication (HCC) 11/28/2020   GERD (gastroesophageal reflux disease)    Herniated disc    Hypertension    Hypothyroidism    not medicated at this time   Osteoporosis    Thyroid disease    Hypothyroidism    Patient Active Problem List   Diagnosis Date Noted   Overactive bladder 08/11/2023   Proteinuria 12/09/2022    Chronic idiopathic constipation 10/15/2022   Hyperlipidemia 03/25/2022   Diabetes mellitus without complication (HCC) 11/28/2020   Anxiety 11/04/2015   GERD (gastroesophageal reflux disease) 09/27/2013   Hypertension    Osteopenia     Past Surgical History:  Procedure Laterality Date   ABDOMINAL HYSTERECTOMY     COLONOSCOPY N/A 04/07/2016   Procedure: COLONOSCOPY;  Surgeon: Corbin Ade, MD;  Location: [facility];  Service: Endoscopy;  Laterality: N/A;  8:30 AM   ESOPHAGOGASTRODUODENOSCOPY (EGD) WITH PROPOFOL N/A 06/03/2023   Procedure: ESOPHAGOGASTRODUODENOSCOPY (EGD) WITH PROPOFOL;  Surgeon: Dolores Frame, MD;  Location: [facility];  Service: Gastroenterology;  Laterality: N/A;  11:30 am, asa 2   LEFT HEART CATH AND CORONARY ANGIOGRAPHY N/A 07/01/2022   Procedure: LEFT HEART CATH AND CORONARY ANGIOGRAPHY;  Surgeon: Lennette Bihari, MD;  Location: [facility];  Service: Cardiovascular;  Laterality: N/A;   SAVORY DILATION N/A 06/03/2023   Procedure: SAVORY DILATION;  Surgeon: Dolores Frame, MD;  Location: [facility];  Service: Gastroenterology;  Laterality: N/A;   TUBAL LIGATION     WRIST SURGERY      OB History   No obstetric history on file.      Home Medications    Prior to Admission medications   Medication Sig Start Date End Date Taking? Authorizing Provider  diclofenac Sodium (VOLTAREN) 1 % GEL  Apply 2 g topically 4 (four) times daily. 10/24/23  Yes Meilah Delrosario K, PA-C  promethazine-dextromethorphan (PROMETHAZINE-DM) 6.25-15 MG/5ML syrup Take 5 mLs by mouth 4 (four) times daily as needed. 09/20/23   Tommie Sams, DO  Accu-Chek Softclix Lancets lancets USE TO CHECK BLOOD SUGAR DAILY BEFORE MEALS THREE TIMES DAILY 07/27/23   Adriana Simas, Verdis Frederickson, DO  ALPRAZolam Prudy Feeler) 0.5 MG tablet TAKE 1 TABLET(0.5 MG) BY MOUTH DAILY AS NEEDED FOR ANXIETY 04/06/23   Everlene Other G, DO  amLODipine (NORVASC) 5 MG tablet Take 1 tablet (5 mg total) by mouth daily.  04/19/23   Tommie Sams, DO  aspirin EC 81 MG tablet Take 81 mg by mouth daily.     [provider]  azelastine (ASTELIN) 0.1 % nasal spray Place 1 spray into both nostrils 2 (two) times daily. Use in each nostril as directed 09/12/23   Particia Nearing, PA-C  blood glucose meter kit and supplies Dispense based on patient and insurance preference. Use to test glucose twice daily  (FOR ICD-10,  E11.9). 12/03/20   Laroy Apple M, DO  docusate sodium (COLACE) 50 MG capsule Take 50 mg by mouth daily. Takes 2 capsules daily    [provider]  fluticasone (FLONASE) 50 MCG/ACT nasal spray Place 2 sprays into both nostrils daily. 08/12/23   Campbell Riches, NP  glucose blood (ACCU-CHEK GUIDE) test strip USE TO CHECK BLOOD SUGARS DAILY BEFORE MEALS 04/06/23   Cook, Jayce G, DO  levocetirizine (XYZAL) 5 MG tablet Take 1 tablet (5 mg total) by mouth every evening. 03/03/23   Tommie Sams, DO  metoprolol succinate (TOPROL-XL) 25 MG 24 hr tablet TAKE 1 TABLET(25 MG) BY MOUTH DAILY 10/01/23   Hilty, Lisette Abu, MD  mirabegron ER (MYRBETRIQ) 25 MG TB24 tablet Take 1 tablet (25 mg total) by mouth daily. 08/11/23   Campbell Riches, NP  pantoprazole (PROTONIX) 20 MG tablet Take 1 tablet (20 mg total) by mouth daily. 07/20/23   Tommie Sams, DO  polyethylene glycol (MIRALAX / GLYCOLAX) 17 g packet Take 17 g by mouth daily.    [provider]  rosuvastatin (CRESTOR) 10 MG tablet TAKE 1 TABLET(10 MG) BY MOUTH DAILY 05/26/23   Tommie Sams, DO    Family History Family History  Problem Relation Age of Onset   Diabetes Father    Heart attack Mother    Heart disease Mother    Bladder Cancer Other    Colon cancer Neg Hx     Social History Social History   Tobacco Use   Smoking status: Never    Passive exposure: Yes   Smokeless tobacco: Never  Vaping Use   Vaping status: Never Used  Substance Use Topics   Alcohol use: No    Alcohol/week: 0.0 standard drinks of alcohol    Drug use: No     Allergies   Fosamax [alendronate sodium], Vioxx [rofecoxib], Losartan, and Sulfa antibiotics   Review of Systems Review of Systems  Constitutional:  Positive for activity change. Negative for appetite change, fatigue and fever.  Eyes:  Negative for visual disturbance.  Respiratory:  Negative for shortness of breath.   Cardiovascular:  Negative for chest pain and palpitations.  Gastrointestinal:  Negative for abdominal pain, diarrhea, nausea and vomiting.  Musculoskeletal:  Positive for arthralgias. Negative for myalgias.  Skin:  Negative for color change and wound.  Neurological:  Negative for dizziness, weakness, light-headedness, numbness and headaches.     Physical  Exam Triage Vital Signs ED Triage Vitals  Encounter Vitals Group     BP 10/24/23 0917 (!) 179/97     Systolic BP Percentile --      Diastolic BP Percentile --      Pulse Rate 10/24/23 0917 70     Resp 10/24/23 0917 20     Temp 10/24/23 0917 98 F (36.7 C)     Temp Source 10/24/23 0917 Oral     SpO2 10/24/23 0917 95 %     Weight --      Height --      Head Circumference --      Peak Flow --      Pain Score 10/24/23 0919 6     Pain Loc --      Pain Education --      Exclude from Growth Chart --    No data found.  Updated Vital Signs BP (!) 182/96 Comment: pt reports has not had bp medication this am. NAD noted.  Pulse 70   Temp 98 F (36.7 C) (Oral)   Resp 20   SpO2 95%   Visual Acuity Right Eye Distance:   Left Eye Distance:   Bilateral Distance:    Right Eye Near:   Left Eye Near:    Bilateral Near:     Physical Exam Vitals reviewed.  Constitutional:      General: She is awake. She is not in acute distress.    Appearance: Normal appearance. She is well-developed. She is not ill-appearing.     Comments: Very pleasant female appears stated age in no acute distress sitting comfortably in exam room  HENT:     Head: Normocephalic and atraumatic. No raccoon eyes, Battle's  sign or contusion.     Comments: Bruising noted right forehead and right upper eyelid.    Right Ear: Tympanic membrane, ear canal and external ear normal. No hemotympanum.     Left Ear: Tympanic membrane, ear canal and external ear normal. No hemotympanum.     Mouth/Throat:     Tongue: Tongue does not deviate from midline.  Eyes:     Extraocular Movements: Extraocular movements intact.     Pupils: Pupils are equal, round, and reactive to light.  Cardiovascular:     Rate and Rhythm: Normal rate and regular rhythm.     Heart sounds: Normal heart sounds, S1 normal and S2 normal. No murmur heard. Pulmonary:     Effort: Pulmonary effort is normal.     Breath sounds: Normal breath sounds. No wheezing, rhonchi or rales.     Comments: Clear to auscultation bilaterally Musculoskeletal:     Cervical back: Normal range of motion and neck supple. Tenderness present. No bony tenderness.     Thoracic back: Tenderness present. No bony tenderness.     Lumbar back: Tenderness present. No bony tenderness.     Comments: Normal active range of motion of upper and lower extremities.    Right hand/wrist: Tender to palpation over radial wrist and into first, second, third metacarpal.  No bruising or deformity noted.  Hand is neurovascularly intact.  Decreased grip strength secondary to pain.  Skin:    Findings: No abrasion or wound.  Neurological:     General: No focal deficit present.     Mental Status: She is alert and oriented to person, place, and time.     Cranial Nerves: Cranial nerves 2-12 are intact.     Motor: Motor function is intact.  Coordination: Coordination is intact.     Gait: Gait is intact.     Comments: Cranial nerves II through XII grossly intact.  Psychiatric:        Behavior: Behavior is cooperative.      UC Treatments / Results  Labs (all labs ordered are listed, but only abnormal results are displayed) Labs Reviewed - No data to display  EKG   Radiology DG Wrist  Complete Right Result Date: 10/24/2023 CLINICAL DATA:  Fall yesterday.  Right wrist injury and pain. EXAM: RIGHT WRIST - COMPLETE 3+ VIEW COMPARISON:  None Available. FINDINGS: There is no evidence of fracture or dislocation. Advanced osteoarthritis is seen involving the base of the thumb at the 1st carpal-metacarpal joint. Osteopenia also noted. IMPRESSION: No acute findings. 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 Electronically Signed   By: Danae Orleans M.D.   On: 10/24/2023 09:55    Procedures Procedures (including critical care time)  Medications Ordered in UC Medications - No data to display  Initial Impression / Assessment and Plan / UC Course  I have reviewed the triage vital signs and the nursing notes.  Pertinent labs & imaging results that were available during my care of the patient were reviewed by me and considered in my medical decision making (see chart for details).     Patient is well-appearing, afebrile, nontoxic, nontachycardic.  Her physical exam is reassuring without focal neurological defect.  We discussed that given her age and head injury the safest thing to do is to go to the emergency room for head CT based on [place] rules, however, patient reports that she is feeling well and declined to do this as she is concerned about exposure to illness at the emergency room.  She does have family members and neighbors who can monitor her and we discussed that she should keep in close contact with them.  Recommended follow-up with her primary care tomorrow to ensure she is doing well and determine if additional testing is required.  We discussed that if she develops any kind of headache, dizziness, nausea, vomiting she must go to the ER immediately to which she expressed understanding.  Her blood pressure is elevated but she denies any signs/symptoms of endorgan damage.  She has not taken her medication today.  She was encouraged to go home and take her medicine as  soon as possible.  She is to monitor her blood pressure at home and follow-up with her primary care tomorrow.  Recommend she avoid decongestants, caffeine, sodium, NSAIDs.  If she develops any chest pain, shortness of breath, headache, vision change, dizziness in the setting of high blood pressure she is to go to the emergency room to which she expressed understanding and agreement.  She is having right wrist/hand pain.  X-rays were obtained with no acute osseous abnormality noted on final read of wrist x-ray.  At the time of discharge we were waiting for radiologist over read of right hand x-ray but I do not see any osseous abnormality.  We will contact her if radiologist over read differs and we change our treatment plan.  She was placed in a brace for comfort and support as I suspect she has sprained her wrist.  She was encouraged to use RICE protocol.  She was prescribed diclofenac topical medication to help manage pain and can use Tylenol as needed.  She does have a history of allergy to Vioxx but she reports that this was abdominal upset/nausea and vomiting so  not applicable to topical diclofenac.  We discussed that if she does have any side effects with this medication she should stop and be seen immediately.  If anything worsens and she has increasing pain, swelling, numbness or paresthesias she needs to be seen immediately.  Strict return precautions given.  All questions answered to patient satisfaction.  Final Clinical Impressions(s) / UC Diagnoses   Final diagnoses:  Right hand pain  Fall, initial encounter  Sprain and strain of right wrist  Elevated blood pressure reading with diagnosis of hypertension     Discharge Instructions      As we discussed, the safest thing to do is to go to the emergency room for head CT since you hit your head.  Since we are not doing that, I want you to follow-up with your primary care tomorrow.  If anything changes and you have headache, dizziness, nausea,  vomiting, vision change you need to go to the ER immediately.  Please stay in contact with your neighbors and family and have them check on you regularly.  Your x-rays did not show any evidence of broken bone.  I suspect you have injured the soft tissue (ligaments and tendons) in your wrist.  Keep this elevated and use the brace for comfort and support.  Apply diclofenac gel up to 4 times a day to help with pain.  You can also take Tylenol.  Follow-up with your primary care.  If anything worsens or changes please return for reevaluation.  Your blood pressure is elevated.  Please go home and take your medication.  Monitor your blood pressure at home and follow-up with your primary care tomorrow.  If you have any chest pain, shortness of breath, headache, vision change, dizziness in the setting of high blood pressure you need to go to the emergency room immediately.     ED Prescriptions     Medication Sig Dispense Auth. Provider   diclofenac Sodium (VOLTAREN) 1 % GEL Apply 2 g topically 4 (four) times daily. 50 g Aarnav Steagall K, PA-C      PDMP not reviewed this encounter.   Jeani Hawking, PA-C 10/24/23 1019

## 2023-10-25 ENCOUNTER — Ambulatory Visit (INDEPENDENT_AMBULATORY_CARE_PROVIDER_SITE_OTHER): Payer: Medicare Other | Admitting: Family Medicine

## 2023-10-25 ENCOUNTER — Encounter: Payer: Self-pay | Admitting: Family Medicine

## 2023-10-25 VITALS — BP 150/82 | HR 63 | Temp 98.4°F | Ht 63.0 in | Wt 219.0 lb

## 2023-10-25 DIAGNOSIS — I10 Essential (primary) hypertension: Secondary | ICD-10-CM | POA: Diagnosis not present

## 2023-10-25 DIAGNOSIS — S0990XD Unspecified injury of head, subsequent encounter: Secondary | ICD-10-CM | POA: Diagnosis not present

## 2023-10-25 DIAGNOSIS — S0990XA Unspecified injury of head, initial encounter: Secondary | ICD-10-CM | POA: Insufficient documentation

## 2023-10-25 MED ORDER — AMLODIPINE BESYLATE 10 MG PO TABS: 10.0000 mg | ORAL_TABLET | Freq: Every day | ORAL | 3 refills | Status: DC

## 2023-10-25 NOTE — Patient Instructions (Signed)
Rest.  I increased the amlodipine.  Follow up in 1 month.

## 2023-10-25 NOTE — Assessment & Plan Note (Signed)
Uncontrolled/BP worsening; exacerbation >> Increasing Norvasc.

## 2023-10-25 NOTE — Assessment & Plan Note (Signed)
Benign exam. No need for imaging at this time. Supportive care.

## 2023-10-25 NOTE — Progress Notes (Signed)
Subjective:  Patient ID: Jessica Kirby, female    DOB: 1956-08-18  Age: 68 y.o. MRN: [id number]  CC:   Chief Complaint  Patient presents with   Follow-up    Seen at urgent care due to fall. Glasses pushed into eye area and right hand is sore.     HPI:  68 year old female presents for evaluation of the above.  Recent fall on payment on Saturday. Slipped and fell forward. Suffered abrasions to the knees and injured right wrist and right periorbital region. Went to Urgent care for evaluation.  Had reassuring exam. Xray of the wrist was negative for fracture. CT head was recommended but patient declined.   Patient presents today for follow up. Has some pain and bruising of the right eye/eyebrow. Mild wrist pain (has brace on).   Patient Active Problem List   Diagnosis Date Noted   Minor head injury 10/25/2023   Overactive bladder 08/11/2023   Proteinuria 12/09/2022   Chronic idiopathic constipation 10/15/2022   Hyperlipidemia 03/25/2022   Diabetes mellitus without complication (HCC) 11/28/2020   Anxiety 11/04/2015   GERD (gastroesophageal reflux disease) 09/27/2013   Hypertension    Osteopenia     Social Hx   Social History   Socioeconomic History   Marital status: Widowed    Spouse name: Not on file   Number of children: Not on file   Years of education: Not on file   Highest education level: Not on file  Occupational History   Not on file  Tobacco Use   Smoking status: Never    Passive exposure: Yes   Smokeless tobacco: Never  Vaping Use   Vaping status: Never Used  Substance and Sexual Activity   Alcohol use: No    Alcohol/week: 0.0 standard drinks of alcohol   Drug use: No   Sexual activity: Yes  Other Topics Concern   Not on file  Social History Narrative   Not on file   Social Drivers of Health   Financial Resource Strain: Low Risk  (05/14/2023)   Overall Financial Resource Strain (CARDIA)    Difficulty of Paying Living Expenses: Not hard at all   Food Insecurity: No Food Insecurity (05/14/2023)   Hunger Vital Sign    Worried About Running Out of Food in the Last Year: Never true    Ran Out of Food in the Last Year: Never true  Transportation Needs: No Transportation Needs (05/14/2023)   PRAPARE - Administrator, Civil Service (Medical): No    Lack of Transportation (Non-Medical): No  Physical Activity: Sufficiently Active (05/14/2023)   Exercise Vital Sign    Days of Exercise per Week: 7 days    Minutes of Exercise per Session: 30 min  Stress: No Stress Concern Present (05/14/2023)   [employer] of Occupational Health - Occupational Stress Questionnaire    Feeling of Stress : Not at all  Social Connections: Moderately Integrated (05/14/2023)   Social Connection and Isolation Panel [NHANES]    Frequency of Communication with Friends and Family: More than three times a week    Frequency of Social Gatherings with Friends and Family: More than three times a week    Attends Religious Services: More than 4 times per year    Active Member of [employer] or Organizations: Yes    Attends Banker Meetings: More than 4 times per year    Marital Status: Widowed    Review of Systems Per HPI  Objective:  BP Marland Kitchen)  180/97   Pulse 63   Temp 98.4 F (36.9 C)   Ht 5\' 3"  (1.6 m)   Wt 219 lb (99.3 kg)   SpO2 100%   BMI 38.79 kg/m      10/25/2023    3:36 PM 10/24/2023    9:58 AM 10/24/2023    9:17 AM  BP/Weight  Systolic BP 180 182 179  Diastolic BP 97 96 97  Wt. (Lbs) 219    BMI 38.79 kg/m2      Physical Exam Vitals and nursing note reviewed.  Constitutional:      General: She is not in acute distress. HENT:     Head:     Comments: Bruising of the right upper eyelid. Mild tenderness of the supraorbital region. Eyes:     Conjunctiva/sclera: Conjunctivae normal.  Cardiovascular:     Rate and Rhythm: Normal rate and regular rhythm.  Pulmonary:     Effort: Pulmonary effort is normal.     Breath sounds:  Normal breath sounds. No wheezing or rales.  Neurological:     Mental Status: She is alert.     Lab Results  Component Value Date   WBC 10.6 07/20/2023   HGB 14.2 07/20/2023   HCT 43.2 07/20/2023   PLT 284 07/20/2023   GLUCOSE 86 07/20/2023   CHOL 158 07/20/2023   TRIG 148 07/20/2023   HDL 48 07/20/2023   LDLCALC 84 07/20/2023   ALT 18 07/20/2023   AST 19 07/20/2023   NA 144 07/20/2023   K 4.3 07/20/2023   CL 104 07/20/2023   CREATININE 0.74 07/20/2023   BUN 8 07/20/2023   CO2 24 07/20/2023   TSH 2.820 06/10/2022   INR 0.9 02/03/2022   HGBA1C 6.2 (H) 07/20/2023     Assessment & Plan:   Problem List Items Addressed This Visit       Cardiovascular and Mediastinum   Hypertension   Uncontrolled/BP worsening; exacerbation >> Increasing Norvasc.      Relevant Medications   amLODipine (NORVASC) 10 MG tablet     Other   Minor head injury - Primary   Benign exam. No need for imaging at this time. Supportive care.       Meds ordered this encounter  Medications   amLODipine (NORVASC) 10 MG tablet    Sig: Take 1 tablet (10 mg total) by mouth daily.    Dispense:  90 tablet    Refill:  3    Follow-up:  Return in about 1 month (around 11/25/2023) for HTN follow up.  Everlene Other DO [facility]

## 2023-11-25 ENCOUNTER — Ambulatory Visit (INDEPENDENT_AMBULATORY_CARE_PROVIDER_SITE_OTHER): Payer: Medicare Other | Admitting: Family Medicine

## 2023-11-25 VITALS — BP 132/81 | HR 81 | Temp 97.7°F | Ht 63.0 in | Wt 218.0 lb

## 2023-11-25 DIAGNOSIS — I10 Essential (primary) hypertension: Secondary | ICD-10-CM

## 2023-11-25 MED ORDER — METOPROLOL SUCCINATE ER 25 MG PO TB24: 25.0000 mg | ORAL_TABLET | Freq: Every day | ORAL | 3 refills | Status: DC

## 2023-11-25 NOTE — Patient Instructions (Signed)
 Medication as prescribed.  See you in April.

## 2023-11-28 NOTE — Assessment & Plan Note (Signed)
 Stable.  Continue metoprolol and amlodipine.  Metoprolol refilled.

## 2023-11-28 NOTE — Progress Notes (Signed)
 Subjective:  Patient ID: Jessica Kirby, female    DOB: 1956-04-04  Age: 68 y.o. MRN: [id number]  CC:   Chief Complaint  Patient presents with   Blood Pressure Check    HPI:  68 year old female presents for follow-up regarding hypertension.  BP now at goal.  Patient is compliant with metoprolol and amlodipine.  Patient states that she needs refill on metoprolol.   Patient Active Problem List   Diagnosis Date Noted   Minor head injury 10/25/2023   Overactive bladder 08/11/2023   Proteinuria 12/09/2022   Chronic idiopathic constipation 10/15/2022   Hyperlipidemia 03/25/2022   Diabetes mellitus without complication (HCC) 11/28/2020   Anxiety 11/04/2015   GERD (gastroesophageal reflux disease) 09/27/2013   Hypertension    Osteopenia     Social Hx   Social History   Socioeconomic History   Marital status: Widowed    Spouse name: Not on file   Number of children: Not on file   Years of education: Not on file   Highest education level: Not on file  Occupational History   Not on file  Tobacco Use   Smoking status: Never    Passive exposure: Yes   Smokeless tobacco: Never  Vaping Use   Vaping status: Never Used  Substance and Sexual Activity   Alcohol use: No    Alcohol/week: 0.0 standard drinks of alcohol   Drug use: No   Sexual activity: Yes  Other Topics Concern   Not on file  Social History Narrative   Not on file   Social Drivers of Health   Financial Resource Strain: Low Risk  (05/14/2023)   Overall Financial Resource Strain (CARDIA)    Difficulty of Paying Living Expenses: Not hard at all  Food Insecurity: No Food Insecurity (05/14/2023)   Hunger Vital Sign    Worried About Running Out of Food in the Last Year: Never true    Ran Out of Food in the Last Year: Never true  Transportation Needs: No Transportation Needs (05/14/2023)   PRAPARE - Administrator, Civil Service (Medical): No    Lack of Transportation (Non-Medical): No  Physical  Activity: Sufficiently Active (05/14/2023)   Exercise Vital Sign    Days of Exercise per Week: 7 days    Minutes of Exercise per Session: 30 min  Stress: No Stress Concern Present (05/14/2023)   [employer] of Occupational Health - Occupational Stress Questionnaire    Feeling of Stress : Not at all  Social Connections: Moderately Integrated (05/14/2023)   Social Connection and Isolation Panel [NHANES]    Frequency of Communication with Friends and Family: More than three times a week    Frequency of Social Gatherings with Friends and Family: More than three times a week    Attends Religious Services: More than 4 times per year    Active Member of [employer] or Organizations: Yes    Attends Banker Meetings: More than 4 times per year    Marital Status: Widowed    Review of Systems  Constitutional: Negative.     Objective:  BP 132/81   Pulse 81   Temp 97.7 F (36.5 C)   Ht 5\' 3"  (1.6 m)   Wt 218 lb (98.9 kg)   SpO2 95%   BMI 38.62 kg/m      11/25/2023    2:15 PM 11/25/2023    2:14 PM 10/26/2023    8:48 AM  BP/Weight  Systolic BP 132 152 150  Diastolic  BP 81 82 82  Wt. (Lbs)  218   BMI  38.62 kg/m2     Physical Exam Vitals and nursing note reviewed.  Constitutional:      General: She is not in acute distress.    Appearance: Normal appearance.  HENT:     Head: Normocephalic and atraumatic.  Eyes:     General:        Right eye: No discharge.        Left eye: No discharge.     Conjunctiva/sclera: Conjunctivae normal.  Cardiovascular:     Rate and Rhythm: Normal rate and regular rhythm.  Pulmonary:     Effort: Pulmonary effort is normal.     Breath sounds: Normal breath sounds. No wheezing, rhonchi or rales.  Neurological:     Mental Status: She is alert.  Psychiatric:        Mood and Affect: Mood normal.        Behavior: Behavior normal.     Lab Results  Component Value Date   WBC 10.6 07/20/2023   HGB 14.2 07/20/2023   HCT 43.2 07/20/2023    PLT 284 07/20/2023   GLUCOSE 86 07/20/2023   CHOL 158 07/20/2023   TRIG 148 07/20/2023   HDL 48 07/20/2023   LDLCALC 84 07/20/2023   ALT 18 07/20/2023   AST 19 07/20/2023   NA 144 07/20/2023   K 4.3 07/20/2023   CL 104 07/20/2023   CREATININE 0.74 07/20/2023   BUN 8 07/20/2023   CO2 24 07/20/2023   TSH 2.820 06/10/2022   INR 0.9 02/03/2022   HGBA1C 6.2 (H) 07/20/2023     Assessment & Plan:  Primary hypertension Assessment & Plan: Stable.  Continue metoprolol and amlodipine.  Metoprolol refilled.   Other orders -     Metoprolol Succinate ER; Take 1 tablet (25 mg total) by mouth daily.  Dispense: 90 tablet; Refill: 3    Follow-up:  Has follow up already scheduled  Everlene Other DO [facility]

## 2023-11-29 DIAGNOSIS — M79675 Pain in left toe(s): Secondary | ICD-10-CM | POA: Diagnosis not present

## 2023-11-29 DIAGNOSIS — E1140 Type 2 diabetes mellitus with diabetic neuropathy, unspecified: Secondary | ICD-10-CM | POA: Diagnosis not present

## 2023-11-29 DIAGNOSIS — M79674 Pain in right toe(s): Secondary | ICD-10-CM | POA: Diagnosis not present

## 2023-11-29 DIAGNOSIS — M79671 Pain in right foot: Secondary | ICD-10-CM | POA: Diagnosis not present

## 2023-11-29 DIAGNOSIS — M79672 Pain in left foot: Secondary | ICD-10-CM | POA: Diagnosis not present

## 2023-11-29 DIAGNOSIS — L110 Acquired keratosis follicularis: Secondary | ICD-10-CM | POA: Diagnosis not present

## 2023-12-07 ENCOUNTER — Ambulatory Visit: Payer: Medicare Other | Admitting: Nurse Practitioner

## 2023-12-31 ENCOUNTER — Telehealth: Payer: Self-pay

## 2023-12-31 ENCOUNTER — Ambulatory Visit: Payer: Self-pay

## 2023-12-31 NOTE — Telephone Encounter (Signed)
 LMTCB

## 2023-12-31 NOTE — Telephone Encounter (Signed)
 Chief Complaint: Weakness/Fatigue Symptoms: muscle weakness, constipation Frequency: Began today Pertinent Negatives: Patient denies fever,CP, SOB, N/V/D Disposition: [] ED /[] Urgent Care (no appt availability in office) / [x] Appointment(In office/virtual)/ []  [place] [facility]/ [] Home Care/ [x] Refused Recommended Disposition /[] [place] [facility]/ []  Follow-up with PCP Additional Notes: Pt reports she began with mild weakness and fatigue today, she notes she believes her potassium may be low. Denies CP, SOB, fever, vision changes. OV offered for Monday 04/07, pt declines. Pt requests labs ordered so she can go have labs drawn. This RN educated pt on home care, new-worsening symptoms, when to call back/seek emergent care. Pt verbalized understanding and agrees to plan.     Copied From CRM [id number]. Reason for Triage: feeling weak and tired, worried potassium might be low- asking if there is something over the counter she can take- would like a call back [phone]    Reason for Disposition  [1] MILD weakness (i.e., does not interfere with ability to work, go to school, normal activities) AND [2] persists > 1 week  Answer Assessment - Initial Assessment Questions 1. DESCRIPTION: "Describe how you are feeling."     Weak and lack of energy 2. SEVERITY: "How bad is it?"  "Can you stand and walk?"   - MILD (0-3): Feels weak or tired, but does not interfere with work, school or normal activities.   - MODERATE (4-7): Able to stand and walk; weakness interferes with work, school, or normal activities.   - SEVERE (8-10): Unable to stand or walk; unable to do usual activities.     Mild 3. ONSET: "When did these symptoms begin?" (e.g., hours, days, weeks, months)     Today 4. CAUSE: "What do you think is causing the weakness or fatigue?" (e.g., not drinking enough fluids, medical problem, trouble sleeping)     Pt believes her Potassium may be low 5. NEW MEDICINES:  "Have you started on  any new medicines recently?" (e.g., opioid pain medicines, benzodiazepines, muscle relaxants, antidepressants, antihistamines, neuroleptics, beta blockers)     None 6. OTHER SYMPTOMS: "Do you have any other symptoms?" (e.g., chest pain, fever, cough, SOB, vomiting, diarrhea, bleeding, other areas of pain)     None  Protocols used: Weakness (Generalized) and Fatigue-A-AH

## 2024-01-02 ENCOUNTER — Encounter (HOSPITAL_COMMUNITY): Payer: Self-pay

## 2024-01-02 ENCOUNTER — Emergency Department (HOSPITAL_COMMUNITY)
Admission: EM | Admit: 2024-01-02 | Discharge: 2024-01-02 | Disposition: A | Discharge status: Home / Self Care | Attending: Emergency Medicine | Admitting: Emergency Medicine

## 2024-01-02 ENCOUNTER — Other Ambulatory Visit: Payer: Self-pay

## 2024-01-02 ENCOUNTER — Emergency Department (HOSPITAL_COMMUNITY)

## 2024-01-02 DIAGNOSIS — M7989 Other specified soft tissue disorders: Secondary | ICD-10-CM | POA: Diagnosis not present

## 2024-01-02 DIAGNOSIS — R531 Weakness: Secondary | ICD-10-CM | POA: Diagnosis not present

## 2024-01-02 DIAGNOSIS — R0602 Shortness of breath: Secondary | ICD-10-CM | POA: Insufficient documentation

## 2024-01-02 DIAGNOSIS — Z7982 Long term (current) use of aspirin: Secondary | ICD-10-CM | POA: Insufficient documentation

## 2024-01-02 DIAGNOSIS — R791 Abnormal coagulation profile: Secondary | ICD-10-CM | POA: Diagnosis not present

## 2024-01-02 DIAGNOSIS — E119 Type 2 diabetes mellitus without complications: Secondary | ICD-10-CM | POA: Diagnosis not present

## 2024-01-02 DIAGNOSIS — I10 Essential (primary) hypertension: Secondary | ICD-10-CM | POA: Insufficient documentation

## 2024-01-02 DIAGNOSIS — I77810 Thoracic aortic ectasia: Secondary | ICD-10-CM | POA: Diagnosis not present

## 2024-01-02 DIAGNOSIS — Z79899 Other long term (current) drug therapy: Secondary | ICD-10-CM | POA: Insufficient documentation

## 2024-01-02 DIAGNOSIS — R0989 Other specified symptoms and signs involving the circulatory and respiratory systems: Secondary | ICD-10-CM | POA: Diagnosis not present

## 2024-01-02 LAB — CBC
HCT: 39.8 % (ref 36.0–46.0)
Hemoglobin: 12.9 g/dL (ref 12.0–15.0)
MCH: 28.4 pg (ref 26.0–34.0)
MCHC: 32.4 g/dL (ref 30.0–36.0)
MCV: 87.7 fL (ref 80.0–100.0)
Platelets: 267 10*3/uL (ref 150–400)
RBC: 4.54 MIL/uL (ref 3.87–5.11)
RDW: 12.1 % (ref 11.5–15.5)
WBC: 9.4 10*3/uL (ref 4.0–10.5)
nRBC: 0 % (ref 0.0–0.2)

## 2024-01-02 LAB — COMPREHENSIVE METABOLIC PANEL WITH GFR
ALT: 22 U/L (ref 0–44)
AST: 21 U/L (ref 15–41)
Albumin: 3.7 g/dL (ref 3.5–5.0)
Alkaline Phosphatase: 69 U/L (ref 38–126)
Anion gap: 10 (ref 5–15)
BUN: 12 mg/dL (ref 8–23)
CO2: 24 mmol/L (ref 22–32)
Calcium: 9.1 mg/dL (ref 8.9–10.3)
Chloride: 105 mmol/L (ref 98–111)
Creatinine, Ser: 0.62 mg/dL (ref 0.44–1.00)
GFR, Estimated: >60 mL/min (ref >60)
Glucose, Bld: 120 mg/dL — ABNORMAL HIGH (ref 70–99)
Potassium: 3.4 mmol/L — ABNORMAL LOW (ref 3.5–5.1)
Sodium: 139 mmol/L (ref 135–145)
Total Bilirubin: 0.7 mg/dL (ref 0.0–1.2)
Total Protein: 7 g/dL (ref 6.5–8.1)

## 2024-01-02 LAB — URINALYSIS, ROUTINE W REFLEX MICROSCOPIC
Bilirubin Urine: NEGATIVE
Glucose, UA: NEGATIVE mg/dL
Hgb urine dipstick: NEGATIVE
Ketones, ur: NEGATIVE mg/dL
Leukocytes,Ua: NEGATIVE
Nitrite: NEGATIVE
Protein, ur: NEGATIVE mg/dL
Specific Gravity, Urine: 1.004 — ABNORMAL LOW (ref 1.005–1.030)
pH: 7 (ref 5.0–8.0)

## 2024-01-02 LAB — BRAIN NATRIURETIC PEPTIDE: B Natriuretic Peptide: 28 pg/mL (ref 0.0–100.0)

## 2024-01-02 LAB — TROPONIN I (HIGH SENSITIVITY)
Troponin I (High Sensitivity): 4 ng/L (ref <18)
Troponin I (High Sensitivity): 3 ng/L (ref <18)

## 2024-01-02 LAB — D-DIMER, QUANTITATIVE: D-Dimer, Quant: 0.8 ug{FEU}/mL — ABNORMAL HIGH (ref 0.00–0.50)

## 2024-01-02 MED ORDER — POTASSIUM CHLORIDE CRYS ER 20 MEQ PO TBCR
20.0000 meq | EXTENDED_RELEASE_TABLET | Freq: Once | ORAL | Status: AC
  Administered 2024-01-02: 20 meq via ORAL
  Filled 2024-01-02: qty 1

## 2024-01-02 MED ORDER — IOHEXOL 350 MG/ML SOLN
75.0000 mL | Freq: Once | INTRAVENOUS | Status: AC | PRN
  Administered 2024-01-02: 75 mL via INTRAVENOUS

## 2024-01-02 NOTE — ED Provider Notes (Signed)
 [place] EMERGENCY DEPARTMENT AT [facility] Provider Note   CSN: [id number] Arrival date & time: 01/02/24  1120     History {Add pertinent medical, surgical, social history, OB history to HPI:1} Chief Complaint  Patient presents with   Weakness    Jessica Kirby is a 68 y.o. female.   Weakness Associated symptoms: shortness of breath   Associated symptoms: no abdominal pain, no arthralgias, no chest pain, no diarrhea, no dizziness, no dysuria, no fever, no headaches, no myalgias, no nausea and no vomiting        Jessica Kirby is a 68 y.o. female with past medical history of hypertension, GERD, anxiety, type 2 diabetes, who presents to the Emergency Department complaining of generalized weakness and mild shortness of breath.  Her symptoms began 2 days ago.  She noticed swelling of her ankles and feet bilaterally.  States she has had a history of low potassium several times in the past and states her current symptoms feel similar.  Does not take any diuretics at this time.  She denies having any cough, fever or chills, or history of CHF.  Denies any chest pain or dizziness.  No syncope   Home Medications Prior to Admission medications   Medication Sig Start Date End Date Taking? Authorizing Provider  Accu-Chek Softclix Lancets lancets USE TO CHECK BLOOD SUGAR DAILY BEFORE MEALS THREE TIMES DAILY 07/27/23   Adriana Simas, Verdis Frederickson, DO  ALPRAZolam (XANAX) 0.5 MG tablet TAKE 1 TABLET(0.5 MG) BY MOUTH DAILY AS NEEDED FOR ANXIETY 04/06/23   Cook, Jayce G, DO  amLODipine (NORVASC) 10 MG tablet Take 1 tablet (10 mg total) by mouth daily. 10/25/23   Tommie Sams, DO  aspirin EC 81 MG tablet Take 81 mg by mouth daily.     [provider]  blood glucose meter kit and supplies Dispense based on patient and insurance preference. Use to test glucose twice daily  (FOR ICD-10,  E11.9). 12/03/20   Laroy Apple M, DO  diclofenac Sodium (VOLTAREN) 1 % GEL Apply 2 g topically 4 (four)  times daily. 10/24/23   Raspet, Noberto Retort, PA-C  docusate sodium (COLACE) 50 MG capsule Take 50 mg by mouth daily. Takes 2 capsules daily    [provider]  fluticasone (FLONASE) 50 MCG/ACT nasal spray Place 2 sprays into both nostrils daily. 08/12/23   Campbell Riches, NP  glucose blood (ACCU-CHEK GUIDE) test strip USE TO CHECK BLOOD SUGARS DAILY BEFORE MEALS 04/06/23   Cook, Jayce G, DO  levocetirizine (XYZAL) 5 MG tablet Take 1 tablet (5 mg total) by mouth every evening. 03/03/23   Tommie Sams, DO  metoprolol succinate (TOPROL-XL) 25 MG 24 hr tablet Take 1 tablet (25 mg total) by mouth daily. 11/25/23   Tommie Sams, DO  mirabegron ER (MYRBETRIQ) 25 MG TB24 tablet Take 1 tablet (25 mg total) by mouth daily. 08/11/23   Campbell Riches, NP  pantoprazole (PROTONIX) 20 MG tablet Take 1 tablet (20 mg total) by mouth daily. 07/20/23   Tommie Sams, DO  polyethylene glycol (MIRALAX / GLYCOLAX) 17 g packet Take 17 g by mouth daily.    [provider]  rosuvastatin (CRESTOR) 10 MG tablet TAKE 1 TABLET(10 MG) BY MOUTH DAILY 05/26/23   Tommie Sams, DO      Allergies    Fosamax [alendronate sodium], Vioxx [rofecoxib], Losartan, and Sulfa antibiotics    Review of Systems   Review of Systems  Constitutional:  Negative for appetite change, chills and fever.  Eyes:  Negative for visual disturbance.  Respiratory:  Positive for shortness of breath.   Cardiovascular:  Positive for leg swelling. Negative for chest pain.  Gastrointestinal:  Negative for abdominal pain, diarrhea, nausea and vomiting.  Genitourinary:  Negative for dysuria and flank pain.  Musculoskeletal:  Negative for arthralgias and myalgias.  Neurological:  Positive for weakness. Negative for dizziness, syncope, numbness and headaches.  Psychiatric/Behavioral:  Negative for confusion.     Physical Exam Updated Vital Signs BP (!) 148/79 (BP Location: Right Arm)   Pulse 87   Temp 98.4 F (36.9 C) (Oral)   Resp 18    Ht 5' 2.5" (1.588 m)   Wt 97.5 kg   SpO2 94%   BMI 38.70 kg/m  Physical Exam Vitals and nursing note reviewed.  Constitutional:      General: She is not in acute distress.    Appearance: Normal appearance. She is not ill-appearing or toxic-appearing.  HENT:     Mouth/Throat:     Mouth: Mucous membranes are moist.  Cardiovascular:     Rate and Rhythm: Normal rate and regular rhythm.     Pulses: Normal pulses.  Pulmonary:     Effort: Pulmonary effort is normal. No respiratory distress.     Breath sounds: Normal breath sounds.  Chest:     Chest wall: No tenderness.  Abdominal:     Palpations: Abdomen is soft.     Tenderness: There is no abdominal tenderness.  Musculoskeletal:        General: No tenderness.     Comments: No pitting edema of the bilateral lower extremities.  No excessive warmth or erythema no calf tenderness  Skin:    General: Skin is warm.     Capillary Refill: Capillary refill takes less than 2 seconds.  Neurological:     General: No focal deficit present.     Mental Status: She is alert.     Sensory: No sensory deficit.     Motor: No weakness.     ED Results / Procedures / Treatments   Labs (all labs ordered are listed, but only abnormal results are displayed) Labs Reviewed  COMPREHENSIVE METABOLIC PANEL WITH GFR - Abnormal; Notable for the following components:      Result Value   Potassium 3.4 (*)    Glucose, Bld 120 (*)    All other components within normal limits  URINALYSIS, ROUTINE W REFLEX MICROSCOPIC - Abnormal; Notable for the following components:   Color, Urine STRAW (*)    Specific Gravity, Urine 1.004 (*)    All other components within normal limits  CBC  BRAIN NATRIURETIC PEPTIDE  TROPONIN I (HIGH SENSITIVITY)    EKG EKG Interpretation Date/Time:  Sunday January 02 2024 13:03:23 EDT Ventricular Rate:  75 PR Interval:  198 QRS Duration:  97 QT Interval:  390 QTC Calculation: 436 R Axis:   26  Text Interpretation: Sinus  rhythm Low voltage, precordial leads Borderline repolarization abnormality Borderline ST elevation, lateral leads unchanged from 5/24 Confirmed by Eber Hong ([id number]) on 01/02/2024 2:08:26 PM  Radiology DG Chest Portable 1 View Result Date: 01/02/2024 CLINICAL DATA:  Weakness. EXAM: PORTABLE CHEST 1 VIEW COMPARISON:  02/21/2023 FINDINGS: Low lung volumes. The cardio pericardial silhouette is enlarged. The lungs are clear without focal pneumonia, edema, pneumothorax or pleural effusion. No acute bony abnormality. Telemetry leads overlie the chest. IMPRESSION: Low volume film without acute cardiopulmonary findings. Electronically Signed   By: Minerva Areola  Molli Posey M.D.   On: 01/02/2024 13:19    Procedures Procedures  {Document cardiac monitor, telemetry assessment procedure when appropriate:1}  Medications Ordered in ED Medications - No data to display  ED Course/ Medical Decision Making/ A&P   {   Click here for ABCD2, HEART and other calculatorsREFRESH Note before signing :1}                              Medical Decision Making Patient here with complaint of generalized weakness and shortness of breath.  No chest pain recent illness or cough.  No history of PE or DVT.  She endorses swelling to the bilateral lower extremities.  No unilateral swelling or calf tenderness.  Has history of hypokalemia and current symptoms feel similar to previous.  Has upcoming appointment for blood work ordered by PCP, but she was concerned about her potassium level being too low and felt she needed emergent evaluation   Patient is well-appearing on exam, she is ambulatory in the department with steady gait.  Her vital signs are reassuring no tachycardia tachypnea or hypoxia.  She does describe having some shortness of breath her lungs are clear to auscultation on exam  Electrolyte abnormality, viral versus bacterial process, ACS, PE, DVT considered    Amount and/or Complexity of Data Reviewed Labs: ordered.     Details: Labs interpreted by me, no evidence of leukocytosis, chemistries without significant derangement.  Potassium today is 3.4.  BNP is reassuring.  Urinalysis without evidence of infection.  Troponin remains flat, D-dimer is elevated at 0.8. Radiology: ordered.    Details: Chest x-ray without evidence of effusion or opacity.  CT angio of chest was ordered, D-dimer level above age-adjusted rule out ECG/medicine tests: ordered.    Details: EKG shows sinus rhythm low voltage in precordial leads.  Borderline ST elevation Discussion of management or test interpretation with external provider(s): On recheck, patient resting comfortably.  She is ambulatory in the department with steady gait.  Her workup today is overall reassuring.  She does have upcoming PCP appointment and upcoming order for blood work.  Her potassium is not significantly low today, but she reports having similar symptoms in the past with her potassium being low.  Does not appear to take any diuretics.  Will give potassium scription for couple of days.  I have recommended that she contact her PCPs office so that potassium may be rechecked within 1 week.  Doubt emergent process, no concern for ACS, PE or DVT.  Risk Prescription drug management.     {Document critical care time when appropriate:1} {Document review of labs and clinical decision tools ie heart score, Chads2Vasc2 etc:1}  {Document your independent review of radiology images, and any outside records:1} {Document your discussion with family members, caretakers, and with consultants:1} {Document social determinants of health affecting pt's care:1} {Document your decision making why or why not admission, treatments were needed:1} Final Clinical Impression(s) / ED Diagnoses Final diagnoses:  None    Rx / DC Orders ED Discharge Orders     None

## 2024-01-02 NOTE — ED Triage Notes (Signed)
 Pt stated that she began feeling weak and slightly SOB since Friday. Pt also noticed her ankles are slightly swollen. Pt was trying to wait for her blood work appointment, but didn't want to push it.

## 2024-01-02 NOTE — Discharge Instructions (Signed)
 Your potassium today was only mildly low.  Your workup was overall reassuring.  I recommend that you call Dr. Patsey Berthold office tomorrow to arrange follow-up appointment for later this week to have your potassium level rechecked.  Turn to the emergency department if you develop any new or worsening symptoms

## 2024-01-03 ENCOUNTER — Encounter: Payer: Self-pay | Admitting: Family Medicine

## 2024-01-03 ENCOUNTER — Ambulatory Visit: Admitting: Family Medicine

## 2024-01-03 VITALS — BP 145/84 | HR 71 | Temp 97.5°F | Ht 62.5 in | Wt 216.0 lb

## 2024-01-03 DIAGNOSIS — E876 Hypokalemia: Secondary | ICD-10-CM | POA: Insufficient documentation

## 2024-01-03 DIAGNOSIS — E119 Type 2 diabetes mellitus without complications: Secondary | ICD-10-CM | POA: Diagnosis not present

## 2024-01-03 DIAGNOSIS — E785 Hyperlipidemia, unspecified: Secondary | ICD-10-CM

## 2024-01-03 DIAGNOSIS — E1169 Type 2 diabetes mellitus with other specified complication: Secondary | ICD-10-CM

## 2024-01-03 MED ORDER — POTASSIUM CHLORIDE CRYS ER 20 MEQ PO TBCR: 20.0000 meq | EXTENDED_RELEASE_TABLET | Freq: Every day | ORAL | 0 refills | Status: DC

## 2024-01-03 NOTE — Assessment & Plan Note (Signed)
 Etiology unclear.  Further workup today with additional labs.  Potassium supplementation sent in.

## 2024-01-03 NOTE — Patient Instructions (Signed)
 Labs ordered.  Start Potassium.  Follow up in 1-2 weeks.

## 2024-01-03 NOTE — Progress Notes (Signed)
 Subjective:  Patient ID: Jessica Kirby, female    DOB: 1956/08/03  Age: 68 y.o. MRN: [id number]  CC:   Chief Complaint  Patient presents with   Follow-up    ED follow up, low K+ Leg cramps    HPI:  68 year old female presents for evaluation of the above.  Presented to the ED yesterday with generalized weakness and SOB.  Patient states that her symptoms started on Friday.  Workup in the ED revealed mild hypokalemia of 3.4.  D-dimer was elevated as well but CTA was negative.  Patient was discharged home in stable condition.  Patient states that she continues to feel weak and generally fatigued.  Denies chest pain or shortness of breath at this time.  She does report that she has had some leg cramps.  She has had previous hypokalemia in the setting of diuretic use.  She is on no medications currently without recall of hypokalemia.  No symptoms that would cause and GI losses.   Patient Active Problem List   Diagnosis Date Noted   Hypokalemia 01/03/2024   Overactive bladder 08/11/2023   Proteinuria 12/09/2022   Chronic idiopathic constipation 10/15/2022   Hyperlipidemia 03/25/2022   Diabetes mellitus without complication (HCC) 11/28/2020   Anxiety 11/04/2015   GERD (gastroesophageal reflux disease) 09/27/2013   Hypertension    Osteopenia     Social Hx   Social History   Socioeconomic History   Marital status: Widowed    Spouse name: Not on file   Number of children: Not on file   Years of education: Not on file   Highest education level: Not on file  Occupational History   Not on file  Tobacco Use   Smoking status: Never    Passive exposure: Yes   Smokeless tobacco: Never  Vaping Use   Vaping status: Never Used  Substance and Sexual Activity   Alcohol use: No    Alcohol/week: 0.0 standard drinks of alcohol   Drug use: No   Sexual activity: Yes  Other Topics Concern   Not on file  Social History Narrative   Not on file   Social Drivers of Health   Financial  Resource Strain: Low Risk  (05/14/2023)   Overall Financial Resource Strain (CARDIA)    Difficulty of Paying Living Expenses: Not hard at all  Food Insecurity: No Food Insecurity (05/14/2023)   Hunger Vital Sign    Worried About Running Out of Food in the Last Year: Never true    Ran Out of Food in the Last Year: Never true  Transportation Needs: No Transportation Needs (05/14/2023)   PRAPARE - Administrator, Civil Service (Medical): No    Lack of Transportation (Non-Medical): No  Physical Activity: Sufficiently Active (05/14/2023)   Exercise Vital Sign    Days of Exercise per Week: 7 days    Minutes of Exercise per Session: 30 min  Stress: No Stress Concern Present (05/14/2023)   [employer] of Occupational Health - Occupational Stress Questionnaire    Feeling of Stress : Not at all  Social Connections: Moderately Integrated (05/14/2023)   Social Connection and Isolation Panel [NHANES]    Frequency of Communication with Friends and Family: More than three times a week    Frequency of Social Gatherings with Friends and Family: More than three times a week    Attends Religious Services: More than 4 times per year    Active Member of [employer] or Organizations: Yes  Attends Banker Meetings: More than 4 times per year    Marital Status: Widowed    Review of Systems Per HPI  Objective:  BP (!) 145/84   Pulse 71   Temp (!) 97.5 F (36.4 C)   Ht 5' 2.5" (1.588 m)   Wt 216 lb (98 kg)   SpO2 95%   BMI 38.88 kg/m      01/03/2024    3:56 PM 01/02/2024    7:08 PM 01/02/2024    4:47 PM  BP/Weight  Systolic BP 145 142 147  Diastolic BP 84 77 66  Wt. (Lbs) 216    BMI 38.88 kg/m2      Physical Exam Vitals and nursing note reviewed.  Constitutional:      General: She is not in acute distress.    Appearance: Normal appearance. She is obese.  HENT:     Head: Normocephalic and atraumatic.  Cardiovascular:     Rate and Rhythm: Normal rate and regular  rhythm.  Pulmonary:     Effort: Pulmonary effort is normal.     Breath sounds: Normal breath sounds. No wheezing, rhonchi or rales.  Neurological:     Mental Status: She is alert.  Psychiatric:        Mood and Affect: Mood normal.        Behavior: Behavior normal.     Lab Results  Component Value Date   WBC 9.4 01/02/2024   HGB 12.9 01/02/2024   HCT 39.8 01/02/2024   PLT 267 01/02/2024   GLUCOSE 120 (H) 01/02/2024   CHOL 158 07/20/2023   TRIG 148 07/20/2023   HDL 48 07/20/2023   LDLCALC 84 07/20/2023   ALT 22 01/02/2024   AST 21 01/02/2024   NA 139 01/02/2024   K 3.4 (L) 01/02/2024   CL 105 01/02/2024   CREATININE 0.62 01/02/2024   BUN 12 01/02/2024   CO2 24 01/02/2024   TSH 2.820 06/10/2022   INR 0.9 02/03/2022   HGBA1C 6.2 (H) 07/20/2023     Assessment & Plan:  Hypokalemia Assessment & Plan: Etiology unclear.  Further workup today with additional labs.  Potassium supplementation sent in.  Orders: -     Magnesium -     Urine Sodium & Potassium -     Potassium Chloride Crys ER; Take 1 tablet (20 mEq total) by mouth daily.  Dispense: 4 tablet; Refill: 0  Diabetes mellitus without complication (HCC) -     Hemoglobin A1c  Hyperlipidemia, unspecified hyperlipidemia type -     Lipid panel    Follow-up:  1-2 weeks  Everlene Other DO [facility]

## 2024-01-04 ENCOUNTER — Encounter: Payer: Self-pay | Admitting: Family Medicine

## 2024-01-04 ENCOUNTER — Other Ambulatory Visit: Payer: Self-pay | Admitting: Family Medicine

## 2024-01-04 ENCOUNTER — Other Ambulatory Visit: Payer: Self-pay

## 2024-01-04 DIAGNOSIS — E876 Hypokalemia: Secondary | ICD-10-CM

## 2024-01-04 LAB — LIPID PANEL
Chol/HDL Ratio: 2.7 ratio (ref 0.0–4.4)
Cholesterol, Total: 145 mg/dL (ref 100–199)
HDL: 53 mg/dL (ref >39)
LDL Chol Calc (NIH): 69 mg/dL (ref 0–99)
Triglycerides: 133 mg/dL (ref 0–149)
VLDL Cholesterol Cal: 23 mg/dL (ref 5–40)

## 2024-01-04 LAB — MAGNESIUM: Magnesium: 2.5 mg/dL — ABNORMAL HIGH (ref 1.6–2.3)

## 2024-01-04 LAB — HEMOGLOBIN A1C
Est. average glucose Bld gHb Est-mCnc: 143 mg/dL
Hgb A1c MFr Bld: 6.6 % — ABNORMAL HIGH (ref 4.8–5.6)

## 2024-01-05 ENCOUNTER — Other Ambulatory Visit: Payer: Self-pay

## 2024-01-05 ENCOUNTER — Telehealth: Payer: Self-pay | Admitting: *Deleted

## 2024-01-05 LAB — SPECIMEN STATUS REPORT

## 2024-01-05 NOTE — Telephone Encounter (Unsigned)
 Copied from CRM [id number]. Topic: Clinical - Lab/Test Results >> Jan 05, 2024  3:41 PM Corinna Lines S wrote: Reason for CRM: pt stated needed to do a urine test but when she went to [facility]-was told doc didn't put in the order-she went to pcp office yesterday and gave the sample in office-she recvd a message to cb-no notes in system- called doc office no answer..please call member back with any information

## 2024-01-06 LAB — SODIUM, URINE, RANDOM: Sodium, Ur: 127 mmol/L

## 2024-01-06 LAB — POTASSIUM, URINE, RANDOM: Potassium Urine: 57 mmol/L

## 2024-01-06 LAB — SPECIMEN STATUS REPORT

## 2024-01-06 NOTE — Telephone Encounter (Signed)
 Tommie Sams, DO     We are taking care of this. [facility] is processing specimen.

## 2024-01-06 NOTE — Telephone Encounter (Signed)
 Patient has been made aware of urine specimen status.

## 2024-01-07 ENCOUNTER — Encounter: Payer: Self-pay | Admitting: Family Medicine

## 2024-01-07 ENCOUNTER — Other Ambulatory Visit: Payer: Self-pay | Admitting: Family Medicine

## 2024-01-07 DIAGNOSIS — E876 Hypokalemia: Secondary | ICD-10-CM

## 2024-01-10 DIAGNOSIS — E876 Hypokalemia: Secondary | ICD-10-CM | POA: Diagnosis not present

## 2024-01-11 ENCOUNTER — Encounter: Payer: Self-pay | Admitting: Family Medicine

## 2024-01-11 LAB — BASIC METABOLIC PANEL WITH GFR
BUN/Creatinine Ratio: 14 (ref 12–28)
BUN: 10 mg/dL (ref 8–27)
CO2: 24 mmol/L (ref 20–29)
Calcium: 9.5 mg/dL (ref 8.7–10.3)
Chloride: 105 mmol/L (ref 96–106)
Creatinine, Ser: 0.7 mg/dL (ref 0.57–1.00)
Glucose: 82 mg/dL (ref 70–99)
Potassium: 4.6 mmol/L (ref 3.5–5.2)
Sodium: 144 mmol/L (ref 134–144)
eGFR: 94 mL/min/{1.73_m2} (ref >59)

## 2024-01-12 ENCOUNTER — Other Ambulatory Visit: Payer: Self-pay | Admitting: Family Medicine

## 2024-01-12 DIAGNOSIS — F418 Other specified anxiety disorders: Secondary | ICD-10-CM

## 2024-01-18 ENCOUNTER — Ambulatory Visit: Payer: Medicare Other | Admitting: Family Medicine

## 2024-01-18 DIAGNOSIS — I10 Essential (primary) hypertension: Secondary | ICD-10-CM | POA: Diagnosis not present

## 2024-01-18 DIAGNOSIS — E876 Hypokalemia: Secondary | ICD-10-CM

## 2024-01-18 DIAGNOSIS — E785 Hyperlipidemia, unspecified: Secondary | ICD-10-CM

## 2024-01-18 DIAGNOSIS — E119 Type 2 diabetes mellitus without complications: Secondary | ICD-10-CM

## 2024-01-18 DIAGNOSIS — E1159 Type 2 diabetes mellitus with other circulatory complications: Secondary | ICD-10-CM

## 2024-01-18 DIAGNOSIS — F419 Anxiety disorder, unspecified: Secondary | ICD-10-CM

## 2024-01-18 MED ORDER — ALPRAZOLAM 0.5 MG PO TABS: ORAL_TABLET | ORAL | 3 refills | Status: AC

## 2024-01-18 NOTE — Assessment & Plan Note (Signed)
 Alprazolam refilled.

## 2024-01-18 NOTE — Assessment & Plan Note (Signed)
 A1c at goal.

## 2024-01-18 NOTE — Progress Notes (Signed)
 Subjective:  Patient ID: Jessica Kirby, female    DOB: 18-Apr-1956  Age: 68 y.o. MRN: [id number]  CC:   Chief Complaint  Patient presents with   Hypertension   Diabetes    HPI:  68 year old female presents for follow-up.  Hypokalemia resolved.  She is feeling well.  Lipids at goal.  A1c at goal.  Patient uses alprazolam  sparingly for anxiety.  She needs refill.  Patient denies chest pain or shortness of breath.  She does note that her ears have been stuffy recently.  Patient Active Problem List   Diagnosis Date Noted   Hypokalemia 01/03/2024   Overactive bladder 08/11/2023   Chronic idiopathic constipation 10/15/2022   Hyperlipidemia 03/25/2022   Diabetes mellitus without complication (HCC) 11/28/2020   Anxiety 11/04/2015   GERD (gastroesophageal reflux disease) 09/27/2013   Hypertension    Osteopenia     Social Hx   Social History   Socioeconomic History   Marital status: Widowed    Spouse name: Not on file   Number of children: Not on file   Years of education: Not on file   Highest education level: Not on file  Occupational History   Not on file  Tobacco Use   Smoking status: Never    Passive exposure: Yes   Smokeless tobacco: Never  Vaping Use   Vaping status: Never Used  Substance and Sexual Activity   Alcohol use: No    Alcohol/week: 0.0 standard drinks of alcohol   Drug use: No   Sexual activity: Yes  Other Topics Concern   Not on file  Social History Narrative   Not on file   Social Drivers of Health   Financial Resource Strain: Low Risk  (05/14/2023)   Overall Financial Resource Strain (CARDIA)    Difficulty of Paying Living Expenses: Not hard at all  Food Insecurity: No Food Insecurity (05/14/2023)   Hunger Vital Sign    Worried About Running Out of Food in the Last Year: Never true    Ran Out of Food in the Last Year: Never true  Transportation Needs: No Transportation Needs (05/14/2023)   PRAPARE - Scientist, research (physical sciences) (Medical): No    Lack of Transportation (Non-Medical): No  Physical Activity: Sufficiently Active (05/14/2023)   Exercise Vital Sign    Days of Exercise per Week: 7 days    Minutes of Exercise per Session: 30 min  Stress: No Stress Concern Present (05/14/2023)   [employer] of Occupational Health - Occupational Stress Questionnaire    Feeling of Stress : Not at all  Social Connections: Moderately Integrated (05/14/2023)   Social Connection and Isolation Panel [NHANES]    Frequency of Communication with Friends and Family: More than three times a week    Frequency of Social Gatherings with Friends and Family: More than three times a week    Attends Religious Services: More than 4 times per year    Active Member of [employer] or Organizations: Yes    Attends Banker Meetings: More than 4 times per year    Marital Status: Widowed    Review of Systems Per HPI  Objective:  BP 129/82   Pulse 77   Temp 97.9 F (36.6 C)   Ht 5' 2.5" (1.588 m)   Wt 218 lb (98.9 kg)   SpO2 96%   BMI 39.24 kg/m      01/18/2024    8:45 AM 01/03/2024    3:56 PM 01/02/2024  7:08 PM  BP/Weight  Systolic BP 129 145 142  Diastolic BP 82 84 77  Wt. (Lbs) 218 216   BMI 39.24 kg/m2 38.88 kg/m2     Physical Exam Vitals and nursing note reviewed.  Constitutional:      Appearance: Normal appearance. She is obese.  HENT:     Head: Normocephalic and atraumatic.     Right Ear: Tympanic membrane normal.     Left Ear: Tympanic membrane normal.     Nose: Nose normal.  Eyes:     General:        Right eye: No discharge.        Left eye: No discharge.     Conjunctiva/sclera: Conjunctivae normal.  Cardiovascular:     Rate and Rhythm: Normal rate and regular rhythm.     Comments: Soft systolic murmur. Pulmonary:     Effort: Pulmonary effort is normal.     Breath sounds: Normal breath sounds. No wheezing, rhonchi or rales.  Neurological:     Mental Status: She is alert.   Psychiatric:        Mood and Affect: Mood normal.        Behavior: Behavior normal.     Lab Results  Component Value Date   WBC 9.4 01/02/2024   HGB 12.9 01/02/2024   HCT 39.8 01/02/2024   PLT 267 01/02/2024   GLUCOSE 82 01/10/2024   CHOL 145 01/03/2024   TRIG 133 01/03/2024   HDL 53 01/03/2024   LDLCALC 69 01/03/2024   ALT 22 01/02/2024   AST 21 01/02/2024   NA 144 01/10/2024   K 4.6 01/10/2024   CL 105 01/10/2024   CREATININE 0.70 01/10/2024   BUN 10 01/10/2024   CO2 24 01/10/2024   TSH 2.820 06/10/2022   INR 0.9 02/03/2022   HGBA1C 6.6 (H) 01/03/2024     Assessment & Plan:  Primary hypertension Assessment & Plan: Blood pressure well-controlled on amlodipine  and metoprolol .  Continue.   Diabetes mellitus without complication (HCC) Assessment & Plan: A1c at goal.   Hypokalemia Assessment & Plan: Resolved.   Hyperlipidemia, unspecified hyperlipidemia type Assessment & Plan: LDL at goal.  Continue Crestor .   Anxiety Assessment & Plan: Alprazolam  refilled.  Orders: -     ALPRAZolam ; TAKE 1 TABLET(0.5 MG) BY MOUTH DAILY AS NEEDED FOR ANXIETY  Dispense: 30 tablet; Refill: 3    Follow-up: 6 Months  Sabrena Gavitt Debrah Fan DO [facility]

## 2024-01-18 NOTE — Assessment & Plan Note (Signed)
 Resolved

## 2024-01-18 NOTE — Assessment & Plan Note (Signed)
LDL at goal.  Continue Crestor. 

## 2024-01-18 NOTE — Patient Instructions (Signed)
 Continue your current medication.  Follow up in 6 months.  Take care  Dr. Debrah Fan

## 2024-01-18 NOTE — Assessment & Plan Note (Signed)
Blood pressure well-controlled on amlodipine and metoprolol.  Continue.

## 2024-02-07 DIAGNOSIS — M79672 Pain in left foot: Secondary | ICD-10-CM | POA: Diagnosis not present

## 2024-02-07 DIAGNOSIS — M79675 Pain in left toe(s): Secondary | ICD-10-CM | POA: Diagnosis not present

## 2024-02-07 DIAGNOSIS — L110 Acquired keratosis follicularis: Secondary | ICD-10-CM | POA: Diagnosis not present

## 2024-02-07 DIAGNOSIS — M79671 Pain in right foot: Secondary | ICD-10-CM | POA: Diagnosis not present

## 2024-02-07 DIAGNOSIS — E1140 Type 2 diabetes mellitus with diabetic neuropathy, unspecified: Secondary | ICD-10-CM | POA: Diagnosis not present

## 2024-02-07 DIAGNOSIS — M79674 Pain in right toe(s): Secondary | ICD-10-CM | POA: Diagnosis not present

## 2024-02-21 NOTE — Progress Notes (Addendum)
 GI Office Note    Referring Provider: Cook, Jayce G, DO Primary Care Physician:  Myrna Ast, DO Primary Gastroenterologist: Urban Garden, MD  Date:  02/22/2024  ID:  Jessica Kirby, DOB 1955/10/07, MRN [id number]   Chief Complaint   Chief Complaint  Patient presents with   Follow-up    Follow up. No problems    History of Present Illness  Jessica Kirby is a 68 y.o. female with a history of constipation, GERD, diabetes, hypothyroidism, and anxiety/depression  presenting today for follow up.   Last colonoscopy in 2017: -Sigmoid and descending diverticulosis   BPE 04/02/23: no stricture or mass identified. No hernia. (EGD offered - patient preferred to monitor and discuss at follow up)    EGD 06/03/23: - No endoscopic esophageal abnormality to explain patient' s dysphagia. S/p dilation  - 1 cm hiatal hernia. - Normal stomach.  - Normal examined duodenum.  - No specimens collected - Continue pantoprazole  20 mg BID, if no improvement consider increasing to 40 mg BID  Last office visit 08/23/23. Still having issues some with pills but does okay if she drinks plenty of water . Overall swallowing improved since dilation. Reflux controlled with pantoprazole  and dietary avoidance of triggers. Taking miralax as needed and increased fiber intake in diet. Colace as needed. Advised to continue low dose pantoprazole , high fiber diet. Colace and miralax as needed. And GERD diet reinforced, f/u 6 months.   Today:  Felt like when she was taking pantoprazole  she felt like she was having cramping in her chest and has not had any acid issues and not really. Will take her pills with applesauce to make sure they go down and drinks plenty of water . Sometimes she thinks they may dissolve too fast before she gets them down.   Denies any nausea, vomiting, early satiety, lack of appetite, melena, BRBPR, abdominal pain, diarrhea.  Every so often she is having some bloating with  constipation and she will take miralax if that starts back and the colace as well. At the most she states this is occurring about once per month. Has been trying to keep her body moving - going to the gym and states she needs to get back on losing weight.   Wt Readings from Last 3 Encounters:  02/22/24 222 lb 3.2 oz (100.8 kg)  01/18/24 218 lb (98.9 kg)  01/03/24 216 lb (98 kg)    Current Outpatient Medications  Medication Sig Dispense Refill   Accu-Chek Softclix Lancets lancets USE TO CHECK BLOOD SUGAR DAILY BEFORE MEALS THREE TIMES DAILY 200 each 1   ALPRAZolam  (XANAX ) 0.5 MG tablet TAKE 1 TABLET(0.5 MG) BY MOUTH DAILY AS NEEDED FOR ANXIETY 30 tablet 3   amLODipine  (NORVASC ) 10 MG tablet Take 1 tablet (10 mg total) by mouth daily. 90 tablet 3   aspirin  EC 81 MG tablet Take 81 mg by mouth daily.      fluticasone  (FLONASE ) 50 MCG/ACT nasal spray Place 2 sprays into both nostrils daily. 16 g 6   glucose blood (ACCU-CHEK GUIDE) test strip USE TO CHECK BLOOD SUGARS DAILY BEFORE MEALS 100 strip 3   levocetirizine (XYZAL ) 5 MG tablet Take 1 tablet (5 mg total) by mouth every evening. 90 tablet 3   metoprolol  succinate (TOPROL -XL) 25 MG 24 hr tablet Take 1 tablet (25 mg total) by mouth daily. 90 tablet 3   rosuvastatin  (CRESTOR ) 10 MG tablet TAKE 1 TABLET(10 MG) BY MOUTH DAILY 90 tablet 3   mirabegron  ER (MYRBETRIQ )  25 MG TB24 tablet Take 1 tablet (25 mg total) by mouth daily. (Patient not taking: Reported on 02/22/2024) 30 tablet 5   pantoprazole  (PROTONIX ) 20 MG tablet Take 1 tablet (20 mg total) by mouth daily. (Patient not taking: Reported on 02/22/2024) 90 tablet 3   polyethylene glycol (MIRALAX / GLYCOLAX) 17 g packet Take 17 g by mouth daily. (Patient not taking: Reported on 02/22/2024)     No current facility-administered medications for this visit.    Past Medical History:  Diagnosis Date   Allergy    Anxiety    Insomnia   Depression    Insomnia   Diabetes mellitus without  complication (HCC) 11/28/2020   GERD (gastroesophageal reflux disease)    Herniated disc    Hypertension    Hypothyroidism    not medicated at this time   Osteoporosis    Thyroid  disease    Hypothyroidism    Past Surgical History:  Procedure Laterality Date   ABDOMINAL HYSTERECTOMY     COLONOSCOPY N/A 04/07/2016   Procedure: COLONOSCOPY;  Surgeon: Suzette Espy, MD;  [facility];  Service: Endoscopy;  Laterality: N/A;  8:30 AM   ESOPHAGOGASTRODUODENOSCOPY (EGD) WITH PROPOFOL  N/A 06/03/2023   Procedure: ESOPHAGOGASTRODUODENOSCOPY (EGD) WITH PROPOFOL ;  Surgeon: Urban Garden, MD;  Location: [facility];  Service: Gastroenterology;  Laterality: N/A;  11:30 am, asa 2   LEFT HEART CATH AND CORONARY ANGIOGRAPHY N/A 07/01/2022   Procedure: LEFT HEART CATH AND CORONARY ANGIOGRAPHY;  Surgeon: Millicent Ally, MD;  Location: [facility];  Service: Cardiovascular;  Laterality: N/A;   SAVORY DILATION N/A 06/03/2023   Procedure: SAVORY DILATION;  Surgeon: Urban Garden, MD;  Location: [facility];  Service: Gastroenterology;  Laterality: N/A;   TUBAL LIGATION     WRIST SURGERY      Family History  Problem Relation Age of Onset   Diabetes Father    Heart attack Mother    Heart disease Mother    Bladder Cancer Other    Colon cancer Neg Hx     Allergies as of 02/22/2024 - Review Complete 02/22/2024  Allergen Reaction Noted   Fosamax [alendronate sodium] Other (See Comments) 12/21/2012   Vioxx [rofecoxib]  12/21/2012   Losartan  Cough 06/13/2015   Sulfa antibiotics Hives 09/10/2011    Social History   Socioeconomic History   Marital status: Widowed    Spouse name: Not on file   Number of children: Not on file   Years of education: Not on file   Highest education level: Not on file  Occupational History   Not on file  Tobacco Use   Smoking status: Never    Passive exposure: Yes   Smokeless tobacco: Never  Vaping Use   Vaping status: Never  Used  Substance and Sexual Activity   Alcohol use: No    Alcohol/week: 0.0 standard drinks of alcohol   Drug use: No   Sexual activity: Yes  Other Topics Concern   Not on file  Social History Narrative   Not on file   Social Drivers of Health   Financial Resource Strain: Low Risk  (05/14/2023)   Overall Financial Resource Strain (CARDIA)    Difficulty of Paying Living Expenses: Not hard at all  Food Insecurity: No Food Insecurity (05/14/2023)   Hunger Vital Sign    Worried About Running Out of Food in the Last Year: Never true    Ran Out of Food in the Last Year: Never true  Transportation Needs: No Transportation Needs (05/14/2023)   PRAPARE - Administrator, Civil Service (Medical): No    Lack of Transportation (Non-Medical): No  Physical Activity: Sufficiently Active (05/14/2023)   Exercise Vital Sign    Days of Exercise per Week: 7 days    Minutes of Exercise per Session: 30 min  Stress: No Stress Concern Present (05/14/2023)   [employer] of Occupational Health - Occupational Stress Questionnaire    Feeling of Stress : Not at all  Social Connections: Moderately Integrated (05/14/2023)   Social Connection and Isolation Panel [NHANES]    Frequency of Communication with Friends and Family: More than three times a week    Frequency of Social Gatherings with Friends and Family: More than three times a week    Attends Religious Services: More than 4 times per year    Active Member of [employer] or Organizations: Yes    Attends Banker Meetings: More than 4 times per year    Marital Status: Widowed     Review of Systems   Gen: Denies fever, chills, anorexia. Denies fatigue, weakness, weight loss.  CV: Denies chest pain, palpitations, syncope, peripheral edema, and claudication. Resp: Denies dyspnea at rest, cough, wheezing, coughing up blood, and pleurisy. GI: See HPI Derm: Denies rash, itching, dry skin Psych: Denies depression, anxiety, memory loss,  confusion. No homicidal or suicidal ideation.  Heme: Denies bruising, bleeding, and enlarged lymph nodes.  Physical Exam   BP 135/78 (BP Location: Right Arm, Patient Position: Sitting, Cuff Size: Large)   Pulse 76   Temp (!) 96.9 F (36.1 C) (Temporal)   Ht 5\' 3"  (1.6 m)   Wt 222 lb 3.2 oz (100.8 kg)   BMI 39.36 kg/m   General:   Alert and oriented. No distress noted. Pleasant and cooperative.  Head:  Normocephalic and atraumatic. Eyes:  Conjuctiva clear without scleral icterus. Mouth:  Oral mucosa pink and moist. Good dentition. No lesions. Abdomen:  +BS, soft, non-tender and non-distended. No rebound or guarding. No HSM or masses noted. Rectal: deferred Neurologic:  Alert and  oriented x4 Psych:  Alert and cooperative. Normal mood and affect.  Assessment  Jessica Kirby is a 68 y.o. female with a history of GERD and dysphagia,  constipation, diabetes, hypothyroidism, and anxiety/depression presenting today for follow up.   Constipation, bloating: Only needing MiraLAX and Colace about once per month.  Has been having almost a daily bowel movement without hard stools or needing to strain.  Will continue current regimen, advised to continue high-fiber diet.  Given some intermittent bloating we did discuss probiotic options today given she is wondering if this or a daily cleanse would be beneficial.  Discussed against cleanses today but did advise that there may be some utility in probiotics for her although research/evidence is mixed.  GERD: Well-controlled with dietary appointments.  Currently only using pantoprazole  as needed as she was feeling more "blah" or having chest discomfort while taking this on a regular basis therefore she has stopped taking it frequently.  Advised if she has any return of symptoms she can use it as needed versus Tums or Pepcid .  Dysphagia: Continues to have improvement post empiric dilation.  Continues to have intermittent difficulties with medications  however does well as long as she drinks plenty of water  and takes her medications with applesauce.  PLAN   Continue pantoprazole  20 mg as needed.  Consider Tums or Pepcid  as needed. Colace and miralax as needed High fiber  diet GERD diet Discussed probiotic options  Continue to work toward weight loss - offered referral to healthy weight and wellness (will consider but difficult giving fear of traffic to [place]) Continue regular exercise and adequate water  intake. Follow up 1 year   Julian Obey, MSN, FNP-BC, AGACNP-BC [facility]  I have reviewed the note and agree with the APP's assessment as described in this progress note  Samantha Cress, MD Gastroenterology and Hepatology [facility]

## 2024-02-22 ENCOUNTER — Encounter: Payer: Self-pay | Admitting: Gastroenterology

## 2024-02-22 ENCOUNTER — Ambulatory Visit (INDEPENDENT_AMBULATORY_CARE_PROVIDER_SITE_OTHER): Payer: Medicare Other | Admitting: Gastroenterology

## 2024-02-22 VITALS — BP 135/78 | HR 76 | Temp 96.9°F | Ht 63.0 in | Wt 222.2 lb

## 2024-02-22 DIAGNOSIS — K5900 Constipation, unspecified: Secondary | ICD-10-CM | POA: Diagnosis not present

## 2024-02-22 DIAGNOSIS — R1310 Dysphagia, unspecified: Secondary | ICD-10-CM | POA: Diagnosis not present

## 2024-02-22 DIAGNOSIS — R140 Abdominal distension (gaseous): Secondary | ICD-10-CM

## 2024-02-22 DIAGNOSIS — K219 Gastro-esophageal reflux disease without esophagitis: Secondary | ICD-10-CM

## 2024-02-22 DIAGNOSIS — K5904 Chronic idiopathic constipation: Secondary | ICD-10-CM

## 2024-02-22 NOTE — Patient Instructions (Addendum)
 Continue pantoprazole , miralax, and colace as needed.  Continue high fiber diet and avoiding trigger foods for reflux.   Continue to work toward weight loss - if you decide you would like to see [facility] program in [place] please let me know and I will send in referral.   Probiotic options: A general recommendation is to choose probiotic products with at least 1 billion colony-forming units and containing the genus Lactobacillus, Bifidobacterium, Bacillus or Saccharomyces boulardii, some of the most researched probiotics.  Align Valda Garnet' colon health Florastor Culturelle  We will plan a follow-up in 1 year, sooner if needed.  It was a pleasure to see you today. I want to create trusting relationships with patients. If you receive a survey regarding your visit,  I greatly appreciate you taking time to fill this out on paper or through your MyChart. I value your feedback.  Julian Obey, MSN, FNP-BC, AGACNP-BC [facility]

## 2024-02-28 ENCOUNTER — Ambulatory Visit: Admitting: Family Medicine

## 2024-02-28 ENCOUNTER — Ambulatory Visit: Payer: Self-pay

## 2024-02-28 ENCOUNTER — Encounter: Payer: Self-pay | Admitting: Family Medicine

## 2024-02-28 VITALS — BP 169/73 | HR 90 | Temp 98.2°F | Ht 63.0 in | Wt 219.0 lb

## 2024-02-28 DIAGNOSIS — L089 Local infection of the skin and subcutaneous tissue, unspecified: Secondary | ICD-10-CM

## 2024-02-28 DIAGNOSIS — R1030 Lower abdominal pain, unspecified: Secondary | ICD-10-CM

## 2024-02-28 LAB — POCT URINALYSIS DIP (CLINITEK)
Bilirubin, UA: NEGATIVE
Blood, UA: NEGATIVE
Glucose, UA: NEGATIVE mg/dL
Ketones, POC UA: NEGATIVE mg/dL
Leukocytes, UA: NEGATIVE
Nitrite, UA: NEGATIVE
POC PROTEIN,UA: NEGATIVE
Spec Grav, UA: >=1.03 — AB (ref 1.010–1.025)
Urobilinogen, UA: 0.2 U/dL
pH, UA: 6 (ref 5.0–8.0)

## 2024-02-28 MED ORDER — DOXYCYCLINE HYCLATE 100 MG PO TABS
100.0000 mg | ORAL_TABLET | Freq: Two times a day (BID) | ORAL | 0 refills | Status: DC
Start: 2024-02-28 — End: 2024-07-19

## 2024-02-28 MED ORDER — MUPIROCIN 2 % EX OINT: 1.0000 | TOPICAL_OINTMENT | Freq: Three times a day (TID) | CUTANEOUS | 0 refills | Status: AC

## 2024-02-28 NOTE — Telephone Encounter (Signed)
 Copied from CRM [id number]. Topic: Clinical - Red Word Triage >> Feb 28, 2024  8:31 AM Rosamond Comes wrote: Red Word that prompted transfer to Nurse Triage: patient calling, bottom of stomach where laparoscopy incision has a lump/puffy  very sore, fells like a little discharge, surgery done years ago. Bloated having bowel movement more frequent, 2 or 3 times a day.  Chief Complaint: low abd pain, around old laparoscopy site; puffy/lump Symptoms: see above Frequency: constant Pertinent Negatives: Patient denies fever, pain with urination, sob Disposition: [] ED /[] Urgent Care (no appt availability in office) / [x] Appointment(In office/virtual)/ []  [facility]/ [] Home Care/ [] Refused Recommended Disposition /[] [facility]/ []  Follow-up with PCP Additional Notes: apt made for tomorrow; care advice given, denies questions; instructed to go to ER if becomes worse.   Reason for Disposition  [1] MILD-MODERATE pain AND [2] constant AND [3] present > 2 hours  Answer Assessment - Initial Assessment Questions 1. LOCATION: "Where does it hurt?"      Bottom of abd; states had a lap years ago and now incision, lump/puffy and sore; with discharge 2. RADIATION: "Does the pain shoot anywhere else?" (e.g., chest, back)     no 3. ONSET: "When did the pain begin?" (e.g., minutes, hours or days ago)      This weekend 4. SUDDEN: "Gradual or sudden onset?"     gradual 5. PATTERN "Does the pain come and go, or is it constant?"    - If it comes and goes: "How long does it last?" "Do you have pain now?"     (Note: Comes and goes means the pain is intermittent. It goes away completely between bouts.)    - If constant: "Is it getting better, staying the same, or getting worse?"      (Note: Constant means the pain never goes away completely; most serious pain is constant and gets worse.)      constant 6. SEVERITY: "How bad is the pain?"  (e.g., Scale 1-10; mild, moderate, or severe)    - MILD (1-3):  Doesn't interfere with normal activities, abdomen soft and not tender to touch.     - MODERATE (4-7): Interferes with normal activities or awakens from sleep, abdomen tender to touch.     - SEVERE (8-10): Excruciating pain, doubled over, unable to do any normal activities.       Moderate - severe 7. RECURRENT SYMPTOM: "Have you ever had this type of stomach pain before?" If Yes, ask: "When was the last time?" and "What happened that time?"      no 8. CAUSE: "What do you think is causing the stomach pain?"     no 9. RELIEVING/AGGRAVATING FACTORS: "What makes it better or worse?" (e.g., antacids, bending or twisting motion, bowel movement)     no 10. OTHER SYMPTOMS: "Do you have any other symptoms?" (e.g., back pain, diarrhea, fever, urination pain, vomiting)       no 11. PREGNANCY: "Is there any chance you are pregnant?" "When was your last menstrual period?"       na  Protocols used: Abdominal Pain - [facility]

## 2024-02-28 NOTE — Telephone Encounter (Signed)
 Dr Debrah Fan had a cancellation- appointment moved to 2:30 pm today- patient is aware

## 2024-02-28 NOTE — Patient Instructions (Signed)
 Medication as prescribed.  Call with concerns.

## 2024-02-29 ENCOUNTER — Ambulatory Visit: Admitting: Family Medicine

## 2024-02-29 DIAGNOSIS — L089 Local infection of the skin and subcutaneous tissue, unspecified: Secondary | ICD-10-CM | POA: Insufficient documentation

## 2024-02-29 NOTE — Progress Notes (Signed)
 Subjective:  Patient ID: Jessica Kirby, female    DOB: 1955/12/10  Age: 68 y.o. MRN: [id number]  CC:   Chief Complaint  Patient presents with   sore on lower abd    Swollen on lower abd area x1 week , drainage noted, feeling fatigued   HPI:  68 year old female presents for evaluation of the above.  Patient reports a 1 week history of pain in the suprapubic region.  She states that she has a raised skin lesion in its area that is causing pain.  No current drainage.  Patient reports that she is feeling fatigued.  No fever.  No relieving factors.  No urinary symptoms.  Patient Active Problem List   Diagnosis Date Noted   Skin infection 02/29/2024   Overactive bladder 08/11/2023   Chronic idiopathic constipation 10/15/2022   Hyperlipidemia 03/25/2022   Diabetes mellitus without complication (HCC) 11/28/2020   Anxiety 11/04/2015   GERD (gastroesophageal reflux disease) 09/27/2013   Hypertension    Osteopenia     Social Hx   Social History   Socioeconomic History   Marital status: Widowed    Spouse name: Not on file   Number of children: Not on file   Years of education: Not on file   Highest education level: Not on file  Occupational History   Not on file  Tobacco Use   Smoking status: Never    Passive exposure: Yes   Smokeless tobacco: Never  Vaping Use   Vaping status: Never Used  Substance and Sexual Activity   Alcohol use: No    Alcohol/week: 0.0 standard drinks of alcohol   Drug use: No   Sexual activity: Yes  Other Topics Concern   Not on file  Social History Narrative   Not on file   Social Drivers of Health   Financial Resource Strain: Low Risk  (05/14/2023)   Overall Financial Resource Strain (CARDIA)    Difficulty of Paying Living Expenses: Not hard at all  Food Insecurity: No Food Insecurity (05/14/2023)   Hunger Vital Sign    Worried About Running Out of Food in the Last Year: Never true    Ran Out of Food in the Last Year: Never true   Transportation Needs: No Transportation Needs (05/14/2023)   PRAPARE - Administrator, Civil Service (Medical): No    Lack of Transportation (Non-Medical): No  Physical Activity: Sufficiently Active (05/14/2023)   Exercise Vital Sign    Days of Exercise per Week: 7 days    Minutes of Exercise per Session: 30 min  Stress: No Stress Concern Present (05/14/2023)   [employer] of Occupational Health - Occupational Stress Questionnaire    Feeling of Stress : Not at all  Social Connections: Moderately Integrated (05/14/2023)   Social Connection and Isolation Panel [NHANES]    Frequency of Communication with Friends and Family: More than three times a week    Frequency of Social Gatherings with Friends and Family: More than three times a week    Attends Religious Services: More than 4 times per year    Active Member of [employer] or Organizations: Yes    Attends Banker Meetings: More than 4 times per year    Marital Status: Widowed    Review of Systems Per HPI  Objective:  BP (!) 169/73   Pulse 90   Temp 98.2 F (36.8 C)   Ht 5\' 3"  (1.6 m)   Wt 219 lb (99.3 kg)   SpO2 96%  BMI 38.79 kg/m      02/28/2024    2:28 PM 02/22/2024    8:31 AM 01/18/2024    8:45 AM  BP/Weight  Systolic BP 169 135 129  Diastolic BP 73 78 82  Wt. (Lbs) 219 222.2 218  BMI 38.79 kg/m2 39.36 kg/m2 39.24 kg/m2    Physical Exam Constitutional:      Appearance: Normal appearance. She is obese.  HENT:     Head: Normocephalic and atraumatic.  Cardiovascular:     Rate and Rhythm: Normal rate and regular rhythm.  Pulmonary:     Effort: Pulmonary effort is normal.     Breath sounds: Normal breath sounds.  Abdominal:     General: There is no distension.     Palpations: Abdomen is soft.       Comments: Small raised erythematous skin lesion at the left location.  Tender to palpation.  No significant fluctuance.  Neurological:     Mental Status: She is alert.     Lab Results   Component Value Date   WBC 9.4 01/02/2024   HGB 12.9 01/02/2024   HCT 39.8 01/02/2024   PLT 267 01/02/2024   GLUCOSE 82 01/10/2024   CHOL 145 01/03/2024   TRIG 133 01/03/2024   HDL 53 01/03/2024   LDLCALC 69 01/03/2024   ALT 22 01/02/2024   AST 21 01/02/2024   NA 144 01/10/2024   K 4.6 01/10/2024   CL 105 01/10/2024   CREATININE 0.70 01/10/2024   BUN 10 01/10/2024   CO2 24 01/10/2024   TSH 2.820 06/10/2022   INR 0.9 02/03/2022   HGBA1C 6.6 (H) 01/03/2024     Assessment & Plan:  Skin infection Assessment & Plan: Treating with doxycycline  and mupirocin.  Orders: -     Doxycycline  Hyclate; Take 1 tablet (100 mg total) by mouth 2 (two) times daily. Take with food and water .  Dispense: 14 tablet; Refill: 0 -     Mupirocin; Apply 1 Application topically 3 (three) times daily for 7 days.  Dispense: 30 g; Refill: 0  Lower abdominal pain -     POCT URINALYSIS DIP (CLINITEK)    Follow-up:  Return if symptoms worsen or fail to improve.  Kathleen Papa DO [facility]

## 2024-02-29 NOTE — Assessment & Plan Note (Signed)
 Treating with doxycycline  and mupirocin.

## 2024-03-22 ENCOUNTER — Telehealth: Payer: Self-pay | Admitting: *Deleted

## 2024-03-22 NOTE — Telephone Encounter (Signed)
 Copied from CRM [id number]. Topic: Clinical - Request for Lab/Test Order >> Mar 22, 2024  3:32 PM Rosaria BRAVO wrote: Reason for CRM: Wants to have potassium checked  Best contact: [phone]

## 2024-03-29 ENCOUNTER — Encounter: Payer: Self-pay | Admitting: *Deleted

## 2024-03-29 ENCOUNTER — Other Ambulatory Visit: Payer: Self-pay | Admitting: Family Medicine

## 2024-03-29 DIAGNOSIS — I10 Essential (primary) hypertension: Secondary | ICD-10-CM

## 2024-03-29 NOTE — Telephone Encounter (Signed)
 Patient notified via mychart

## 2024-03-30 ENCOUNTER — Ambulatory Visit: Payer: Self-pay | Admitting: Family Medicine

## 2024-03-30 LAB — BASIC METABOLIC PANEL WITH GFR
BUN/Creatinine Ratio: 11 — ABNORMAL LOW (ref 12–28)
BUN: 8 mg/dL (ref 8–27)
CO2: 23 mmol/L (ref 20–29)
Calcium: 9.7 mg/dL (ref 8.7–10.3)
Chloride: 104 mmol/L (ref 96–106)
Creatinine, Ser: 0.76 mg/dL (ref 0.57–1.00)
Glucose: 97 mg/dL (ref 70–99)
Potassium: 4.3 mmol/L (ref 3.5–5.2)
Sodium: 142 mmol/L (ref 134–144)
eGFR: 85 mL/min/{1.73_m2} (ref >59)

## 2024-04-17 DIAGNOSIS — E1140 Type 2 diabetes mellitus with diabetic neuropathy, unspecified: Secondary | ICD-10-CM | POA: Diagnosis not present

## 2024-04-17 DIAGNOSIS — M79675 Pain in left toe(s): Secondary | ICD-10-CM | POA: Diagnosis not present

## 2024-04-17 DIAGNOSIS — M79674 Pain in right toe(s): Secondary | ICD-10-CM | POA: Diagnosis not present

## 2024-04-17 DIAGNOSIS — M79672 Pain in left foot: Secondary | ICD-10-CM | POA: Diagnosis not present

## 2024-04-17 DIAGNOSIS — M79671 Pain in right foot: Secondary | ICD-10-CM | POA: Diagnosis not present

## 2024-04-17 DIAGNOSIS — L110 Acquired keratosis follicularis: Secondary | ICD-10-CM | POA: Diagnosis not present

## 2024-04-27 DIAGNOSIS — C44519 Basal cell carcinoma of skin of other part of trunk: Secondary | ICD-10-CM | POA: Diagnosis not present

## 2024-04-27 DIAGNOSIS — Z1283 Encounter for screening for malignant neoplasm of skin: Secondary | ICD-10-CM | POA: Diagnosis not present

## 2024-04-27 DIAGNOSIS — D225 Melanocytic nevi of trunk: Secondary | ICD-10-CM | POA: Diagnosis not present

## 2024-05-19 ENCOUNTER — Ambulatory Visit (INDEPENDENT_AMBULATORY_CARE_PROVIDER_SITE_OTHER): Payer: Medicare Other

## 2024-05-19 VITALS — Ht 63.0 in | Wt 219.0 lb

## 2024-05-19 DIAGNOSIS — Z0000 Encounter for general adult medical examination without abnormal findings: Secondary | ICD-10-CM

## 2024-05-19 NOTE — Patient Instructions (Addendum)
 Jessica Kirby , Thank you for taking time out of your busy schedule to complete your Annual Wellness Visit with me. I enjoyed our conversation and look forward to speaking with you again next year. I, as well as your care team,  appreciate your ongoing commitment to your health goals. Please review the following plan we discussed and let me know if I can assist you in the future. Your Game plan/ To Do List      Follow up Visits: We will see or speak with you next year for your Next Medicare AWV with our clinical staff Have you seen your provider in the last 6 months (3 months if uncontrolled diabetes)? Yes  Clinician Recommendations:  Aim for 30 minutes of exercise or brisk walking, 6-8 glasses of water , and 5 servings of fruits and vegetables each day.        This is a list of the screenings recommended for you:  Health Maintenance  Topic Date Due   Pneumococcal Vaccine for age over 36 (2 of 2 - PCV) 07/27/2020   COVID-19 Vaccine (6 - 2024-25 season) 05/30/2023   Flu Shot  04/28/2024   Hemoglobin A1C  07/04/2024   Yearly kidney health urinalysis for diabetes  07/19/2024   Complete foot exam   07/19/2024   Mammogram  08/10/2024   Eye exam for diabetics  10/19/2024   Yearly kidney function blood test for diabetes  03/29/2025   Medicare Annual Wellness Visit  05/19/2025   Colon Cancer Screening  04/07/2026   DTaP/Tdap/Td vaccine (2 - Td or Tdap) 08/05/2028   DEXA scan (bone density measurement)  Completed   Zoster (Shingles) Vaccine  Completed   HPV Vaccine  Aged Out   Meningitis B Vaccine  Aged Out   Hepatitis C Screening  Discontinued    Advanced directives: (ACP Link)Information on Advanced Care Planning can be found at [place]  Secretary of [facility] Advance Health Care Directives Advance Health Care Directives. [email or web address]   Advance Care Planning is important because it:  [x]  Makes sure you receive the medical care that is consistent with your values, goals, and  preferences  [x]  It provides guidance to your family and loved ones and reduces their decisional burden about whether or not they are making the right decisions based on your wishes.  Follow the link provided in your after visit summary or read over the paperwork we have mailed to you to help you started getting your Advance Directives in place. If you need assistance in completing these, please reach out to us  so that we can help you!  See attachments for Preventive Care and Fall Prevention Tips.

## 2024-05-19 NOTE — Progress Notes (Signed)
 Subjective:   Jessica Kirby is a 68 y.o. who presents for a Medicare Wellness preventive visit.  As a reminder, Annual Wellness Visits don't include a physical exam, and some assessments may be limited, especially if this visit is performed virtually. We may recommend an in-person follow-up visit with your provider if needed.  Visit Complete: Virtual I connected with  Jessica Kirby on 05/19/24 by a audio enabled telemedicine application and verified that I am speaking with the correct person using two identifiers.  Patient Location: Home  Provider Location: Home Office  I discussed the limitations of evaluation and management by telemedicine. The patient expressed understanding and agreed to proceed.  Vital Signs: Because this visit was a virtual/telehealth visit, some criteria may be missing or patient reported. Any vitals not documented were not able to be obtained and vitals that have been documented are patient reported.  VideoDeclined- This patient declined Librarian, academic. Therefore the visit was completed with audio only.  Persons Participating in Visit: Patient.  AWV Questionnaire: No: Patient Medicare AWV questionnaire was not completed prior to this visit.  Cardiac Risk Factors include: advanced age (>103men, >73 women);diabetes mellitus;dyslipidemia;hypertension     Objective:    Today's Vitals   05/19/24 1451  Weight: 219 lb (99.3 kg)  Height: 5' 3 (1.6 m)   Body mass index is 38.79 kg/m.     05/19/2024    2:46 PM 01/02/2024   11:41 AM 06/03/2023   10:03 AM 05/14/2023   10:36 AM 02/21/2023    9:03 PM 07/01/2022    6:46 AM 02/03/2022    3:16 PM  Advanced Directives  Does Patient Have a Medical Advance Directive? No No No No No No No  Would patient like information on creating a medical advance directive? Yes (MAU/Ambulatory/Procedural Areas - Information given)  No - Patient declined No - Patient declined  Yes  (MAU/Ambulatory/Procedural Areas - Information given)     Current Medications (verified) Outpatient Encounter Medications as of 05/19/2024  Medication Sig   Accu-Chek Softclix Lancets lancets USE TO CHECK BLOOD SUGAR DAILY BEFORE MEALS THREE TIMES DAILY   ALPRAZolam  (XANAX ) 0.5 MG tablet TAKE 1 TABLET(0.5 MG) BY MOUTH DAILY AS NEEDED FOR ANXIETY   amLODipine  (NORVASC ) 10 MG tablet Take 1 tablet (10 mg total) by mouth daily.   aspirin  EC 81 MG tablet Take 81 mg by mouth daily.    fluticasone  (FLONASE ) 50 MCG/ACT nasal spray Place 2 sprays into both nostrils daily.   glucose blood (ACCU-CHEK GUIDE) test strip USE TO CHECK BLOOD SUGARS DAILY BEFORE MEALS   levocetirizine (XYZAL ) 5 MG tablet Take 1 tablet (5 mg total) by mouth every evening.   metoprolol  succinate (TOPROL -XL) 25 MG 24 hr tablet Take 1 tablet (25 mg total) by mouth daily.   pantoprazole  (PROTONIX ) 20 MG tablet Take 1 tablet (20 mg total) by mouth daily.   polyethylene glycol (MIRALAX / GLYCOLAX) 17 g packet Take 17 g by mouth daily.   rosuvastatin  (CRESTOR ) 10 MG tablet TAKE 1 TABLET(10 MG) BY MOUTH DAILY   doxycycline  (VIBRA -TABS) 100 MG tablet Take 1 tablet (100 mg total) by mouth 2 (two) times daily. Take with food and water .   mirabegron  ER (MYRBETRIQ ) 25 MG TB24 tablet Take 1 tablet (25 mg total) by mouth daily. (Patient not taking: Reported on 05/19/2024)   No facility-administered encounter medications on file as of 05/19/2024.    Allergies (verified) Fosamax [alendronate sodium], Vioxx [rofecoxib], Losartan , and Sulfa antibiotics  History: Past Medical History:  Diagnosis Date   Allergy    Anxiety    Insomnia   Depression    Insomnia   Diabetes mellitus without complication (HCC) 11/28/2020   GERD (gastroesophageal reflux disease)    Herniated disc    Hypertension    Hypothyroidism    not medicated at this time   Osteoporosis    Thyroid  disease    Hypothyroidism   Past Surgical History:  Procedure  Laterality Date   ABDOMINAL HYSTERECTOMY     COLONOSCOPY N/A 04/07/2016   Procedure: COLONOSCOPY;  Surgeon: Lamar CHRISTELLA Hollingshead, MD;  [facility];  Service: Endoscopy;  Laterality: N/A;  8:30 AM   ESOPHAGOGASTRODUODENOSCOPY (EGD) WITH PROPOFOL  N/A 06/03/2023   Procedure: ESOPHAGOGASTRODUODENOSCOPY (EGD) WITH PROPOFOL ;  Surgeon: Eartha Angelia Sieving, MD;  Location: [facility];  Service: Gastroenterology;  Laterality: N/A;  11:30 am, asa 2   LEFT HEART CATH AND CORONARY ANGIOGRAPHY N/A 07/01/2022   Procedure: LEFT HEART CATH AND CORONARY ANGIOGRAPHY;  Surgeon: Burnard Debby LABOR, MD;  Location: [facility];  Service: Cardiovascular;  Laterality: N/A;   SAVORY DILATION N/A 06/03/2023   Procedure: SAVORY DILATION;  Surgeon: Eartha Angelia Sieving, MD;  Location: [facility];  Service: Gastroenterology;  Laterality: N/A;   TUBAL LIGATION     WRIST SURGERY     Family History  Problem Relation Age of Onset   Diabetes Father    Heart attack Mother    Heart disease Mother    Bladder Cancer Other    Colon cancer Neg Hx    Social History   Socioeconomic History   Marital status: Widowed    Spouse name: Not on file   Number of children: Not on file   Years of education: Not on file   Highest education level: Not on file  Occupational History   Not on file  Tobacco Use   Smoking status: Never    Passive exposure: Yes   Smokeless tobacco: Never  Vaping Use   Vaping status: Never Used  Substance and Sexual Activity   Alcohol use: No    Alcohol/week: 0.0 standard drinks of alcohol   Drug use: No   Sexual activity: Yes  Other Topics Concern   Not on file  Social History Narrative   Not on file   Social Drivers of Health   Financial Resource Strain: Low Risk  (05/14/2023)   Overall Financial Resource Strain (CARDIA)    Difficulty of Paying Living Expenses: Not hard at all  Food Insecurity: No Food Insecurity (05/14/2023)   Hunger Vital Sign    Worried About Running  Out of Food in the Last Year: Never true    Ran Out of Food in the Last Year: Never true  Transportation Needs: No Transportation Needs (05/14/2023)   PRAPARE - Administrator, Civil Service (Medical): No    Lack of Transportation (Non-Medical): No  Physical Activity: Sufficiently Active (05/14/2023)   Exercise Vital Sign    Days of Exercise per Week: 7 days    Minutes of Exercise per Session: 30 min  Stress: No Stress Concern Present (05/14/2023)   [employer] of Occupational Health - Occupational Stress Questionnaire    Feeling of Stress : Not at all  Social Connections: Moderately Integrated (05/14/2023)   Social Connection and Isolation Panel    Frequency of Communication with Friends and Family: More than three times a week    Frequency of Social Gatherings with Friends and  Family: More than three times a week    Attends Religious Services: More than 4 times per year    Active Member of Clubs or Organizations: Yes    Attends Banker Meetings: More than 4 times per year    Marital Status: Widowed    Tobacco Counseling Counseling given: Not Answered    Clinical Intake:  Pre-visit preparation completed: Yes  Pain : No/denies pain  Diabetes: Yes CBG done?: No Did pt. bring in CBG monitor from home?: No  Lab Results  Component Value Date   HGBA1C 6.6 (H) 01/03/2024   HGBA1C 6.2 (H) 07/20/2023   HGBA1C 7.0 (H) 03/03/2023     How often do you need to have someone help you when you read instructions, pamphlets, or other written materials from your doctor or pharmacy?: 1 - Never  Interpreter Needed?: No  Information entered by :: Charmaine Bloodgood LPN   Activities of Daily Living     05/19/2024    2:46 PM  In your present state of health, do you have any difficulty performing the following activities:  Hearing? 0  Vision? 0  Difficulty concentrating or making decisions? 0  Walking or climbing stairs? 0  Dressing or bathing? 0  Doing  errands, shopping? 0  Preparing Food and eating ? N  Using the Toilet? N  In the past six months, have you accidently leaked urine? N  Do you have problems with loss of bowel control? N  Managing your Medications? N  Managing your Finances? N  Housekeeping or managing your Housekeeping? N    Patient Care Team: Cook, Jayce G, DO as PCP - General (Family Medicine) Mona Vinie BROCKS, MD as PCP - Cardiology (Cardiology) Fernande Lamar LABOR, DPM as Referring Physician (Podiatry) [facility], [facility]  Kennedy Charmaine CROME, NP as Nurse Practitioner (Gastroenterology)  I have updated your Care Teams any recent Medical Services you may have received from other providers in the past year.     Assessment:   This is a routine wellness examination for Angelyne.  Hearing/Vision screen No results found.   Goals Addressed             This Visit's Progress    Patient Stated   Not on track    Lose weight       Depression Screen     01/18/2024    8:47 AM 01/03/2024    4:10 PM 11/25/2023    2:16 PM 10/25/2023    3:52 PM 08/20/2023    2:00 PM 08/11/2023    8:57 AM 07/20/2023    9:44 AM  PHQ 2/9 Scores  PHQ - 2 Score 1 0 0 0 0 0 0  PHQ- 9 Score 5 4 3 3 3 5 5     Fall Risk     05/19/2024    2:46 PM 01/03/2024    4:10 PM 11/25/2023    2:16 PM 10/25/2023    3:52 PM 08/20/2023    2:00 PM  Fall Risk   Falls in the past year? 1 1 1 1  0  Number falls in past yr: 0 0 0 0   Injury with Fall? 0 0 1 1   Risk for fall due to : History of fall(s);Impaired balance/gait;Impaired mobility  History of fall(s)  No Fall Risks  Follow up Education provided;Falls prevention discussed;Falls evaluation completed  Falls evaluation completed  Falls evaluation completed    MEDICARE RISK AT HOME:  Medicare Risk at Home Any stairs  in or around the home?: No If so, are there any without handrails?: No Home free of loose throw rugs in walkways, pet beds, electrical cords, etc?: Yes Adequate  lighting in your home to reduce risk of falls?: Yes Life alert?: No Use of a cane, walker or w/c?: No Grab bars in the bathroom?: Yes Shower chair or bench in shower?: No Elevated toilet seat or a handicapped toilet?: Yes  TIMED UP AND GO:  Was the test performed?  No  Cognitive Function: Declined/Normal: No cognitive concerns noted by patient or family. Patient alert, oriented, able to answer questions appropriately and recall recent events. No signs of memory loss or confusion.        05/14/2023   10:42 AM  6CIT Screen  What Year? 0 points  What month? 0 points  What time? 0 points  Count back from 20 0 points  Months in reverse 0 points  Repeat phrase 0 points  Total Score 0 points    Immunizations Immunization History  Administered Date(s) Administered   Fluad Quad(high Dose 65+) 06/23/2019, 06/25/2022   Fluad Trivalent(High Dose 65+) 07/20/2023   Influenza,inj,Quad PF,6+ Mos 08/05/2018   Influenza-Unspecified 07/08/2017, 07/29/2019, 06/22/2020, 06/18/2021   Moderna Sars-Covid-2 Vaccination 11/29/2019, 12/27/2019, 08/01/2020, 01/23/2021, 08/06/2021   Pneumococcal Polysaccharide-23 07/28/2019   Tdap 08/05/2018   Zoster Recombinant(Shingrix) 07/22/2023, 10/01/2023    Screening Tests Health Maintenance  Topic Date Due   Pneumococcal Vaccine: 50+ Years (2 of 2 - PCV) 07/27/2020   COVID-19 Vaccine (6 - 2024-25 season) 05/30/2023   INFLUENZA VACCINE  04/28/2024   HEMOGLOBIN A1C  07/04/2024   Diabetic kidney evaluation - Urine ACR  07/19/2024   FOOT EXAM  07/19/2024   MAMMOGRAM  08/10/2024   OPHTHALMOLOGY EXAM  10/19/2024   Diabetic kidney evaluation - eGFR measurement  03/29/2025   Medicare Annual Wellness (AWV)  05/19/2025   Colonoscopy  04/07/2026   DTaP/Tdap/Td (2 - Td or Tdap) 08/05/2028   DEXA SCAN  Completed   Zoster Vaccines- Shingrix  Completed   HPV VACCINES  Aged Out   Meningococcal B Vaccine  Aged Out   Hepatitis C Screening  Discontinued     Health Maintenance  Health Maintenance Due  Topic Date Due   Pneumococcal Vaccine: 50+ Years (2 of 2 - PCV) 07/27/2020   COVID-19 Vaccine (6 - 2024-25 season) 05/30/2023   INFLUENZA VACCINE  04/28/2024   Health Maintenance Items Addressed: Information provided on recommended vaccines   Additional Screening:  Vision Screening: Recommended annual ophthalmology exams for early detection of glaucoma and other disorders of the eye. Would you like a referral to an eye doctor? No    Dental Screening: Recommended annual dental exams for proper oral hygiene  Community Resource Referral / Chronic Care Management: CRR required this visit?  No   CCM required this visit?  No   Plan:    I have personally reviewed and noted the following in the patient's chart:   Medical and social history Use of alcohol, tobacco or illicit drugs  Current medications and supplements including opioid prescriptions. Patient is not currently taking opioid prescriptions. Functional ability and status Nutritional status Physical activity Advanced directives List of other physicians Hospitalizations, surgeries, and ER visits in previous 12 months Vitals Screenings to include cognitive, depression, and falls Referrals and appointments  In addition, I have reviewed and discussed with patient certain preventive protocols, quality metrics, and best practice recommendations. A written personalized care plan for preventive services as well as general preventive  health recommendations were provided to patient.   Lavelle Pfeiffer [place], [place]   1/[id number]/7974   After Visit Summary: (MyChart) Due to this being a telephonic visit, the after visit summary with patients personalized plan was offered to patient via MyChart   Notes: PCP Follow Up Recommendations: patient would like to discuss weight loss options at next office visit

## 2024-06-08 DIAGNOSIS — Z08 Encounter for follow-up examination after completed treatment for malignant neoplasm: Secondary | ICD-10-CM | POA: Diagnosis not present

## 2024-06-08 DIAGNOSIS — Z85828 Personal history of other malignant neoplasm of skin: Secondary | ICD-10-CM | POA: Diagnosis not present

## 2024-06-09 ENCOUNTER — Ambulatory Visit: Payer: Self-pay

## 2024-06-09 NOTE — Telephone Encounter (Signed)
 FYI Only or Action Required?: Action required by provider: clinical question for provider.  Patient was last seen in primary care on 02/28/2024 by Cook, Jayce G, DO.  Called Nurse Triage reporting Fatigue.  Symptoms began a couple months ago.  Symptoms are: unchanged.  Triage Disposition: See PCP When Office is Open (Within 3 Days)  Patient/caregiver understands and will follow disposition?: No, wishes to speak with PCP       Copied from CRM #[id number]. Topic: Clinical - Red Word Triage >> Jun 09, 2024  1:14 PM Donna BRAVO wrote: Red Word that prompted transfer to Nurse Triage: patient  first asking about if it's time for her flu shot, patient asking for lab tests.  Patient is feeling more tired, low energy that is getting worse, gaining weight         Reason for Disposition  [1] Fatigue (i.e., tires easily, decreased energy) AND [2] persists > 1 week  Answer Assessment - Initial Assessment Questions Patient wanted to have her thyroid  checked prior to her upcoming appointment in October. She also wanted to see if Dr. Bluford wanted to order any other blood work as well. Please advise.        1. DESCRIPTION: Describe how you are feeling.     I'm feeling low energy, when I'm done with the gym or something like that I'm just done for the day 2. SEVERITY: How bad is it?  Can you stand and walk?     Mild to moderate  3. ONSET: When did these symptoms begin? (e.g., hours, days, weeks, months)      1-2 months  4. CAUSE: What do you think is causing the weakness or fatigue? (e.g., not drinking enough fluids, medical problem, trouble sleeping)     History of thyroid  problems  5. NEW MEDICINES:  Have you started on any new medicines recently? (e.g., opioid pain medicines, benzodiazepines, muscle relaxants, antidepressants, antihistamines, neuroleptics, beta blockers)     No 6. OTHER SYMPTOMS: Do you have any other symptoms? (e.g., chest pain, fever, cough, SOB,  vomiting, diarrhea, bleeding, other areas of pain)     Weight gain  Protocols used: Weakness (Generalized) and Fatigue-A-AH

## 2024-07-03 DIAGNOSIS — M79674 Pain in right toe(s): Secondary | ICD-10-CM | POA: Diagnosis not present

## 2024-07-03 DIAGNOSIS — M79675 Pain in left toe(s): Secondary | ICD-10-CM | POA: Diagnosis not present

## 2024-07-03 DIAGNOSIS — E1140 Type 2 diabetes mellitus with diabetic neuropathy, unspecified: Secondary | ICD-10-CM | POA: Diagnosis not present

## 2024-07-03 DIAGNOSIS — M79672 Pain in left foot: Secondary | ICD-10-CM | POA: Diagnosis not present

## 2024-07-03 DIAGNOSIS — L110 Acquired keratosis follicularis: Secondary | ICD-10-CM | POA: Diagnosis not present

## 2024-07-03 DIAGNOSIS — M79671 Pain in right foot: Secondary | ICD-10-CM | POA: Diagnosis not present

## 2024-07-10 ENCOUNTER — Other Ambulatory Visit: Payer: Self-pay | Admitting: Family Medicine

## 2024-07-10 DIAGNOSIS — E785 Hyperlipidemia, unspecified: Secondary | ICD-10-CM

## 2024-07-12 ENCOUNTER — Encounter (INDEPENDENT_AMBULATORY_CARE_PROVIDER_SITE_OTHER): Payer: Self-pay | Admitting: Gastroenterology

## 2024-07-19 ENCOUNTER — Encounter: Payer: Self-pay | Admitting: Family Medicine

## 2024-07-19 ENCOUNTER — Ambulatory Visit (INDEPENDENT_AMBULATORY_CARE_PROVIDER_SITE_OTHER): Admitting: Family Medicine

## 2024-07-19 VITALS — BP 138/82 | HR 77 | Temp 97.5°F | Ht 63.0 in | Wt 217.0 lb

## 2024-07-19 DIAGNOSIS — I10 Essential (primary) hypertension: Secondary | ICD-10-CM | POA: Diagnosis not present

## 2024-07-19 DIAGNOSIS — R5383 Other fatigue: Secondary | ICD-10-CM | POA: Diagnosis not present

## 2024-07-19 DIAGNOSIS — E785 Hyperlipidemia, unspecified: Secondary | ICD-10-CM | POA: Diagnosis not present

## 2024-07-19 DIAGNOSIS — N3281 Overactive bladder: Secondary | ICD-10-CM

## 2024-07-19 DIAGNOSIS — E119 Type 2 diabetes mellitus without complications: Secondary | ICD-10-CM | POA: Diagnosis not present

## 2024-07-19 MED ORDER — CETIRIZINE HCL 5 MG PO TABS: 5.0000 mg | ORAL_TABLET | Freq: Every day | ORAL | 3 refills | Status: AC

## 2024-07-19 NOTE — Assessment & Plan Note (Signed)
 Stable.  A1c today.  Foot exam performed today.

## 2024-07-19 NOTE — Assessment & Plan Note (Signed)
 Myrbetriq  discontinued.  We discussed lifestyle modifications.

## 2024-07-19 NOTE — Assessment & Plan Note (Signed)
 Xyzal  likely contributing.  Switching to Zyrtec .  Labs today to assess potential underlying causes.  Advised healthy diet and regular exercise.

## 2024-07-19 NOTE — Assessment & Plan Note (Signed)
Stable.  Continue amlodipine and metoprolol.

## 2024-07-19 NOTE — Progress Notes (Signed)
 Subjective:  Patient ID: Jessica Kirby, female    DOB: Jul 16, 1956  Age: 68 y.o. MRN: [id number]  CC:  Follow up   HPI:  68 year old female presents for follow-up.  Patient states that for the past few weeks she has had decreased energy level and has felt tired.  She states that Xyzal  seems to make her drowsy.  She would like to change this.  She is unsure if this is contributing to her fatigue.  She would like labs done including thyroid  testing.  Blood pressure stable on metoprolol  and amlodipine .  Patient discontinued Myrbetriq  as she states that she did not tolerate.  A1c and lipids have been at goal.  Needs labs today to assess.  She is compliant with Crestor .  Patient desires her flu vaccine today.  However, we do not have any additional high-dose vaccines at this time.  We we will have these at the end of the week/early next week.  She will make a nurse visit appointment for this.  Patient Active Problem List   Diagnosis Date Noted   Fatigue 07/19/2024   Overactive bladder 08/11/2023   Chronic idiopathic constipation 10/15/2022   Hyperlipidemia 03/25/2022   Diabetes mellitus without complication (HCC) 11/28/2020   Anxiety 11/04/2015   GERD (gastroesophageal reflux disease) 09/27/2013   Hypertension    Osteopenia     Social Hx   Social History   Socioeconomic History   Marital status: Widowed    Spouse name: Not on file   Number of children: Not on file   Years of education: Not on file   Highest education level: Not on file  Occupational History   Not on file  Tobacco Use   Smoking status: Never    Passive exposure: Yes   Smokeless tobacco: Never  Vaping Use   Vaping status: Never Used  Substance and Sexual Activity   Alcohol use: No    Alcohol/week: 0.0 standard drinks of alcohol   Drug use: No   Sexual activity: Yes  Other Topics Concern   Not on file  Social History Narrative   Not on file   Social Drivers of Health   Financial Resource  Strain: Low Risk  (05/14/2023)   Overall Financial Resource Strain (CARDIA)    Difficulty of Paying Living Expenses: Not hard at all  Food Insecurity: No Food Insecurity (05/14/2023)   Hunger Vital Sign    Worried About Running Out of Food in the Last Year: Never true    Ran Out of Food in the Last Year: Never true  Transportation Needs: No Transportation Needs (05/14/2023)   PRAPARE - Administrator, Civil Service (Medical): No    Lack of Transportation (Non-Medical): No  Physical Activity: Sufficiently Active (05/14/2023)   Exercise Vital Sign    Days of Exercise per Week: 7 days    Minutes of Exercise per Session: 30 min  Stress: No Stress Concern Present (05/14/2023)   [employer] of Occupational Health - Occupational Stress Questionnaire    Feeling of Stress : Not at all  Social Connections: Moderately Integrated (05/14/2023)   Social Connection and Isolation Panel    Frequency of Communication with Friends and Family: More than three times a week    Frequency of Social Gatherings with Friends and Family: More than three times a week    Attends Religious Services: More than 4 times per year    Active Member of [employer] or Organizations: Yes    Attends Banker  Meetings: More than 4 times per year    Marital Status: Widowed    Review of Systems Per HPI  Objective:  BP 138/82   Pulse 77   Temp (!) 97.5 F (36.4 C)   Ht 5' 3 (1.6 m)   Wt 217 lb (98.4 kg)   SpO2 95%   BMI 38.44 kg/m      07/19/2024    8:52 AM 05/19/2024    2:51 PM 02/28/2024    2:28 PM  BP/Weight  Systolic BP 138 -- 169  Diastolic BP 82 -- 73  Wt. (Lbs) 217 219 219  BMI 38.44 kg/m2 38.79 kg/m2 38.79 kg/m2    Physical Exam Vitals and nursing note reviewed.  Constitutional:      General: She is not in acute distress.    Appearance: Normal appearance. She is obese.  HENT:     Head: Normocephalic and atraumatic.  Eyes:     General:        Right eye: No discharge.         Left eye: No discharge.     Conjunctiva/sclera: Conjunctivae normal.  Cardiovascular:     Rate and Rhythm: Normal rate and regular rhythm.  Pulmonary:     Effort: Pulmonary effort is normal.     Breath sounds: Normal breath sounds. No wheezing, rhonchi or rales.  Feet:     Comments: Diabetic foot exam performed today.  See quality metrics section. Neurological:     Mental Status: She is alert.  Psychiatric:        Mood and Affect: Mood normal.        Behavior: Behavior normal.     Lab Results  Component Value Date   WBC 9.4 01/02/2024   HGB 12.9 01/02/2024   HCT 39.8 01/02/2024   PLT 267 01/02/2024   GLUCOSE 97 03/29/2024   CHOL 145 01/03/2024   TRIG 133 01/03/2024   HDL 53 01/03/2024   LDLCALC 69 01/03/2024   ALT 22 01/02/2024   AST 21 01/02/2024   NA 142 03/29/2024   K 4.3 03/29/2024   CL 104 03/29/2024   CREATININE 0.76 03/29/2024   BUN 8 03/29/2024   CO2 23 03/29/2024   TSH 2.820 06/10/2022   INR 0.9 02/03/2022   HGBA1C 6.6 (H) 01/03/2024     Assessment & Plan:  Fatigue, unspecified type Assessment & Plan: Xyzal  likely contributing.  Switching to Zyrtec .  Labs today to assess potential underlying causes.  Advised healthy diet and regular exercise.  Orders: -     CBC -     TSH  Primary hypertension Assessment & Plan: Stable.  Continue amlodipine  and metoprolol .   Diabetes mellitus without complication (HCC) Assessment & Plan: Stable.  A1c today.  Foot exam performed today.  Orders: -     CMP14+EGFR -     Hemoglobin A1c -     Microalbumin / creatinine urine ratio  Hyperlipidemia, unspecified hyperlipidemia type Assessment & Plan: LDL at goal.  Lipid panel today.  Continue Crestor .  Orders: -     Lipid panel  Overactive bladder Assessment & Plan: Myrbetriq  discontinued.  We discussed lifestyle modifications.   Other orders -     Cetirizine  HCl; Take 1 tablet (5 mg total) by mouth daily.  Dispense: 90 tablet; Refill: 3    Follow-up: 6  months  Rease Swinson Bluford DO [facility]

## 2024-07-19 NOTE — Patient Instructions (Signed)
 Labs today.  Zyrtec  sent in.  Stop Xyzal .  Regular exercise and healthy diet.  Schedule nurse visit for your flu vaccine next week.  Follow-up in 6 months.

## 2024-07-19 NOTE — Assessment & Plan Note (Signed)
 LDL at goal.  Lipid panel today.  Continue Crestor .

## 2024-07-20 ENCOUNTER — Ambulatory Visit: Payer: Self-pay | Admitting: Family Medicine

## 2024-07-20 LAB — CBC
Hematocrit: 43.3 % (ref 34.0–46.6)
Hemoglobin: 14 g/dL (ref 11.1–15.9)
MCH: 28.5 pg (ref 26.6–33.0)
MCHC: 32.3 g/dL (ref 31.5–35.7)
MCV: 88 fL (ref 79–97)
Platelets: 296 x10E3/uL (ref 150–450)
RBC: 4.92 x10E6/uL (ref 3.77–5.28)
RDW: 12.2 % (ref 11.7–15.4)
WBC: 10.6 x10E3/uL (ref 3.4–10.8)

## 2024-07-20 LAB — CMP14+EGFR
ALT: 21 IU/L (ref 0–32)
AST: 23 IU/L (ref 0–40)
Albumin: 4.6 g/dL (ref 3.9–4.9)
Alkaline Phosphatase: 91 IU/L (ref 49–135)
BUN/Creatinine Ratio: 14 (ref 12–28)
BUN: 10 mg/dL (ref 8–27)
Bilirubin Total: 0.8 mg/dL (ref 0.0–1.2)
CO2: 24 mmol/L (ref 20–29)
Calcium: 9.4 mg/dL (ref 8.7–10.3)
Chloride: 103 mmol/L (ref 96–106)
Creatinine, Ser: 0.74 mg/dL (ref 0.57–1.00)
Globulin, Total: 2.6 g/dL (ref 1.5–4.5)
Glucose: 110 mg/dL — ABNORMAL HIGH (ref 70–99)
Potassium: 4.3 mmol/L (ref 3.5–5.2)
Sodium: 143 mmol/L (ref 134–144)
Total Protein: 7.2 g/dL (ref 6.0–8.5)
eGFR: 87 mL/min/1.73 (ref >59)

## 2024-07-20 LAB — LIPID PANEL
Chol/HDL Ratio: 3.8 ratio (ref 0.0–4.4)
Cholesterol, Total: 184 mg/dL (ref 100–199)
HDL: 48 mg/dL (ref >39)
LDL Chol Calc (NIH): 108 mg/dL — ABNORMAL HIGH (ref 0–99)
Triglycerides: 160 mg/dL — ABNORMAL HIGH (ref 0–149)
VLDL Cholesterol Cal: 28 mg/dL (ref 5–40)

## 2024-07-20 LAB — HEMOGLOBIN A1C
Est. average glucose Bld gHb Est-mCnc: 143 mg/dL
Hgb A1c MFr Bld: 6.6 % — ABNORMAL HIGH (ref 4.8–5.6)

## 2024-07-20 LAB — TSH: TSH: 4.18 u[IU]/mL (ref 0.450–4.500)

## 2024-07-21 LAB — MICROALBUMIN / CREATININE URINE RATIO
Creatinine, Urine: 18.7 mg/dL
Microalb/Creat Ratio: 22 mg/g{creat} (ref 0–29)
Microalbumin, Urine: 4.2 ug/mL

## 2024-07-23 ENCOUNTER — Other Ambulatory Visit: Payer: Self-pay | Admitting: Family Medicine

## 2024-07-23 DIAGNOSIS — E785 Hyperlipidemia, unspecified: Secondary | ICD-10-CM

## 2024-07-23 MED ORDER — ROSUVASTATIN CALCIUM 20 MG PO TABS: 20.0000 mg | ORAL_TABLET | Freq: Every day | ORAL | 3 refills | Status: AC

## 2024-07-25 ENCOUNTER — Ambulatory Visit

## 2024-07-25 ENCOUNTER — Ambulatory Visit
Admission: EM | Admit: 2024-07-25 | Discharge: 2024-07-25 | Disposition: A | Discharge status: Home / Self Care | Attending: Nurse Practitioner | Admitting: Nurse Practitioner

## 2024-07-25 ENCOUNTER — Ambulatory Visit: Payer: Self-pay

## 2024-07-25 DIAGNOSIS — R0789 Other chest pain: Secondary | ICD-10-CM

## 2024-07-25 DIAGNOSIS — W19XXXA Unspecified fall, initial encounter: Secondary | ICD-10-CM | POA: Diagnosis not present

## 2024-07-25 DIAGNOSIS — Z23 Encounter for immunization: Secondary | ICD-10-CM

## 2024-07-25 MED ORDER — TIZANIDINE HCL 4 MG PO TABS: 4.0000 mg | ORAL_TABLET | Freq: Three times a day (TID) | ORAL | 0 refills | Status: DC | PRN

## 2024-07-25 NOTE — ED Triage Notes (Signed)
 Pt reports she tripped over a case of waters and fell on her the middle of her chest that radiates to her upper back earlier today. States the middle of her chest is sore. Reports whole rib cage area hurts.  Denies hitting head.

## 2024-07-25 NOTE — Discharge Instructions (Signed)
 The xray does not show any broken bones, collapsed lung, or fluid in your lungs today.  Recommend Tylenol  500 to 1000 mg every 6 hours for pain as well as the tizanidine every 8 hours as needed for muscular pain.  You can apply warm compresses, Biofreeze, other topical agents to your rib cage as needed for pain.  Recommend close follow-up with primary care provider later this week if symptoms are not significantly improved.  If symptoms worsen, please seek care emergently.

## 2024-07-25 NOTE — Telephone Encounter (Signed)
 FYI Only or Action Required?: FYI only for provider.  Patient was last seen in primary care on 07/19/2024 by Jessica Kirby, Jessica G, DO.  Called Nurse Triage reporting Fall and Chest Injury.  Symptoms began today.  Interventions attempted: Nothing.  Symptoms are: trip and fall over water  bottles landed on chest and complains of pain under both breasts gradually worsening.  Triage Disposition: See HCP Within 4 Hours (Or PCP Triage) (overriding See Physician Within 24 Hours)  Patient/caregiver understands and will follow disposition?: Yes            Copied from CRM [id number]. Topic: Clinical - Red Word Triage >> Jul 25, 2024  1:56 PM Jessica Kirby wrote: Red Word that prompted transfer to Nurse Triage: The patient fell a few minutes ago. She tripped over over 32 pack of bottles. Under both breast she is experiencing soreness. Reason for Disposition  [1] MODERATE pain (e.Kirby., interferes with normal activities) AND [2] high-risk adult (e.Kirby., age > 60 years, osteoporosis, chronic steroid use)  Answer Assessment - Initial Assessment Questions 1. MECHANISM: How did the injury happen?     Tripped and fell over pack of water  bottles. She thinks she threw her head back and landed on her chest. She state it felt like she knocked her breath out.  2. ONSET: When did the injury happen? (.e.Kirby., minutes, hours, days ago)     Now just a few minutes ago.  3. LOCATION: Where on the chest is the injury located?     Under both breasts.  4. APPEARANCE: What does the injury look like?     No lumps/bumps/bruising or deformity noted.  5. BLEEDING: Is there any bleeding now? If Yes, ask: How long has it been bleeding?     No.  6. SEVERITY: Any difficulty with breathing?     No.  7. SIZE: For cuts, bruises, or swelling, ask: How large is it? (e.Kirby., inches or centimeters)     N/A.  8. PAIN: Is there pain? If Yes, ask: How bad is the pain? (e.Kirby., Scale 0-10; none, mild, moderate,  severe)     Soreness. 9/10.  9. TETANUS: For any breaks in the skin, ask: When was your last tetanus booster?     N/A.  Denies any head injury, LOC or other injuries.  Protocols used: Chest Injury-A-AH

## 2024-07-25 NOTE — ED Provider Notes (Signed)
 [facility]    CSN: [id number] Arrival date & time: 07/25/24  1436      History   Chief Complaint Chief Complaint  Patient presents with   Fall    HPI Jessica Kirby is a 68 y.o. female.   Patient presents today for rib cage pain.  Reports she fell in her kitchen a couple of hours ago.  She is having pain along her rib cage and in the entire front part of her body.  Denies any bruising.  Denies hitting her head or loss of consciousness.  Does not take blood thinners.  Reports pain is worse with any movement or with breathing.     Past Medical History:  Diagnosis Date   Allergy    Anxiety    Insomnia   Depression    Insomnia   Diabetes mellitus without complication (HCC) 11/28/2020   GERD (gastroesophageal reflux disease)    Herniated disc    Hypertension    Hypothyroidism    not medicated at this time   Osteoporosis    Thyroid  disease    Hypothyroidism    Patient Active Problem List   Diagnosis Date Noted   Fatigue 07/19/2024   Overactive bladder 08/11/2023   Chronic idiopathic constipation 10/15/2022   Hyperlipidemia 03/25/2022   Diabetes mellitus without complication (HCC) 11/28/2020   Anxiety 11/04/2015   GERD (gastroesophageal reflux disease) 09/27/2013   Hypertension    Osteopenia     Past Surgical History:  Procedure Laterality Date   ABDOMINAL HYSTERECTOMY     COLONOSCOPY N/A 04/07/2016   Procedure: COLONOSCOPY;  Surgeon: Lamar CHRISTELLA Hollingshead, MD;  Location: [facility];  Service: Endoscopy;  Laterality: N/A;  8:30 AM   ESOPHAGOGASTRODUODENOSCOPY (EGD) WITH PROPOFOL  N/A 06/03/2023   Procedure: ESOPHAGOGASTRODUODENOSCOPY (EGD) WITH PROPOFOL ;  Surgeon: Eartha Angelia Sieving, MD;  Location: [facility];  Service: Gastroenterology;  Laterality: N/A;  11:30 am, asa 2   LEFT HEART CATH AND CORONARY ANGIOGRAPHY N/A 07/01/2022   Procedure: LEFT HEART CATH AND CORONARY ANGIOGRAPHY;  Surgeon: Burnard Debby LABOR, MD;  Location: [facility];   Service: Cardiovascular;  Laterality: N/A;   SAVORY DILATION N/A 06/03/2023   Procedure: SAVORY DILATION;  Surgeon: Eartha Angelia Sieving, MD;  Location: [facility];  Service: Gastroenterology;  Laterality: N/A;   TUBAL LIGATION     WRIST SURGERY      OB History   No obstetric history on file.      Home Medications    Prior to Admission medications   Medication Sig Start Date End Date Taking? Authorizing Provider  tiZANidine (ZANAFLEX) 4 MG tablet Take 1 tablet (4 mg total) by mouth every 8 (eight) hours as needed for muscle spasms. Do not take with alcohol or while driving or operating heavy machinery.  May cause drowsiness. 07/25/24  Yes Chandra Harlene LABOR, NP  Accu-Chek Softclix Lancets lancets USE TO CHECK BLOOD SUGAR DAILY BEFORE MEALS THREE TIMES DAILY 07/27/23   Cook, Jayce G, DO  ALPRAZolam  (XANAX ) 0.5 MG tablet TAKE 1 TABLET(0.5 MG) BY MOUTH DAILY AS NEEDED FOR ANXIETY 01/18/24   Cook, Jayce G, DO  amLODipine  (NORVASC ) 10 MG tablet Take 1 tablet (10 mg total) by mouth daily. 10/25/23   Cook, Jayce G, DO  aspirin  EC 81 MG tablet Take 81 mg by mouth daily.     [provider]  cetirizine  (ZYRTEC ) 5 MG tablet Take 1 tablet (5 mg total) by mouth daily. 07/19/24   Cook, Jayce G,  DO  fluticasone  (FLONASE ) 50 MCG/ACT nasal spray Place 2 sprays into both nostrils daily. 08/12/23   Mauro Elveria BROCKS, NP  glucose blood (ACCU-CHEK GUIDE) test strip USE TO CHECK BLOOD SUGARS DAILY BEFORE MEALS 04/06/23   Cook, Jayce G, DO  metoprolol  succinate (TOPROL -XL) 25 MG 24 hr tablet Take 1 tablet (25 mg total) by mouth daily. 11/25/23   Cook, Jayce G, DO  pantoprazole  (PROTONIX ) 20 MG tablet Take 1 tablet (20 mg total) by mouth daily. 07/20/23   Cook, Jayce G, DO  polyethylene glycol (MIRALAX / GLYCOLAX) 17 g packet Take 17 g by mouth daily.    [provider]  rosuvastatin  (CRESTOR ) 20 MG tablet Take 1 tablet (20 mg total) by mouth daily. 07/23/24   Bluford Jacqulyn MATSU, DO    Family  History Family History  Problem Relation Age of Onset   Diabetes Father    Heart attack Mother    Heart disease Mother    Bladder Cancer Other    Colon cancer Neg Hx     Social History Social History   Tobacco Use   Smoking status: Never    Passive exposure: Yes   Smokeless tobacco: Never  Vaping Use   Vaping status: Never Used  Substance Use Topics   Alcohol use: No    Alcohol/week: 0.0 standard drinks of alcohol   Drug use: No     Allergies   Fosamax [alendronate sodium], Vioxx [rofecoxib], Losartan , and Sulfa antibiotics   Review of Systems Review of Systems Per HPI  Physical Exam Triage Vital Signs ED Triage Vitals  Encounter Vitals Group     BP 07/25/24 1515 (!) 145/94     Girls Systolic BP Percentile --      Girls Diastolic BP Percentile --      Boys Systolic BP Percentile --      Boys Diastolic BP Percentile --      Pulse Rate 07/25/24 1515 81     Resp 07/25/24 1515 18     Temp 07/25/24 1515 97.8 F (36.6 C)     Temp Source 07/25/24 1515 Oral     SpO2 07/25/24 1515 96 %     Weight --      Height --      Head Circumference --      Peak Flow --      Pain Score 07/25/24 1514 10     Pain Loc --      Pain Education --      Exclude from Growth Chart --    No data found.  Updated Vital Signs BP (!) 145/94 (BP Location: Right Arm)   Pulse 81   Temp 97.8 F (36.6 C) (Oral)   Resp 18   SpO2 96%   Visual Acuity Right Eye Distance:   Left Eye Distance:   Bilateral Distance:    Right Eye Near:   Left Eye Near:    Bilateral Near:     Physical Exam Vitals and nursing note reviewed.  Constitutional:      General: She is not in acute distress.    Appearance: Normal appearance. She is not toxic-appearing.  HENT:     Head: Normocephalic and atraumatic.     Mouth/Throat:     Mouth: Mucous membranes are moist.     Pharynx: Oropharynx is clear.  Cardiovascular:     Rate and Rhythm: Normal rate.  Pulmonary:     Effort: Pulmonary effort is  normal. No respiratory distress.  Breath sounds: Normal breath sounds. No wheezing, rhonchi or rales.  Chest:     Comments: Chest wall tenderness to palpation diffusely; no bruising, swelling, edema noted. Skin:    General: Skin is warm and dry.     Capillary Refill: Capillary refill takes less than 2 seconds.     Coloration: Skin is not jaundiced or pale.     Findings: No erythema.  Neurological:     Mental Status: She is alert and oriented to person, place, and time.  Psychiatric:        Behavior: Behavior is cooperative.      UC Treatments / Results  Labs (all labs ordered are listed, but only abnormal results are displayed) Labs Reviewed - No data to display  EKG   Radiology DG Chest 2 View Result Date: 07/25/2024 CLINICAL DATA:  Fall with rib cage pain EXAM: CHEST - 2 VIEW COMPARISON:  01/02/2024 FINDINGS: No acute airspace disease or effusion. Cardiomediastinal silhouette within normal limits. No pneumothorax. IMPRESSION: No active cardiopulmonary disease. Dedicated rib series may be performed as indicated. Electronically Signed   By: Luke Bun M.D.   On: 07/25/2024 15:56    Procedures Procedures (including critical care time)  Medications Ordered in UC Medications - No data to display  Initial Impression / Assessment and Plan / UC Course  I have reviewed the triage vital signs and the nursing notes.  Pertinent labs & imaging results that were available during my care of the patient were reviewed by me and considered in my medical decision making (see chart for details).   Patient is well-appearing, normotensive, afebrile, not tachycardic, not tachypneic, oxygenating well on room air.   1. Fall, initial encounter 2. Rib pain Chest x-ray is negative for acute rib fracture, pneumothorax, atelectasis today Supportive care discussed with patient including Tylenol  500 to 1000 mg every 6 hours, muscle relaxant medication, warm compresses, and topical muscle  rubs Return and ER precautions discussed Recommended close follow-up with PCP later this week if symptoms are not improving  The patient was given the opportunity to ask questions.  All questions answered to their satisfaction.  The patient is in agreement to this plan.   Final Clinical Impressions(s) / UC Diagnoses   Final diagnoses:  Fall, initial encounter  Rib pain     Discharge Instructions      The xray does not show any broken bones, collapsed lung, or fluid in your lungs today.  Recommend Tylenol  500 to 1000 mg every 6 hours for pain as well as the tizanidine every 8 hours as needed for muscular pain.  You can apply warm compresses, Biofreeze, other topical agents to your rib cage as needed for pain.  Recommend close follow-up with primary care provider later this week if symptoms are not significantly improved.  If symptoms worsen, please seek care emergently.    ED Prescriptions     Medication Sig Dispense Auth. Provider   tiZANidine (ZANAFLEX) 4 MG tablet Take 1 tablet (4 mg total) by mouth every 8 (eight) hours as needed for muscle spasms. Do not take with alcohol or while driving or operating heavy machinery.  May cause drowsiness. 30 tablet Chandra Harlene LABOR, NP      PDMP not reviewed this encounter.   Chandra Harlene LABOR, NP 07/25/24 [phone]

## 2024-07-28 ENCOUNTER — Ambulatory Visit: Payer: Self-pay

## 2024-07-28 NOTE — Telephone Encounter (Signed)
 FYI Only or Action Required?: FYI only for provider: Home Care.  Patient was last seen in primary care on 07/19/2024 by Cook, Jayce G, DO.  Called Nurse Triage reporting Rib Injury.  Symptoms began several days ago.  Interventions attempted: OTC medications: Tylenol , Prescription medications: TiZANidine, and Rest, hydration, or home remedies.  Symptoms are: stable.  Triage Disposition: Home Care  Patient/caregiver understands and will follow disposition?: Yes Reason for Disposition  [1] Chest wall bruise, pain, or swelling AND [2] present < 7 days  Answer Assessment - Initial Assessment Questions TiZANidine and Tylenol . Imaging from UC came back clear. Advised patient this just happened 3 days ago and will need some time to heal. Educated patient on s/s when to seek medical treatment.   1. MECHANISM: How did the injury happen?     Tripped over pack of water  in kitchen and fell  2. ONSET: When did the injury happen? (.e.g., minutes, hours, days ago)     Tuesday  3. LOCATION: Where on the chest is the injury located?     Around breasts and ribs  4. APPEARANCE: What does the injury look like?     Denies bruising  5. BLEEDING: Is there any bleeding now? If Yes, ask: How long has it been bleeding?     Denies  6. SEVERITY: Any difficulty with breathing?     Denies  7. SIZE: For cuts, bruises, or swelling, ask: How large is it? (e.g., inches or centimeters)     Denies  8. PAIN: Is there pain? If Yes, ask: How bad is the pain? (e.g., Scale 0-10; none, mild, moderate, severe)     Tender to touch, hurts to bed over, hold something. Hard to get comfortable laying down.  Protocols used: Chest Injury-A-AH

## 2024-08-07 ENCOUNTER — Ambulatory Visit: Payer: Self-pay

## 2024-08-07 NOTE — Telephone Encounter (Signed)
 Appointment scheduled.

## 2024-08-07 NOTE — Telephone Encounter (Signed)
 FYI Only or Action Required?: FYI only for provider: appointment scheduled on 08/08/2024.  Patient was last seen in primary care on 07/19/2024 by Cook, Jayce G, DO.  Called Nurse Triage reporting Pain.  Symptoms began fall on 07/25/2024 and pain is worsening.  Interventions attempted: OTC medications:  , Prescription medications:  , and Rest, hydration, or home remedies.  Symptoms are: gradually worsening.  Triage Disposition: See Physician Within 24 Hours  Patient/caregiver understands and will follow disposition?: Yes    Copied from CRM [id number]. Topic: Clinical - Red Word Triage >> Aug 07, 2024  8:35 AM Harlene ORN wrote: Red Word that prompted transfer to Nurse Triage: had a fall on 10/28 was told to go to urgent care and they gave her a muscle relaxer and ran an x-ray for cracks or fluid in her lungs was told if there is any problems to call back was told to take tylenol  and ibuprofen is still in pain pain is in her left side close to her breast and cannot lift her let arm Reason for Disposition  [1] Muscle aches are unexplained AND [2] occur within 1 month of a tick bite  Answer Assessment - Initial Assessment Questions 1. ONSET: When did the muscle aches or body pains start?      07/25/2024 and worsening 2. LOCATION: What part of your body is hurting? (e.g., entire body, arms, legs)      Right and left breast area  that radiates to back 3. SEVERITY: How bad is the pain? (Scale 1-10; or mild, moderate, severe)     severe 4. CAUSE: What do you think is causing the pains?     fall 5. FEVER: Do you have a fever? If Yes, ask: What is your temperature, how was it measured, and  when did it start?      no 6. OTHER SYMPTOMS: Do you have any other symptoms? (e.g., chest pain, cold or flu symptoms, rash, weakness, weight loss)     Decreased ROM right arm throughout breast,  7. PREGNANCY: Is there any chance you are pregnant? When was your last menstrual  period?     na 8. TRAVEL: Have you traveled out of the country in the last month? (e.g., exposures, travel history)     Na  Pt requested earliest appt with Dr. Bluford or Elveria: nurse scheduled appt.  Also if patient can be worked in quicker, please call.  Protocols used: Muscle Aches and Body Pain-A-AH

## 2024-08-08 ENCOUNTER — Ambulatory Visit (HOSPITAL_COMMUNITY)
Admission: RE | Admit: 2024-08-08 | Discharge: 2024-08-08 | Disposition: A | Discharge status: Home / Self Care | Source: Ambulatory Visit | Attending: Nurse Practitioner | Admitting: Nurse Practitioner

## 2024-08-08 ENCOUNTER — Ambulatory Visit: Payer: Self-pay | Admitting: Nurse Practitioner

## 2024-08-08 ENCOUNTER — Other Ambulatory Visit (HOSPITAL_COMMUNITY)
Admission: RE | Admit: 2024-08-08 | Discharge: 2024-08-08 | Disposition: A | Discharge status: Home / Self Care | Source: Ambulatory Visit | Attending: Nurse Practitioner | Admitting: Nurse Practitioner

## 2024-08-08 ENCOUNTER — Encounter: Payer: Self-pay | Admitting: Nurse Practitioner

## 2024-08-08 ENCOUNTER — Ambulatory Visit: Admitting: Nurse Practitioner

## 2024-08-08 VITALS — BP 135/88 | Ht 63.0 in | Wt 220.1 lb

## 2024-08-08 DIAGNOSIS — R0602 Shortness of breath: Secondary | ICD-10-CM | POA: Insufficient documentation

## 2024-08-08 DIAGNOSIS — R7989 Other specified abnormal findings of blood chemistry: Secondary | ICD-10-CM | POA: Diagnosis not present

## 2024-08-08 DIAGNOSIS — W19XXXD Unspecified fall, subsequent encounter: Secondary | ICD-10-CM | POA: Diagnosis not present

## 2024-08-08 DIAGNOSIS — R071 Chest pain on breathing: Secondary | ICD-10-CM | POA: Insufficient documentation

## 2024-08-08 DIAGNOSIS — S2242XD Multiple fractures of ribs, left side, subsequent encounter for fracture with routine healing: Secondary | ICD-10-CM

## 2024-08-08 LAB — D-DIMER, QUANTITATIVE: D-Dimer, Quant: 0.99 ug{FEU}/mL — ABNORMAL HIGH (ref 0.00–0.50)

## 2024-08-08 MED ORDER — IOHEXOL 350 MG/ML SOLN
75.0000 mL | Freq: Once | INTRAVENOUS | Status: AC | PRN
  Administered 2024-08-08: 75 mL via INTRAVENOUS

## 2024-08-08 MED ORDER — PREDNISONE 20 MG PO TABS: ORAL_TABLET | ORAL | 0 refills | Status: DC

## 2024-08-08 MED ORDER — AMITRIPTYLINE HCL 25 MG PO TABS: 25.0000 mg | ORAL_TABLET | Freq: Every day | ORAL | 0 refills | Status: AC

## 2024-08-08 NOTE — Progress Notes (Unsigned)
   Subjective:    Patient ID: Jessica Kirby, female    DOB: 1956/07/04, 68 y.o.   MRN: [id number] CC: acute visit  HPI  68 year old, female arrives for right breast pain and left rib pain. She had a fall on 07/25/2024 in which she tripped over a case of water  at home and landed on her chest but did not hit her head. The left rib pain sometimes feels like a knot and the right breast pain radiates to her back. It has been a hard to lift her arms and put on her bra. Currently, wearing her bra is uncomfortable. In addition, she has been experiencing SOB with taking steps and walking. She went to [facility] on 10/28 and received an Xray- Normal and tizanidine - which provided minimal relief. Also, she has tried tylenol , heating applications, and ibuprofen with minimal relief.   Review of Systems  Constitutional:  Negative for fever.  HENT:  Negative for sore throat and trouble swallowing.   Respiratory:  Positive for shortness of breath. Negative for cough and wheezing.   Cardiovascular:  Negative for chest pain and leg swelling.  Gastrointestinal:  Negative for abdominal pain, constipation, diarrhea, nausea and vomiting.  Musculoskeletal:  Positive for myalgias.       Tenderness noted during palpation of bilateral rib cage.        Objective:   Physical Exam Constitutional:      General: She is not in acute distress.    Appearance: Normal appearance.  Cardiovascular:     Rate and Rhythm: Normal rate and regular rhythm.     Heart sounds: Normal heart sounds.  Pulmonary:     Effort: Pulmonary effort is normal.     Breath sounds: Normal breath sounds.  Chest:     Chest wall: Tenderness present.  Musculoskeletal:        General: Tenderness present.  Skin:    General: Skin is warm and dry.  Neurological:     Mental Status: She is alert and oriented to person, place, and time.  Psychiatric:        Mood and Affect: Mood normal.        Behavior: Behavior normal.   Breasts: breasts appear  normal, no suspicious masses, no skin or nipple changes or axillary nodes. Tenderness noted bilaterally.   Vitals:   08/08/24 0820  BP: 135/88  Height: 5' 3 (1.6 m)  Weight: 99.8 kg  BMI (Calculated): 39    02 taken at 843 am - 95%    Assessment & Plan:  1. Chest pain on breathing (Primary) Brace deep breathing with pillow compressed to chest for assistance with comfort.  Contact the office or go to the ED with any systemic symptoms such as fever, chills, increased chest pain, increased SOB. - D-dimer, quantitative - DG Chest 2 View - CT Angio Chest W/Cm &/Or Wo Cm  2. Short of breath on exertion - D-dimer, quantitative - DG Chest 2 View - CT Angio Chest W/Cm &/Or Wo Cm  3. Positive D dimer - CT Angio Chest W/Cm &/Or Wo Cm   Return in about 4 weeks (around 09/05/2024), or if symptoms worsen or fail to improve.

## 2024-08-11 ENCOUNTER — Encounter: Payer: Self-pay | Admitting: Nurse Practitioner

## 2024-08-11 DIAGNOSIS — W19XXXA Unspecified fall, initial encounter: Secondary | ICD-10-CM | POA: Insufficient documentation

## 2024-08-11 DIAGNOSIS — S2242XA Multiple fractures of ribs, left side, initial encounter for closed fracture: Secondary | ICD-10-CM | POA: Insufficient documentation

## 2024-08-14 ENCOUNTER — Telehealth: Payer: Self-pay | Admitting: Gastroenterology

## 2024-08-14 NOTE — Telephone Encounter (Signed)
 Patient called in and said she had an appointment for May but is having a issue now and wants to be seen.  At the moment there is nothing available until Jan for CM.  Pt states that she fell in October and a few ribs were giving her trouble so she was given steroids.  She has been bloated and has a lot of gas and feels that something is jacked up.  She also states she has trouble going to the bathroom.  Please advise.... [phone]

## 2024-08-15 ENCOUNTER — Encounter: Payer: Self-pay | Admitting: *Deleted

## 2024-08-16 ENCOUNTER — Other Ambulatory Visit: Payer: Self-pay | Admitting: Gastroenterology

## 2024-08-16 MED ORDER — PSYLLIUM FIBER 0.52 G PO CAPS: 2.0000 | ORAL_CAPSULE | Freq: Every day | ORAL | 0 refills | Status: AC

## 2024-08-16 MED ORDER — POLYETHYLENE GLYCOL 3350 17 GM/SCOOP PO POWD: 17.0000 g | Freq: Every day | ORAL | 1 refills | Status: AC

## 2024-08-16 NOTE — Telephone Encounter (Signed)
 Pt sent a MyChart message. Pt responded

## 2024-08-17 ENCOUNTER — Other Ambulatory Visit: Payer: Self-pay | Admitting: Family Medicine

## 2024-09-06 ENCOUNTER — Ambulatory Visit: Admitting: Family Medicine

## 2024-09-06 ENCOUNTER — Encounter: Payer: Self-pay | Admitting: Family Medicine

## 2024-09-06 VITALS — BP 153/84 | HR 81 | Temp 97.5°F | Ht 63.0 in | Wt 221.5 lb

## 2024-09-06 DIAGNOSIS — S2242XD Multiple fractures of ribs, left side, subsequent encounter for fracture with routine healing: Secondary | ICD-10-CM

## 2024-09-06 NOTE — Patient Instructions (Signed)
 Glad you're doing well.  See you in April.

## 2024-09-06 NOTE — Assessment & Plan Note (Addendum)
 Imaging reviewed.  Patient is doing well at this time with very little pain.  Advised continued use of Tylenol  as needed.  Supportive care.  Has follow-up with me in April.

## 2024-09-06 NOTE — Progress Notes (Signed)
 Subjective:  Patient ID: Jessica Kirby, female    DOB: 08-Mar-1956  Age: 68 y.o. MRN: [id number]  CC:   Chief Complaint  Patient presents with   Follow-up    Patient is here regarding her MRI, X-Ray and Blood Work results - per Elveria    HPI:  68 year old female presents for follow-up regarding recent fall.  Patient had a fall at the end of October.  She was subsequently seen on 11/11 by our office.  At that time she was experiencing right sided breast pain/chest wall pain and left rib pain.  D-dimer was obtained and was elevated.  She subsequently had a CT angiogram which was negative for PE but notable for rib fractures on the left side, 4 through 7.  Patient presents today for follow-up regarding this.  She states that she is not having significant pain at this time.  Uses Tylenol  as needed.  Overall she is improving.  No significant shortness of breath.  Patient Active Problem List   Diagnosis Date Noted   Multiple closed fractures of ribs of left side 08/11/2024   Fatigue 07/19/2024   Overactive bladder 08/11/2023   Chronic idiopathic constipation 10/15/2022   Hyperlipidemia 03/25/2022   Diabetes mellitus without complication (HCC) 11/28/2020   Anxiety 11/04/2015   GERD (gastroesophageal reflux disease) 09/27/2013   Hypertension    Osteopenia     Social Hx   Social History   Socioeconomic History   Marital status: Widowed    Spouse name: Not on file   Number of children: Not on file   Years of education: Not on file   Highest education level: Not on file  Occupational History   Not on file  Tobacco Use   Smoking status: Never    Passive exposure: Yes   Smokeless tobacco: Never  Vaping Use   Vaping status: Never Used  Substance and Sexual Activity   Alcohol use: No    Alcohol/week: 0.0 standard drinks of alcohol   Drug use: No   Sexual activity: Yes  Other Topics Concern   Not on file  Social History Narrative   Not on file   Social Drivers of Health    Financial Resource Strain: Low Risk  (05/14/2023)   Overall Financial Resource Strain (CARDIA)    Difficulty of Paying Living Expenses: Not hard at all  Food Insecurity: No Food Insecurity (05/14/2023)   Hunger Vital Sign    Worried About Running Out of Food in the Last Year: Never true    Ran Out of Food in the Last Year: Never true  Transportation Needs: No Transportation Needs (05/14/2023)   PRAPARE - Administrator, Civil Service (Medical): No    Lack of Transportation (Non-Medical): No  Physical Activity: Sufficiently Active (05/14/2023)   Exercise Vital Sign    Days of Exercise per Week: 7 days    Minutes of Exercise per Session: 30 min  Stress: No Stress Concern Present (05/14/2023)   [employer] of Occupational Health - Occupational Stress Questionnaire    Feeling of Stress : Not at all  Social Connections: Moderately Integrated (05/14/2023)   Social Connection and Isolation Panel    Frequency of Communication with Friends and Family: More than three times a week    Frequency of Social Gatherings with Friends and Family: More than three times a week    Attends Religious Services: More than 4 times per year    Active Member of [employer] or Organizations: Yes  Attends Banker Meetings: More than 4 times per year    Marital Status: Widowed    Review of Systems Per HPI  Objective:  BP (!) 153/84 (BP Location: Left Arm, Patient Position: Sitting)   Pulse 81   Temp (!) 97.5 F (36.4 C)   Ht 5' 3 (1.6 m)   Wt 221 lb 8 oz (100.5 kg)   SpO2 96%   BMI 39.24 kg/m      09/06/2024    9:03 AM 08/08/2024    8:20 AM 07/25/2024    3:15 PM  BP/Weight  Systolic BP 153 135 145  Diastolic BP 84 88 94  Wt. (Lbs) 221.5 220.08   BMI 39.24 kg/m2 38.99 kg/m2     Physical Exam Vitals and nursing note reviewed.  Constitutional:      Appearance: Normal appearance. She is obese.  Cardiovascular:     Rate and Rhythm: Normal rate and regular rhythm.   Pulmonary:     Effort: Pulmonary effort is normal.     Breath sounds: Normal breath sounds.  Chest:     Chest wall: No tenderness.  Neurological:     Mental Status: She is alert.     Lab Results  Component Value Date   WBC 10.6 07/19/2024   HGB 14.0 07/19/2024   HCT 43.3 07/19/2024   PLT 296 07/19/2024   GLUCOSE 110 (H) 07/19/2024   CHOL 184 07/19/2024   TRIG 160 (H) 07/19/2024   HDL 48 07/19/2024   LDLCALC 108 (H) 07/19/2024   ALT 21 07/19/2024   AST 23 07/19/2024   NA 143 07/19/2024   K 4.3 07/19/2024   CL 103 07/19/2024   CREATININE 0.74 07/19/2024   BUN 10 07/19/2024   CO2 24 07/19/2024   TSH 4.180 07/19/2024   INR 0.9 02/03/2022   HGBA1C 6.6 (H) 07/19/2024     Assessment & Plan:  Closed fracture of multiple ribs of left side with routine healing, subsequent encounter Assessment & Plan: Imaging reviewed.  Patient is doing well at this time with very little pain.  Advised continued use of Tylenol  as needed.  Supportive care.  Has follow-up with me in April.    Jacqulyn Ahle DO [facility]

## 2024-09-11 ENCOUNTER — Other Ambulatory Visit (HOSPITAL_COMMUNITY): Payer: Self-pay | Admitting: Family Medicine

## 2024-09-11 DIAGNOSIS — Z1231 Encounter for screening mammogram for malignant neoplasm of breast: Secondary | ICD-10-CM

## 2024-10-02 ENCOUNTER — Ambulatory Visit (HOSPITAL_COMMUNITY)
Admission: RE | Admit: 2024-10-02 | Discharge: 2024-10-02 | Disposition: A | Discharge status: Home / Self Care | Source: Ambulatory Visit | Attending: Family Medicine | Admitting: Family Medicine

## 2024-10-02 DIAGNOSIS — Z1231 Encounter for screening mammogram for malignant neoplasm of breast: Secondary | ICD-10-CM | POA: Diagnosis present

## 2024-10-04 ENCOUNTER — Other Ambulatory Visit: Payer: Self-pay | Admitting: Family Medicine

## 2024-10-04 ENCOUNTER — Other Ambulatory Visit: Payer: Self-pay

## 2024-10-04 ENCOUNTER — Telehealth: Payer: Self-pay | Admitting: Family Medicine

## 2024-10-04 DIAGNOSIS — R0981 Nasal congestion: Secondary | ICD-10-CM

## 2024-10-04 MED ORDER — FLUTICASONE PROPIONATE 50 MCG/ACT NA SUSP: 2.0000 | Freq: Every day | NASAL | 6 refills | Status: DC

## 2024-10-04 NOTE — Telephone Encounter (Signed)
 Refill has been sent in.

## 2024-10-04 NOTE — Telephone Encounter (Signed)
 Patient would like a prescription for Fluticasone  50 MCG/ACT   [facility]

## 2024-10-18 ENCOUNTER — Ambulatory Visit: Admitting: Family Medicine

## 2024-10-18 ENCOUNTER — Ambulatory Visit: Payer: Self-pay

## 2024-10-18 VITALS — BP 146/88 | HR 85 | Temp 99.0°F | Ht 63.0 in | Wt 222.8 lb

## 2024-10-18 DIAGNOSIS — E119 Type 2 diabetes mellitus without complications: Secondary | ICD-10-CM

## 2024-10-18 DIAGNOSIS — Z6839 Body mass index (BMI) 39.0-39.9, adult: Secondary | ICD-10-CM | POA: Diagnosis not present

## 2024-10-18 DIAGNOSIS — E66812 Obesity, class 2: Secondary | ICD-10-CM

## 2024-10-18 DIAGNOSIS — I10 Essential (primary) hypertension: Secondary | ICD-10-CM

## 2024-10-18 NOTE — Patient Instructions (Signed)
 A1c today.  Monitor sugars.  I will be in touch as soon as I can with the result.

## 2024-10-18 NOTE — Telephone Encounter (Signed)
 FYI Only or Action Required?: FYI only for provider: appointment scheduled on 10/18/2024 4:10 PMCook, Jayce G, [facility].  Patient was last seen in primary care on 09/06/2024 by Cook, Jayce G, DO.  Called Nurse Triage reporting Blood Sugar Problem and Urinary Frequency.  Symptoms began about a month ago.  Interventions attempted: Rest, hydration, or home remedies.  Symptoms are: stable.  Triage Disposition: See Within 3 Days in Office (overriding Home Care)  Patient/caregiver understands and will follow disposition?: Yes              Reason for Disposition  [1] Blood glucose 240 - 300 mg/dL (86.6 - 83.2 mmol/L) AND [2] does not use insulin (e.g., not insulin-dependent; most people with type 2 diabetes)  Answer Assessment - Initial Assessment Questions Outbound call to patient to discuss concerns. Patient reports 1 year or ago or so , was told didn't need to check blood sugar was doing good. Checks it once every while. Here lately checking and noticing runny high when its 250-270  (one reading above 300) after fasting  this morning fasting sugar 172.  Has Diabetes mellitus . Lancets and test strips  not expired. Maybe more carbs in diet. Took blood sugar  this morning 9 am after eating cereal and coffee was 208. Has been more tired. Sleeping more. Drinking water  not as much and more thristy and running to bathroom and drinking diet soda. Given symptoms 1 month and elevated reading recommended evaluation patient agreeable and wants to be seen today booked appointment 10/18/2024 4:10 PM Cook, Jayce G, DO [facility]     2. ONSET: When did you check the blood glucose?     In the last week , but hadn't checked much before this week and could be running high  3. USUAL RANGE: What is your glucose level usually? (e.g., usual fasting morning value, usual evening value)     Highest 110 or something like that   5. TYPE 1 or 2:  Do you know what type of  diabetes you have?  (e.g., Type 1, Type 2, Gestational; doesn't know)      Type II  6. INSULIN: Do you take insulin? What type of insulin(s) do you use? What is the mode of delivery? (syringe, pen; injection or pump)?      No insulin  7. DIABETES PILLS: Do you take any pills for your diabetes? If Yes, ask: Have you missed taking any pills recently?     No  8. OTHER SYMPTOMS: Do you have any symptoms? (e.g., fever, frequent urination, difficulty breathing, dizziness, weakness, vomiting)     Frequent urination, increased thirst , more tired     Patient denies the following chest pain, shortness of breath, vomiting  Protocols used: Diabetes - High Blood Sugar-A-AH Copied from CRM #[id number]. Topic: Clinical - Medical Advice >> Oct 18, 2024  9:25 AM Winona R wrote: Blood sugar increasing and pt is concerned  Time- Glucose 6am- 172  9am- 208   Call back [phone]

## 2024-10-18 NOTE — Assessment & Plan Note (Signed)
 BP mildly elevated here today.  Will continue to monitor.  No changes at this time.  Continue current medications.

## 2024-10-18 NOTE — Assessment & Plan Note (Addendum)
 Glycemic control worsening.  Patient endorsing dry eyes and recently elevated blood sugars.  A1c ordered.  We discussed starting medication versus waiting until A1c returns.  She elects to wait until A1c returns.  Advised healthy diet.  I also gave her 2 samples of freestyle libre to track her sugars.

## 2024-10-18 NOTE — Progress Notes (Signed)
 "  Subjective:  Patient ID: Jessica Kirby, female    DOB: 06/13/1956  Age: 69 y.o. MRN: [id number]  CC:   Chief Complaint  Patient presents with   Blood Sugar Problem    Pt states her blood sugars have been high, checked this morning before eating and was 172, ate cereal and checked around 9am and was 208. She had a eye exam recently and eye doctor suggested her be seen for her sugars due to dry eyes. Requesting new meter due to wondering if hers is not working  correctly. Pt is also c/o of feeling more tired lately and when she urinates in the morning, she sees white spots/clots in urine, sometimes throughout the day too, states she is always feeling thirsty.    HPI:  69 year old female presents for evaluation of the above.  Patient reports recent elevated blood sugars.  172 this morning.  208 after eating.  She went to the eye doctor today and he could not perform a full exam due to her having significantly dry eyes.  There is concern that this is related to type 2 diabetes.  Last A1c was 6.6.  She is able to get an A1c today.  She is not currently on any pharmacotherapy and has been managing with lifestyle.   BP also mildly elevated here today.  She endorses compliance with amlodipine  and metoprolol .  Patient Active Problem List   Diagnosis Date Noted   Obesity, morbid (HCC) 10/18/2024   Fatigue 07/19/2024   Overactive bladder 08/11/2023   Chronic idiopathic constipation 10/15/2022   Hyperlipidemia 03/25/2022   Diabetes mellitus without complication (HCC) 11/28/2020   Anxiety 11/04/2015   GERD (gastroesophageal reflux disease) 09/27/2013   Hypertension    Osteopenia     Social Hx   Social History   Socioeconomic History   Marital status: Widowed    Spouse name: Not on file   Number of children: Not on file   Years of education: Not on file   Highest education level: Not on file  Occupational History   Not on file  Tobacco Use   Smoking status: Never    Passive  exposure: Yes   Smokeless tobacco: Never  Vaping Use   Vaping status: Never Used  Substance and Sexual Activity   Alcohol use: No    Alcohol/week: 0.0 standard drinks of alcohol   Drug use: No   Sexual activity: Yes  Other Topics Concern   Not on file  Social History Narrative   Not on file   Social Drivers of Health   Tobacco Use: Medium Risk (09/06/2024)   Patient History    Smoking Tobacco Use: Never    Smokeless Tobacco Use: Never    Passive Exposure: Yes  Financial Resource Strain: Low Risk (05/14/2023)   Overall Financial Resource Strain (CARDIA)    Difficulty of Paying Living Expenses: Not hard at all  Food Insecurity: No Food Insecurity (05/14/2023)   Hunger Vital Sign    Worried About Running Out of Food in the Last Year: Never true    Ran Out of Food in the Last Year: Never true  Transportation Needs: No Transportation Needs (05/14/2023)   PRAPARE - Administrator, Civil Service (Medical): No    Lack of Transportation (Non-Medical): No  Physical Activity: Sufficiently Active (05/14/2023)   Exercise Vital Sign    Days of Exercise per Week: 7 days    Minutes of Exercise per Session: 30 min  Stress: No Stress Concern  Present (05/14/2023)   [employer] of Occupational Health - Occupational Stress Questionnaire    Feeling of Stress : Not at all  Social Connections: Moderately Integrated (05/14/2023)   Social Connection and Isolation Panel    Frequency of Communication with Friends and Family: More than three times a week    Frequency of Social Gatherings with Friends and Family: More than three times a week    Attends Religious Services: More than 4 times per year    Active Member of [employer] or Organizations: Yes    Attends Banker Meetings: More than 4 times per year    Marital Status: Widowed  Depression (PHQ2-9): Medium Risk (10/18/2024)   Depression (PHQ2-9)    PHQ-2 Score: 9  Alcohol Screen: Low Risk (05/14/2023)   Alcohol Screen     Last Alcohol Screening Score (AUDIT): 0  Housing: Low Risk (05/14/2023)   Housing    Last Housing Risk Score: 0  Utilities: Not At Risk (05/14/2023)   AHC Utilities    Threatened with loss of utilities: No  Health Literacy: Adequate Health Literacy (05/14/2023)   B1300 Health Literacy    Frequency of need for help with medical instructions: Never    Review of Systems Per HPI  Objective:  BP (!) 146/88   Pulse 85   Temp 99 F (37.2 C)   Ht 5' 3 (1.6 m)   Wt 222 lb 12.8 oz (101.1 kg)   SpO2 95%   BMI 39.47 kg/m      10/18/2024    4:25 PM 10/18/2024    4:00 PM 09/06/2024    9:03 AM  BP/Weight  Systolic BP 146 167 153  Diastolic BP 88 76 84  Wt. (Lbs)  222.8 221.5  BMI  39.47 kg/m2 39.24 kg/m2    Physical Exam Vitals and nursing note reviewed.  Constitutional:      General: She is not in acute distress.    Appearance: Normal appearance.  HENT:     Head: Normocephalic and atraumatic.  Cardiovascular:     Rate and Rhythm: Normal rate and regular rhythm.  Pulmonary:     Effort: Pulmonary effort is normal.     Breath sounds: Normal breath sounds.  Neurological:     Mental Status: She is alert.  Psychiatric:        Mood and Affect: Mood normal.        Behavior: Behavior normal.     Lab Results  Component Value Date   WBC 10.6 07/19/2024   HGB 14.0 07/19/2024   HCT 43.3 07/19/2024   PLT 296 07/19/2024   GLUCOSE 110 (H) 07/19/2024   CHOL 184 07/19/2024   TRIG 160 (H) 07/19/2024   HDL 48 07/19/2024   LDLCALC 108 (H) 07/19/2024   ALT 21 07/19/2024   AST 23 07/19/2024   NA 143 07/19/2024   K 4.3 07/19/2024   CL 103 07/19/2024   CREATININE 0.74 07/19/2024   BUN 10 07/19/2024   CO2 24 07/19/2024   TSH 4.180 07/19/2024   INR 0.9 02/03/2022   HGBA1C 6.6 (H) 07/19/2024     Assessment & Plan:  Diabetes mellitus without complication (HCC) Assessment & Plan: Glycemic control worsening.  Patient endorsing dry eyes and recently elevated blood sugars.  A1c  ordered.  We discussed starting medication versus waiting until A1c returns.  She elects to wait until A1c returns.  Advised healthy diet.  I also gave her 2 samples of freestyle libre to track her sugars.  Orders: -  Hemoglobin A1c  Obesity, morbid (HCC)    Follow-up:  No follow-ups on file.  Jacqulyn Ahle DO [facility] "

## 2024-10-19 LAB — HEMOGLOBIN A1C
Est. average glucose Bld gHb Est-mCnc: 171 mg/dL
Hgb A1c MFr Bld: 7.6 % — ABNORMAL HIGH (ref 4.8–5.6)

## 2024-10-20 ENCOUNTER — Telehealth: Payer: Self-pay

## 2024-10-20 NOTE — Telephone Encounter (Signed)
 Copied from CRM #[id number]. Topic: Clinical - Lab/Test Results >> Oct 19, 2024  3:32 PM Amber H wrote: Reason for CRM: Patient calling for update on lab results from yesterday. I did advise to her that they were just received this morning and waiting on Dr. Bluford to view results. She is requesting a call back. Her Mychart is not working properly.   [phone]

## 2024-10-22 ENCOUNTER — Other Ambulatory Visit: Payer: Self-pay | Admitting: Family Medicine

## 2024-10-22 ENCOUNTER — Ambulatory Visit: Payer: Self-pay | Admitting: Family Medicine

## 2024-10-22 MED ORDER — METFORMIN HCL 500 MG PO TABS: 500.0000 mg | ORAL_TABLET | Freq: Two times a day (BID) | ORAL | 3 refills | Status: AC

## 2024-10-24 NOTE — Telephone Encounter (Signed)
 Pt has been made aware per provider notes and recommendations.

## 2025-01-17 ENCOUNTER — Ambulatory Visit: Admitting: Family Medicine

## 2025-02-22 ENCOUNTER — Ambulatory Visit: Admitting: Gastroenterology

## 2025-05-25 ENCOUNTER — Ambulatory Visit
